# Patient Record
Sex: Female | Born: 1935
Health system: Southern US, Community
[De-identification: ages and names within clinical notes are randomized; demographics above are authoritative.]

## PROBLEM LIST (undated history)

## (undated) DIAGNOSIS — I471 Supraventricular tachycardia, unspecified: Secondary | ICD-10-CM

## (undated) DIAGNOSIS — R600 Localized edema: Secondary | ICD-10-CM

## (undated) DIAGNOSIS — G4733 Obstructive sleep apnea (adult) (pediatric): Secondary | ICD-10-CM

## (undated) DIAGNOSIS — G4730 Sleep apnea, unspecified: Secondary | ICD-10-CM

## (undated) DIAGNOSIS — R0609 Other forms of dyspnea: Secondary | ICD-10-CM

## (undated) DIAGNOSIS — R609 Edema, unspecified: Secondary | ICD-10-CM

## (undated) DIAGNOSIS — I700 Atherosclerosis of aorta: Secondary | ICD-10-CM

## (undated) DIAGNOSIS — I38 Endocarditis, valve unspecified: Secondary | ICD-10-CM

## (undated) DIAGNOSIS — I2510 Atherosclerotic heart disease of native coronary artery without angina pectoris: Secondary | ICD-10-CM

## (undated) DIAGNOSIS — I480 Paroxysmal atrial fibrillation: Secondary | ICD-10-CM

## (undated) DIAGNOSIS — I779 Disorder of arteries and arterioles, unspecified: Secondary | ICD-10-CM

## (undated) DIAGNOSIS — K2270 Barrett's esophagus without dysplasia: Secondary | ICD-10-CM

## (undated) DIAGNOSIS — H3530 Unspecified macular degeneration: Secondary | ICD-10-CM

## (undated) DIAGNOSIS — M419 Scoliosis, unspecified: Secondary | ICD-10-CM

## (undated) DIAGNOSIS — M1990 Unspecified osteoarthritis, unspecified site: Secondary | ICD-10-CM

## (undated) DIAGNOSIS — I10 Essential (primary) hypertension: Secondary | ICD-10-CM

## (undated) DIAGNOSIS — K219 Gastro-esophageal reflux disease without esophagitis: Secondary | ICD-10-CM

## (undated) DIAGNOSIS — N189 Chronic kidney disease, unspecified: Secondary | ICD-10-CM

## (undated) DIAGNOSIS — D649 Anemia, unspecified: Secondary | ICD-10-CM

## (undated) DIAGNOSIS — D126 Benign neoplasm of colon, unspecified: Secondary | ICD-10-CM

## (undated) DIAGNOSIS — K222 Esophageal obstruction: Secondary | ICD-10-CM

## (undated) DIAGNOSIS — J449 Chronic obstructive pulmonary disease, unspecified: Secondary | ICD-10-CM

## (undated) DIAGNOSIS — K449 Diaphragmatic hernia without obstruction or gangrene: Secondary | ICD-10-CM

## (undated) DIAGNOSIS — I4891 Unspecified atrial fibrillation: Secondary | ICD-10-CM

## (undated) DIAGNOSIS — E785 Hyperlipidemia, unspecified: Secondary | ICD-10-CM

## (undated) DIAGNOSIS — R0600 Dyspnea, unspecified: Secondary | ICD-10-CM

## (undated) DIAGNOSIS — Z7901 Long term (current) use of anticoagulants: Secondary | ICD-10-CM

## (undated) DIAGNOSIS — N183 Chronic kidney disease, stage 3 unspecified: Secondary | ICD-10-CM

## (undated) DIAGNOSIS — I739 Peripheral vascular disease, unspecified: Secondary | ICD-10-CM

## (undated) HISTORY — DX: Peripheral vascular disease, unspecified: I73.9

## (undated) HISTORY — PX: HEMORRHOID SURGERY: SHX153

## (undated) HISTORY — PX: COLONOSCOPY: SHX174

## (undated) HISTORY — PX: CARDIAC CATHETERIZATION: SHX172

## (undated) HISTORY — PX: TONSILLECTOMY: SUR1361

## (undated) HISTORY — DX: Hyperlipidemia, unspecified: E78.5

## (undated) HISTORY — DX: Sleep apnea, unspecified: G47.30

---

## 1978-02-05 HISTORY — PX: APPENDECTOMY: SHX54

## 1978-02-05 HISTORY — PX: ABDOMINAL HYSTERECTOMY: SHX81

## 1997-02-05 HISTORY — PX: LUNG BIOPSY: SHX232

## 1998-02-05 HISTORY — PX: BREAST SURGERY: SHX581

## 1999-09-27 ENCOUNTER — Encounter: Payer: Self-pay | Admitting: Thoracic Surgery

## 1999-09-27 ENCOUNTER — Encounter: Admission: RE | Admit: 1999-09-27 | Discharge: 1999-09-27 | Payer: Self-pay | Admitting: Thoracic Surgery

## 2000-09-25 ENCOUNTER — Encounter: Payer: Self-pay | Admitting: Thoracic Surgery

## 2000-09-25 ENCOUNTER — Encounter: Admission: RE | Admit: 2000-09-25 | Discharge: 2000-09-25 | Payer: Self-pay | Admitting: Thoracic Surgery

## 2001-09-17 ENCOUNTER — Encounter: Admission: RE | Admit: 2001-09-17 | Discharge: 2001-09-17 | Payer: Self-pay | Admitting: Thoracic Surgery

## 2001-09-17 ENCOUNTER — Encounter: Payer: Self-pay | Admitting: Thoracic Surgery

## 2002-10-14 ENCOUNTER — Encounter: Admission: RE | Admit: 2002-10-14 | Discharge: 2002-10-14 | Payer: Self-pay | Admitting: Thoracic Surgery

## 2002-10-14 ENCOUNTER — Encounter: Payer: Self-pay | Admitting: Thoracic Surgery

## 2003-10-14 ENCOUNTER — Encounter: Admission: RE | Admit: 2003-10-14 | Discharge: 2003-10-14 | Payer: Self-pay | Admitting: Thoracic Surgery

## 2004-02-06 HISTORY — PX: CORONARY ARTERY BYPASS GRAFT: SHX141

## 2004-04-05 DIAGNOSIS — Z951 Presence of aortocoronary bypass graft: Secondary | ICD-10-CM

## 2004-04-05 HISTORY — DX: Presence of aortocoronary bypass graft: Z95.1

## 2004-04-20 ENCOUNTER — Inpatient Hospital Stay: Payer: Self-pay | Admitting: Internal Medicine

## 2004-04-21 ENCOUNTER — Inpatient Hospital Stay (HOSPITAL_COMMUNITY): Admission: AD | Admit: 2004-04-21 | Discharge: 2004-04-29 | Payer: Self-pay | Admitting: *Deleted

## 2004-04-21 DIAGNOSIS — I2510 Atherosclerotic heart disease of native coronary artery without angina pectoris: Secondary | ICD-10-CM

## 2004-04-21 HISTORY — DX: Atherosclerotic heart disease of native coronary artery without angina pectoris: I25.10

## 2004-05-25 ENCOUNTER — Encounter: Admission: RE | Admit: 2004-05-25 | Discharge: 2004-05-25 | Payer: Self-pay | Admitting: Cardiothoracic Surgery

## 2004-06-13 ENCOUNTER — Encounter: Payer: Self-pay | Admitting: Internal Medicine

## 2004-07-06 ENCOUNTER — Encounter: Payer: Self-pay | Admitting: Internal Medicine

## 2004-08-02 ENCOUNTER — Ambulatory Visit: Payer: Self-pay | Admitting: Internal Medicine

## 2004-08-05 ENCOUNTER — Encounter: Payer: Self-pay | Admitting: Internal Medicine

## 2004-10-17 ENCOUNTER — Encounter: Admission: RE | Admit: 2004-10-17 | Discharge: 2004-10-17 | Payer: Self-pay | Admitting: Thoracic Surgery

## 2005-04-17 ENCOUNTER — Encounter: Admission: RE | Admit: 2005-04-17 | Discharge: 2005-04-17 | Payer: Self-pay | Admitting: Thoracic Surgery

## 2005-11-07 ENCOUNTER — Encounter: Admission: RE | Admit: 2005-11-07 | Discharge: 2005-11-07 | Payer: Self-pay | Admitting: Thoracic Surgery

## 2005-11-13 ENCOUNTER — Ambulatory Visit: Payer: Self-pay | Admitting: Family Medicine

## 2005-12-19 ENCOUNTER — Ambulatory Visit: Payer: Self-pay | Admitting: Gastroenterology

## 2005-12-19 DIAGNOSIS — K5790 Diverticulosis of intestine, part unspecified, without perforation or abscess without bleeding: Secondary | ICD-10-CM | POA: Insufficient documentation

## 2006-11-13 ENCOUNTER — Encounter: Admission: RE | Admit: 2006-11-13 | Discharge: 2006-11-13 | Payer: Self-pay | Admitting: Thoracic Surgery

## 2006-11-13 ENCOUNTER — Ambulatory Visit: Payer: Self-pay | Admitting: Thoracic Surgery

## 2007-01-14 ENCOUNTER — Ambulatory Visit: Payer: Self-pay | Admitting: Gastroenterology

## 2007-03-09 IMAGING — CR DG CHEST 1V PORT
1 series · 1 of 1 positions shown · non-contrast
Comparison: 04/24/04.

CLINICAL DATA: Status post CABG.
 CHEST PORTABLE ? 1 VIEW, 04/25/04 AT 1771 HOURS:

[view not recorded]
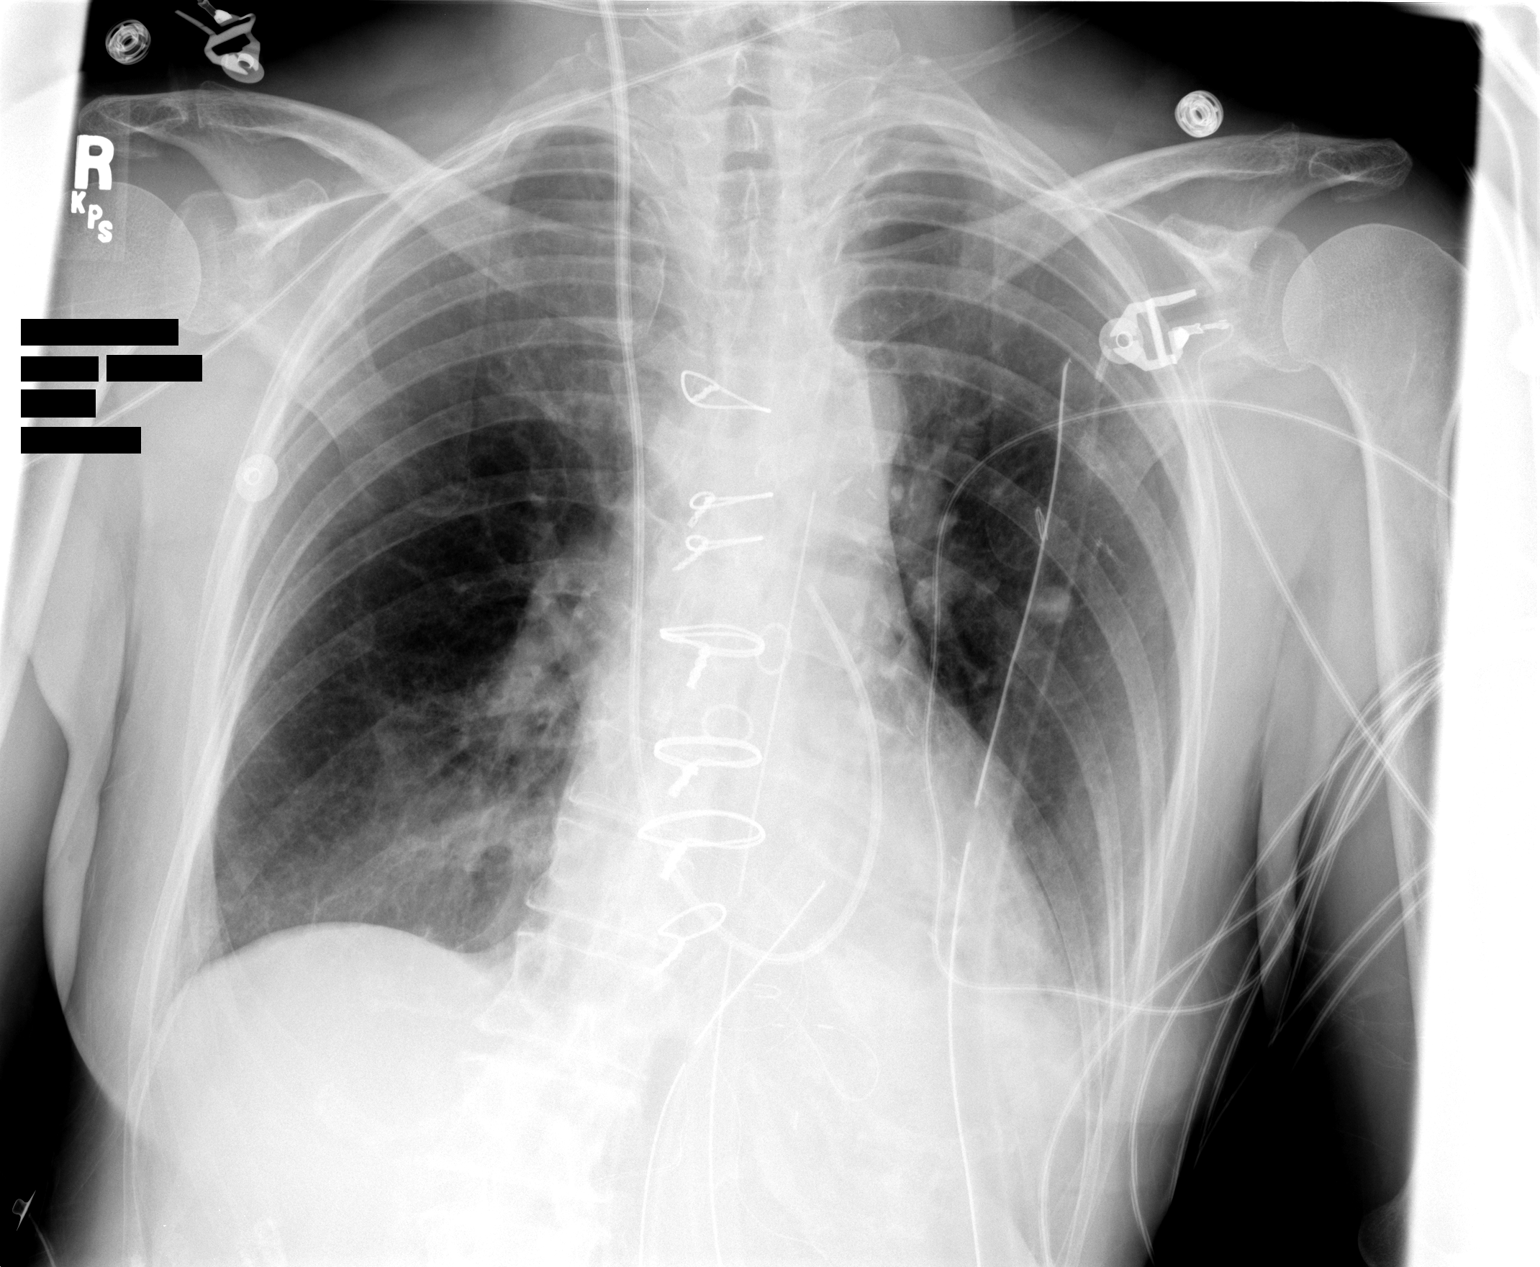

[1 of 1 positions shown; findings below may reference images not displayed]

The patient has been extubated.  There is interval decrease in left lower lobe atelectasis.  Left chest tube is in place.  There likely is a tiny less than 5% left apical pneumothorax present.  Swan-Ganz catheter position is stable.
IMPRESSION: Decrease in left lower lobe atelectasis.  Probable tiny less than 5% left apical pneumothorax.

## 2007-03-10 IMAGING — CR DG CHEST 1V PORT
1 series · 1 of 1 positions shown · non-contrast
Comparison: 04/25/04.

CLINICAL DATA: Unstable angina--post CABG.  
 CHEST PORTABLE 1 VIEW:

[view not recorded]
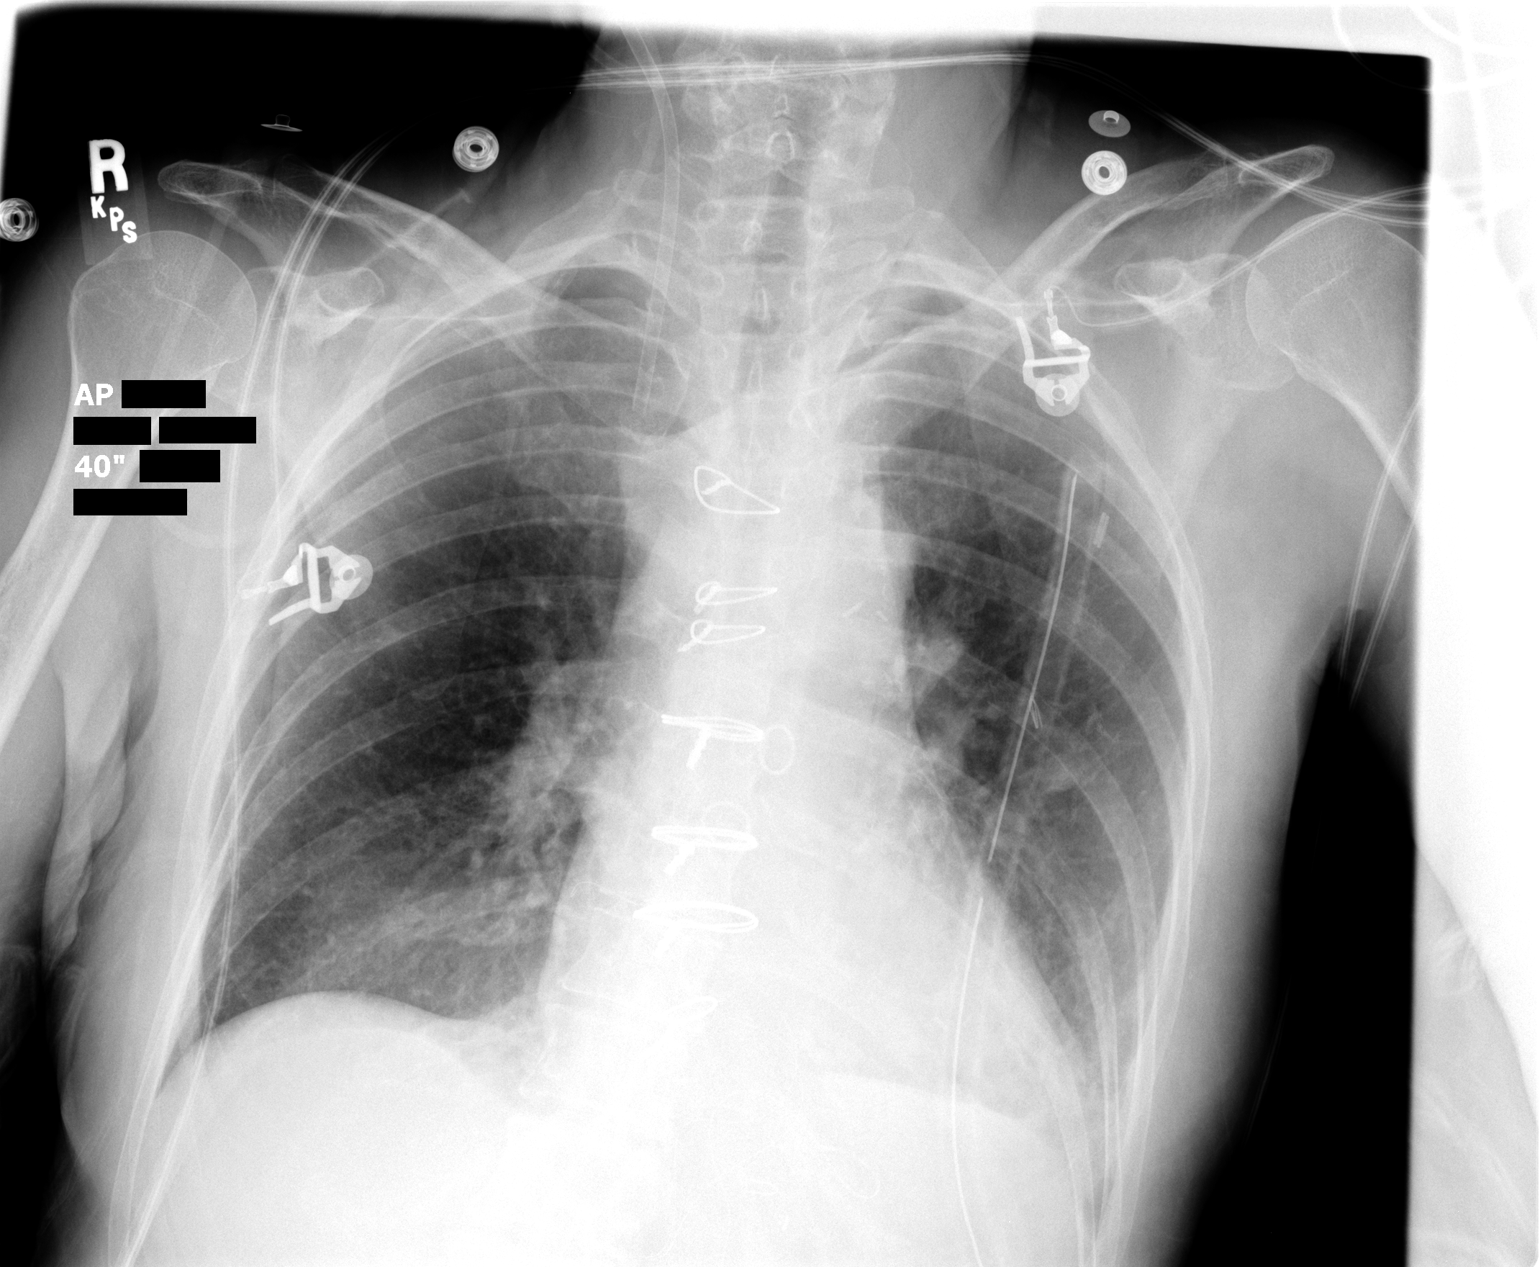

[1 of 1 positions shown; findings below may reference images not displayed]

Papwla Miziri and midline drains removed.  Left chest tube remains in place.  No definite pneumothorax.  Mild bibasilar atelectasis.
IMPRESSION: Satisfactory postoperative chest x-ray--currently no pneumothorax.

## 2007-11-13 ENCOUNTER — Ambulatory Visit: Payer: Self-pay | Admitting: Thoracic Surgery

## 2007-11-13 ENCOUNTER — Encounter: Admission: RE | Admit: 2007-11-13 | Discharge: 2007-11-13 | Payer: Self-pay | Admitting: Thoracic Surgery

## 2008-11-17 ENCOUNTER — Ambulatory Visit: Payer: Self-pay | Admitting: Thoracic Surgery

## 2008-11-17 ENCOUNTER — Encounter: Admission: RE | Admit: 2008-11-17 | Discharge: 2008-11-17 | Payer: Self-pay | Admitting: Thoracic Surgery

## 2009-03-07 ENCOUNTER — Ambulatory Visit: Payer: Self-pay | Admitting: Anesthesiology

## 2010-02-01 ENCOUNTER — Ambulatory Visit: Payer: Self-pay | Admitting: Unknown Physician Specialty

## 2010-02-10 ENCOUNTER — Ambulatory Visit: Payer: Self-pay | Admitting: Unknown Physician Specialty

## 2010-02-25 ENCOUNTER — Encounter: Payer: Self-pay | Admitting: Thoracic Surgery

## 2010-02-26 ENCOUNTER — Encounter: Payer: Self-pay | Admitting: Thoracic Surgery

## 2010-05-19 ENCOUNTER — Encounter (HOSPITAL_COMMUNITY): Payer: Self-pay | Admitting: Radiology

## 2010-05-19 ENCOUNTER — Emergency Department (HOSPITAL_COMMUNITY)
Admission: EM | Admit: 2010-05-19 | Discharge: 2010-05-19 | Disposition: A | Discharge status: Home / Self Care | Payer: Medicare Other | Attending: Emergency Medicine | Admitting: Emergency Medicine

## 2010-05-19 ENCOUNTER — Emergency Department (HOSPITAL_COMMUNITY): Payer: Medicare Other

## 2010-05-19 DIAGNOSIS — I10 Essential (primary) hypertension: Secondary | ICD-10-CM | POA: Insufficient documentation

## 2010-05-19 DIAGNOSIS — R05 Cough: Secondary | ICD-10-CM | POA: Insufficient documentation

## 2010-05-19 DIAGNOSIS — R6889 Other general symptoms and signs: Secondary | ICD-10-CM | POA: Insufficient documentation

## 2010-05-19 DIAGNOSIS — J029 Acute pharyngitis, unspecified: Secondary | ICD-10-CM | POA: Insufficient documentation

## 2010-05-19 DIAGNOSIS — R059 Cough, unspecified: Secondary | ICD-10-CM | POA: Insufficient documentation

## 2010-05-19 HISTORY — DX: Essential (primary) hypertension: I10

## 2010-05-19 LAB — DIFFERENTIAL
Basophils Relative: 0 % (ref 0–1)
Eosinophils Absolute: 0.2 10*3/uL (ref 0.0–0.7)
Monocytes Relative: 9 % (ref 3–12)
Neutrophils Relative %: 77 % (ref 43–77)

## 2010-05-19 LAB — BASIC METABOLIC PANEL
Calcium: 8.8 mg/dL (ref 8.4–10.5)
Chloride: 104 mEq/L (ref 96–112)
Creatinine, Ser: 1.15 mg/dL (ref 0.4–1.2)
GFR calc Af Amer: 56 mL/min — ABNORMAL LOW (ref >60)

## 2010-05-19 LAB — RAPID STREP SCREEN (MED CTR MEBANE ONLY): Streptococcus, Group A Screen (Direct): NEGATIVE

## 2010-05-19 LAB — CBC
MCH: 33.9 pg (ref 26.0–34.0)
Platelets: 215 10*3/uL (ref 150–400)
RBC: 3.66 MIL/uL — ABNORMAL LOW (ref 3.87–5.11)

## 2010-05-19 MED ORDER — IOHEXOL 300 MG/ML  SOLN: 75.0000 mL | Freq: Once | INTRAMUSCULAR | Status: DC | PRN

## 2010-06-20 NOTE — Letter (Signed)
November 17, 2008   Lorie Phenix, MD  [address], [address]  [place], [place] [address]   Re:  Christina [name], Christina [name]                 DOB:  1935-09-30   Dear Dr. Elease Hashimoto:   I saw the patient for followup today.  Her chest x-ray showed no  evidence of any changes.  She is doing well overall.  The one granuloma  in the left upper lobe is unchanged and has been that way for several  years.  She is also stable from a cardiac standpoint.  Her blood  pressure is 135/81, pulse 70, respirations 16, sats were 95%.  We  enjoyed taking care of the patient, but we have to see her again if she  develops any further pulmonary problems.   Ines Bloomer, M.D.  Electronically Signed   DPB/MEDQ  D:  11/17/2008  T:  11/18/2008  Job:  [id number]

## 2010-06-20 NOTE — Assessment & Plan Note (Signed)
OFFICE VISIT   KORAL, THADEN  DOB:  06-26-1935                                        November 13, 2007  CHART #:  [id number]   She has been stable on all the medical problems since then.  We reviewed  her medical list.  Blood pressure 140/80, pulse 90, respirations 18, and  sats were 94%.  Chest x-ray showed a stable 12-mm left lower lobe nodule  that is granuloma-like element that we removed 14 years ago.  She also  has some mild cardiomegaly and a hiatal hernia.  I will see her again in  1 year with another chest x-ray and that should be the last followup.   Ines Bloomer, M.D.  Electronically Signed   DPB/MEDQ  D:  11/13/2007  T:  11/13/2007  Job:  [id number]

## 2010-06-20 NOTE — Letter (Signed)
November 13, 2006   Dorothyann Peng, M.D.  [address]  [address]  [place], [place]  [address]   Re:  Christina [name], Christina [name]                 DOB:  18-Aug-1935   Dear Dr.Callwood:   I saw [name] back for follow up of her left upper lobe nodule.  She is doing well. There is no change in her chest x-ray. Apparently,  her cardiac status is stable and her recent heart evaluation was also  good. Her blood pressure is 132/78, pulse 78, respirations 18,  saturations 96%, and I plan to see her back again in a year with another  chest x-ray. Her lungs were clear to auscultation and percussion.   Ines Bloomer, M.D.  Electronically Signed   DPB/MEDQ  D:  11/13/2006  T:  11/14/2006  Job:  [id number]

## 2010-06-23 NOTE — Consult Note (Signed)
NAME:  Christina [name], Christina [name]           ACCOUNT NO.:  [id number]   MEDICAL RECORD NO.:  [id number]          PATIENT TYPE:  INP   LOCATION:  2920                         FACILITY:  [facility]   PHYSICIAN:  Salvatore Decent. Cornelius Moras, M.D. DATE OF BIRTH:  04/10/1935   DATE OF CONSULTATION:  04/22/2004  DATE OF DISCHARGE:                                   CONSULTATION   REFERRING PHYSICIAN:  Darlin Priestly, M.D.   PHYSICIANS:  1.  Dorothyann Peng, M.D.  2.  Lorie Phenix, M.D.   REASON FOR CONSULTATION:  Severe three-vessel coronary artery disease with  class IV unstable angina.   HISTORY OF PRESENT ILLNESS:  Christina [name] is a 75 year old married white  female from [place] with no previous history of coronary artery disease  but risk factors notable for history of hyperlipidemia, longstanding tobacco  abuse, strong family history of coronary artery disease, and recently  diagnosed hypertension. The patient states that she has been in her usual  state of health until the last few months. She has developed worsening  exertional fatigue. Over the last week or so, she developed some pressure-  like substernal chest pain that occasionally radiates to her arm. Initially,  this seemed to be associated with physical activity and relieved by rest.  The symptoms have accelerated frequency, and the patient subsequently  developed some episodes occurring at rest. The chest pain is not associated  with shortness of breath, nausea or diaphoresis. It is described as a  pressure-like pain in the mid chest radiating across the left side. She  initially presented to the emergency room at [facility] in [place]. She was admitted to the hospital and ruled out for  acute myocardial infarction. The patient was taken for cardiac  catheterization by Dr. Dorothyann Peng on March17, 2006. Findings at the  time of catheterization demonstrates severe three-vessel coronary artery  disease with  normal left ventricular function. Christina [name] was  subsequently transferred to [facility] for further  management. She was evaluated again by Dr. Lenise Herald and her coronary  anatomy is felt to be relatively unfavorable for percutaneous coronary  intervention. Cardiac surgical consultation has been requested.   REVIEW OF SYSTEMS:  GENERAL:  The patient reports feeling well with  exception of progressive exertional fatigue in recent months. She states  that she has an excellent appetite, although she has lost weight recently.  She apparently has lost between five and seven pounds over the last six  months to a year. She has always been relatively thin. CARDIOVASCULAR:  Notable for symptoms of angina as described. The patient denies resting  shortness of breath, PND, orthopnea, or lower extremity edema. She has not  had palpitations or syncope. RESPIRATORY:  Negative. The patient denies  productive cough, hemoptysis, wheezing. GASTROINTESTINAL:  Negative. The  patient has no difficulty swallowing. She denies symptoms of reflux. She  reports normal bowel function, although she has not had bowel movement thus  far since hospital admission. She denies history of hematochezia,  hematemesis, melena. MUSCULOSKELETAL: Notable for chronic problems with  arthritis and arthralgias particularly afflicting her  hands and occasionally  her lower back. NEUROLOGICAL:  Negative. The patient denies symptoms  suggestive of previous TIA or stroke. GENITOURINARY:  Negative. INFECTIOUS:  Negative. HEENT: Negative. The patient wears glasses. She has partial  dentures. She has no loose teeth or other problems. PSYCHIATRIC:  Negative.   PAST MEDICAL HISTORY:  1.  Hypertension, recently diagnosed.  2.  Osteoarthritis.  3.  Longstanding tobacco abuse.  4.  Iron deficient anemia.  5.  History of benign pulmonary nodules, followed by Dr. Ines Bloomer.  6.  Hyperlipidemia.   PAST  SURGICAL HISTORY:  1.  Left VATs by Dr. Ines Bloomer in 1995 for resection of benign      pulmonary nodule.  2.  Hysterectomy.  3.  Multiple breast biopsies.   FAMILY HISTORY:  The patient has numerous family members with coronary  artery disease. Her father died of myocardial infarction in his 44s and his  mother suffered a myocardial infarction in her 57s.   SOCIAL HISTORY:  The patient is married and lives with her husband in  [place]. 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 The patient has a longstanding history of tobacco use smoking  between one half to one pack of cigarettes per day for more than 50 years.  She denies history of significant alcohol consumption.   MEDICATIONS PRIOR TO ADMISSION:  1.  Etodolac 400 mg twice daily.  2.  Enteric coated aspirin 81 mg daily.  3.  Calcium supplement.  4.  Multivitamin.  5.  Iron.  6.  Vitamin C.   ALLERGIES:  CODEINE and SULFA both cause nausea.   PHYSICAL EXAMINATION:  GENERAL:  The patient is a thin well-appearing white  female who appears stated age in no acute distress.  VITAL SIGNS:  She is currently afebrile and normotensive and in normal sinus  rhythm.  HEENT: Exam is grossly unremarkable.  NECK:  The neck is supple. There is no cervical or supraclavicular  lymphadenopathy. No jugular venous distension. No carotid bruits noted.  CHEST:  Auscultation of the chest reveals clear and symmetrical breath  sounds bilaterally. No wheezes or rhonchi demonstrated.  CARDIOVASCULAR: Exam includes regular rate and rhythm. No murmurs, rubs or  gallops are noted.  ABDOMEN:  The abdomen is flat, soft, nontender. There are no palpable  masses. Bowel sounds present. EXTREMITIES: Warm and well-perfused. There are  no lower extremity edema. There is no sign of significant venous  insufficiency. Distal pulses are diminished but palpable in the dorsalis  pedis position.  RECTAL:   Deferred. GENITOURINARY:  Deferred.  NEUROLOGIC: Examination is grossly nonfocal and symmetrical throughout.   LABORATORY DATA:  Baseline blood work is notable for hemoglobin 11.4 with  hematocrit of 31.9%, platelet count 101,000. White blood count 6900. Serum  electrolytes are within normal limits and baseline serum creatinine 1.0.  Liver function profile is within normal limits and baseline coagulation  profile was normal.   Cardiac catheterization performed by Dr. Sanda Linger on March17, 2006 is  reviewed. This demonstrates severe three-vessel coronary artery disease with  normal left ventricular function. Specifically, there is heavy calcification  involving the proximal left anterior descending coronary artery and the  proximal left circumflex coronary artery. There is 80% proximal stenosis of  the left anterior descending coronary artery. This is a long complex lesion  with heavy calcification. There is 70-80% proximal stenosis of left  circumflex coronary artery. There is 75% ostial stenosis of the right  coronary  artery with long segment 50% proximal stenosis of the right  coronary artery. There is right dominant coronary circulation. Left  ventricular function appears normal with ejection fraction estimated 65%.   IMPRESSION:  Severe three-vessel coronary artery disease with class IV  unstable angina and normal left ventricular function. I believe that Mrs.  Horan would best be treated by elective coronary artery bypass grafting.   PLAN:  I have outlined options at length with Mrs. Berdan and husband.  Alternative treatment strategies have been discussed. They understand and  accept all associated risks of surgery including but not limited to risk of  death, stroke, myocardial infarction, congestive heart failure, respiratory  failure, pneumonia, bleeding requiring blood transfusion, arrhythmia,  infection, and recurrent coronary artery disease. All the questions been   addressed. We tentatively plan to proceed with surgery later this week.      CHO/MEDQ  D:  04/22/2004  T:  04/22/2004  Job:  [id number]   cc:   Darlin Priestly, MD  [address], [address]  [place]  [place] [address]  Fax: [phone]   Bufford Lope  [address]  [place]  [place] [address]  Fax: [phone]   Evonnie Pat, M.D.

## 2010-06-23 NOTE — Discharge Summary (Signed)
NAME:  Christina [name], Christina [name] NO.:  [id number]   MEDICAL RECORD NO.:  [id number]          PATIENT TYPE:  INP   LOCATION:  2007                         FACILITY:  [facility]   PHYSICIAN:  Sheliah Plane, MD    DATE OF BIRTH:  1935-02-23   DATE OF ADMISSION:  04/21/2004  DATE OF DISCHARGE:  04/27/2004                                 DISCHARGE SUMMARY   ADMISSION DATE:  Severe three-vessel coronary artery disease with class IV  unstable angina.   PAST MEDICAL HISTORY/DISCHARGE DIAGNOSES:  1.  Hypertension.  2.  Osteoarthritis.  3.  Long-standing tobacco abuse.  4.  Iron deficiency anemia.  5.  History of benign pulmonary nodules followed by Dr. Ines Bloomer.  6.  Hyperlipidemia.  7.  Newly diagnosed adult-onset diabetes mellitus.  8.  Coronary artery disease status post coronary artery bypass grafting x3.  9.  Status post left VATS by Dr. Edwyna Shell in 1995 for resection of benign      pulmonary nodules.  10. Status post hysterectomy.  11. Multiple breast biopsies.   ALLERGIES:  CODEINE and SULFA, both of which cause nausea.   HISTORY OF PRESENT ILLNESS:  The patient is a 75 year old Caucasian female  with no previous history of coronary artery disease but multiple risk  factors.  The patient presented to [facility] on April 21, 2004 with angina-like symptoms.  The patient was admitted and ruled out  for acute myocardial infarction.  She was taken for a cardiac  catheterization by Dr. Dorothyann Peng on April 21, 2004.  Cardiac  catheterization revealed severe three-vessel coronary artery disease with a  normal left ventricular function.  The patient was then transferred to [facility] for further management of her coronary artery disease and  possible arthroplasty.   HOSPITAL COURSE:  The patient was admitted to [facility] on April 21, 2004 for further management of her newly diagnosed three-vessel coronary  artery disease as  previously stated.  After review of her films it was Dr.  Mikey Bussing opinion that the patient would be better treated with coronary  artery bypass grafting.  The CVT'S service was subsequently consulted  regarding surgical revascularization.  After review of the patient and her  films it was Dr. Orvan July opinion that the patient should proceed with  coronary artery bypass graft surgery.   The patient was maintained on routine hospital care prior to surgery.  The  patient was taken to the OR on April 24, 2004 for coronary artery bypass  grafting x3.  The left internal mammary artery was grafted to the LAD, the  saphenous vein was grafted to the diagonal and saphenous vein was grafted to  the right coronary artery.  Endoscopic vessel harvesting was performed on  the right thigh and open vein harvest was performed on the left lower leg.  The patient tolerated the procedure well and was hemodynamically stable  immediately postoperatively.  The patient was transferred from the OR to the  SICU in stable condition.  The patient was extubated without complication  and woke up from anesthesia neurologically intact.  Secondary to her chronic  iron deficiency anemia the patient had been transfused with platelets  postoperatively.   The patient's postoperative course has progressed as expected with the  exception of a new diagnosis of adult-onset diabetes mellitus.  The  patient's lab glucose was 152 and this, of course, indicated further  investigation.  The patient's blood sugars had remained elevated throughout  the postoperative course and has been treated with sliding scale insulin and  modified diet.  The patient will be instructed to follow up her newly  diagnosed diabetes mellitus with her primary care physician.  At this time  she only requires diet modification and no medication.  She will be given a  prescription for a blood glucose meter prior to discharge.   On postoperative day one, the  patient was afebrile with stable vital signs  and maintaining a normal sinus rhythm.  Her invasive lines and chest tubes  were discontinued in a routine manner.  The patient began cardiac rehab on  postoperative day one as well and has increased her level to a satisfactory  manner.  On postoperative day two, the patient was transfused with a unit of  packed rbc's secondary to a hematocrit of 26.5.   The remainder of the patient's postoperative course has been unremarkable.  On postoperative day three, her only complaint is of mild nausea.  She was  afebrile with stable vital signs and she is maintaining a normal sinus  rhythm.  The patient has been volume-overloaded postoperatively and is being  diuresed accordingly.   The physical exam shows cardiac regular rate and rhythm, lungs reveal faint  crackles in the left base, the abdomen is benign.  The incisions are clean,  dry and intact and there is 1+ pitting in the left lower extremity.  The  patient has persistent iron deficiency anemia and she is being maintained on  iron and folic acid throughout the postoperative course.  She has received  several blood transfusions pre and postoperatively and this is improving and  is stable with a hemoglobin of 8.7 at this time.  She is ambulating well and  is in stable condition at this time.  As long as she continues to progress  in the current manner she will be ready for discharge in the next 1-2 days  pending morning round reevaluation.   LABORATORY DATA:  Hemoglobin and hematocrit on April 27, 2004 8.7 and 25.7,  white count 10.9, platelets 133.  BNP on April 27, 2004 sodium 132,  potassium 3.7, BUN 28, creatinine 1.1, glucose 123.   CONDITION ON DISCHARGE:  Improved.   DISCHARGE INSTRUCTIONS:  1.  Medications: Aspirin 325 mg q.d.  Toprol XL 25 mg q.d.  Lipitor 20 mg      q.d.  Folic acid 1 mg q.d.  Iron supplement 325 mg q.d.  Lasix 20 mg     q.d. x5 days.  K-Dur 10 mEq q.d. x5 days.   Tylox 1-2 p.o. q.4-6h. p.r.n.      pain.  2.  Activity:  No driving, no lifting of more than 10 pounds and the patient      is continue daily breathing and walking exercises.  3.  Diet:  Low salt, low fat and carbohydrate-modified medium calorie with      further instruction for diabetes teaching as an outpatient.  4.  Wound care:  The patient may shower daily and clean the incisions with      soap and water.  If  wound problems arise the patient should contact the      CTVS office at [phone].  5.  Followup appointment with Dr. Dorothyann Peng.  The patient will be      instructed to contact his office for an appointment two weeks after      discharge.  6.  Dr. Tyrone Sage, May 18, 2004 at 11:20 a.m.  The patient should go to      [facility] at 10:20 a.m. on April 17, 2004 to have a      chest x-ray taken in which she will be bring with her to the appointment      with Dr. Tyrone Sage.      AY/MEDQ  D:  04/27/2004  T:  04/28/2004  Job:  [id number]   cc:   Dorothyann Peng, M.D.  [place], [place]

## 2010-06-23 NOTE — Op Note (Signed)
Christina [name], [name] NO.:  [id number]   MEDICAL RECORD NO.:  [id number]          PATIENT TYPE:  INP   LOCATION:  2307                         FACILITY:  [facility]   PHYSICIAN:  Sheliah Plane, MD    DATE OF BIRTH:  1935-08-27   DATE OF PROCEDURE:  04/25/2004  DATE OF DISCHARGE:                                 OPERATIVE REPORT   PREOPERATIVE DIAGNOSIS:  Coronary occlusive disease.   POSTOPERATIVE DIAGNOSIS:  Coronary occlusive disease.   SURGICAL PROCEDURE:  Coronary artery bypass grafting x 3 with the left  internal mammary to the left anterior descending coronary artery, reverse  saphenous vein graft to the distal right coronary artery, reverse saphenous  vein graft to the first obtuse marginal coronary artery with attempted  Endovein harvesting.   SURGEON:  Dr. Tyrone Sage   FIRST ASSISTANT:  Dr. Laneta Simmers   SECOND ASSISTANT:  Christina [name], P.A.   BRIEF HISTORY:  The patient is a 75 year old female, who is admitted with  unstable anginal symptoms.  She was transferred to [facility] for  possible arthroplasty; however, after reviewing the films by the  cardiologist, it was felt that coronary artery bypass grafting would be more  appropriate.  The patient was seen in consultation, and bypass surgery was  agreed as the best option and discussed with the patient, who agreed to sign  informed consent.  She had previously had bilateral VATS procedures for  granulomatous disease by Dr. Edwyna [name] approximately 10 years ago.  She was  also noted to have unusual antibody; however, because of the patient's  symptoms and significant three-vessel disease and consultation with  pathology, it was felt that she could safely be transfused.  Her hematocrit  preoperatively was 28-30.  After signing informed consent, the patient  agreed to surgery.   DESCRIPTION OF PROCEDURE:  With Swan-Ganz and arterial line monitors in  place, the patient underwent general endotracheal  anesthesia without  incident.  Skin of the chest and legs were prepped with Betadine and draped  in usual sterile manner.  Initially, using [employer] Endovein harvesting  system, a segment of vein was harvested from the right thigh.  After removal  of this though, it became apparent that the vein did not distend; it was not  of suitable quality.  Additional vein was harvested with open technique from  the left calf and was reasonable quality, though slightly small.  Median  sternotomy was performed, and the left internal mammary artery was dissected  down as pedicle graft, and this artery was divided.  It had good, free flow  though.  Also like the vein and the patient's coronary vessels, the mammary  was also small.  The patient was systemically heparinized, and ascending  aorta and the right atrium were cannulated and aortic root vent cardioplegia  needle was introduced into the ascending aorta.  The patient was placed on  cardiopulmonary bypass, 2.4 Lpm per sq m.  Sites of anastomosis were  selected and dissected out of the epicardium.  The patient's body  temperature was cooled to 30 degrees.  Aortic cross-clamped, 500 mL of cold  blood  potassium cardioplegia was administered with rapid diastolic arrest of  the heart.  Myocardial septal temperature monitored throughout the cross-  clamp.  Attention was turned first to the OM 1 vessel which was open and was  approximately 1.2-1.3 mm in size.  Using a running 7-0 Prolene, distal  anastomosis was performed.  Attention was then turned to the distal right  coronary artery which was opened and using a running 7-0 Prolene, a distal  anastomosis was performed.  The left internal mammary artery was then  anastomosed to the distal left anterior descending coronary artery.  Both of  these vessels were small, 1.2 mm in size.  Using a running 8-0 Prolene,  distal anastomosis was performed.  With release of that bulldog on the  mammary artery, there  was appropriate rise in myocardial septal temperature.  Aortic cross-clamp was removed.  The patient spontaneously converted to a  sinus rhythm.  Partial occlusion clamp was placed on the ascending aorta.  Two punch aortotomies were performed.  Each of the two vein grafts were  anastomosed to the ascending aorta.  Air was evacuated from the grafts, and  the partial occlusion clamp was removed.  Sites of anastomosis were  inspected and were free of bleeding.  The patient was then ventilated and  weaned from cardiopulmonary bypass without difficulty.  She remained  hemodynamically stable.  She did require blood transfusion because of low  hematocrit and also platelet transfusion because of low platelets.  A left  pleural tube was left in place.  Pericardium was loosely reapproximated  after applying pacing wires and graft markers.  The sternum was then closed  with a #6 stainless steel wire.  Fascia closed with interrupted 0 Vicryl,  running 3-0 Vicryl in the subcutaneous tissue and 4-0 subcuticular stitch in  the skin, and dry dressings were applied.  Sponge and needle count was  reported as correct as the completion of the procedure.  The patient  tolerated the procedure without obvious complication and was transferred to  the surgical intensive care unit for further postoperative care.   It should also be noted that because of the patient's extremely small  coronary arteries and difficulty with vein harvesting, it is unlikely that  she would be a candidate for redo coronary artery bypass grafting.      EG/MEDQ  D:  04/25/2004  T:  04/25/2004  Job:  [id number]   cc:   Brayton El, MD  [facility]

## 2011-12-24 ENCOUNTER — Ambulatory Visit: Payer: Self-pay | Admitting: Family Medicine

## 2012-12-30 LAB — BASIC METABOLIC PANEL
BUN: 19 mg/dL (ref 4–21)
CREATININE: 1.1 mg/dL (ref 0.5–1.1)
GLUCOSE: 95 mg/dL
POTASSIUM: 5.1 mmol/L (ref 3.4–5.3)
Sodium: 143 mmol/L (ref 137–147)

## 2012-12-30 LAB — HEPATIC FUNCTION PANEL
ALT: 17 U/L (ref 7–35)
AST: 23 U/L (ref 13–35)
Alkaline Phosphatase: 86 U/L (ref 25–125)
Bilirubin, Total: 0.4 mg/dL

## 2013-01-12 LAB — HM DEXA SCAN

## 2013-07-07 DIAGNOSIS — K2270 Barrett's esophagus without dysplasia: Secondary | ICD-10-CM | POA: Insufficient documentation

## 2013-07-09 ENCOUNTER — Ambulatory Visit: Payer: Self-pay | Admitting: Unknown Physician Specialty

## 2013-07-20 ENCOUNTER — Ambulatory Visit: Payer: Self-pay | Admitting: Unknown Physician Specialty

## 2013-07-20 LAB — HM COLONOSCOPY

## 2013-07-22 LAB — PATHOLOGY REPORT

## 2013-07-27 DIAGNOSIS — D126 Benign neoplasm of colon, unspecified: Secondary | ICD-10-CM | POA: Insufficient documentation

## 2013-07-27 DIAGNOSIS — K449 Diaphragmatic hernia without obstruction or gangrene: Secondary | ICD-10-CM | POA: Insufficient documentation

## 2013-07-27 DIAGNOSIS — K2970 Gastritis, unspecified, without bleeding: Secondary | ICD-10-CM | POA: Insufficient documentation

## 2013-12-28 LAB — LIPID PANEL
CHOLESTEROL: 204 mg/dL — AB (ref 0–200)
HDL: 48 mg/dL (ref 35–70)
LDL Cholesterol: 78 mg/dL
TRIGLYCERIDES: 391 mg/dL — AB (ref 40–160)

## 2013-12-28 LAB — CBC AND DIFFERENTIAL
HEMATOCRIT: 41 % (ref 36–46)
HEMOGLOBIN: 13.5 g/dL (ref 12.0–16.0)
PLATELETS: 216 10*3/uL (ref 150–399)
WBC: 5.9 10*3/mL

## 2013-12-28 LAB — HEMOGLOBIN A1C: Hemoglobin A1C: 6

## 2013-12-28 LAB — TSH: TSH: 5.69 u[IU]/mL (ref 0.41–5.90)

## 2014-01-13 LAB — HM MAMMOGRAPHY

## 2014-02-12 DIAGNOSIS — H3532 Exudative age-related macular degeneration: Secondary | ICD-10-CM | POA: Diagnosis not present

## 2014-04-13 DIAGNOSIS — H35052 Retinal neovascularization, unspecified, left eye: Secondary | ICD-10-CM | POA: Diagnosis not present

## 2014-04-13 DIAGNOSIS — H3532 Exudative age-related macular degeneration: Secondary | ICD-10-CM | POA: Diagnosis not present

## 2014-04-20 DIAGNOSIS — H3532 Exudative age-related macular degeneration: Secondary | ICD-10-CM | POA: Diagnosis not present

## 2014-04-20 DIAGNOSIS — H35052 Retinal neovascularization, unspecified, left eye: Secondary | ICD-10-CM | POA: Diagnosis not present

## 2014-06-22 DIAGNOSIS — H35052 Retinal neovascularization, unspecified, left eye: Secondary | ICD-10-CM | POA: Diagnosis not present

## 2014-06-22 DIAGNOSIS — H3532 Exudative age-related macular degeneration: Secondary | ICD-10-CM | POA: Diagnosis not present

## 2014-10-15 ENCOUNTER — Other Ambulatory Visit: Payer: Self-pay | Admitting: Family Medicine

## 2014-10-15 DIAGNOSIS — M1990 Unspecified osteoarthritis, unspecified site: Secondary | ICD-10-CM

## 2014-10-15 NOTE — Telephone Encounter (Signed)
Last ov was on 12/18/13. Last time this prescription was refilled was on 04/07/2014.

## 2014-10-15 NOTE — Telephone Encounter (Signed)
Ok to call in rx.  Thanks.  

## 2014-10-19 ENCOUNTER — Other Ambulatory Visit: Payer: Self-pay | Admitting: Family Medicine

## 2014-10-19 DIAGNOSIS — M1990 Unspecified osteoarthritis, unspecified site: Secondary | ICD-10-CM

## 2014-10-19 NOTE — Telephone Encounter (Signed)
Last OV 12/2013  Thanks,   -Laura  

## 2014-10-19 NOTE — Telephone Encounter (Signed)
Ok to call in rx.  Thanks.  

## 2014-11-02 ENCOUNTER — Other Ambulatory Visit: Payer: Self-pay | Admitting: Family Medicine

## 2014-11-02 DIAGNOSIS — I10 Essential (primary) hypertension: Secondary | ICD-10-CM

## 2014-11-02 DIAGNOSIS — K219 Gastro-esophageal reflux disease without esophagitis: Secondary | ICD-10-CM | POA: Insufficient documentation

## 2014-11-02 NOTE — Telephone Encounter (Signed)
Pt will need AWV after 12/29/2014. Renaldo Fiddler, CMA

## 2014-12-04 DIAGNOSIS — M50322 Other cervical disc degeneration at C5-C6 level: Secondary | ICD-10-CM | POA: Diagnosis not present

## 2014-12-04 DIAGNOSIS — M542 Cervicalgia: Secondary | ICD-10-CM | POA: Diagnosis not present

## 2014-12-10 DIAGNOSIS — H353213 Exudative age-related macular degeneration, right eye, with inactive scar: Secondary | ICD-10-CM | POA: Diagnosis not present

## 2014-12-10 DIAGNOSIS — H353221 Exudative age-related macular degeneration, left eye, with active choroidal neovascularization: Secondary | ICD-10-CM | POA: Diagnosis not present

## 2014-12-10 DIAGNOSIS — H43813 Vitreous degeneration, bilateral: Secondary | ICD-10-CM | POA: Diagnosis not present

## 2014-12-18 DIAGNOSIS — J40 Bronchitis, not specified as acute or chronic: Secondary | ICD-10-CM | POA: Diagnosis not present

## 2015-01-17 DIAGNOSIS — Z1231 Encounter for screening mammogram for malignant neoplasm of breast: Secondary | ICD-10-CM | POA: Diagnosis not present

## 2015-01-20 DIAGNOSIS — H353221 Exudative age-related macular degeneration, left eye, with active choroidal neovascularization: Secondary | ICD-10-CM | POA: Diagnosis not present

## 2015-01-25 ENCOUNTER — Ambulatory Visit (INDEPENDENT_AMBULATORY_CARE_PROVIDER_SITE_OTHER): Payer: Medicare Other | Admitting: Family Medicine

## 2015-01-25 ENCOUNTER — Encounter: Payer: Self-pay | Admitting: Family Medicine

## 2015-01-25 VITALS — BP 132/70 | HR 84 | Temp 97.5°F | Resp 16 | Ht 65.0 in | Wt 173.0 lb

## 2015-01-25 DIAGNOSIS — N6019 Diffuse cystic mastopathy of unspecified breast: Secondary | ICD-10-CM | POA: Insufficient documentation

## 2015-01-25 DIAGNOSIS — R739 Hyperglycemia, unspecified: Secondary | ICD-10-CM | POA: Diagnosis not present

## 2015-01-25 DIAGNOSIS — R059 Cough, unspecified: Secondary | ICD-10-CM

## 2015-01-25 DIAGNOSIS — I2510 Atherosclerotic heart disease of native coronary artery without angina pectoris: Secondary | ICD-10-CM | POA: Insufficient documentation

## 2015-01-25 DIAGNOSIS — R918 Other nonspecific abnormal finding of lung field: Secondary | ICD-10-CM | POA: Diagnosis not present

## 2015-01-25 DIAGNOSIS — J449 Chronic obstructive pulmonary disease, unspecified: Secondary | ICD-10-CM | POA: Diagnosis not present

## 2015-01-25 DIAGNOSIS — Z0000 Encounter for general adult medical examination without abnormal findings: Secondary | ICD-10-CM

## 2015-01-25 DIAGNOSIS — E785 Hyperlipidemia, unspecified: Secondary | ICD-10-CM

## 2015-01-25 DIAGNOSIS — N952 Postmenopausal atrophic vaginitis: Secondary | ICD-10-CM | POA: Insufficient documentation

## 2015-01-25 DIAGNOSIS — G4700 Insomnia, unspecified: Secondary | ICD-10-CM | POA: Insufficient documentation

## 2015-01-25 DIAGNOSIS — R05 Cough: Secondary | ICD-10-CM | POA: Diagnosis not present

## 2015-01-25 DIAGNOSIS — Z1239 Encounter for other screening for malignant neoplasm of breast: Secondary | ICD-10-CM | POA: Diagnosis not present

## 2015-01-25 DIAGNOSIS — I10 Essential (primary) hypertension: Secondary | ICD-10-CM | POA: Diagnosis not present

## 2015-01-25 DIAGNOSIS — N959 Unspecified menopausal and perimenopausal disorder: Secondary | ICD-10-CM | POA: Diagnosis not present

## 2015-01-25 DIAGNOSIS — J9811 Atelectasis: Secondary | ICD-10-CM | POA: Diagnosis not present

## 2015-01-25 DIAGNOSIS — K449 Diaphragmatic hernia without obstruction or gangrene: Secondary | ICD-10-CM | POA: Diagnosis not present

## 2015-01-25 DIAGNOSIS — N183 Chronic kidney disease, stage 3 (moderate): Secondary | ICD-10-CM

## 2015-01-25 DIAGNOSIS — Z723 Lack of physical exercise: Secondary | ICD-10-CM | POA: Insufficient documentation

## 2015-01-25 DIAGNOSIS — R202 Paresthesia of skin: Secondary | ICD-10-CM | POA: Insufficient documentation

## 2015-01-25 DIAGNOSIS — R9389 Abnormal findings on diagnostic imaging of other specified body structures: Secondary | ICD-10-CM | POA: Insufficient documentation

## 2015-01-25 DIAGNOSIS — R911 Solitary pulmonary nodule: Secondary | ICD-10-CM | POA: Diagnosis not present

## 2015-01-25 DIAGNOSIS — N1831 Chronic kidney disease, stage 3a: Secondary | ICD-10-CM | POA: Insufficient documentation

## 2015-01-25 MED ORDER — TIOTROPIUM BROMIDE MONOHYDRATE 18 MCG IN CAPS: 18.0000 ug | ORAL_CAPSULE | Freq: Every day | RESPIRATORY_TRACT | Status: DC

## 2015-01-25 NOTE — Patient Instructions (Signed)
Please call the [facility] at [facility] to schedule this at [phone]   

## 2015-01-25 NOTE — Progress Notes (Signed)
Patient ID: Christina [name], female   DOB: March 12, 1935, 79 y.o.   MRN: [id number]        Patient: Christina [name], Female    DOB: 12-04-1935, 79 y.o.   MRN: [id number] Visit Date: 01/25/2015  Today's Provider: Margarita Rana, MD   Chief Complaint  Patient presents with  . Medicare Wellness  . COPD   Subjective:    Annual wellness visit Christina [name] is a 79 y.o. female. She feels well. She reports exercising none. She reports she is sleeping fairly well.  12/28/13 CPE 07/20/13 Colonoscopy 01/12/13 BMD-osteopenia  Lab Results  Component Value Date   WBC 5.9 12/28/2013   HGB 13.5 12/28/2013   HCT 41 12/28/2013   PLT 216 12/28/2013   GLUCOSE 106* 05/19/2010   CHOL 204* 12/28/2013   TRIG 391* 12/28/2013   HDL 48 12/28/2013   LDLCALC 78 12/28/2013   ALT 17 12/30/2012   AST 23 12/30/2012   NA 143 12/30/2012   K 5.1 12/30/2012   CL 104 05/19/2010   CREATININE 1.1 12/30/2012   BUN 19 12/30/2012   CO2 28 05/19/2010   TSH 5.69 12/28/2013   HGBA1C 6.0 12/28/2013   -----------------------------------------------------------   Follow up for bronchitis  The patient was last seen for this 1 months ago at [facility] in. Changes made at last visit include started on Pro air inhailer and prednisone.  She reports excellent compliance with treatment. She feels that condition is Improved. She is not having side effects.  ------------------------------------------------------------------------------------   COPD, Follow up:  She was last seen for this 1 months ago. Changes made include started on pro air and prednisone.   She reports excellent compliance with treatment. She is not having side effects.  she is/isnot using rescue inhaler. Typically 2 per weeks. She IS experiencing dyspnea and wheezing. She is NOT experiencing fever. she report breathing is Improved.  Pulmonary Functions Testing Results:  No results found for: FEV1, FVC, FEV1FVC,  TLC  ------------------------------------------------------------------------       Review of Systems  Constitutional: Positive for diaphoresis.  HENT: Negative.   Eyes: Negative.   Respiratory: Positive for shortness of breath and wheezing.   Cardiovascular: Negative.   Gastrointestinal: Negative.   Endocrine: Negative.   Genitourinary: Negative.   Musculoskeletal: Positive for neck pain and neck stiffness.  Skin: Negative.   Allergic/Immunologic: Negative.   Neurological: Negative.   Hematological: Bruises/bleeds easily.  Psychiatric/Behavioral: Negative.     Social History   Social History  . Marital Status: Married    Spouse Name: N/A  . Number of Children: N/A  . Years of Education: N/A   Occupational History  . Not on file.   Social History Main Topics  . Smoking status: Former Smoker -- 0.50 packs/day for 5 years    Quit date: 02/04/2005  . Smokeless tobacco: Never Used  . Alcohol Use: No  . Drug Use: No  . Sexual Activity: Not on file   Other Topics Concern  . Not on file   Social History Narrative    Patient Active Problem List   Diagnosis Date Noted  . Abnormal chest x-ray 01/25/2015  . Atrophic vaginitis 01/25/2015  . Atherosclerosis of coronary artery 01/25/2015  . Chronic kidney disease (CKD), stage III (moderate) 01/25/2015  . Does not exercise 01/25/2015  . Bloodgood disease 01/25/2015  . Blood glucose elevated 01/25/2015  . HLD (hyperlipidemia) 01/25/2015  . BP (high blood pressure) 01/25/2015  . Cannot sleep 01/25/2015  . Burning  or prickling sensation 01/25/2015  . Hypertension 11/02/2014  . Acid reflux 11/02/2014  . Arthritis 10/19/2014  . Adenomatous colon polyp 07/27/2013  . Gastric catarrh 07/27/2013  . Bergmann's syndrome 07/27/2013  . Barrett esophagus 07/07/2013  . Osteopenia 12/13/2009  . Peripheral blood vessel disorder ([place]) 09/24/2007  . DD (diverticular disease) 12/19/2005    Past Surgical History  Procedure  Laterality Date  . Breast surgery  2000    biopsy  . Lung biopsy  1999    Negative  . Hemorrhoid surgery    . Abdominal hysterectomy  1980    due to dysfunctional uterine bleeding  . Appendectomy  1980  . Coronary artery bypass graft      Her family history includes Alzheimer's disease in her mother; CAD in her mother; Diabetes in her paternal grandfather; Heart attack in her father; Heart disease in her sister; Hyperlipidemia in her mother; Hypertension in her mother; Lung disease in her sister.    Previous Medications   ACETAMINOPHEN (TYLENOL) 500 MG TABLET    Take 500 mg by mouth every 8 (eight) hours as needed.    ASCORBIC ACID 1000 MG TBCR    Take 1 tablet by mouth daily.    ASPIRIN 81 MG TABLET    Take 81 mg by mouth daily.    CALCIUM CARBONATE (OS-CAL) 600 MG TABS TABLET    Take 600 mg by mouth daily with breakfast.    METOPROLOL TARTRATE (LOPRESSOR) 25 MG TABLET    TAKE 1 TABLET BY MOUTH TWICE A DAY   MULTIPLE VITAMIN PO    Take 1 tablet by mouth daily.    NORTRIPTYLINE (PAMELOR) 25 MG CAPSULE    Take 25 mg by mouth every evening.   PANTOPRAZOLE (PROTONIX) 40 MG TABLET    TAKE 1 TABLET BY MOUTH TWICE A DAY   PROAIR HFA 108 (90 BASE) MCG/ACT INHALER    INHALE 2 INHALATIONS INTO THE LUNGS EVERY 6 (SIX) HOURS AS NEEDED FOR WHEEZING.   SIMVASTATIN (ZOCOR) 40 MG TABLET    Take 40 mg by mouth daily at 6 PM.    TRAMADOL (ULTRAM) 50 MG TABLET    TAKE 1 TO 2 TABLETS BY MOUTH EVERY 6 HOURS AS NEEDED    Patient Care Team: Margarita Rana, MD as PCP - General (Family Medicine)     Objective:   Vitals: BP 132/70 mmHg  Pulse 84  Temp(Src) 97.5 F (36.4 C) (Oral)  Resp 16  Ht 5\' 5"  (1.651 m)  Wt 173 lb (78.472 kg)  BMI 28.79 kg/m2  SpO2 99%  Physical Exam  Constitutional: She is oriented to person, place, and time. She appears well-developed and well-nourished.  HENT:  Head: Normocephalic and atraumatic.  Right Ear: Tympanic membrane, external ear and ear canal normal.  Left  Ear: Tympanic membrane, external ear and ear canal normal.  Nose: Nose normal.  Mouth/Throat: Uvula is midline, oropharynx is clear and moist and mucous membranes are normal.  Eyes: Conjunctivae, EOM and lids are normal. Pupils are equal, round, and reactive to light.  Neck: Trachea normal and normal range of motion. Neck supple. Carotid bruit is not present. No thyroid mass and no thyromegaly present.  Cardiovascular: Normal rate, regular rhythm and normal heart sounds.   Pulmonary/Chest: Effort normal and breath sounds normal.  Abdominal: Soft. Normal appearance and bowel sounds are normal. There is no hepatosplenomegaly. There is no tenderness.  Genitourinary: No breast swelling, tenderness or discharge.  Musculoskeletal: Normal range of motion.  Lymphadenopathy:  She has no cervical adenopathy.    She has no axillary adenopathy.  Neurological: She is alert and oriented to person, place, and time. She has normal strength. No cranial nerve deficit.  Skin: Skin is warm, dry and intact.  Psychiatric: She has a normal mood and affect. Her speech is normal and behavior is normal. Judgment and thought content normal. Cognition and memory are normal.    Activities of Daily Living In your present state of health, do you have any difficulty performing the following activities: 01/25/2015  Hearing? N  Vision? Y  Difficulty concentrating or making decisions? Y  Walking or climbing stairs? Y  Dressing or bathing? N  Doing errands, shopping? N    Fall Risk Assessment Fall Risk  01/25/2015  Falls in the past year? No     Depression Screen PHQ 2/9 Scores 01/25/2015  PHQ - 2 Score 0    Cognitive Testing - 6-CIT  Correct? Score   What year is it? yes 0 0 or 4  What month is it? yes 0 0 or 3  Memorize:    Pia Mau,  44,  [address]  [place],      What time is it? (within 1 hour) yes 0 0 or 3  Count backwards from 20 yes 1 0, 2, or 4  Name the months of the year yes 0 0, 2, or 4    Repeat name & address above yes 1 0, 2, 4, 6, 8, or 10       TOTAL SCORE  2/28   Interpretation:  Normal  Normal (0-7) Abnormal (8-28)       Assessment & Plan:     Annual Wellness Visit  Reviewed patient's Family Medical History Reviewed and updated list of patient's medical providers Assessment of cognitive impairment was done Assessed patient's functional ability Established a written schedule for health screening [place] Completed and Reviewed  Exercise Activities and Dietary recommendations Goals    . Exercise 150 minutes per week (moderate activity)       Immunization History  Administered Date(s) Administered  . Influenza-Unspecified 10/06/2013  . Pneumococcal Conjugate-13 12/28/2013  . Pneumococcal Polysaccharide-23 05/31/2003  . Zoster 03/17/2008        1. Medicare annual wellness visit, subsequent Stable. Patient advised to continue eating healthy and exercise daily.  2. Chronic obstructive pulmonary disease, unspecified COPD type ([place]) New problem. Worsening. Patient started on spiriva handihaler 18 mcg as below. Patient advised to follow-up with cardiology. - tiotropium (SPIRIVA HANDIHALER) 18 MCG inhalation capsule; Place 1 capsule (18 mcg total) into inhaler and inhale daily.  Dispense: 30 capsule; Refill: 6 - DG Chest 2 View; Future  3. Cough Worsening. - Spirometry with Graph  4. Essential hypertension - CBC with Differential/Platelet - Comprehensive metabolic panel - TSH  5. HLD (hyperlipidemia) - Lipid Panel With LDL/HDL Ratio  6. Blood glucose elevated - Hemoglobin A1c  7. Menopausal disorder Patient refused for now and would like to wait till next year for BMD. - DG Bone Density; Future  8. Breast cancer screening - MM DIGITAL SCREENING BILATERAL; Future     Patient seen and examined by Dr. Jerrell Belfast, and note scribed by Philbert Riser. Dimas, CMA.  I have reviewed the document for accuracy and  completeness and I agree with above. Jerrell Belfast, MD   Margarita Rana, MD    ------------------------------------------------------------------------------------------------------------

## 2015-01-26 ENCOUNTER — Telehealth: Payer: Self-pay | Admitting: Family Medicine

## 2015-01-26 DIAGNOSIS — J40 Bronchitis, not specified as acute or chronic: Secondary | ICD-10-CM | POA: Insufficient documentation

## 2015-01-26 DIAGNOSIS — R739 Hyperglycemia, unspecified: Secondary | ICD-10-CM | POA: Diagnosis not present

## 2015-01-26 DIAGNOSIS — E785 Hyperlipidemia, unspecified: Secondary | ICD-10-CM | POA: Diagnosis not present

## 2015-01-26 DIAGNOSIS — I10 Essential (primary) hypertension: Secondary | ICD-10-CM | POA: Diagnosis not present

## 2015-01-26 DIAGNOSIS — J029 Acute pharyngitis, unspecified: Secondary | ICD-10-CM | POA: Insufficient documentation

## 2015-01-26 MED ORDER — MAGIC MOUTHWASH W/LIDOCAINE: 5.0000 mL | Freq: Four times a day (QID) | ORAL | Status: DC | PRN

## 2015-01-26 MED ORDER — AMOXICILLIN-POT CLAVULANATE 875-125 MG PO TABS: 1.0000 | ORAL_TABLET | Freq: Two times a day (BID) | ORAL | Status: DC

## 2015-01-26 NOTE — Telephone Encounter (Signed)
Called about CXR. Will add antibiotic.

## 2015-01-26 NOTE — Telephone Encounter (Signed)
Throat pain. No other symptoms. Has been on Prednisone. Will treat for thrush.  Call if worsens or does not improve.

## 2015-01-27 LAB — CBC WITH DIFFERENTIAL/PLATELET
BASOS: 0 %
Basophils Absolute: 0 10*3/uL (ref 0.0–0.2)
EOS (ABSOLUTE): 0.1 10*3/uL (ref 0.0–0.4)
Eos: 2 %
Hematocrit: 38.5 % (ref 34.0–46.6)
Hemoglobin: 12.8 g/dL (ref 11.1–15.9)
IMMATURE GRANS (ABS): 0 10*3/uL (ref 0.0–0.1)
Immature Granulocytes: 0 %
LYMPHS ABS: 1.1 10*3/uL (ref 0.7–3.1)
LYMPHS: 17 %
MCH: 33.9 pg — AB (ref 26.6–33.0)
MCHC: 33.2 g/dL (ref 31.5–35.7)
MCV: 102 fL — AB (ref 79–97)
MONOS ABS: 0.4 10*3/uL (ref 0.1–0.9)
Monocytes: 7 %
NEUTROS ABS: 4.8 10*3/uL (ref 1.4–7.0)
Neutrophils: 74 %
PLATELETS: 216 10*3/uL (ref 150–379)
RBC: 3.78 x10E6/uL (ref 3.77–5.28)
RDW: 13.1 % (ref 12.3–15.4)
WBC: 6.5 10*3/uL (ref 3.4–10.8)

## 2015-01-27 LAB — COMPREHENSIVE METABOLIC PANEL
ALT: 15 IU/L (ref 0–32)
AST: 23 IU/L (ref 0–40)
Albumin/Globulin Ratio: 1.6 (ref 1.1–2.5)
Albumin: 3.9 g/dL (ref 3.5–4.8)
Alkaline Phosphatase: 78 IU/L (ref 39–117)
BUN/Creatinine Ratio: 16 (ref 11–26)
BUN: 18 mg/dL (ref 8–27)
Bilirubin Total: 0.5 mg/dL (ref 0.0–1.2)
CALCIUM: 9 mg/dL (ref 8.7–10.3)
CHLORIDE: 101 mmol/L (ref 96–106)
CO2: 27 mmol/L (ref 18–29)
Creatinine, Ser: 1.11 mg/dL — ABNORMAL HIGH (ref 0.57–1.00)
GFR, EST AFRICAN AMERICAN: 55 mL/min/{1.73_m2} — AB (ref >59)
GFR, EST NON AFRICAN AMERICAN: 48 mL/min/{1.73_m2} — AB (ref >59)
GLUCOSE: 101 mg/dL — AB (ref 65–99)
Globulin, Total: 2.5 g/dL (ref 1.5–4.5)
Potassium: 4.2 mmol/L (ref 3.5–5.2)
Sodium: 142 mmol/L (ref 134–144)
TOTAL PROTEIN: 6.4 g/dL (ref 6.0–8.5)

## 2015-01-27 LAB — TSH: TSH: 5.53 u[IU]/mL — ABNORMAL HIGH (ref 0.450–4.500)

## 2015-01-27 LAB — LIPID PANEL WITH LDL/HDL RATIO
CHOLESTEROL TOTAL: 170 mg/dL (ref 100–199)
HDL: 48 mg/dL (ref >39)
LDL CALC: 81 mg/dL (ref 0–99)
LDl/HDL Ratio: 1.7 ratio units (ref 0.0–3.2)
TRIGLYCERIDES: 205 mg/dL — AB (ref 0–149)
VLDL Cholesterol Cal: 41 mg/dL — ABNORMAL HIGH (ref 5–40)

## 2015-01-27 LAB — HEMOGLOBIN A1C
ESTIMATED AVERAGE GLUCOSE: 126 mg/dL
Hgb A1c MFr Bld: 6 % — ABNORMAL HIGH (ref 4.8–5.6)

## 2015-01-28 ENCOUNTER — Telehealth: Payer: Self-pay

## 2015-01-28 NOTE — Telephone Encounter (Signed)
Pt informed and voiced understanding of results. 

## 2015-01-28 NOTE — Telephone Encounter (Signed)
LMTCB 01/28/2015  Thanks,   -Mickel Baas

## 2015-01-28 NOTE — Telephone Encounter (Signed)
-----   Message from Margarita Rana, MD sent at 01/28/2015  7:40 AM EST ----- Labs stable, Thyroid slightly low but stable.  Please notify patient. Thanks.

## 2015-02-01 ENCOUNTER — Other Ambulatory Visit: Payer: Self-pay | Admitting: Family Medicine

## 2015-02-01 DIAGNOSIS — G4700 Insomnia, unspecified: Secondary | ICD-10-CM

## 2015-02-04 ENCOUNTER — Encounter: Payer: Self-pay | Admitting: Family Medicine

## 2015-02-04 ENCOUNTER — Telehealth: Payer: Self-pay | Admitting: Family Medicine

## 2015-02-04 NOTE — Telephone Encounter (Signed)
Did she take prednisone? May need another round. Also, may need to elevate the head of her bed.  No fevers?

## 2015-02-04 NOTE — Telephone Encounter (Signed)
Patient reports that she has a hacking cough mainly at night. She is requesting something be called in to help with cough. Patient reports that she has been using OTC cough syrups with no relief. Patient reports that she has 2 pills left of the Augmentin. Patient reports that she is starting to wheeze, and she has started to use rescue inhaler. Patient also has an appt to see lung specialist next month. Patient reports that she has never tried Tussionex or Hycodan and does not wish to because she is allergic to Codeine. Is there anything else you recommend for patient to take that will help her cough? Please advise. Thanks!

## 2015-02-04 NOTE — Telephone Encounter (Signed)
Pt called in saying she is still coughing, keeping her awake at night.  She is still taking the amoxicillin .  She wants to know if there is anything else she can do.  She does have an appt Jan 19th with Dr. Humphrey Rolls.    Her call back is [phone]  Thanks Con Memos

## 2015-02-05 ENCOUNTER — Emergency Department
Admission: EM | Admit: 2015-02-05 | Discharge: 2015-02-05 | Disposition: A | Discharge status: Home / Self Care | Payer: Medicare Other | Attending: Emergency Medicine | Admitting: Emergency Medicine

## 2015-02-05 ENCOUNTER — Emergency Department: Payer: Medicare Other

## 2015-02-05 DIAGNOSIS — J209 Acute bronchitis, unspecified: Secondary | ICD-10-CM | POA: Diagnosis not present

## 2015-02-05 DIAGNOSIS — N183 Chronic kidney disease, stage 3 (moderate): Secondary | ICD-10-CM | POA: Diagnosis not present

## 2015-02-05 DIAGNOSIS — Z91040 Latex allergy status: Secondary | ICD-10-CM | POA: Insufficient documentation

## 2015-02-05 DIAGNOSIS — R05 Cough: Secondary | ICD-10-CM | POA: Diagnosis not present

## 2015-02-05 DIAGNOSIS — Z79899 Other long term (current) drug therapy: Secondary | ICD-10-CM | POA: Insufficient documentation

## 2015-02-05 DIAGNOSIS — Z792 Long term (current) use of antibiotics: Secondary | ICD-10-CM | POA: Diagnosis not present

## 2015-02-05 DIAGNOSIS — I129 Hypertensive chronic kidney disease with stage 1 through stage 4 chronic kidney disease, or unspecified chronic kidney disease: Secondary | ICD-10-CM | POA: Diagnosis not present

## 2015-02-05 DIAGNOSIS — R0602 Shortness of breath: Secondary | ICD-10-CM | POA: Diagnosis not present

## 2015-02-05 DIAGNOSIS — Z87891 Personal history of nicotine dependence: Secondary | ICD-10-CM | POA: Diagnosis not present

## 2015-02-05 DIAGNOSIS — Z7982 Long term (current) use of aspirin: Secondary | ICD-10-CM | POA: Insufficient documentation

## 2015-02-05 MED ORDER — PREDNISONE 20 MG PO TABS
60.0000 mg | ORAL_TABLET | Freq: Once | ORAL | Status: AC
  Administered 2015-02-05: 60 mg via ORAL
  Filled 2015-02-05: qty 3

## 2015-02-05 MED ORDER — PREDNISONE 10 MG PO TABS: 50.0000 mg | ORAL_TABLET | Freq: Every day | ORAL | Status: DC

## 2015-02-05 MED ORDER — BENZONATATE 100 MG PO CAPS: 100.0000 mg | ORAL_CAPSULE | Freq: Four times a day (QID) | ORAL | Status: AC | PRN

## 2015-02-05 MED ORDER — ALBUTEROL SULFATE HFA 108 (90 BASE) MCG/ACT IN AERS: 2.0000 | INHALATION_SPRAY | Freq: Four times a day (QID) | RESPIRATORY_TRACT | Status: DC | PRN

## 2015-02-05 MED ORDER — BENZONATATE 100 MG PO CAPS
200.0000 mg | ORAL_CAPSULE | Freq: Once | ORAL | Status: AC
  Administered 2015-02-05: 200 mg via ORAL
  Filled 2015-02-05: qty 2

## 2015-02-05 MED ORDER — IPRATROPIUM-ALBUTEROL 0.5-2.5 (3) MG/3ML IN SOLN
3.0000 mL | Freq: Once | RESPIRATORY_TRACT | Status: AC
  Administered 2015-02-05: 3 mL via RESPIRATORY_TRACT
  Filled 2015-02-05: qty 3

## 2015-02-05 MED ORDER — ALBUTEROL SULFATE (2.5 MG/3ML) 0.083% IN NEBU
5.0000 mg | INHALATION_SOLUTION | Freq: Once | RESPIRATORY_TRACT | Status: AC
  Administered 2015-02-05: 5 mg via RESPIRATORY_TRACT
  Filled 2015-02-05: qty 6

## 2015-02-05 NOTE — ED Provider Notes (Signed)
[facility] Emergency Department Provider Note   ____________________________________________  Time seen:  I have reviewed the triage vital signs and the triage nursing note.  HISTORY  Chief Complaint Cough and Wheezing   Historian Patient, spouse and other family members  HPI Christina [name] is a 79 y.o. female with a history of hypertension, GERD, and recent approximately 6-8 weeks of recurrent episodes of wheezing and shortness of breath with cough, is here for persistent cough, especially worse when she is trying to sleep at night. No fever. She has had some generalized overall weakness. No chest pain, nausea, or sweats.  She has run out of her albuterol. She does take Spiriva. She has never been diagnosed with asthma or COPD, but has a pulmonology appointment for the end of January scheduled.  Family is questioning whether or not she might have pneumonia. She is currently finishing amoxicillin. Family does not state that this has helped her breathing much at all.    Past Medical History  Diagnosis Date  . Hypertension     Patient Active Problem List   Diagnosis Date Noted  . Sore throat 01/26/2015  . Bronchitis 01/26/2015  . Abnormal chest x-ray 01/25/2015  . Atrophic vaginitis 01/25/2015  . Atherosclerosis of coronary artery 01/25/2015  . Chronic kidney disease (CKD), stage III (moderate) 01/25/2015  . Does not exercise 01/25/2015  . Bloodgood disease 01/25/2015  . Blood glucose elevated 01/25/2015  . HLD (hyperlipidemia) 01/25/2015  . BP (high blood pressure) 01/25/2015  . Cannot sleep 01/25/2015  . Burning or prickling sensation 01/25/2015  . Hypertension 11/02/2014  . Acid reflux 11/02/2014  . Arthritis 10/19/2014  . Adenomatous colon polyp 07/27/2013  . Gastric catarrh 07/27/2013  . Bergmann's syndrome 07/27/2013  . Barrett esophagus 07/07/2013  . Osteopenia 12/13/2009  . Peripheral blood vessel disorder ([place]) 09/24/2007  . DD  (diverticular disease) 12/19/2005    Past Surgical History  Procedure Laterality Date  . Breast surgery  2000    biopsy  . Lung biopsy  1999    Negative  . Hemorrhoid surgery    . Abdominal hysterectomy  1980    due to dysfunctional uterine bleeding  . Appendectomy  1980  . Coronary artery bypass graft      Current Outpatient Rx  Name  Route  Sig  Dispense  Refill  . acetaminophen (TYLENOL) 500 MG tablet   Oral   Take 500 mg by mouth every 8 (eight) hours as needed.          Marland Kitchen albuterol (PROVENTIL HFA;VENTOLIN HFA) 108 (90 Base) MCG/ACT inhaler   Inhalation   Inhale 2 puffs into the lungs every 6 (six) hours as needed for wheezing or shortness of breath.   1 Inhaler   0   . amoxicillin-clavulanate (AUGMENTIN) 875-125 MG tablet   Oral   Take 1 tablet by mouth 2 (two) times daily.   20 tablet   0   . Ascorbic Acid 1000 MG TBCR   Oral   Take 1 tablet by mouth daily.          Marland Kitchen aspirin 81 MG tablet   Oral   Take 81 mg by mouth daily.          . benzonatate (TESSALON PERLES) 100 MG capsule   Oral   Take 1 capsule (100 mg total) by mouth every 6 (six) hours as needed for cough.   30 capsule   0   . calcium carbonate (OS-CAL) 600 MG TABS  tablet   Oral   Take 600 mg by mouth daily with breakfast.          . magic mouthwash w/lidocaine SOLN   Oral   Take 5 mLs by mouth 4 (four) times daily as needed for mouth pain. 5% Lidocaine.   140 mL   0   . metoprolol tartrate (LOPRESSOR) 25 MG tablet      TAKE 1 TABLET BY MOUTH TWICE A DAY   180 tablet   1   . MULTIPLE VITAMIN PO   Oral   Take 1 tablet by mouth daily.          . nortriptyline (PAMELOR) 25 MG capsule      TAKE ONE CAPSULE BY MOUTH EVERY EVENING   90 capsule   3   . pantoprazole (PROTONIX) 40 MG tablet      TAKE 1 TABLET BY MOUTH TWICE A DAY   180 tablet   3   . predniSONE (DELTASONE) 10 MG tablet   Oral   Take 5 tablets (50 mg total) by mouth daily.   20 tablet   0   . PROAIR  HFA 108 (90 BASE) MCG/ACT inhaler      INHALE 2 INHALATIONS INTO THE LUNGS EVERY 6 (SIX) HOURS AS NEEDED FOR WHEEZING.      0     Dispense as written.   . simvastatin (ZOCOR) 40 MG tablet   Oral   Take 40 mg by mouth daily at 6 PM.          . tiotropium (SPIRIVA HANDIHALER) 18 MCG inhalation capsule   Inhalation   Place 1 capsule (18 mcg total) into inhaler and inhale daily.   30 capsule   6   . traMADol (ULTRAM) 50 MG tablet      TAKE 1 TO 2 TABLETS BY MOUTH EVERY 6 HOURS AS NEEDED   60 tablet   2     This request is for a new prescription for a contr ...     Allergies Baclofen; Codeine; Sulfa antibiotics; and Latex  Family History  Problem Relation Age of Onset  . Hypertension Mother   . Hyperlipidemia Mother   . Alzheimer's disease Mother   . CAD Mother   . Heart attack Father   . Lung disease Sister   . Heart disease Sister   . Diabetes Paternal Grandfather     Type 2    Social History Social History  Substance Use Topics  . Smoking status: Former Smoker -- 0.50 packs/day for 5 years    Quit date: 02/04/2005  . Smokeless tobacco: Never Used  . Alcohol Use: No    Review of Systems  Constitutional: Negative for fever. Eyes: Negative for visual changes. ENT: Negative for sore throat. Cardiovascular: Negative for chest pain. Respiratory: Positive for shortness of breath. Cough without sputum production. Gastrointestinal: Negative for abdominal pain, vomiting and diarrhea. Genitourinary: Negative for dysuria. Musculoskeletal: Negative for back pain. Skin: Negative for rash. Neurological: Negative for headache. 10 point Review of Systems otherwise negative ____________________________________________   PHYSICAL EXAM:  VITAL SIGNS: ED Triage Vitals  Enc Vitals Group     BP 02/05/15 1618 175/85 mmHg     Pulse Rate 02/05/15 1618 85     Resp 02/05/15 1618 18     Temp 02/05/15 1618 98.2 F (36.8 C)     Temp Source 02/05/15 1618 Oral     SpO2  02/05/15 1618 98 %     Weight 02/05/15  1618 173 lb (78.472 kg)     Height 02/05/15 1618 5\' 6"  (1.676 m)     Head Cir --      Peak Flow --      Pain Score --      Pain Loc --      Pain Edu? --      Excl. in [place]? --      Constitutional: Alert and oriented. Well appearing and in no distress. Eyes: Conjunctivae are normal. PERRL. Normal extraocular movements. ENT   Head: Normocephalic and atraumatic.   Nose: No congestion/rhinnorhea.   Mouth/Throat: Mucous membranes are moist.   Neck: No stridor. Cardiovascular/Chest: Normal rate, regular rhythm.  No murmurs, rubs, or gallops. Respiratory: Normal respiratory effort without tachypnea nor retractions. Moderate wheezing all lung fields. Mild rhonchi posteriorly bases bilaterally Gastrointestinal: Soft. No distention, no guarding, no rebound. Nontender  Genitourinary/rectal:Deferred Musculoskeletal: Nontender with normal range of motion in all extremities. No joint effusions.  No lower extremity tenderness.  No edema. Neurologic:  Normal speech and language. No gross or focal neurologic deficits are appreciated. Skin:  Skin is warm, dry and intact. No rash noted. Psychiatric: Mood and affect are normal. Speech and behavior are normal. Patient exhibits appropriate insight and judgment.  ____________________________________________   EKG I, Lisa Roca, MD, the attending physician have personally viewed and interpreted all ECGs.  70 bpm. Normal sinus rhythm. Narrow QRS. Normal axis. Nonspecific ST and T-wave ____________________________________________  LABS (pertinent positives/negatives)  None  ____________________________________________  RADIOLOGY All Xrays were viewed by me. Imaging interpreted by Radiologist.  Ardyth Gal two-view: No acute cardio pulmonary disease. Cardiomegaly without congestive failure. Large hiatal hernia __________________________________________  PROCEDURES  Procedure(s) performed:  None  Critical Care performed: None  ____________________________________________   ED COURSE / ASSESSMENT AND PLAN  CONSULTATIONS: None  Pertinent labs & imaging results that were available during my care of the patient were reviewed by me and considered in my medical decision making (see chart for details).   This patient is overall well-appearing with stable vital signs. O2 sat as low as 93% on room air. She does have wheezing, was given a DuoNeb treatment here with some improvement, but still some residual rhonchi and wheezing. I am going to add prednisone burst as well as Tessalon.  I will refill her albuterol inhaler prescription.  No cardiac symptoms. No clinical dehydration. I don't think laboratory studies would change management.   Tolerated walking.   Patient / Family / Caregiver informed of clinical course, medical decision-making process, and agree with plan.   I discussed return precautions, follow-up instructions, and discharged instructions with patient and/or family.  ___________________________________________   FINAL CLINICAL IMPRESSION(S) / ED DIAGNOSES   Final diagnoses:  Acute bronchitis, unspecified organism       Lisa Roca, MD 02/05/15 2203

## 2015-02-05 NOTE — ED Notes (Signed)
Pt ambulated for 2 full laps around the main ED; pt's HR 94, O2 sats maintained at 97% on RA.

## 2015-02-05 NOTE — ED Notes (Signed)
MD at bedside. 

## 2015-02-05 NOTE — Discharge Instructions (Signed)
You were evaluated for wheezing and are being treated for acute bronchitis. I have provided a refill on your albuterol inhaler. I am also adding the medication prednisone, 50 mg daily for 4 more days. I am also providing a cough medication called Tessalon, as needed.  Return to the emergency department for any worsening condition clubbing fever, trouble breathing, chest pain, nausea, sweats, or any other symptoms concerning to you.   Acute Bronchitis Bronchitis is inflammation of the airways that extend from the windpipe into the lungs (bronchi). The inflammation often causes mucus to develop. This leads to a cough, which is the most common symptom of bronchitis.  In acute bronchitis, the condition usually develops suddenly and goes away over time, usually in a couple weeks. Smoking, allergies, and asthma can make bronchitis worse. Repeated episodes of bronchitis may cause further lung problems.  CAUSES Acute bronchitis is most often caused by the same virus that causes a cold. The virus can spread from person to person (contagious) through coughing, sneezing, and touching contaminated objects. SIGNS AND SYMPTOMS   Cough.   Fever.   Coughing up mucus.   Body aches.   Chest congestion.   Chills.   Shortness of breath.   Sore throat.  DIAGNOSIS  Acute bronchitis is usually diagnosed through a physical exam. Your health care provider will also ask you questions about your medical history. Tests, such as chest X-rays, are sometimes done to rule out other conditions.  TREATMENT  Acute bronchitis usually goes away in a couple weeks. Oftentimes, no medical treatment is necessary. Medicines are sometimes given for relief of fever or cough. Antibiotic medicines are usually not needed but may be prescribed in certain situations. In some cases, an inhaler may be recommended to help reduce shortness of breath and control the cough. A cool mist vaporizer may also be used to help thin  bronchial secretions and make it easier to clear the chest.  HOME CARE INSTRUCTIONS  Get plenty of rest.   Drink enough fluids to keep your urine clear or pale yellow (unless you have a medical condition that requires fluid restriction). Increasing fluids may help thin your respiratory secretions (sputum) and reduce chest congestion, and it will prevent dehydration.   Take medicines only as directed by your health care provider.  If you were prescribed an antibiotic medicine, finish it all even if you start to feel better.  Avoid smoking and secondhand smoke. Exposure to cigarette smoke or irritating chemicals will make bronchitis worse. If you are a smoker, consider using nicotine gum or skin patches to help control withdrawal symptoms. Quitting smoking will help your lungs heal faster.   Reduce the chances of another bout of acute bronchitis by washing your hands frequently, avoiding people with cold symptoms, and trying not to touch your hands to your mouth, nose, or eyes.   Keep all follow-up visits as directed by your health care provider.  SEEK MEDICAL CARE IF: Your symptoms do not improve after 1 week of treatment.  SEEK IMMEDIATE MEDICAL CARE IF:  You develop an increased fever or chills.   You have chest pain.   You have severe shortness of breath.  You have bloody sputum.   You develop dehydration.  You faint or repeatedly feel like you are going to pass out.  You develop repeated vomiting.  You develop a severe headache. MAKE SURE YOU:   Understand these instructions.  Will watch your condition.  Will get help right away if you are not  doing well or get worse.   This information is not intended to replace advice given to you by your health care provider. Make sure you discuss any questions you have with your health care provider.   Document Released: 03/01/2004 Document Revised: 02/12/2014 Document Reviewed: 07/15/2012 Elsevier Interactive Patient  Education [employer].

## 2015-02-05 NOTE — ED Notes (Signed)
Pt here for cough with occasional wheezing.  Pt was recently taking amoxicillin for possible pneumonia.

## 2015-02-08 NOTE — Telephone Encounter (Signed)
Patient reports that she went to the ER on Friday and they prescribed prednisone. Patient reports that she was told to F/U with [place]. She reports that she will call us back to schedule an appt due to not having transportation.

## 2015-02-11 ENCOUNTER — Encounter: Payer: Self-pay | Admitting: Family Medicine

## 2015-02-11 ENCOUNTER — Ambulatory Visit (INDEPENDENT_AMBULATORY_CARE_PROVIDER_SITE_OTHER): Payer: Medicare Other | Admitting: Family Medicine

## 2015-02-11 VITALS — BP 138/80 | HR 86 | Temp 98.0°F | Resp 16 | Wt 170.0 lb

## 2015-02-11 DIAGNOSIS — J40 Bronchitis, not specified as acute or chronic: Secondary | ICD-10-CM

## 2015-02-11 DIAGNOSIS — I10 Essential (primary) hypertension: Secondary | ICD-10-CM | POA: Diagnosis not present

## 2015-02-11 NOTE — Progress Notes (Signed)
Subjective:    Patient ID: Christina [name], female    DOB: 1935-11-15, 80 y.o.   MRN: [id number]  HPI   Follow up Hospitalization  Patient was admitted to [facility] on 02/05/2015 and discharged on same day.  She was treated for cough; diagnosed with bronchitis. Treatment for this included Prednisone and Tessalon Perles. Taking her medication without difficulty. She reports excellent compliance with treatment. She reports this condition is Improved. She feels much better.    Pt scheduled an appointment to see pulmonologist Dr. Humphrey Rolls on 02/24/2015. ------------------------------------------------------------------------------------    Review of Systems  Constitutional: Negative for fever, chills, diaphoresis, activity change, appetite change, fatigue and unexpected weight change.  Respiratory: Positive for cough (productive with clear sputum). Negative for apnea, choking, chest tightness, shortness of breath, wheezing and stridor.   Cardiovascular: Negative for chest pain and palpitations.  Neurological: Negative.    BP 138/80 mmHg  Pulse 86  Temp(Src) 98 F (36.7 C) (Oral)  Resp 16  Wt 170 lb (77.111 kg)  SpO2 97%   Patient Active Problem List   Diagnosis Date Noted  . Bronchitis 01/26/2015  . Abnormal chest x-ray 01/25/2015  . Atrophic vaginitis 01/25/2015  . Atherosclerosis of coronary artery 01/25/2015  . Chronic kidney disease (CKD), stage III (moderate) 01/25/2015  . Does not exercise 01/25/2015  . Bloodgood disease 01/25/2015  . Blood glucose elevated 01/25/2015  . HLD (hyperlipidemia) 01/25/2015  . Cannot sleep 01/25/2015  . Hypertension 11/02/2014  . Acid reflux 11/02/2014  . Arthritis 10/19/2014  . Adenomatous colon polyp 07/27/2013  . Gastric catarrh 07/27/2013  . Bergmann's syndrome 07/27/2013  . Barrett esophagus 07/07/2013  . Osteopenia 12/13/2009  . Peripheral blood vessel disorder ([place]) 09/24/2007  . DD (diverticular disease) 12/19/2005   Past  Medical History  Diagnosis Date  . Hypertension    Current Outpatient Prescriptions on File Prior to Visit  Medication Sig  . acetaminophen (TYLENOL) 500 MG tablet Take 500 mg by mouth every 8 (eight) hours as needed.   Marland Kitchen albuterol (PROVENTIL HFA;VENTOLIN HFA) 108 (90 Base) MCG/ACT inhaler Inhale 2 puffs into the lungs every 6 (six) hours as needed for wheezing or shortness of breath.  . Ascorbic Acid 1000 MG TBCR Take 1 tablet by mouth daily.   Marland Kitchen aspirin 81 MG tablet Take 81 mg by mouth daily.   . benzonatate (TESSALON PERLES) 100 MG capsule Take 1 capsule (100 mg total) by mouth every 6 (six) hours as needed for cough.  . calcium carbonate (OS-CAL) 600 MG TABS tablet Take 600 mg by mouth daily with breakfast.   . metoprolol tartrate (LOPRESSOR) 25 MG tablet TAKE 1 TABLET BY MOUTH TWICE A DAY  . MULTIPLE VITAMIN PO Take 1 tablet by mouth daily.   . nortriptyline (PAMELOR) 25 MG capsule TAKE ONE CAPSULE BY MOUTH EVERY EVENING  . pantoprazole (PROTONIX) 40 MG tablet TAKE 1 TABLET BY MOUTH TWICE A DAY  . simvastatin (ZOCOR) 40 MG tablet Take 40 mg by mouth daily at 6 PM.   . traMADol (ULTRAM) 50 MG tablet TAKE 1 TO 2 TABLETS BY MOUTH EVERY 6 HOURS AS NEEDED  . tiotropium (SPIRIVA HANDIHALER) 18 MCG inhalation capsule Place 1 capsule (18 mcg total) into inhaler and inhale daily. (Patient not taking: Reported on 02/11/2015)   No current facility-administered medications on file prior to visit.   Allergies  Allergen Reactions  . Baclofen Other (See Comments)  . Codeine   . Sulfa Antibiotics   . Latex Rash  Past Surgical History  Procedure Laterality Date  . Breast surgery  2000    biopsy  . Lung biopsy  1999    Negative  . Hemorrhoid surgery    . Abdominal hysterectomy  1980    due to dysfunctional uterine bleeding  . Appendectomy  1980  . Coronary artery bypass graft     Social History   Social History  . Marital Status: Married    Spouse Name: N/A  . Number of Children: N/A    . Years of Education: N/A   Occupational History  . Not on file.   Social History Main Topics  . Smoking status: Former Smoker -- 0.50 packs/day for 5 years    Quit date: 04/24/2004  . Smokeless tobacco: Never Used  . Alcohol Use: No  . Drug Use: No  . Sexual Activity: Not on file   Other Topics Concern  . Not on file   Social History Narrative   Family History  Problem Relation Age of Onset  . Hypertension Mother   . Hyperlipidemia Mother   . Alzheimer's disease Mother   . CAD Mother   . Heart attack Father   . Lung disease Sister   . Heart disease Sister   . Diabetes Paternal Grandfather     Type 2      Objective:   Physical Exam  Constitutional: She is oriented to person, place, and time. She appears well-developed and well-nourished.  Neck: Normal range of motion. Neck supple.  Cardiovascular: Normal rate and regular rhythm.   Pulmonary/Chest: Effort normal and breath sounds normal.  Neurological: She is alert and oriented to person, place, and time.  Psychiatric: She has a normal mood and affect. Her behavior is normal. Judgment and thought content normal.   BP 138/80 mmHg  Pulse 86  Temp(Src) 98 F (36.7 C) (Oral)  Resp 16  Wt 170 lb (77.111 kg)  SpO2 97%      Assessment & Plan:  1. Essential hypertension Stable. Continue current medication.    2. Bronchitis Stable. Improved. Reviewed medication and when to use albuterol.  Follow up with pulmonary as scheduled.     Margarita Rana, MD

## 2015-02-23 ENCOUNTER — Encounter: Payer: Self-pay | Admitting: Family Medicine

## 2015-02-24 DIAGNOSIS — R0602 Shortness of breath: Secondary | ICD-10-CM | POA: Diagnosis not present

## 2015-02-24 DIAGNOSIS — I2510 Atherosclerotic heart disease of native coronary artery without angina pectoris: Secondary | ICD-10-CM | POA: Diagnosis not present

## 2015-02-24 DIAGNOSIS — J449 Chronic obstructive pulmonary disease, unspecified: Secondary | ICD-10-CM | POA: Diagnosis not present

## 2015-03-07 ENCOUNTER — Other Ambulatory Visit: Payer: Self-pay | Admitting: Family Medicine

## 2015-03-07 DIAGNOSIS — M1990 Unspecified osteoarthritis, unspecified site: Secondary | ICD-10-CM

## 2015-03-07 NOTE — Telephone Encounter (Signed)
Printed, please fax or call in to pharmacy. Thank you.   

## 2015-03-09 DIAGNOSIS — R0602 Shortness of breath: Secondary | ICD-10-CM | POA: Diagnosis not present

## 2015-03-11 DIAGNOSIS — H353221 Exudative age-related macular degeneration, left eye, with active choroidal neovascularization: Secondary | ICD-10-CM | POA: Diagnosis not present

## 2015-03-11 DIAGNOSIS — H353213 Exudative age-related macular degeneration, right eye, with inactive scar: Secondary | ICD-10-CM | POA: Diagnosis not present

## 2015-03-11 DIAGNOSIS — H43813 Vitreous degeneration, bilateral: Secondary | ICD-10-CM | POA: Diagnosis not present

## 2015-03-16 DIAGNOSIS — R0602 Shortness of breath: Secondary | ICD-10-CM | POA: Diagnosis not present

## 2015-03-28 DIAGNOSIS — I2510 Atherosclerotic heart disease of native coronary artery without angina pectoris: Secondary | ICD-10-CM | POA: Diagnosis not present

## 2015-03-28 DIAGNOSIS — J449 Chronic obstructive pulmonary disease, unspecified: Secondary | ICD-10-CM | POA: Diagnosis not present

## 2015-03-28 DIAGNOSIS — R0602 Shortness of breath: Secondary | ICD-10-CM | POA: Diagnosis not present

## 2015-04-04 DIAGNOSIS — G4733 Obstructive sleep apnea (adult) (pediatric): Secondary | ICD-10-CM | POA: Diagnosis not present

## 2015-04-11 DIAGNOSIS — G4733 Obstructive sleep apnea (adult) (pediatric): Secondary | ICD-10-CM | POA: Diagnosis not present

## 2015-04-13 DIAGNOSIS — G4733 Obstructive sleep apnea (adult) (pediatric): Secondary | ICD-10-CM | POA: Diagnosis not present

## 2015-04-25 DIAGNOSIS — G4733 Obstructive sleep apnea (adult) (pediatric): Secondary | ICD-10-CM | POA: Diagnosis not present

## 2015-04-25 DIAGNOSIS — J449 Chronic obstructive pulmonary disease, unspecified: Secondary | ICD-10-CM | POA: Diagnosis not present

## 2015-04-29 ENCOUNTER — Other Ambulatory Visit: Payer: Self-pay | Admitting: Family Medicine

## 2015-04-29 DIAGNOSIS — I10 Essential (primary) hypertension: Secondary | ICD-10-CM

## 2015-05-11 DIAGNOSIS — G4733 Obstructive sleep apnea (adult) (pediatric): Secondary | ICD-10-CM | POA: Diagnosis not present

## 2015-05-12 DIAGNOSIS — H353221 Exudative age-related macular degeneration, left eye, with active choroidal neovascularization: Secondary | ICD-10-CM | POA: Diagnosis not present

## 2015-05-12 DIAGNOSIS — H353213 Exudative age-related macular degeneration, right eye, with inactive scar: Secondary | ICD-10-CM | POA: Diagnosis not present

## 2015-06-10 DIAGNOSIS — G4733 Obstructive sleep apnea (adult) (pediatric): Secondary | ICD-10-CM | POA: Diagnosis not present

## 2015-06-15 DIAGNOSIS — G4733 Obstructive sleep apnea (adult) (pediatric): Secondary | ICD-10-CM | POA: Diagnosis not present

## 2015-06-28 DIAGNOSIS — J449 Chronic obstructive pulmonary disease, unspecified: Secondary | ICD-10-CM | POA: Diagnosis not present

## 2015-06-28 DIAGNOSIS — R0683 Snoring: Secondary | ICD-10-CM | POA: Diagnosis not present

## 2015-06-28 DIAGNOSIS — G4733 Obstructive sleep apnea (adult) (pediatric): Secondary | ICD-10-CM | POA: Diagnosis not present

## 2015-07-08 ENCOUNTER — Other Ambulatory Visit: Payer: Self-pay | Admitting: Family Medicine

## 2015-07-08 DIAGNOSIS — H353213 Exudative age-related macular degeneration, right eye, with inactive scar: Secondary | ICD-10-CM | POA: Diagnosis not present

## 2015-07-08 DIAGNOSIS — H35423 Microcystoid degeneration of retina, bilateral: Secondary | ICD-10-CM | POA: Diagnosis not present

## 2015-07-08 DIAGNOSIS — H353221 Exudative age-related macular degeneration, left eye, with active choroidal neovascularization: Secondary | ICD-10-CM | POA: Diagnosis not present

## 2015-07-08 DIAGNOSIS — M1990 Unspecified osteoarthritis, unspecified site: Secondary | ICD-10-CM

## 2015-07-08 DIAGNOSIS — H43813 Vitreous degeneration, bilateral: Secondary | ICD-10-CM | POA: Diagnosis not present

## 2015-07-08 NOTE — Telephone Encounter (Signed)
Printed, please fax or call in to pharmacy. Thank you.   

## 2015-07-11 DIAGNOSIS — G4733 Obstructive sleep apnea (adult) (pediatric): Secondary | ICD-10-CM | POA: Diagnosis not present

## 2015-07-11 DIAGNOSIS — H353221 Exudative age-related macular degeneration, left eye, with active choroidal neovascularization: Secondary | ICD-10-CM | POA: Diagnosis not present

## 2015-08-20 ENCOUNTER — Other Ambulatory Visit: Payer: Self-pay | Admitting: Family Medicine

## 2015-09-06 ENCOUNTER — Telehealth: Payer: Self-pay | Admitting: Physician Assistant

## 2015-09-06 DIAGNOSIS — M1990 Unspecified osteoarthritis, unspecified site: Secondary | ICD-10-CM

## 2015-09-06 NOTE — Telephone Encounter (Signed)
Pt needs a refill on the following medication, pt called [facility] ([address]) and was told by pharmacists to call Dr office to get a prior authorization.  Thanks CC    traMADol (ULTRAM) 50 MG tablet

## 2015-09-07 MED ORDER — TRAMADOL HCL 50 MG PO TABS: 50.0000 mg | ORAL_TABLET | Freq: Four times a day (QID) | ORAL | 5 refills | Status: DC | PRN

## 2015-09-07 NOTE — Telephone Encounter (Signed)
Please call in tramadol.  

## 2015-09-07 NOTE — Telephone Encounter (Signed)
Rx called in to pharmacy. 

## 2015-09-16 ENCOUNTER — Encounter: Payer: Self-pay | Admitting: Family Medicine

## 2015-09-16 DIAGNOSIS — Z9989 Dependence on other enabling machines and devices: Secondary | ICD-10-CM

## 2015-09-16 DIAGNOSIS — G4733 Obstructive sleep apnea (adult) (pediatric): Secondary | ICD-10-CM | POA: Insufficient documentation

## 2015-10-28 ENCOUNTER — Other Ambulatory Visit: Payer: Self-pay | Admitting: Family Medicine

## 2015-10-28 DIAGNOSIS — I10 Essential (primary) hypertension: Secondary | ICD-10-CM

## 2016-01-10 DIAGNOSIS — G4733 Obstructive sleep apnea (adult) (pediatric): Secondary | ICD-10-CM | POA: Diagnosis not present

## 2016-01-16 DIAGNOSIS — I10 Essential (primary) hypertension: Secondary | ICD-10-CM | POA: Diagnosis not present

## 2016-01-16 DIAGNOSIS — J441 Chronic obstructive pulmonary disease with (acute) exacerbation: Secondary | ICD-10-CM | POA: Diagnosis not present

## 2016-01-18 DIAGNOSIS — Z1231 Encounter for screening mammogram for malignant neoplasm of breast: Secondary | ICD-10-CM | POA: Diagnosis not present

## 2016-01-18 LAB — HM MAMMOGRAPHY

## 2016-01-20 ENCOUNTER — Encounter: Payer: Self-pay | Admitting: *Deleted

## 2016-01-27 ENCOUNTER — Ambulatory Visit (INDEPENDENT_AMBULATORY_CARE_PROVIDER_SITE_OTHER): Payer: Medicare Other | Admitting: Physician Assistant

## 2016-01-27 ENCOUNTER — Encounter: Payer: Self-pay | Admitting: Physician Assistant

## 2016-01-27 VITALS — BP 128/74 | HR 80 | Temp 98.3°F | Resp 16 | Ht 64.0 in | Wt 173.0 lb

## 2016-01-27 DIAGNOSIS — N183 Chronic kidney disease, stage 3 unspecified: Secondary | ICD-10-CM

## 2016-01-27 DIAGNOSIS — I10 Essential (primary) hypertension: Secondary | ICD-10-CM

## 2016-01-27 DIAGNOSIS — E039 Hypothyroidism, unspecified: Secondary | ICD-10-CM | POA: Diagnosis not present

## 2016-01-27 DIAGNOSIS — Z0001 Encounter for general adult medical examination with abnormal findings: Secondary | ICD-10-CM

## 2016-01-27 DIAGNOSIS — Z0000 Encounter for general adult medical examination without abnormal findings: Secondary | ICD-10-CM

## 2016-01-27 DIAGNOSIS — E7800 Pure hypercholesterolemia, unspecified: Secondary | ICD-10-CM | POA: Diagnosis not present

## 2016-01-27 NOTE — Patient Instructions (Signed)

## 2016-01-27 NOTE — Progress Notes (Signed)
Patient: Christina [name], Female    DOB: [birth year], 80 y.o.   MRN: [id number] Visit Date: 01/27/2016  Today's Provider: Mar Daring, PA-C   Chief Complaint  Patient presents with  . Medicare Wellness   Subjective:    Annual wellness visit Christina [name] is a 80 y.o. female. She feels fairly well. She reports exercising none. She reports she is sleeping well.  Last AWV- 01/25/2015 Last colonoscopy- 07/20/2013- Tubular adenoma, diverticulosis, internal hemorrhoids. No repeat. Dr. Vira Agar Last BMD- 01/12/2013- osteopenia Last mammogram- 01/18/2016- BI-RADS 1 S/P Hysterectomy 1980 -----------------------------------------------------------   Review of Systems  Constitutional: Positive for activity change and fatigue. Negative for appetite change, chills, diaphoresis, fever and unexpected weight change.  HENT: Positive for congestion, nosebleeds and trouble swallowing. Negative for dental problem, drooling, ear discharge, ear pain, facial swelling, hearing loss, mouth sores, postnasal drip, rhinorrhea, sinus pain, sinus pressure, sneezing, sore throat, tinnitus and voice change.   Eyes: Negative.   Respiratory: Positive for apnea, shortness of breath and wheezing. Negative for cough, choking, chest tightness and stridor.   Cardiovascular: Positive for leg swelling. Negative for chest pain and palpitations.  Gastrointestinal: Negative.   Endocrine: Negative.   Genitourinary: Negative.   Musculoskeletal: Negative.   Skin: Negative.   Allergic/Immunologic: Negative.   Neurological: Negative.   Hematological: Negative for adenopathy. Bruises/bleeds easily.  Psychiatric/Behavioral: Negative.     Social History   Social History  . Marital status: Married    Spouse name: Cliffton Mathis Bud  . Number of children: 2  . Years of education: 12   Occupational History  . retired    Social History Main Topics  . Smoking status: Former Smoker   Packs/day: 0.50    Years: 5.00    Quit date: 04/24/2004  . Smokeless tobacco: Never Used  . Alcohol use No  . Drug use: No  . Sexual activity: Yes   Other Topics Concern  . Not on file   Social History Narrative  . No narrative on file    Past Medical History:  Diagnosis Date  . Hypertension      Patient Active Problem List   Diagnosis Date Noted  . OSA on CPAP 09/16/2015  . Abnormal chest x-ray 01/25/2015  . Atrophic vaginitis 01/25/2015  . Atherosclerosis of coronary artery 01/25/2015  . Chronic kidney disease (CKD), stage III (moderate) 01/25/2015  . Does not exercise 01/25/2015  . Bloodgood disease 01/25/2015  . Blood glucose elevated 01/25/2015  . HLD (hyperlipidemia) 01/25/2015  . Cannot sleep 01/25/2015  . Hypertension 11/02/2014  . Acid reflux 11/02/2014  . Arthritis 10/19/2014  . Adenomatous colon polyp 07/27/2013  . Gastric catarrh 07/27/2013  . Bergmann's syndrome 07/27/2013  . Barrett esophagus 07/07/2013  . Osteopenia 12/13/2009  . Peripheral blood vessel disorder ([place]) 09/24/2007  . DD (diverticular disease) 12/19/2005    Past Surgical History:  Procedure Laterality Date  . ABDOMINAL HYSTERECTOMY  1980   due to dysfunctional uterine bleeding  . APPENDECTOMY  1980  . BREAST SURGERY  2000   biopsy  . CORONARY ARTERY BYPASS GRAFT    . HEMORRHOID SURGERY    . LUNG BIOPSY  1999   Negative    Her family history includes Alzheimer's disease in her mother; CAD in her mother; Diabetes in her paternal grandfather; Heart attack in her father; Heart disease in her sister; Hyperlipidemia in her mother; Hypertension in her mother; Lung disease in her sister.      Current  Outpatient Prescriptions:  .  acetaminophen (TYLENOL) 500 MG tablet, Take 500 mg by mouth every 8 (eight) hours as needed. , Disp: , Rfl:  .  albuterol (PROVENTIL HFA;VENTOLIN HFA) 108 (90 Base) MCG/ACT inhaler, Inhale 2 puffs into the lungs every 6 (six) hours as needed for wheezing or  shortness of breath., Disp: 1 Inhaler, Rfl: 0 .  Ascorbic Acid 1000 MG TBCR, Take 1 tablet by mouth daily. , Disp: , Rfl:  .  aspirin 81 MG tablet, Take 81 mg by mouth daily. , Disp: , Rfl:  .  benzonatate (TESSALON PERLES) 100 MG capsule, Take 1 capsule (100 mg total) by mouth every 6 (six) hours as needed for cough., Disp: 30 capsule, Rfl: 0 .  budesonide-formoterol (SYMBICORT) 160-4.5 MCG/ACT inhaler, , Disp: , Rfl:  .  calcium carbonate (OS-CAL) 600 MG TABS tablet, Take 600 mg by mouth daily with breakfast. , Disp: , Rfl:  .  metoprolol tartrate (LOPRESSOR) 25 MG tablet, TAKE 1 TABLET BY MOUTH TWICE A DAY, Disp: 180 tablet, Rfl: 1 .  MULTIPLE VITAMIN PO, Take 1 tablet by mouth daily. , Disp: , Rfl:  .  nortriptyline (PAMELOR) 25 MG capsule, TAKE ONE CAPSULE BY MOUTH EVERY EVENING, Disp: 90 capsule, Rfl: 3 .  pantoprazole (PROTONIX) 40 MG tablet, TAKE 1 TABLET BY MOUTH TWICE A DAY, Disp: 180 tablet, Rfl: 3 .  simvastatin (ZOCOR) 40 MG tablet, TAKE 1 TABLET BY MOUTH AT BEDTIME, Disp: 90 tablet, Rfl: 1 .  tobramycin (TOBREX) 0.3 % ophthalmic solution, , Disp: , Rfl:  .  traMADol (ULTRAM) 50 MG tablet, Take 1-2 tablets (50-100 mg total) by mouth every 6 (six) hours as needed., Disp: 60 tablet, Rfl: 5  Patient Care Team: Birdie Sons, MD as PCP - General (Family Medicine) [name] as Consulting Physician (Pulmonary Disease)     Objective:   Vitals: BP 128/74 (BP Location: Left Arm, Patient Position: Sitting, Cuff Size: Normal)   Pulse 80   Temp 98.3 F (36.8 C) (Oral)   Resp 16   Ht 5\' 4"  (1.626 m)   Wt 173 lb (78.5 kg)   BMI 29.70 kg/m   Physical Exam  Constitutional: She is oriented to person, place, and time. She appears well-developed and well-nourished. No distress.  HENT:  Head: Normocephalic and atraumatic.  Right Ear: Tympanic membrane, external ear and ear canal normal.  Left Ear: Tympanic membrane, external ear and ear canal normal.  Nose: Nose normal.    Mouth/Throat: Uvula is midline, oropharynx is clear and moist and mucous membranes are normal. No oropharyngeal exudate.  Eyes: Conjunctivae and EOM are normal. Pupils are equal, round, and reactive to light. Right eye exhibits no discharge. Left eye exhibits no discharge. No scleral icterus.  Neck: Normal range of motion. Neck supple. No JVD present. Carotid bruit is not present. No tracheal deviation present. No thyromegaly present.  Cardiovascular: Normal rate, regular rhythm, normal heart sounds and intact distal pulses.  Exam reveals no gallop and no friction rub.   No murmur heard. Pulmonary/Chest: Effort normal and breath sounds normal. No respiratory distress. She has no decreased breath sounds. She has no wheezes. She has no rhonchi. She has no rales. She exhibits no tenderness.  Abdominal: Soft. Bowel sounds are normal. She exhibits no distension and no mass. There is no tenderness. There is no rebound and no guarding.  Musculoskeletal: Normal range of motion. She exhibits no edema or tenderness.  Lymphadenopathy:    She has no cervical adenopathy.  Neurological: She is alert and oriented to person, place, and time.  Skin: Skin is warm and dry. No rash noted. She is not diaphoretic.  Psychiatric: She has a normal mood and affect. Her behavior is normal. Judgment and thought content normal.  Vitals reviewed.   Activities of Daily Living No flowsheet data found.  Fall Risk Assessment Fall Risk  01/27/2016 01/25/2015  Falls in the past year? No No     Depression Screen PHQ 2/9 Scores 01/27/2016 01/25/2015  PHQ - 2 Score 0 0    Cognitive Testing - 6-CIT  Correct? Score   What year is it? yes 0 0 or 4  What month is it? yes 0 0 or 3  Memorize:    Pia Mau,  44,  [address]  [place],      What time is it? (within 1 hour) yes 0 0 or 3  Count backwards from 20 yes 0 0, 2, or 4  Name the months of the year yes 0 0, 2, or 4  Repeat name & address above no 2 0, 2, 4, 6, 8,  or 10       TOTAL SCORE  2/28   Interpretation:  Normal  Normal (0-7) Abnormal (8-28)       Assessment & Plan:     Annual Wellness Visit  Reviewed patient's Family Medical History Reviewed and updated list of patient's medical providers Assessment of cognitive impairment was done Assessed patient's functional ability Established a written schedule for health screening [place] Completed and Reviewed  Exercise Activities and Dietary recommendations Goals    . Exercise 150 minutes per week (moderate activity)       Immunization History  Administered Date(s) Administered  . Influenza Split 12/01/2005, 11/17/2007  . Influenza-Unspecified 10/06/2013  . Pneumococcal Conjugate-13 12/28/2013  . Pneumococcal Polysaccharide-23 05/31/2003  . Zoster 03/17/2008    Health Maintenance  Topic Date Due  . TETANUS/TDAP  10/15/1956  . INFLUENZA VACCINE  09/06/2015  . DEXA SCAN  Completed  . ZOSTAVAX  Completed  . PNA vac Low Risk Adult  Completed     Discussed health benefits of physical activity, and encouraged her to engage in regular exercise appropriate for her age and condition.    1. Medicare annual wellness visit, subsequent Normal physical exam today.  2. Essential hypertension Stable. Continue metoprolol 25mg . Also followed by Dr. [name]. Will check labs as below and f/u pending results. - CBC w/Diff/Platelet - Comprehensive Metabolic Panel (CMET)  3. Chronic kidney disease (CKD), stage III (moderate) Previously stable. Will check labs as below and f/u pending results. - CBC w/Diff/Platelet - Comprehensive Metabolic Panel (CMET)  4. Pure hypercholesterolemia Stable. Continue simvastatin 40mg . Will check labs as below and f/u pending results. - Lipid Profile  5. Hypothyroidism, unspecified type Previous 2 labs have been borderline low. Will check labs as below and f/u pending results. -  TSH  ------------------------------------------------------------------------------------------------------------  Patient seen and examined by Mar Daring, PA-C, and note scribed by Renaldo Fiddler, CMA.  Mar Daring, PA-C  [facility]

## 2016-01-28 LAB — CBC WITH DIFFERENTIAL/PLATELET
BASOS: 0 %
Basophils Absolute: 0 10*3/uL (ref 0.0–0.2)
EOS (ABSOLUTE): 0.1 10*3/uL (ref 0.0–0.4)
EOS: 1 %
HEMATOCRIT: 40.4 % (ref 34.0–46.6)
Hemoglobin: 13.6 g/dL (ref 11.1–15.9)
Immature Grans (Abs): 0 10*3/uL (ref 0.0–0.1)
Immature Granulocytes: 0 %
LYMPHS ABS: 1.2 10*3/uL (ref 0.7–3.1)
Lymphs: 20 %
MCH: 33.7 pg — AB (ref 26.6–33.0)
MCHC: 33.7 g/dL (ref 31.5–35.7)
MCV: 100 fL — AB (ref 79–97)
MONOS ABS: 0.5 10*3/uL (ref 0.1–0.9)
Monocytes: 8 %
NEUTROS ABS: 4.3 10*3/uL (ref 1.4–7.0)
Neutrophils: 71 %
PLATELETS: 230 10*3/uL (ref 150–379)
RBC: 4.04 x10E6/uL (ref 3.77–5.28)
RDW: 13.2 % (ref 12.3–15.4)
WBC: 6.1 10*3/uL (ref 3.4–10.8)

## 2016-01-28 LAB — COMPREHENSIVE METABOLIC PANEL
ALBUMIN: 4.1 g/dL (ref 3.5–4.8)
ALK PHOS: 82 IU/L (ref 39–117)
ALT: 15 IU/L (ref 0–32)
AST: 19 IU/L (ref 0–40)
Albumin/Globulin Ratio: 1.6 (ref 1.2–2.2)
BILIRUBIN TOTAL: 0.5 mg/dL (ref 0.0–1.2)
BUN / CREAT RATIO: 20 (ref 12–28)
BUN: 24 mg/dL (ref 8–27)
CHLORIDE: 100 mmol/L (ref 96–106)
CO2: 27 mmol/L (ref 18–29)
CREATININE: 1.2 mg/dL — AB (ref 0.57–1.00)
Calcium: 9.3 mg/dL (ref 8.7–10.3)
GFR calc non Af Amer: 43 mL/min/{1.73_m2} — ABNORMAL LOW (ref >59)
GFR, EST AFRICAN AMERICAN: 50 mL/min/{1.73_m2} — AB (ref >59)
GLUCOSE: 97 mg/dL (ref 65–99)
Globulin, Total: 2.5 g/dL (ref 1.5–4.5)
Potassium: 5.3 mmol/L — ABNORMAL HIGH (ref 3.5–5.2)
Sodium: 141 mmol/L (ref 134–144)
TOTAL PROTEIN: 6.6 g/dL (ref 6.0–8.5)

## 2016-01-28 LAB — LIPID PANEL
CHOL/HDL RATIO: 3.3 ratio (ref 0.0–4.4)
Cholesterol, Total: 179 mg/dL (ref 100–199)
HDL: 55 mg/dL (ref >39)
LDL Calculated: 80 mg/dL (ref 0–99)
Triglycerides: 219 mg/dL — ABNORMAL HIGH (ref 0–149)
VLDL Cholesterol Cal: 44 mg/dL — ABNORMAL HIGH (ref 5–40)

## 2016-01-28 LAB — TSH: TSH: 2.63 u[IU]/mL (ref 0.450–4.500)

## 2016-01-31 ENCOUNTER — Telehealth: Payer: Self-pay

## 2016-01-31 NOTE — Telephone Encounter (Signed)
lmtcb Emily Drozdowski, CMA  

## 2016-01-31 NOTE — Telephone Encounter (Signed)
-----   Message from Mar Daring, PA-C sent at 01/31/2016  8:22 AM EST ----- All labs are within normal limits and stable.  Potassium is slightly elevated and kidney function is up slightly. Push fluids and make sure to stay well hydrated. Thanks! -JB

## 2016-02-01 NOTE — Telephone Encounter (Signed)
LMTCB

## 2016-02-02 NOTE — Telephone Encounter (Signed)
lmtcb Emily Drozdowski, CMA  

## 2016-02-03 ENCOUNTER — Encounter: Payer: Self-pay | Admitting: Physician Assistant

## 2016-02-03 NOTE — Telephone Encounter (Signed)
Tried contacting pt several times with no return call. Mailed pt letter. Renaldo Fiddler, CMA

## 2016-02-05 ENCOUNTER — Other Ambulatory Visit: Payer: Self-pay | Admitting: Family Medicine

## 2016-02-07 ENCOUNTER — Encounter: Payer: Self-pay | Admitting: Physician Assistant

## 2016-02-07 ENCOUNTER — Telehealth: Payer: Self-pay

## 2016-02-07 NOTE — Telephone Encounter (Signed)
Patient advised of lab results. sd

## 2016-02-14 DIAGNOSIS — H353213 Exudative age-related macular degeneration, right eye, with inactive scar: Secondary | ICD-10-CM | POA: Diagnosis not present

## 2016-02-14 DIAGNOSIS — H353221 Exudative age-related macular degeneration, left eye, with active choroidal neovascularization: Secondary | ICD-10-CM | POA: Diagnosis not present

## 2016-02-14 DIAGNOSIS — H35423 Microcystoid degeneration of retina, bilateral: Secondary | ICD-10-CM | POA: Diagnosis not present

## 2016-02-14 DIAGNOSIS — H35432 Paving stone degeneration of retina, left eye: Secondary | ICD-10-CM | POA: Diagnosis not present

## 2016-02-19 ENCOUNTER — Other Ambulatory Visit: Payer: Self-pay | Admitting: Family Medicine

## 2016-02-19 DIAGNOSIS — G4700 Insomnia, unspecified: Secondary | ICD-10-CM

## 2016-02-20 DIAGNOSIS — J441 Chronic obstructive pulmonary disease with (acute) exacerbation: Secondary | ICD-10-CM | POA: Diagnosis not present

## 2016-02-20 NOTE — Telephone Encounter (Signed)
Please review-aa 

## 2016-03-14 DIAGNOSIS — G4733 Obstructive sleep apnea (adult) (pediatric): Secondary | ICD-10-CM | POA: Diagnosis not present

## 2016-03-20 DIAGNOSIS — I208 Other forms of angina pectoris: Secondary | ICD-10-CM | POA: Diagnosis not present

## 2016-03-20 DIAGNOSIS — J449 Chronic obstructive pulmonary disease, unspecified: Secondary | ICD-10-CM | POA: Diagnosis not present

## 2016-03-20 DIAGNOSIS — R0989 Other specified symptoms and signs involving the circulatory and respiratory systems: Secondary | ICD-10-CM | POA: Diagnosis not present

## 2016-03-20 DIAGNOSIS — K449 Diaphragmatic hernia without obstruction or gangrene: Secondary | ICD-10-CM | POA: Diagnosis not present

## 2016-03-20 DIAGNOSIS — R0602 Shortness of breath: Secondary | ICD-10-CM | POA: Diagnosis not present

## 2016-03-22 ENCOUNTER — Other Ambulatory Visit: Payer: Self-pay

## 2016-03-22 DIAGNOSIS — K219 Gastro-esophageal reflux disease without esophagitis: Secondary | ICD-10-CM

## 2016-03-22 MED ORDER — PANTOPRAZOLE SODIUM 40 MG PO TBEC: 40.0000 mg | DELAYED_RELEASE_TABLET | Freq: Two times a day (BID) | ORAL | 3 refills | Status: DC

## 2016-03-22 NOTE — Telephone Encounter (Signed)
Pharmacy requesting refill Last ov 01/27/16 Last filled 11/02/14. Please review. Thank you. sd

## 2016-04-02 DIAGNOSIS — R0989 Other specified symptoms and signs involving the circulatory and respiratory systems: Secondary | ICD-10-CM | POA: Diagnosis not present

## 2016-04-02 DIAGNOSIS — I6523 Occlusion and stenosis of bilateral carotid arteries: Secondary | ICD-10-CM | POA: Diagnosis not present

## 2016-04-20 ENCOUNTER — Other Ambulatory Visit: Payer: Self-pay | Admitting: Physician Assistant

## 2016-04-20 DIAGNOSIS — I10 Essential (primary) hypertension: Secondary | ICD-10-CM

## 2016-04-21 ENCOUNTER — Other Ambulatory Visit: Payer: Self-pay | Admitting: Physician Assistant

## 2016-04-21 DIAGNOSIS — I10 Essential (primary) hypertension: Secondary | ICD-10-CM

## 2016-04-23 NOTE — Telephone Encounter (Signed)
Last ov 01/27/16 Last filled 04/20/16 Please review. Thank you. sd

## 2016-04-24 DIAGNOSIS — H353213 Exudative age-related macular degeneration, right eye, with inactive scar: Secondary | ICD-10-CM | POA: Diagnosis not present

## 2016-04-24 DIAGNOSIS — H353221 Exudative age-related macular degeneration, left eye, with active choroidal neovascularization: Secondary | ICD-10-CM | POA: Diagnosis not present

## 2016-04-24 DIAGNOSIS — H43813 Vitreous degeneration, bilateral: Secondary | ICD-10-CM | POA: Diagnosis not present

## 2016-05-03 ENCOUNTER — Other Ambulatory Visit: Payer: Self-pay | Admitting: Family Medicine

## 2016-05-03 DIAGNOSIS — G4733 Obstructive sleep apnea (adult) (pediatric): Secondary | ICD-10-CM | POA: Diagnosis not present

## 2016-05-03 DIAGNOSIS — J449 Chronic obstructive pulmonary disease, unspecified: Secondary | ICD-10-CM | POA: Diagnosis not present

## 2016-05-03 DIAGNOSIS — M1990 Unspecified osteoarthritis, unspecified site: Secondary | ICD-10-CM

## 2016-05-03 DIAGNOSIS — R0683 Snoring: Secondary | ICD-10-CM | POA: Diagnosis not present

## 2016-05-03 DIAGNOSIS — R0602 Shortness of breath: Secondary | ICD-10-CM | POA: Diagnosis not present

## 2016-05-04 NOTE — Telephone Encounter (Signed)
Please call in tramadol.  

## 2016-06-27 DIAGNOSIS — G4733 Obstructive sleep apnea (adult) (pediatric): Secondary | ICD-10-CM | POA: Diagnosis not present

## 2016-07-03 DIAGNOSIS — H353221 Exudative age-related macular degeneration, left eye, with active choroidal neovascularization: Secondary | ICD-10-CM | POA: Diagnosis not present

## 2016-07-03 DIAGNOSIS — H353213 Exudative age-related macular degeneration, right eye, with inactive scar: Secondary | ICD-10-CM | POA: Diagnosis not present

## 2016-07-03 DIAGNOSIS — H43813 Vitreous degeneration, bilateral: Secondary | ICD-10-CM | POA: Diagnosis not present

## 2016-09-04 DIAGNOSIS — H43813 Vitreous degeneration, bilateral: Secondary | ICD-10-CM | POA: Diagnosis not present

## 2016-09-04 DIAGNOSIS — H353213 Exudative age-related macular degeneration, right eye, with inactive scar: Secondary | ICD-10-CM | POA: Diagnosis not present

## 2016-09-04 DIAGNOSIS — H353221 Exudative age-related macular degeneration, left eye, with active choroidal neovascularization: Secondary | ICD-10-CM | POA: Diagnosis not present

## 2016-09-27 ENCOUNTER — Ambulatory Visit (INDEPENDENT_AMBULATORY_CARE_PROVIDER_SITE_OTHER): Payer: Medicare Other | Admitting: Physician Assistant

## 2016-09-27 ENCOUNTER — Encounter: Payer: Self-pay | Admitting: Physician Assistant

## 2016-09-27 ENCOUNTER — Telehealth: Payer: Self-pay

## 2016-09-27 VITALS — BP 120/66 | HR 80 | Temp 98.1°F | Resp 16 | Wt 172.4 lb

## 2016-09-27 DIAGNOSIS — I700 Atherosclerosis of aorta: Secondary | ICD-10-CM | POA: Diagnosis not present

## 2016-09-27 DIAGNOSIS — D126 Benign neoplasm of colon, unspecified: Secondary | ICD-10-CM | POA: Diagnosis not present

## 2016-09-27 DIAGNOSIS — K648 Other hemorrhoids: Secondary | ICD-10-CM

## 2016-09-27 DIAGNOSIS — R0989 Other specified symptoms and signs involving the circulatory and respiratory systems: Secondary | ICD-10-CM

## 2016-09-27 DIAGNOSIS — K625 Hemorrhage of anus and rectum: Secondary | ICD-10-CM | POA: Diagnosis not present

## 2016-09-27 MED ORDER — HYDROCORTISONE 2.5 % RE CREA: 1.0000 "application " | TOPICAL_CREAM | Freq: Two times a day (BID) | RECTAL | 0 refills | Status: DC

## 2016-09-27 NOTE — Progress Notes (Signed)
Patient: Christina [name] Female    DOB: 1935/02/22   81 y.o.   MRN: [id number] Visit Date: 09/27/2016  Today's Provider: Mar Daring, PA-C   Chief Complaint  Patient presents with  . Rectal Bleeding   Subjective:    HPI Patient here today C/O rectal bleeding 3 days this week. Patient reports that bleeding stopped completely yesterday afternoon. Patient reports she is feeling weak this week, not sure if the bleeding is causing her fatigue. Patient reports she does suffer from constipation and does not have a bowel movement every day. Patient reports lower abdominal pain. Patient denies blood in her stools. Patient reports that when she drinks coke her stools are "black" and reports that after drinking water her stools are normal. She does have history of diverticulosis (no history of diverticulitis), tubular adenoma and internal hemorrhoids. She reports she has had bleeding internal hemorrhoids in the past and it has always been self-limiting. This time it has lasted a little longer. She does report having BRB on stool and in toilet after BM. She does have hard stools, but does go daily. Last colonoscopy was done in 2015 with Dr. Vira Agar. No colon cancer family history.     Allergies  Allergen Reactions  . Baclofen Other (See Comments)  . Codeine   . Sulfa Antibiotics   . Latex Rash     Current Outpatient Prescriptions:  .  acetaminophen (TYLENOL) 500 MG tablet, Take 500 mg by mouth every 8 (eight) hours as needed. , Disp: , Rfl:  .  albuterol (PROVENTIL HFA;VENTOLIN HFA) 108 (90 Base) MCG/ACT inhaler, Inhale 2 puffs into the lungs every 6 (six) hours as needed for wheezing or shortness of breath., Disp: 1 Inhaler, Rfl: 0 .  Ascorbic Acid 1000 MG TBCR, Take 1 tablet by mouth daily. , Disp: , Rfl:  .  aspirin 81 MG tablet, Take 81 mg by mouth daily. , Disp: , Rfl:  .  budesonide-formoterol (SYMBICORT) 160-4.5 MCG/ACT inhaler, , Disp: , Rfl:  .  calcium carbonate  (OS-CAL) 600 MG TABS tablet, Take 600 mg by mouth daily with breakfast. , Disp: , Rfl:  .  metoprolol tartrate (LOPRESSOR) 25 MG tablet, TAKE 1 TABLET BY MOUTH TWICE A DAY, Disp: 180 tablet, Rfl: 1 .  MULTIPLE VITAMIN PO, Take 1 tablet by mouth daily. , Disp: , Rfl:  .  nortriptyline (PAMELOR) 25 MG capsule, TAKE ONE CAPSULE BY MOUTH EVERY EVENING, Disp: 90 capsule, Rfl: 3 .  pantoprazole (PROTONIX) 40 MG tablet, Take 1 tablet (40 mg total) by mouth 2 (two) times daily., Disp: 180 tablet, Rfl: 3 .  simvastatin (ZOCOR) 40 MG tablet, TAKE 1 TABLET BY MOUTH AT BEDTIME, Disp: 90 tablet, Rfl: 3 .  tobramycin (TOBREX) 0.3 % ophthalmic solution, , Disp: , Rfl:  .  traMADol (ULTRAM) 50 MG tablet, TAKE 1 TO 2 TABLETS BY MOUTH EVERY 6 HOURS AS NEEDED, Disp: 60 tablet, Rfl: 5  Review of Systems  Constitutional: Positive for fatigue.  HENT: Negative.   Respiratory: Negative.   Cardiovascular: Negative.   Gastrointestinal: Positive for abdominal pain, anal bleeding, constipation and rectal pain.  Neurological: Negative.     Social History  Substance Use Topics  . Smoking status: Former Smoker    Packs/day: 0.50    Years: 5.00    Quit date: 04/24/2004  . Smokeless tobacco: Never Used  . Alcohol use No   Objective:   BP 120/66 (BP Location: Left Arm, Patient Position:  Sitting, Cuff Size: Large)   Pulse 80   Temp 98.1 F (36.7 C) (Oral)   Resp 16   Wt 172 lb 6.4 oz (78.2 kg)   SpO2 97%   BMI 29.59 kg/m  Vitals:   09/27/16 1108  BP: 120/66  Pulse: 80  Resp: 16  Temp: 98.1 F (36.7 C)  TempSrc: Oral  SpO2: 97%  Weight: 172 lb 6.4 oz (78.2 kg)     Physical Exam  Constitutional: She is oriented to person, place, and time. She appears well-developed and well-nourished. No distress.  Cardiovascular: Normal rate, regular rhythm and normal heart sounds.  Exam reveals no gallop and no friction rub.   No murmur heard. Pulmonary/Chest: Effort normal and breath sounds normal. No respiratory  distress. She has no wheezes. She has no rales.  Abdominal: Soft. Normal appearance and bowel sounds are normal. She exhibits abdominal bruit. She exhibits no distension and no mass. There is no hepatosplenomegaly. There is tenderness in the right lower quadrant, periumbilical area, suprapubic area and left lower quadrant. There is no rebound, no guarding and no CVA tenderness.  Genitourinary: Rectal exam shows external hemorrhoid and internal hemorrhoid. Rectal exam shows no fissure, no mass, no tenderness, anal tone normal and guaiac negative stool.     Neurological: She is alert and oriented to person, place, and time.  Skin: Skin is warm and dry. She is not diaphoretic.  Vitals reviewed.      Assessment & Plan:     1. Rectal bleeding OC lite collected today and was negative. Rectal bleeding subsided yesterday. Will check CBC as below for patient. Anusol rectal cream Rx for patient as below. Advised of good bowel routine, start stool softener. She is to call the office if rectal bleeding restarts and will refer to GI for further evaluation since patient did have one tubular adenoma removed at last colonoscopy.  - CBC w/Diff/Platelet - IFOBT POC (occult bld, rslt in office); Future  2. Internal hemorrhoid, bleeding See above medical treatment plan. - hydrocortisone (ANUSOL-HC) 2.5 % rectal cream; Place 1 application rectally 2 (two) times daily.  Dispense: 30 g; Refill: 0  3. Abdominal aortic atherosclerosis ([place]) Patient had abdominal aortic bruit today on exam. Previous CT abdomen showed severe atherosclerosis. No previous documentation of bruit. Patient is on simvastatin and 81 mg ASA. Will refer to Vascular surgery for further evaluation. Possible claudication of right gluteal region per patient. No other claudication or mesenteric symptoms. Patient has never had abdominal atherosclerosis evaluated. She did have cardiac stress test with Dr. [name] last year.  4. Abdominal bruit See  above medical treatment plan.  5. Adenomatous polyp of colon, unspecified part of colon See above medical treatment plan for #1.        Mar Daring, PA-C  [facility]

## 2016-09-27 NOTE — Patient Instructions (Signed)
Hemorrhoids Hemorrhoids are swollen veins in and around the rectum or anus. There are two types of hemorrhoids:  Internal hemorrhoids. These occur in the veins that are just inside the rectum. They may poke through to the outside and become irritated and painful.  External hemorrhoids. These occur in the veins that are outside of the anus and can be felt as a painful swelling or hard lump near the anus.  Most hemorrhoids do not cause serious problems, and they can be managed with home treatments such as diet and lifestyle changes. If home treatments do not help your symptoms, procedures can be done to shrink or remove the hemorrhoids. What are the causes? This condition is caused by increased pressure in the anal area. This pressure may result from various things, including:  Constipation.  Straining to have a bowel movement.  Diarrhea.  Pregnancy.  Obesity.  Sitting for long periods of time.  Heavy lifting or other activity that causes you to strain.  Anal sex.  What are the signs or symptoms? Symptoms of this condition include:  Pain.  Anal itching or irritation.  Rectal bleeding.  Leakage of stool (feces).  Anal swelling.  One or more lumps around the anus.  How is this diagnosed? This condition can often be diagnosed through a visual exam. Other exams or tests may also be done, such as:  Examination of the rectal area with a gloved hand (digital rectal exam).  Examination of the anal canal using a small tube (anoscope).  A blood test, if you have lost a significant amount of blood.  A test to look inside the colon (sigmoidoscopy or colonoscopy).  How is this treated? This condition can usually be treated at home. However, various procedures may be done if dietary changes, lifestyle changes, and other home treatments do not help your symptoms. These procedures can help make the hemorrhoids smaller or remove them completely. Some of these procedures involve  surgery, and others do not. Common procedures include:  Rubber band ligation. Rubber bands are placed at the base of the hemorrhoids to cut off the blood supply to them.  Sclerotherapy. Medicine is injected into the hemorrhoids to shrink them.  Infrared coagulation. A type of light energy is used to get rid of the hemorrhoids.  Hemorrhoidectomy surgery. The hemorrhoids are surgically removed, and the veins that supply them are tied off.  Stapled hemorrhoidopexy surgery. A circular stapling device is used to remove the hemorrhoids and use staples to cut off the blood supply to them.  Follow these instructions at home: Eating and drinking  Eat foods that have a lot of fiber in them, such as whole grains, beans, nuts, fruits, and vegetables. Ask your health care provider about taking products that have added fiber (fiber supplements).  Drink enough fluid to keep your urine clear or pale yellow. Managing pain and swelling  Take warm sitz baths for 20 minutes, 3-4 times a day to ease pain and discomfort.  If directed, apply ice to the affected area. Using ice packs between sitz baths may be helpful. ? Put ice in a plastic bag. ? Place a towel between your skin and the bag. ? Leave the ice on for 20 minutes, 2-3 times a day. General instructions  Take over-the-counter and prescription medicines only as told by your health care provider.  Use medicated creams or suppositories as told.  Exercise regularly.  Go to the bathroom when you have the urge to have a bowel movement. Do not wait.    Avoid straining to have bowel movements.  Keep the anal area dry and clean. Use wet toilet paper or moist towelettes after a bowel movement.  Do not sit on the toilet for long periods of time. This increases blood pooling and pain. Contact a health care provider if:  You have increasing pain and swelling that are not controlled by treatment or medicine.  You have uncontrolled bleeding.  You  have difficulty having a bowel movement, or you are unable to have a bowel movement.  You have pain or inflammation outside the area of the hemorrhoids. This information is not intended to replace advice given to you by your health care provider. Make sure you discuss any questions you have with your health care provider. Document Released: 01/20/2000 Document Revised: 06/22/2015 Document Reviewed: 10/06/2014 Elsevier Interactive Patient Education  2017 [employer]  

## 2016-09-27 NOTE — Telephone Encounter (Signed)
Patient C/O rectal bleeding times one week. Patient reports it has happened in the past. Patient reports that she does suffer from constipation. Patient reports having to wear a pad. Patient reports that bleed has decreased today. Patient reports being weak and would like to have hemoglobin checked. sd

## 2016-09-28 ENCOUNTER — Telehealth: Payer: Self-pay

## 2016-09-28 LAB — CBC WITH DIFFERENTIAL/PLATELET
Basophils Absolute: 0 10*3/uL (ref 0.0–0.2)
Basos: 0 %
EOS (ABSOLUTE): 0.1 10*3/uL (ref 0.0–0.4)
Eos: 2 %
HEMATOCRIT: 41.2 % (ref 34.0–46.6)
HEMOGLOBIN: 13 g/dL (ref 11.1–15.9)
Immature Grans (Abs): 0 10*3/uL (ref 0.0–0.1)
Immature Granulocytes: 0 %
LYMPHS: 23 %
Lymphocytes Absolute: 1.5 10*3/uL (ref 0.7–3.1)
MCH: 32.7 pg (ref 26.6–33.0)
MCHC: 31.6 g/dL (ref 31.5–35.7)
MCV: 104 fL — AB (ref 79–97)
Monocytes Absolute: 0.5 10*3/uL (ref 0.1–0.9)
Monocytes: 8 %
NEUTROS ABS: 4.3 10*3/uL (ref 1.4–7.0)
Neutrophils: 67 %
Platelets: 201 10*3/uL (ref 150–379)
RBC: 3.98 x10E6/uL (ref 3.77–5.28)
RDW: 13.3 % (ref 12.3–15.4)
WBC: 6.4 10*3/uL (ref 3.4–10.8)

## 2016-09-28 NOTE — Telephone Encounter (Signed)
LMTCB  Thanks,  -Shaquilla Kehres 

## 2016-09-28 NOTE — Telephone Encounter (Signed)
-----   Message from Mar Daring, PA-C sent at 09/28/2016  8:27 AM EDT ----- Hemoglobin is stable at 13.0. No anemia.

## 2016-09-28 NOTE — Telephone Encounter (Signed)
Pt advised-aa 

## 2016-10-17 DIAGNOSIS — R0602 Shortness of breath: Secondary | ICD-10-CM | POA: Diagnosis not present

## 2016-10-17 LAB — PULMONARY FUNCTION TEST

## 2016-10-21 ENCOUNTER — Other Ambulatory Visit: Payer: Self-pay | Admitting: Physician Assistant

## 2016-10-21 DIAGNOSIS — I10 Essential (primary) hypertension: Secondary | ICD-10-CM

## 2016-10-22 ENCOUNTER — Encounter: Payer: Self-pay | Admitting: Family Medicine

## 2016-10-22 DIAGNOSIS — J441 Chronic obstructive pulmonary disease with (acute) exacerbation: Secondary | ICD-10-CM | POA: Insufficient documentation

## 2016-10-22 DIAGNOSIS — J449 Chronic obstructive pulmonary disease, unspecified: Secondary | ICD-10-CM | POA: Insufficient documentation

## 2016-10-23 NOTE — Telephone Encounter (Signed)
Please review for [employer], RMA

## 2016-10-26 ENCOUNTER — Ambulatory Visit (INDEPENDENT_AMBULATORY_CARE_PROVIDER_SITE_OTHER): Payer: Self-pay | Admitting: Vascular Surgery

## 2016-10-26 ENCOUNTER — Encounter (INDEPENDENT_AMBULATORY_CARE_PROVIDER_SITE_OTHER): Payer: Self-pay | Admitting: Vascular Surgery

## 2016-10-26 VITALS — BP 185/80 | HR 80 | Resp 14 | Ht 65.5 in | Wt 173.0 lb

## 2016-10-26 DIAGNOSIS — E7800 Pure hypercholesterolemia, unspecified: Secondary | ICD-10-CM

## 2016-10-26 DIAGNOSIS — I2510 Atherosclerotic heart disease of native coronary artery without angina pectoris: Secondary | ICD-10-CM | POA: Diagnosis not present

## 2016-10-26 DIAGNOSIS — I739 Peripheral vascular disease, unspecified: Secondary | ICD-10-CM | POA: Diagnosis not present

## 2016-10-26 DIAGNOSIS — I10 Essential (primary) hypertension: Secondary | ICD-10-CM | POA: Diagnosis not present

## 2016-10-26 DIAGNOSIS — I700 Atherosclerosis of aorta: Secondary | ICD-10-CM | POA: Insufficient documentation

## 2016-10-26 NOTE — Patient Instructions (Signed)

## 2016-10-26 NOTE — Assessment & Plan Note (Signed)
lipid control important in reducing the progression of atherosclerotic disease. Continue statin therapy  

## 2016-10-26 NOTE — Assessment & Plan Note (Signed)
This was noted on a CT scan 3 years ago. I think an aortoiliac duplex be prudent at this point for further evaluation. We are also checking lower extremity arterial studies and ABIs given her right lower extremity claudication symptoms. We discussed the pathophysiology and natural history of peripheral arterial disease. She should continue on her aspirin and statin agent. I will see her back following her studies.

## 2016-10-26 NOTE — Assessment & Plan Note (Signed)
blood pressure control important in reducing the progression of atherosclerotic disease. On appropriate oral medications.  

## 2016-10-26 NOTE — Assessment & Plan Note (Signed)
S/p CABG 

## 2016-10-26 NOTE — Progress Notes (Signed)
Patient ID: Christina [name], female   DOB: 1935/09/04, 81 y.o.   MRN: [id number]  Chief Complaint  Patient presents with  . New Patient (Initial Visit)    AAA    HPI Christina [name] is a 81 y.o. female.  I am asked to see the patient by Joette Catching for evaluation of aortic atherosclerosis.  The patient reports Pain in her right leg with activity. This comes on a reasonably short distances of walking and limits her ambulation activity significantly. This does not really affect the left lower extremity that much. She had a CT scan of the abdomen and pelvis from 3 years ago which reported significant aortic atherosclerosis at that time. Those images were archived and I cannot review them today, but I have seen the interpretation. She did not have an aneurysm on that study. She does not think she has had her blood flow checked since that time. Her primary care provider noted an abdominal bruit at her last visit in particularly given her claudication symptoms recommended she have this evaluated further.   Past Medical History:  Diagnosis Date  . Hypertension     Past Surgical History:  Procedure Laterality Date  . ABDOMINAL HYSTERECTOMY  1980   due to dysfunctional uterine bleeding  . APPENDECTOMY  1980  . BREAST SURGERY  2000   biopsy  . CORONARY ARTERY BYPASS GRAFT    . HEMORRHOID SURGERY    . LUNG BIOPSY  1999   Negative    Family History  Problem Relation Age of Onset  . Hypertension Mother   . Hyperlipidemia Mother   . Alzheimer's disease Mother   . CAD Mother   . Heart attack Father   . Lung disease Sister   . Heart disease Sister   . Diabetes Paternal Grandfather        Type 2     Social History Social History  Substance Use Topics  . Smoking status: Former Smoker    Packs/day: 0.50    Years: 5.00    Quit date: 04/24/2004  . Smokeless tobacco: Never Used  . Alcohol use No  No IVDU  Allergies  Allergen Reactions  . Baclofen Other (See Comments)  .  Codeine   . Sulfa Antibiotics   . Latex Rash    Current Outpatient Prescriptions  Medication Sig Dispense Refill  . acetaminophen (TYLENOL) 500 MG tablet Take 500 mg by mouth every 8 (eight) hours as needed.     Marland Kitchen albuterol (PROVENTIL HFA;VENTOLIN HFA) 108 (90 Base) MCG/ACT inhaler Inhale 2 puffs into the lungs every 6 (six) hours as needed for wheezing or shortness of breath. 1 Inhaler 0  . Ascorbic Acid 1000 MG TBCR Take 1 tablet by mouth daily.     Marland Kitchen aspirin 81 MG tablet Take 81 mg by mouth daily.     . budesonide-formoterol (SYMBICORT) 160-4.5 MCG/ACT inhaler     . calcium carbonate (OS-CAL) 600 MG TABS tablet Take 600 mg by mouth daily with breakfast.     . FLUZONE HIGH-DOSE 0.5 ML injection     . hydrocortisone (ANUSOL-HC) 2.5 % rectal cream Place 1 application rectally 2 (two) times daily. 30 g 0  . metoprolol tartrate (LOPRESSOR) 25 MG tablet TAKE 1 TABLET BY MOUTH TWICE A DAY 180 tablet 1  . MULTIPLE VITAMIN PO Take 1 tablet by mouth daily.     . nortriptyline (PAMELOR) 25 MG capsule TAKE ONE CAPSULE BY MOUTH EVERY EVENING 90 capsule 3  .  pantoprazole (PROTONIX) 40 MG tablet Take 1 tablet (40 mg total) by mouth 2 (two) times daily. 180 tablet 3  . simvastatin (ZOCOR) 40 MG tablet TAKE 1 TABLET BY MOUTH AT BEDTIME 90 tablet 3  . tobramycin (TOBREX) 0.3 % ophthalmic solution     . traMADol (ULTRAM) 50 MG tablet TAKE 1 TO 2 TABLETS BY MOUTH EVERY 6 HOURS AS NEEDED 60 tablet 5   No current facility-administered medications for this visit.       REVIEW OF SYSTEMS (Negative unless checked)  Constitutional: [] Weight loss  [] Fever  [] Chills Cardiac: [] Chest pain   [] Chest pressure   [] Palpitations   [] Shortness of breath when laying flat   [] Shortness of breath at rest   [] Shortness of breath with exertion. Vascular:  [x] Pain in legs with walking   [] Pain in legs at rest   [] Pain in legs when laying flat   [x] Claudication   [] Pain in feet when walking  [] Pain in feet at rest  [] Pain  in feet when laying flat   [] History of DVT   [] Phlebitis   [x] Swelling in legs   [] Varicose veins   [] Non-healing ulcers Pulmonary:   [] Uses home oxygen   [] Productive cough   [] Hemoptysis   [] Wheeze  [] COPD   [] Asthma Neurologic:  [] Dizziness  [] Blackouts   [] Seizures   [] History of stroke   [] History of TIA  [] Aphasia   [] Temporary blindness   [] Dysphagia   [] Weakness or numbness in arms   [] Weakness or numbness in legs Musculoskeletal:  [x] Arthritis   [] Joint swelling   [] Joint pain   [x] Low back pain Hematologic:  [] Easy bruising  [] Easy bleeding   [] Hypercoagulable state   [] Anemic  [] Hepatitis Gastrointestinal:  [] Blood in stool   [] Vomiting blood  [] Gastroesophageal reflux/heartburn   [] Abdominal pain Genitourinary:  [] Chronic kidney disease   [] Difficult urination  [] Frequent urination  [] Burning with urination   [] Hematuria Skin:  [] Rashes   [] Ulcers   [] Wounds Psychological:  [] History of anxiety   []  History of major depression.    Physical Exam BP (!) 185/80 (BP Location: Right Arm)   Pulse 80   Resp 14   Ht 5' 5.5" (1.664 m)   Wt 173 lb (78.5 kg)   BMI 28.35 kg/m  Gen:  WD/WN, NAD Head: [place]/AT, No temporalis wasting.  Ear/Nose/Throat: Hearing grossly intact, nares w/o erythema or drainage, oropharynx w/o Erythema/Exudate Eyes: Conjunctiva clear, sclera non-icteric  Neck: trachea midline.  No JVD.  Pulmonary:  Good air movement, respirations not labored, no use of accessory muscles Cardiac: RRR Vascular:  Vessel Right Left  Radial Palpable Palpable                          PT 1+ Palpable 1+ Palpable  DP 1+ Palpable Palpable   Gastrointestinal: soft, non-tender/non-distended. Musculoskeletal: M/S 5/5 throughout.  Extremities without ischemic changes.  No deformity or atrophy. Trace LE edema. Neurologic: Sensation grossly intact in extremities.  Symmetrical.  Speech is fluent. Motor exam as listed above. Psychiatric: Judgment intact, Mood & affect appropriate for  pt's clinical situation. Dermatologic: No rashes or ulcers noted.  No cellulitis or open wounds.  Radiology No results found.  Labs Recent Results (from the past 2160 hour(s))  CBC w/Diff/Platelet     Status: Abnormal   Collection Time: 09/27/16 11:56 AM  Result Value Ref Range   WBC 6.4 3.4 - 10.8 x10E3/uL   RBC 3.98 3.77 - 5.28 x10E6/uL   Hemoglobin 13.0  11.1 - 15.9 g/dL   Hematocrit 41.2 34.0 - 46.6 %   MCV 104 (H) 79 - 97 fL   MCH 32.7 26.6 - 33.0 pg   MCHC 31.6 31.5 - 35.7 g/dL   RDW 13.3 12.3 - 15.4 %   Platelets 201 150 - 379 x10E3/uL   Neutrophils 67 Not Estab. %   Lymphs 23 Not Estab. %   Monocytes 8 Not Estab. %   Eos 2 Not Estab. %   Basos 0 Not Estab. %   Neutrophils Absolute 4.3 1.4 - 7.0 x10E3/uL   Lymphocytes Absolute 1.5 0.7 - 3.1 x10E3/uL   Monocytes Absolute 0.5 0.1 - 0.9 x10E3/uL   EOS (ABSOLUTE) 0.1 0.0 - 0.4 x10E3/uL   Basophils Absolute 0.0 0.0 - 0.2 x10E3/uL   Immature Granulocytes 0 Not Estab. %   Immature Grans (Abs) 0.0 0.0 - 0.1 x10E3/uL    Assessment/Plan:  Hypertension blood pressure control important in reducing the progression of atherosclerotic disease. On appropriate oral medications.   Atherosclerosis of coronary artery S/p CABG  HLD (hyperlipidemia) lipid control important in reducing the progression of atherosclerotic disease. Continue statin therapy   Aortic atherosclerosis ([place]) This was noted on a CT scan 3 years ago. I think an aortoiliac duplex be prudent at this point for further evaluation. We are also checking lower extremity arterial studies and ABIs given her right lower extremity claudication symptoms. We discussed the pathophysiology and natural history of peripheral arterial disease. She should continue on her aspirin and statin agent. I will see her back following her studies.  Claudication [facility]) Although neurogenic claudication is certainly a possibility, arterial insufficiency has to be considered on a patient with  multiple atherosclerotic risk factors a previous history noted for aortic atherosclerosis several years ago. This was noted on a CT scan 3 years ago. I think an aortoiliac duplex be prudent at this point for further evaluation. We are also checking lower extremity arterial studies and ABIs given her right lower extremity claudication symptoms. We discussed the pathophysiology and natural history of peripheral arterial disease. She should continue on her aspirin and statin agent. I will see her back following her studies.      Leotis Pain 10/26/2016, 4:44 PM   This note was created with Dragon medical transcription system.  Any errors from dictation are unintentional.

## 2016-10-26 NOTE — Assessment & Plan Note (Signed)
Although neurogenic claudication is certainly a possibility, arterial insufficiency has to be considered on a patient with multiple atherosclerotic risk factors a previous history noted for aortic atherosclerosis several years ago. This was noted on a CT scan 3 years ago. I think an aortoiliac duplex be prudent at this point for further evaluation. We are also checking lower extremity arterial studies and ABIs given her right lower extremity claudication symptoms. We discussed the pathophysiology and natural history of peripheral arterial disease. She should continue on her aspirin and statin agent. I will see her back following her studies.

## 2016-11-06 DIAGNOSIS — H43813 Vitreous degeneration, bilateral: Secondary | ICD-10-CM | POA: Diagnosis not present

## 2016-11-06 DIAGNOSIS — H353221 Exudative age-related macular degeneration, left eye, with active choroidal neovascularization: Secondary | ICD-10-CM | POA: Diagnosis not present

## 2016-11-06 DIAGNOSIS — H35432 Paving stone degeneration of retina, left eye: Secondary | ICD-10-CM | POA: Diagnosis not present

## 2016-11-06 DIAGNOSIS — H353213 Exudative age-related macular degeneration, right eye, with inactive scar: Secondary | ICD-10-CM | POA: Diagnosis not present

## 2016-11-28 ENCOUNTER — Ambulatory Visit (INDEPENDENT_AMBULATORY_CARE_PROVIDER_SITE_OTHER): Payer: Self-pay | Admitting: Vascular Surgery

## 2016-11-28 ENCOUNTER — Encounter (INDEPENDENT_AMBULATORY_CARE_PROVIDER_SITE_OTHER): Payer: Self-pay

## 2016-11-29 ENCOUNTER — Other Ambulatory Visit: Payer: Self-pay | Admitting: Physician Assistant

## 2016-11-29 MED ORDER — NORTRIPTYLINE HCL 25 MG PO CAPS: 25.0000 mg | ORAL_CAPSULE | Freq: Every evening | ORAL | 3 refills | Status: DC

## 2016-11-29 NOTE — Telephone Encounter (Signed)
[facility] pharmacy faxed a request for a 90-days supply on the following medication. Qty : 90   Thanks CC  nortriptyline (PAMELOR) 25 MG capsule  >Take one capsule by mouth every evening

## 2016-12-05 DIAGNOSIS — G4733 Obstructive sleep apnea (adult) (pediatric): Secondary | ICD-10-CM | POA: Diagnosis not present

## 2016-12-07 DIAGNOSIS — G4733 Obstructive sleep apnea (adult) (pediatric): Secondary | ICD-10-CM | POA: Diagnosis not present

## 2016-12-10 ENCOUNTER — Ambulatory Visit (INDEPENDENT_AMBULATORY_CARE_PROVIDER_SITE_OTHER): Payer: Medicare Other | Admitting: Vascular Surgery

## 2016-12-10 ENCOUNTER — Ambulatory Visit (INDEPENDENT_AMBULATORY_CARE_PROVIDER_SITE_OTHER): Payer: Medicare Other

## 2016-12-10 ENCOUNTER — Encounter (INDEPENDENT_AMBULATORY_CARE_PROVIDER_SITE_OTHER): Payer: Self-pay | Admitting: Vascular Surgery

## 2016-12-10 VITALS — BP 158/79 | HR 73 | Resp 16 | Ht 65.5 in | Wt 172.8 lb

## 2016-12-10 DIAGNOSIS — I739 Peripheral vascular disease, unspecified: Secondary | ICD-10-CM

## 2016-12-10 DIAGNOSIS — E785 Hyperlipidemia, unspecified: Secondary | ICD-10-CM | POA: Diagnosis not present

## 2016-12-10 DIAGNOSIS — E7800 Pure hypercholesterolemia, unspecified: Secondary | ICD-10-CM

## 2016-12-10 DIAGNOSIS — I2510 Atherosclerotic heart disease of native coronary artery without angina pectoris: Secondary | ICD-10-CM | POA: Diagnosis not present

## 2016-12-10 HISTORY — DX: Peripheral vascular disease, unspecified: I73.9

## 2016-12-10 NOTE — Progress Notes (Signed)
Subjective:    Patient ID: Christina [name], female    DOB: 03/03/1935, 81 y.o.   MRN: [id number] Chief Complaint  Patient presents with  . Follow-up    pt conv Aorta Iliac,rle art,abi   Patient presents to review vascular studies. She was last seen on 10/26/2016 in evaluation for right lower extremity claudication. The patient continues to experience this claudication. It is lifestyle limiting. She denies any rest pain or ulceration to the lower extremity.The patient underwent a bilateral ABI which was notable for right ankle brachial index suggesting moderate right lower extremity arterial occlusive disease.No evidence of significant left lower extremity arterial disease based on the left ankle brachial index. Right biphasic tibials. Left triphasic tibials. The patient underwent an aortoiliac arterial duplex exam which was notable for Doppler velocities suggesting a greater than 50% stenosis of the right proximal common iliac artery. The left common iliac artery was not adequately visualized however the left external iliac artery Doppler waveforms and ABI are suggestive of no hemodynamically significant stenosis. No significant stenosis in the abdominal aorta or bilateral external iliac stents. Right biphasic common iliac artery. Right monophasic external iliac arteries. Left biphasic external iliac arteries. Patient denies any fever, nausea or vomiting.   Review of Systems  Constitutional: Negative.   HENT: Negative.   Eyes: Negative.   Respiratory: Negative.   Cardiovascular:       Claudication  Gastrointestinal: Negative.   Endocrine: Negative.   Genitourinary: Negative.   Musculoskeletal: Negative.   Skin: Negative.   Allergic/Immunologic: Negative.   Neurological: Negative.   Hematological: Negative.   Psychiatric/Behavioral: Negative.       Objective:   Physical Exam  Constitutional: She is oriented to person, place, and time. She appears well-developed and well-nourished.  No distress.  HENT:  Head: Normocephalic and atraumatic.  Eyes: Conjunctivae are normal. Pupils are equal, round, and reactive to light.  Neck: Normal range of motion.  Cardiovascular: Normal rate, regular rhythm, normal heart sounds and intact distal pulses.  Pulses:      Radial pulses are 2+ on the right side, and 2+ on the left side.       Dorsalis pedis pulses are 1+ on the right side, and 1+ on the left side.       Posterior tibial pulses are 1+ on the right side, and 1+ on the left side.  Pulmonary/Chest: Effort normal.  Musculoskeletal: Normal range of motion. She exhibits no edema.  Neurological: She is alert and oriented to person, place, and time.  Skin: Skin is dry. She is not diaphoretic.  Psychiatric: She has a normal mood and affect. Her behavior is normal. Judgment and thought content normal.  Vitals reviewed.  BP (!) 158/79 (BP Location: Right Arm)   Pulse 73   Resp 16   Ht 5' 5.5" (1.664 m)   Wt 172 lb 12.8 oz (78.4 kg)   BMI 28.32 kg/m   Past Medical History:  Diagnosis Date  . Hypertension    Social History   Socioeconomic History  . Marital status: Married    Spouse name: Cliffton Mathis Bud  . Number of children: 2  . Years of education: 87  . Highest education level: Not on file  Social Needs  . Financial resource strain: Not on file  . Food insecurity - worry: Not on file  . Food insecurity - inability: Not on file  . Transportation needs - medical: Not on file  . Transportation needs - non-medical: Not on file  Occupational History  . Occupation: retired  Tobacco Use  . Smoking status: Former Smoker    Packs/day: 0.50    Years: 5.00    Pack years: 2.50    Last attempt to quit: 04/24/2004    Years since quitting: 12.6  . Smokeless tobacco: Never Used  Substance and Sexual Activity  . Alcohol use: No  . Drug use: No  . Sexual activity: Yes  Other Topics Concern  . Not on file  Social History Narrative  . Not on file   Past Surgical  History:  Procedure Laterality Date  . ABDOMINAL HYSTERECTOMY  1980   due to dysfunctional uterine bleeding  . APPENDECTOMY  1980  . BREAST SURGERY  2000   biopsy  . CORONARY ARTERY BYPASS GRAFT    . HEMORRHOID SURGERY    . LUNG BIOPSY  1999   Negative   Family History  Problem Relation Age of Onset  . Hypertension Mother   . Hyperlipidemia Mother   . Alzheimer's disease Mother   . CAD Mother   . Heart attack Father   . Lung disease Sister   . Heart disease Sister   . Diabetes Paternal Grandfather        Type 2   Allergies  Allergen Reactions  . Baclofen Other (See Comments)  . Codeine   . Sulfa Antibiotics   . Latex Rash      Assessment & Plan:  Patient presents to review vascular studies. She was last seen on 10/26/2016 in evaluation for right lower extremity claudication. The patient continues to experience this claudication. It is lifestyle limiting. She denies any rest pain or ulceration to the lower extremity.The patient underwent a bilateral ABI which was notable for right ankle brachial index suggesting moderate right lower extremity arterial occlusive disease.No evidence of significant left lower extremity arterial disease based on the left ankle brachial index. Right biphasic tibials. Left triphasic tibials. The patient underwent an aortoiliac arterial duplex exam which was notable for Doppler velocities suggesting a greater than 50% stenosis of the right proximal common iliac artery. The left common iliac artery was not adequately visualized however the left external iliac artery Doppler waveforms and ABI are suggestive of no hemodynamically significant stenosis. No significant stenosis in the abdominal aorta or bilateral external iliac stents. Right biphasic common iliac artery. Right monophasic external iliac arteries. Left biphasic external iliac arteries. Patient denies any fever, nausea or vomiting.  1. PAD (peripheral artery disease) ([place]) - Stable Patient  presents with lifestyle limiting claudication to the right proximal thigh. Peripheral artery disease seen on ABI and aortoiliac duplex exam. Faint pedal pulses to the right lower extremity. Recommend a right lower extremity angiogram with possible intervention in an effort to revascularize the leg and improve the patient's claudication We had a long discussion about angiograms, the procedure benefits and risks. At this time, the patient would like to follow up in 6 months. If the patient changes her mind we will place her on the schedule for right lower extremity angiogram.  2. Pure hypercholesterolemia - Stable Encouraged good control as its slows the progression of atherosclerotic disease  3. Hyperlipidemia, unspecified hyperlipidemia type - Stable Encouraged good control as its slows the progression of atherosclerotic disease  Current Outpatient Medications on File Prior to Visit  Medication Sig Dispense Refill  . acetaminophen (TYLENOL) 500 MG tablet Take 500 mg by mouth every 8 (eight) hours as needed.     Marland Kitchen albuterol (PROVENTIL HFA;VENTOLIN HFA) 108 (90 Base)  MCG/ACT inhaler Inhale 2 puffs into the lungs every 6 (six) hours as needed for wheezing or shortness of breath. 1 Inhaler 0  . Ascorbic Acid 1000 MG TBCR Take 1 tablet by mouth daily.     Marland Kitchen aspirin 81 MG tablet Take 81 mg by mouth daily.     . budesonide-formoterol (SYMBICORT) 160-4.5 MCG/ACT inhaler     . calcium carbonate (OS-CAL) 600 MG TABS tablet Take 600 mg by mouth daily with breakfast.     . FLUZONE HIGH-DOSE 0.5 ML injection     . hydrocortisone (ANUSOL-HC) 2.5 % rectal cream Place 1 application rectally 2 (two) times daily. 30 g 0  . metoprolol tartrate (LOPRESSOR) 25 MG tablet TAKE 1 TABLET BY MOUTH TWICE A DAY 180 tablet 1  . MULTIPLE VITAMIN PO Take 1 tablet by mouth daily.     . nortriptyline (PAMELOR) 25 MG capsule Take 1 capsule (25 mg total) by mouth every evening. 90 capsule 3  . pantoprazole (PROTONIX) 40 MG  tablet Take 1 tablet (40 mg total) by mouth 2 (two) times daily. 180 tablet 3  . simvastatin (ZOCOR) 40 MG tablet TAKE 1 TABLET BY MOUTH AT BEDTIME 90 tablet 3  . tobramycin (TOBREX) 0.3 % ophthalmic solution     . traMADol (ULTRAM) 50 MG tablet TAKE 1 TO 2 TABLETS BY MOUTH EVERY 6 HOURS AS NEEDED (Patient not taking: Reported on 12/10/2016) 60 tablet 5   No current facility-administered medications on file prior to visit.    There are no Patient Instructions on file for this visit. No Follow-up on file.  Lilyian Quayle A Reannah Totten, PA-C

## 2016-12-13 DIAGNOSIS — G4733 Obstructive sleep apnea (adult) (pediatric): Secondary | ICD-10-CM | POA: Diagnosis not present

## 2016-12-13 DIAGNOSIS — R0602 Shortness of breath: Secondary | ICD-10-CM | POA: Diagnosis not present

## 2016-12-13 DIAGNOSIS — R0683 Snoring: Secondary | ICD-10-CM | POA: Diagnosis not present

## 2016-12-13 DIAGNOSIS — J449 Chronic obstructive pulmonary disease, unspecified: Secondary | ICD-10-CM | POA: Diagnosis not present

## 2016-12-25 DIAGNOSIS — H353221 Exudative age-related macular degeneration, left eye, with active choroidal neovascularization: Secondary | ICD-10-CM | POA: Diagnosis not present

## 2016-12-25 DIAGNOSIS — H35432 Paving stone degeneration of retina, left eye: Secondary | ICD-10-CM | POA: Diagnosis not present

## 2016-12-25 DIAGNOSIS — H353213 Exudative age-related macular degeneration, right eye, with inactive scar: Secondary | ICD-10-CM | POA: Diagnosis not present

## 2016-12-25 DIAGNOSIS — H43813 Vitreous degeneration, bilateral: Secondary | ICD-10-CM | POA: Diagnosis not present

## 2016-12-26 ENCOUNTER — Other Ambulatory Visit: Payer: Self-pay | Admitting: Physician Assistant

## 2016-12-26 DIAGNOSIS — K219 Gastro-esophageal reflux disease without esophagitis: Secondary | ICD-10-CM

## 2016-12-26 MED ORDER — PANTOPRAZOLE SODIUM 40 MG PO TBEC: 40.0000 mg | DELAYED_RELEASE_TABLET | Freq: Two times a day (BID) | ORAL | 3 refills | Status: DC

## 2016-12-26 NOTE — Telephone Encounter (Signed)
[facility] faxed a request for 90-days supply for  the following medication. Thanks CC  pantoprazole (PROTONIX) 40 MG tablet

## 2017-01-16 ENCOUNTER — Encounter: Payer: Self-pay | Admitting: Physician Assistant

## 2017-01-18 ENCOUNTER — Telehealth: Payer: Self-pay | Admitting: Physician Assistant

## 2017-01-18 DIAGNOSIS — I10 Essential (primary) hypertension: Secondary | ICD-10-CM

## 2017-01-18 MED ORDER — METOPROLOL TARTRATE 25 MG PO TABS: 25.0000 mg | ORAL_TABLET | Freq: Two times a day (BID) | ORAL | 1 refills | Status: DC

## 2017-01-18 NOTE — Telephone Encounter (Signed)
[facility]ed a refill request on the following medications:  metoprolol tartrate (LOPRESSOR) 25 MG tablet  90 day supply.  [facility]

## 2017-01-18 NOTE — Telephone Encounter (Signed)
refilled 

## 2017-01-21 DIAGNOSIS — Z1231 Encounter for screening mammogram for malignant neoplasm of breast: Secondary | ICD-10-CM | POA: Diagnosis not present

## 2017-01-21 LAB — HM MAMMOGRAPHY

## 2017-01-24 ENCOUNTER — Ambulatory Visit (INDEPENDENT_AMBULATORY_CARE_PROVIDER_SITE_OTHER): Payer: Medicare Other

## 2017-01-24 VITALS — BP 148/70 | HR 80 | Temp 97.7°F | Ht 66.0 in | Wt 178.4 lb

## 2017-01-24 DIAGNOSIS — Z0000 Encounter for general adult medical examination without abnormal findings: Secondary | ICD-10-CM | POA: Diagnosis not present

## 2017-01-24 NOTE — Patient Instructions (Signed)
[name] , Thank you for taking time to come for your Medicare Wellness Visit. I appreciate your ongoing commitment to your health goals. Please review the following plan we discussed and let me know if I can assist you in the future.   Screening recommendations/referrals: Colonoscopy: Up to date Mammogram: Up to date Bone Density: Up to date Recommended yearly ophthalmology/optometry visit for glaucoma screening and checkup Recommended yearly dental visit for hygiene and checkup  Vaccinations: Influenza vaccine: Up to date Pneumococcal vaccine: Up to date Tdap vaccine: Pt declines today.  Shingles vaccine: Pt declines today.     Advanced directives: Please bring a copy of your POA (Power of Attorney) and/or Living Will to your next appointment.   Conditions/risks identified: Recommend increasing water intake to 6-8 glasses of water a day.   Next appointment: 02/01/17 @ 2:00 PM   Preventive Care 79 Years and Older, Female Preventive care refers to lifestyle choices and visits with your health care provider that can promote health and wellness. What does preventive care include?  A yearly physical exam. This is also called an annual well check.  Dental exams once or twice a year.  Routine eye exams. Ask your health care provider how often you should have your eyes checked.  Personal lifestyle choices, including:  Daily care of your teeth and gums.  Regular physical activity.  Eating a healthy diet.  Avoiding tobacco and drug use.  Limiting alcohol use.  Practicing safe sex.  Taking low-dose aspirin every day.  Taking vitamin and mineral supplements as recommended by your health care provider. What happens during an annual well check? The services and screenings done by your health care provider during your annual well check will depend on your age, overall health, lifestyle risk factors, and family history of disease. Counseling  Your health care provider may  ask you questions about your:  Alcohol use.  Tobacco use.  Drug use.  Emotional well-being.  Home and relationship well-being.  Sexual activity.  Eating habits.  History of falls.  Memory and ability to understand (cognition).  Work and work Statistician.  Reproductive health. Screening  You may have the following tests or measurements:  Height, weight, and BMI.  Blood pressure.  Lipid and cholesterol levels. These may be checked every 5 years, or more frequently if you are over 81 years old.  Skin check.  Lung cancer screening. You may have this screening every year starting at age 48 if you have a 30-pack-year history of smoking and currently smoke or have quit within the past 15 years.  Fecal occult blood test (FOBT) of the stool. You may have this test every year starting at age 85.  Flexible sigmoidoscopy or colonoscopy. You may have a sigmoidoscopy every 5 years or a colonoscopy every 10 years starting at age 44.  Hepatitis C blood test.  Hepatitis B blood test.  Sexually transmitted disease (STD) testing.  Diabetes screening. This is done by checking your blood sugar (glucose) after you have not eaten for a while (fasting). You may have this done every 1-3 years.  Bone density scan. This is done to screen for osteoporosis. You may have this done starting at age 21.  Mammogram. This may be done every 1-2 years. Talk to your health care provider about how often you should have regular mammograms. Talk with your health care provider about your test results, treatment options, and if necessary, the need for more tests. Vaccines  Your health care provider may recommend  certain vaccines, such as:  Influenza vaccine. This is recommended every year.  Tetanus, diphtheria, and acellular pertussis (Tdap, Td) vaccine. You may need a Td booster every 10 years.  Zoster vaccine. You may need this after age 44.  Pneumococcal 13-valent conjugate (PCV13) vaccine. One  dose is recommended after age 61.  Pneumococcal polysaccharide (PPSV23) vaccine. One dose is recommended after age 66. Talk to your health care provider about which screenings and vaccines you need and how often you need them. This information is not intended to replace advice given to you by your health care provider. Make sure you discuss any questions you have with your health care provider. Document Released: 02/18/2015 Document Revised: 10/12/2015 Document Reviewed: 11/23/2014 Elsevier Interactive Patient Education  2017 [place] Prevention in the Home Falls can cause injuries. They can happen to people of all ages. There are many things you can do to make your home safe and to help prevent falls. What can I do on the outside of my home?  Regularly fix the edges of walkways and driveways and fix any cracks.  Remove anything that might make you trip as you walk through a door, such as a raised step or threshold.  Trim any bushes or trees on the path to your home.  Use bright outdoor lighting.  Clear any walking paths of anything that might make someone trip, such as rocks or tools.  Regularly check to see if handrails are loose or broken. Make sure that both sides of any steps have handrails.  Any raised decks and porches should have guardrails on the edges.  Have any leaves, snow, or ice cleared regularly.  Use sand or salt on walking paths during winter.  Clean up any spills in your garage right away. This includes oil or grease spills. What can I do in the bathroom?  Use night lights.  Install grab bars by the toilet and in the tub and shower. Do not use towel bars as grab bars.  Use non-skid mats or decals in the tub or shower.  If you need to sit down in the shower, use a plastic, non-slip stool.  Keep the floor dry. Clean up any water that spills on the floor as soon as it happens.  Remove soap buildup in the tub or shower regularly.  Attach bath  mats securely with double-sided non-slip rug tape.  Do not have throw rugs and other things on the floor that can make you trip. What can I do in the bedroom?  Use night lights.  Make sure that you have a light by your bed that is easy to reach.  Do not use any sheets or blankets that are too big for your bed. They should not hang down onto the floor.  Have a firm chair that has side arms. You can use this for support while you get dressed.  Do not have throw rugs and other things on the floor that can make you trip. What can I do in the kitchen?  Clean up any spills right away.  Avoid walking on wet floors.  Keep items that you use a lot in easy-to-reach places.  If you need to reach something above you, use a strong step stool that has a grab bar.  Keep electrical cords out of the way.  Do not use floor polish or wax that makes floors slippery. If you must use wax, use non-skid floor wax.  Do not have throw rugs and other  things on the floor that can make you trip. What can I do with my stairs?  Do not leave any items on the stairs.  Make sure that there are handrails on both sides of the stairs and use them. Fix handrails that are broken or loose. Make sure that handrails are as long as the stairways.  Check any carpeting to make sure that it is firmly attached to the stairs. Fix any carpet that is loose or worn.  Avoid having throw rugs at the top or bottom of the stairs. If you do have throw rugs, attach them to the floor with carpet tape.  Make sure that you have a light switch at the top of the stairs and the bottom of the stairs. If you do not have them, ask someone to add them for you. What else can I do to help prevent falls?  Wear shoes that:  Do not have high heels.  Have rubber bottoms.  Are comfortable and fit you well.  Are closed at the toe. Do not wear sandals.  If you use a stepladder:  Make sure that it is fully opened. Do not climb a closed  stepladder.  Make sure that both sides of the stepladder are locked into place.  Ask someone to hold it for you, if possible.  Clearly mark and make sure that you can see:  Any grab bars or handrails.  First and last steps.  Where the edge of each step is.  Use tools that help you move around (mobility aids) if they are needed. These include:  Canes.  Walkers.  Scooters.  Crutches.  Turn on the lights when you go into a dark area. Replace any light bulbs as soon as they burn out.  Set up your furniture so you have a clear path. Avoid moving your furniture around.  If any of your floors are uneven, fix them.  If there are any pets around you, be aware of where they are.  Review your medicines with your doctor. Some medicines can make you feel dizzy. This can increase your chance of falling. Ask your doctor what other things that you can do to help prevent falls. This information is not intended to replace advice given to you by your health care provider. Make sure you discuss any questions you have with your health care provider. Document Released: 11/18/2008 Document Revised: 06/30/2015 Document Reviewed: 02/26/2014 Elsevier Interactive Patient Education  2017 [employer].

## 2017-01-24 NOTE — Progress Notes (Signed)
Subjective:   Christina [name] is a 81 y.o. female who presents for Medicare Annual (Subsequent) preventive examination.  Review of Systems:  N/A  Cardiac Risk Factors include: advanced age (>50men, >24 women);dyslipidemia;hypertension     Objective:     Vitals: BP (!) 148/70 (BP Location: Left Arm)   Pulse 80   Temp 97.7 F (36.5 C) (Oral)   Ht 5\' 6"  (1.676 m)   Wt 178 lb 6.4 oz (80.9 kg)   BMI 28.79 kg/m   Body mass index is 28.79 kg/m.  Advanced Directives 01/24/2017 10/26/2016 01/27/2016 02/11/2015 01/25/2015  Does Patient Have a Medical Advance Directive? Yes Yes Yes Yes Yes  Type of Paramedic of [place];Living will [place];Living will Living will;Healthcare Power of [place];Living will [place];Living will  Copy of [place] in Chart? No - copy requested - - - -    Tobacco Social History   Tobacco Use  Smoking Status Former Smoker  . Packs/day: 0.50  . Years: 5.00  . Pack years: 2.50  . Last attempt to quit: 04/24/2004  . Years since quitting: 12.7  Smokeless Tobacco Never Used     Counseling given: Not Answered   Clinical Intake:  Pre-visit preparation completed: Yes  Pain : No/denies pain Pain Score: 0-No pain     Nutritional Status: BMI 25 -29 Overweight Nutritional Risks: None Diabetes: No  How often do you need to have someone help you when you read instructions, pamphlets, or other written materials from your doctor or pharmacy?: 1 - Never  Interpreter Needed?: No  Information entered by :: [employer], LPN  Past Medical History:  Diagnosis Date  . Hyperlipidemia   . Hypertension   . PAD (peripheral artery disease) ([place])   . Sleep apnea    Past Surgical History:  Procedure Laterality Date  . ABDOMINAL HYSTERECTOMY  1980   due to dysfunctional uterine bleeding  . APPENDECTOMY  1980  . BREAST SURGERY  2000   biopsy    . CORONARY ARTERY BYPASS GRAFT    . HEMORRHOID SURGERY    . LUNG BIOPSY  1999   Negative   Family History  Problem Relation Age of Onset  . Hypertension Mother   . Hyperlipidemia Mother   . Alzheimer's disease Mother   . CAD Mother   . Heart attack Father   . Lung disease Sister   . Heart disease Sister   . Diabetes Paternal Grandfather        Type 2  . Hearing loss Son    Social History   Socioeconomic History  . Marital status: Married    Spouse name: Cliffton Mathis Bud  . Number of children: 2  . Years of education: 60  . Highest education level: 12th grade  Social Needs  . Financial resource strain: Not hard at all  . Food insecurity - worry: Never true  . Food insecurity - inability: Never true  . Transportation needs - medical: No  . Transportation needs - non-medical: No  Occupational History  . Occupation: retired  Tobacco Use  . Smoking status: Former Smoker    Packs/day: 0.50    Years: 5.00    Pack years: 2.50    Last attempt to quit: 04/24/2004    Years since quitting: 12.7  . Smokeless tobacco: Never Used  Substance and Sexual Activity  . Alcohol use: No  . Drug use: No  . Sexual activity:  Yes  Other Topics Concern  . None  Social History Narrative  . None    Outpatient Encounter Medications as of 01/24/2017  Medication Sig  . acetaminophen (TYLENOL) 500 MG tablet Take 500 mg by mouth every 8 (eight) hours as needed.   Marland Kitchen albuterol (PROVENTIL HFA;VENTOLIN HFA) 108 (90 Base) MCG/ACT inhaler Inhale 2 puffs into the lungs every 6 (six) hours as needed for wheezing or shortness of breath.  . Ascorbic Acid 1000 MG TBCR Take 1 tablet by mouth daily.   Marland Kitchen aspirin 81 MG tablet Take 81 mg by mouth daily.   . budesonide-formoterol (SYMBICORT) 160-4.5 MCG/ACT inhaler Inhale 2 puffs into the lungs 2 (two) times daily.   . calcium carbonate (OS-CAL) 600 MG TABS tablet Take 600 mg by mouth daily with breakfast.   . FLUZONE HIGH-DOSE 0.5 ML injection   .  hydrocortisone (ANUSOL-HC) 2.5 % rectal cream Place 1 application rectally 2 (two) times daily. (Patient taking differently: Place 1 application rectally 2 (two) times daily. )  . metoprolol tartrate (LOPRESSOR) 25 MG tablet Take 1 tablet (25 mg total) by mouth 2 (two) times daily.  . MULTIPLE VITAMIN PO Take 1 tablet by mouth daily.   . simvastatin (ZOCOR) 40 MG tablet TAKE 1 TABLET BY MOUTH AT BEDTIME  . tobramycin (TOBREX) 0.3 % ophthalmic solution Place 1 drop into the left eye every 6 (six) hours.   . nortriptyline (PAMELOR) 25 MG capsule Take 1 capsule (25 mg total) by mouth every evening. (Patient not taking: Reported on 01/24/2017)  . pantoprazole (PROTONIX) 40 MG tablet Take 1 tablet (40 mg total) by mouth 2 (two) times daily. (Patient not taking: Reported on 01/24/2017)  . traMADol (ULTRAM) 50 MG tablet TAKE 1 TO 2 TABLETS BY MOUTH EVERY 6 HOURS AS NEEDED (Patient not taking: Reported on 12/10/2016)   No facility-administered encounter medications on file as of 01/24/2017.     Activities of Daily Living In your present state of health, do you have any difficulty performing the following activities: 01/24/2017 01/27/2016  Hearing? N N  Vision? Y Y  Comment loss of sight in the right eye and MD in left eye -  Difficulty concentrating or making decisions? N Y  Walking or climbing stairs? N Y  Dressing or bathing? N N  Doing errands, shopping? N N  Preparing Food and eating ? N -  Using the Toilet? N -  In the past six months, have you accidently leaked urine? Y -  Comment wears protection -  Do you have problems with loss of bowel control? N -  Managing your Medications? N -  Managing your Finances? N -  Housekeeping or managing your Housekeeping? N -  Some recent data might be hidden    Patient Care Team: Rubye Beach as PCP - General (Family Medicine) [name] as Consulting Physician (Pulmonary Disease) [name], MD as Consulting Physician  (Ophthalmology)    Assessment:   This is a routine wellness examination for Christina [name].  Exercise Activities and Dietary recommendations Current Exercise Habits: Home exercise routine, Type of exercise: stretching, Time (Minutes): 10, Frequency (Times/Week): 3, Weekly Exercise (Minutes/Week): 30, Intensity: Mild, Exercise limited by: None identified  Goals    . DIET - INCREASE WATER INTAKE     Recommend increasing water intake to 6-8 glasses of water a day.     . Exercise 150 minutes per week (moderate activity)       Fall Risk Fall Risk  01/24/2017 01/27/2016  01/25/2015  Falls in the past year? No No No   Is the patient's home free of loose throw rugs in walkways, pet beds, electrical cords, etc?yes      Grab bars in the bathroom? no, pt declined referral to have these placed in home.      Handrails on the stairs?  N/A      Adequate lighting? yes  Timed Get Up and Go performed: N/A  Depression Screen PHQ 2/9 Scores 01/24/2017 01/27/2016 01/25/2015  PHQ - 2 Score 2 0 0  PHQ- 9 Score 4 - -     Cognitive Function: Pt declined screening today.        Immunization History  Administered Date(s) Administered  . Influenza Split 12/01/2005, 11/17/2007  . Influenza-Unspecified 10/06/2013, 10/07/2015, 10/25/2016  . Pneumococcal Conjugate-13 12/28/2013  . Pneumococcal Polysaccharide-23 05/31/2003  . Zoster 03/17/2008    Qualifies for Shingles Vaccine? Due for Shingles vaccine. Declined my offer to administer today. Education has been provided regarding the importance of this vaccine. Pt has been advised to call her insurance company to determine her out of pocket expense. Advised she may also receive this vaccine at her local pharmacy or Health Dept. Verbalized acceptance and understanding.  Screening Tests Health Maintenance  Topic Date Due  . TETANUS/TDAP  10/15/1956  . INFLUENZA VACCINE  Completed  . DEXA SCAN  Completed  . PNA vac Low Risk Adult  Completed    Cancer  Screenings: Lung: Low Dose CT Chest recommended if Age 3-80 years, 30 pack-year currently smoking OR have quit w/in 15years. Patient does not qualify. Breast:  Up to date on Mammogram? Yes   Up to date of Bone Density/Dexa? Yes Colorectal: Up to date  Additional Screenings:  Hepatitis B/HIV/Syphillis: Pt declines today.  Hepatitis C Screening: Pt declines today.        Plan:  I have personally reviewed and addressed the Medicare Annual Wellness questionnaire and have noted the following in the patient's chart:  A. Medical and social history B. Use of alcohol, tobacco or illicit drugs  C. Current medications and supplements D. Functional ability and status E.  Nutritional status F.  Physical activity G. Advance directives H. List of other physicians I.  Hospitalizations, surgeries, and ER visits in previous 12 months J.  [place] such as hearing and vision if needed, cognitive and depression L. Referrals and appointments - none  In addition, I have reviewed and discussed with patient certain preventive protocols, quality metrics, and best practice recommendations. A written personalized care plan for preventive services as well as general preventive health recommendations were provided to patient.  See attached scanned questionnaire for additional information.   Signed,  Fabio Neighbors, LPN Nurse Health Advisor   Nurse Recommendations: Pt declined the tetanus vaccine today. Pt to check with insurance about Shingrix cost and will f/u at next OV with PCP about it.

## 2017-02-01 ENCOUNTER — Encounter: Payer: Self-pay | Admitting: Physician Assistant

## 2017-02-01 ENCOUNTER — Ambulatory Visit (INDEPENDENT_AMBULATORY_CARE_PROVIDER_SITE_OTHER): Payer: Medicare Other | Admitting: Physician Assistant

## 2017-02-01 VITALS — BP 144/70 | HR 80 | Temp 98.1°F | Resp 20 | Wt 174.0 lb

## 2017-02-01 DIAGNOSIS — N183 Chronic kidney disease, stage 3 unspecified: Secondary | ICD-10-CM

## 2017-02-01 DIAGNOSIS — Z0000 Encounter for general adult medical examination without abnormal findings: Secondary | ICD-10-CM

## 2017-02-01 DIAGNOSIS — G4733 Obstructive sleep apnea (adult) (pediatric): Secondary | ICD-10-CM

## 2017-02-01 DIAGNOSIS — I739 Peripheral vascular disease, unspecified: Secondary | ICD-10-CM | POA: Diagnosis not present

## 2017-02-01 DIAGNOSIS — Z9989 Dependence on other enabling machines and devices: Secondary | ICD-10-CM

## 2017-02-01 DIAGNOSIS — Z0001 Encounter for general adult medical examination with abnormal findings: Secondary | ICD-10-CM

## 2017-02-01 DIAGNOSIS — R739 Hyperglycemia, unspecified: Secondary | ICD-10-CM

## 2017-02-01 DIAGNOSIS — I10 Essential (primary) hypertension: Secondary | ICD-10-CM

## 2017-02-01 DIAGNOSIS — E7800 Pure hypercholesterolemia, unspecified: Secondary | ICD-10-CM

## 2017-02-01 MED ORDER — SIMVASTATIN 40 MG PO TABS: 40.0000 mg | ORAL_TABLET | Freq: Every day | ORAL | 3 refills | Status: DC

## 2017-02-01 NOTE — Patient Instructions (Signed)

## 2017-02-01 NOTE — Progress Notes (Signed)
Patient: Christina [name], Female    DOB: [birth year], 81 y.o.   MRN: [id number] Visit Date: 02/01/2017  Today's Provider: Mar Daring, PA-C   Chief Complaint  Patient presents with  . Annual Wellness Exam   Subjective:    Annual wellness visit Christina [name] is a 80 y.o. female. She feels fairly well. She reports exercising no regluarly. She reports she is sleeping well.  Last colonoscopy- 07/20/2013- Tubular adenoma, diverticulosis, internal hemorrhoids. No repeat. Dr. Vira Agar Last BMD- 01/12/2013- osteopenia Last mammogram- 01/21/2017- BI-RADS 1 S/P Hysterectomy 1980   Review of Systems  Constitutional: Negative.   HENT: Positive for congestion, postnasal drip, rhinorrhea and sneezing.   Eyes: Negative.   Respiratory: Positive for cough, shortness of breath and wheezing.        Has COPD. No changes from baseline.   Cardiovascular: Positive for leg swelling.  Gastrointestinal: Negative.   Endocrine: Negative.   Genitourinary: Negative.   Musculoskeletal: Positive for arthralgias.  Skin: Negative.   Allergic/Immunologic: Negative.   Neurological: Negative.   Hematological: Negative.   Psychiatric/Behavioral: Negative.     Social History   Socioeconomic History  . Marital status: Married    Spouse name: Cliffton Mathis Bud  . Number of children: 2  . Years of education: 49  . Highest education level: 12th grade  Social Needs  . Financial resource strain: Not hard at all  . Food insecurity - worry: Never true  . Food insecurity - inability: Never true  . Transportation needs - medical: No  . Transportation needs - non-medical: No  Occupational History  . Occupation: retired  Tobacco Use  . Smoking status: Former Smoker    Packs/day: 0.50    Years: 5.00    Pack years: 2.50    Last attempt to quit: 04/24/2004    Years since quitting: 12.7  . Smokeless tobacco: Never Used  Substance and Sexual Activity  . Alcohol use: No  . Drug use:  No  . Sexual activity: Yes  Other Topics Concern  . Not on file  Social History Narrative  . Not on file    Past Medical History:  Diagnosis Date  . Hyperlipidemia   . Hypertension   . PAD (peripheral artery disease) ([place])   . Sleep apnea      Patient Active Problem List   Diagnosis Date Noted  . Aortic atherosclerosis ([place]) 10/26/2016  . Claudication ([place]) 10/26/2016  . COPD (chronic obstructive pulmonary disease) ([place]) 10/22/2016  . OSA on CPAP 09/16/2015  . Abnormal chest x-ray 01/25/2015  . Atrophic vaginitis 01/25/2015  . Atherosclerosis of coronary artery 01/25/2015  . Chronic kidney disease (CKD), stage III (moderate) ([place]) 01/25/2015  . Does not exercise 01/25/2015  . Bloodgood disease 01/25/2015  . Blood glucose elevated 01/25/2015  . Hyperlipidemia 01/25/2015  . Cannot sleep 01/25/2015  . Hypertension 11/02/2014  . Acid reflux 11/02/2014  . Arthritis 10/19/2014  . Adenomatous colon polyp 07/27/2013  . Gastric catarrh 07/27/2013  . Bergmann's syndrome 07/27/2013  . Barrett esophagus 07/07/2013  . Osteopenia 12/13/2009  . PAD (peripheral artery disease) ([place]) 09/24/2007  . DD (diverticular disease) 12/19/2005    Past Surgical History:  Procedure Laterality Date  . ABDOMINAL HYSTERECTOMY  1980   due to dysfunctional uterine bleeding  . APPENDECTOMY  1980  . BREAST SURGERY  2000   biopsy  . CORONARY ARTERY BYPASS GRAFT    . HEMORRHOID SURGERY    . [place]  Negative    Her family history includes Alzheimer's disease in her mother; CAD in her mother; Diabetes in her paternal grandfather; Hearing loss in her son; Heart attack in her father; Heart disease in her sister; Hyperlipidemia in her mother; Hypertension in her mother; Lung disease in her sister.      Current Outpatient Medications:  .  acetaminophen (TYLENOL) 500 MG tablet, Take 500 mg by mouth every 8 (eight) hours as needed. , Disp: , Rfl:  .  albuterol (PROVENTIL HFA;VENTOLIN HFA)  108 (90 Base) MCG/ACT inhaler, Inhale 2 puffs into the lungs every 6 (six) hours as needed for wheezing or shortness of breath., Disp: 1 Inhaler, Rfl: 0 .  Ascorbic Acid 1000 MG TBCR, Take 1 tablet by mouth daily. , Disp: , Rfl:  .  aspirin 81 MG tablet, Take 81 mg by mouth daily. , Disp: , Rfl:  .  budesonide-formoterol (SYMBICORT) 160-4.5 MCG/ACT inhaler, Inhale 2 puffs into the lungs 2 (two) times daily. , Disp: , Rfl:  .  calcium carbonate (OS-CAL) 600 MG TABS tablet, Take 600 mg by mouth daily with breakfast. , Disp: , Rfl:  .  metoprolol tartrate (LOPRESSOR) 25 MG tablet, Take 1 tablet (25 mg total) by mouth 2 (two) times daily., Disp: 180 tablet, Rfl: 1 .  MULTIPLE VITAMIN PO, Take 1 tablet by mouth daily. , Disp: , Rfl:  .  simvastatin (ZOCOR) 40 MG tablet, TAKE 1 TABLET BY MOUTH AT BEDTIME, Disp: 90 tablet, Rfl: 3 .  FLUZONE HIGH-DOSE 0.5 ML injection, , Disp: , Rfl:  .  hydrocortisone (ANUSOL-HC) 2.5 % rectal cream, Place 1 application rectally 2 (two) times daily. (Patient not taking: Reported on 02/01/2017), Disp: 30 g, Rfl: 0 .  nortriptyline (PAMELOR) 25 MG capsule, Take 1 capsule (25 mg total) by mouth every evening. (Patient not taking: Reported on 01/24/2017), Disp: 90 capsule, Rfl: 3 .  pantoprazole (PROTONIX) 40 MG tablet, Take 1 tablet (40 mg total) by mouth 2 (two) times daily. (Patient not taking: Reported on 01/24/2017), Disp: 180 tablet, Rfl: 3 .  tobramycin (TOBREX) 0.3 % ophthalmic solution, Place 1 drop into the left eye every 6 (six) hours. , Disp: , Rfl:  .  traMADol (ULTRAM) 50 MG tablet, TAKE 1 TO 2 TABLETS BY MOUTH EVERY 6 HOURS AS NEEDED (Patient not taking: Reported on 12/10/2016), Disp: 60 tablet, Rfl: 5  Patient Care Team: Mar Daring, PA-C as PCP - General (Family Medicine) [name] as Consulting Physician (Pulmonary Disease) [name], MD as Consulting Physician (Ophthalmology)     Objective:   Vitals: BP (!) 144/70 (BP Location: Right Arm,  Patient Position: Sitting, Cuff Size: Normal)   Pulse 80   Temp 98.1 F (36.7 C)   Resp 20   Wt 174 lb (78.9 kg)   BMI 28.08 kg/m   Physical Exam  Activities of Daily Living In your present state of health, do you have any difficulty performing the following activities: 01/24/2017  Hearing? N  Vision? Y  Comment loss of sight in the right eye and MD in left eye  Difficulty concentrating or making decisions? N  Walking or climbing stairs? N  Dressing or bathing? N  Doing errands, shopping? N  Preparing Food and eating ? N  Using the Toilet? N  In the past six months, have you accidently leaked urine? Y  Comment wears protection  Do you have problems with loss of bowel control? N  Managing your Medications? N  Managing your Finances?  N  Housekeeping or managing your Housekeeping? N  Some recent data might be hidden    Fall Risk Assessment Fall Risk  01/24/2017 01/27/2016 01/25/2015  Falls in the past year? No No No     Depression Screen PHQ 2/9 Scores 01/24/2017 01/27/2016 01/25/2015  PHQ - 2 Score 2 0 0  PHQ- 9 Score 4 - -     Cognitive Testing - 6-CIT  Correct? Score   What year is it? yes 0 0 or 4  What month is it? yes 0 0 or 3  Memorize:    Pia Mau,  44,  [address]  [place],      What time is it? (within 1 hour) yes 0 0 or 3  Count backwards from 20 yes 0 0, 2, or 4  Name the months of the year yes 0 0, 2, or 4  Repeat name & address above yes 0 0, 2, 4, 6, 8, or 10       TOTAL SCORE  0/28   Interpretation:  Normal  Normal (0-7) Abnormal (8-28)     Assessment & Plan:     Annual Wellness Visit  Reviewed patient's Family Medical History Reviewed and updated list of patient's medical providers Assessment of cognitive impairment was done Assessed patient's functional ability Established a written schedule for health screening [place] Completed and Reviewed  Exercise Activities and Dietary recommendations Goals    .  DIET - INCREASE WATER INTAKE     Recommend increasing water intake to 6-8 glasses of water a day.     . Exercise 150 minutes per week (moderate activity)       Immunization History  Administered Date(s) Administered  . Influenza Split 12/01/2005, 11/17/2007  . Influenza-Unspecified 10/06/2013, 10/07/2015, 10/25/2016  . Pneumococcal Conjugate-13 12/28/2013  . Pneumococcal Polysaccharide-23 05/31/2003  . Zoster 03/17/2008    Health Maintenance  Topic Date Due  . TETANUS/TDAP  10/15/1956  . INFLUENZA VACCINE  Completed  . DEXA SCAN  Completed  . PNA vac Low Risk Adult  Completed     Discussed health benefits of physical activity, and encouraged her to engage in regular exercise appropriate for her age and condition.   1. Annual physical exam Normal physical exam today. Will check labs as below and f/u pending lab results. If labs are stable and WNL she will not need to have these rechecked for one year at her next annual physical exam. She is to call the office in the meantime if she has any acute issue, questions or concerns.  2. Essential hypertension Stable on metoprolol 25mg  BID. Will check labs as below and f/u pending results. - CBC w/Diff/Platelet - COMPLETE METABOLIC PANEL WITH GFR - Lipid Profile - HgB A1c  3. PAD (peripheral artery disease) ([place]) Has previously been seen by Dr. Lucky Cowboy and advised she may need angiogram with possible intervention in the R CIA but patient is unsure if she wants this at this time. She is also considering transferring to the [facility] vascular surgery clinic in [place] instead of having this locally as well. She is going to call the [place] office to see if they will see her and use her [place] she recently had at Dr. Bunnie Domino office.  - CBC w/Diff/Platelet - COMPLETE METABOLIC PANEL WITH GFR - Lipid Profile  4. OSA on CPAP Uses nightly. No issues.   5. Chronic kidney disease (CKD), stage III (moderate) ([facility]) Stable. Will check labs as below  and f/u pending  results. - COMPLETE METABOLIC PANEL WITH GFR  6. Pure hypercholesterolemia Stable on simvastatin 40mg . Will check labs as below and f/u pending results. - COMPLETE METABOLIC PANEL WITH GFR - Lipid Profile - HgB A1c - simvastatin (ZOCOR) 40 MG tablet; Take 1 tablet (40 mg total) by mouth at bedtime.  Dispense: 90 tablet; Refill: 3  7. Claudication ([place]) No changes. See above medical treatment plan for #3.   8. Blood glucose elevated Will check labs as below and f/u pending results. - HgB A1c      Mar Daring, PA-C  [facility]

## 2017-02-07 ENCOUNTER — Other Ambulatory Visit: Payer: Self-pay | Admitting: Physician Assistant

## 2017-02-07 DIAGNOSIS — I739 Peripheral vascular disease, unspecified: Secondary | ICD-10-CM | POA: Diagnosis not present

## 2017-02-07 DIAGNOSIS — E7800 Pure hypercholesterolemia, unspecified: Secondary | ICD-10-CM | POA: Diagnosis not present

## 2017-02-07 DIAGNOSIS — N183 Chronic kidney disease, stage 3 (moderate): Secondary | ICD-10-CM | POA: Diagnosis not present

## 2017-02-07 DIAGNOSIS — R739 Hyperglycemia, unspecified: Secondary | ICD-10-CM | POA: Diagnosis not present

## 2017-02-07 DIAGNOSIS — I10 Essential (primary) hypertension: Secondary | ICD-10-CM | POA: Diagnosis not present

## 2017-02-08 LAB — COMP. METABOLIC PANEL (12)
AST: 22 IU/L (ref 0–40)
Albumin/Globulin Ratio: 1.7 (ref 1.2–2.2)
Albumin: 4 g/dL (ref 3.5–4.8)
Alkaline Phosphatase: 69 IU/L (ref 39–117)
BILIRUBIN TOTAL: 0.4 mg/dL (ref 0.0–1.2)
BUN / CREAT RATIO: 19 (ref 12–28)
BUN: 20 mg/dL (ref 8–27)
CREATININE: 1.07 mg/dL — AB (ref 0.57–1.00)
Calcium: 9 mg/dL (ref 8.7–10.3)
Chloride: 108 mmol/L — ABNORMAL HIGH (ref 96–106)
GFR calc Af Amer: 57 mL/min/{1.73_m2} — ABNORMAL LOW (ref >59)
GFR, EST NON AFRICAN AMERICAN: 49 mL/min/{1.73_m2} — AB (ref >59)
GLUCOSE: 97 mg/dL (ref 65–99)
Globulin, Total: 2.3 g/dL (ref 1.5–4.5)
Potassium: 4.5 mmol/L (ref 3.5–5.2)
Sodium: 145 mmol/L — ABNORMAL HIGH (ref 134–144)
TOTAL PROTEIN: 6.3 g/dL (ref 6.0–8.5)

## 2017-02-08 LAB — HGB A1C W/O EAG: Hgb A1c MFr Bld: 5.9 % — ABNORMAL HIGH (ref 4.8–5.6)

## 2017-02-08 LAB — CBC WITH DIFFERENTIAL/PLATELET
BASOS ABS: 0 10*3/uL (ref 0.0–0.2)
Basos: 0 %
EOS (ABSOLUTE): 0.1 10*3/uL (ref 0.0–0.4)
Eos: 2 %
Hematocrit: 38.9 % (ref 34.0–46.6)
Hemoglobin: 12.7 g/dL (ref 11.1–15.9)
IMMATURE GRANS (ABS): 0 10*3/uL (ref 0.0–0.1)
Immature Granulocytes: 0 %
LYMPHS: 24 %
Lymphocytes Absolute: 1.6 10*3/uL (ref 0.7–3.1)
MCH: 34 pg — ABNORMAL HIGH (ref 26.6–33.0)
MCHC: 32.6 g/dL (ref 31.5–35.7)
MCV: 104 fL — AB (ref 79–97)
MONOCYTES: 8 %
Monocytes Absolute: 0.5 10*3/uL (ref 0.1–0.9)
NEUTROS ABS: 4.4 10*3/uL (ref 1.4–7.0)
NEUTROS PCT: 66 %
PLATELETS: 208 10*3/uL (ref 150–379)
RBC: 3.73 x10E6/uL — AB (ref 3.77–5.28)
RDW: 12.9 % (ref 12.3–15.4)
WBC: 6.7 10*3/uL (ref 3.4–10.8)

## 2017-02-08 LAB — LIPID PANEL
CHOL/HDL RATIO: 2.9 ratio (ref 0.0–4.4)
CHOLESTEROL TOTAL: 149 mg/dL (ref 100–199)
HDL: 52 mg/dL (ref >39)
LDL CALC: 66 mg/dL (ref 0–99)
TRIGLYCERIDES: 153 mg/dL — AB (ref 0–149)
VLDL CHOLESTEROL CAL: 31 mg/dL (ref 5–40)

## 2017-02-11 ENCOUNTER — Telehealth: Payer: Self-pay

## 2017-02-11 NOTE — Telephone Encounter (Signed)
-----   Message from Mar Daring, PA-C sent at 02/11/2017 10:19 AM EST ----- A1c down to 5.9 from 6.0. Cholesterol slightly improved as well. Continue healthy lifestyle modifications. Renal function improved. Continue to push fluids. All other labs are WNL and stable.

## 2017-02-11 NOTE — Telephone Encounter (Signed)
Patient advised as below. Patient verbalizes understanding and is in agreement with treatment plan.  

## 2017-02-13 DIAGNOSIS — H35432 Paving stone degeneration of retina, left eye: Secondary | ICD-10-CM | POA: Diagnosis not present

## 2017-02-13 DIAGNOSIS — H353213 Exudative age-related macular degeneration, right eye, with inactive scar: Secondary | ICD-10-CM | POA: Diagnosis not present

## 2017-02-13 DIAGNOSIS — H43813 Vitreous degeneration, bilateral: Secondary | ICD-10-CM | POA: Diagnosis not present

## 2017-02-13 DIAGNOSIS — H353221 Exudative age-related macular degeneration, left eye, with active choroidal neovascularization: Secondary | ICD-10-CM | POA: Diagnosis not present

## 2017-02-20 ENCOUNTER — Ambulatory Visit (INDEPENDENT_AMBULATORY_CARE_PROVIDER_SITE_OTHER): Payer: Medicare Other | Admitting: Vascular Surgery

## 2017-02-20 ENCOUNTER — Encounter (INDEPENDENT_AMBULATORY_CARE_PROVIDER_SITE_OTHER): Payer: Self-pay

## 2017-02-20 ENCOUNTER — Encounter (INDEPENDENT_AMBULATORY_CARE_PROVIDER_SITE_OTHER): Payer: Self-pay | Admitting: Vascular Surgery

## 2017-02-20 VITALS — BP 180/81 | HR 76 | Resp 16 | Wt 175.0 lb

## 2017-02-20 DIAGNOSIS — E7800 Pure hypercholesterolemia, unspecified: Secondary | ICD-10-CM

## 2017-02-20 DIAGNOSIS — I739 Peripheral vascular disease, unspecified: Secondary | ICD-10-CM | POA: Diagnosis not present

## 2017-02-20 DIAGNOSIS — R739 Hyperglycemia, unspecified: Secondary | ICD-10-CM

## 2017-02-20 NOTE — Progress Notes (Signed)
Subjective:    Patient ID: Christina [name], female    DOB: 10-04-1935, 82 y.o.   MRN: [id number] Chief Complaint  Patient presents with  . Follow-up    discuss having angiogram   Patient last seen on December 10, 2016 to review vascular studies.  At the time the patient was found to have:  "The patient underwent a bilateral ABI which was notable for right ankle brachial index suggesting moderate right lower extremity arterial occlusive disease.No evidence of significant left lower extremity arterial disease based on the left ankle brachial index. Right biphasic tibials. Left triphasic tibials. The patient underwent an aortoiliac arterial duplex exam which was notable for Doppler velocities suggesting a greater than 50% stenosis of the right proximal common iliac artery. The left common iliac artery was not adequately visualized however the left external iliac artery Doppler waveforms and ABI are suggestive of no hemodynamically significant stenosis. No significant stenosis in the abdominal aorta or bilateral external iliac stents. Right biphasic common iliac artery. Right monophasic external iliac arteries. Left biphasic external iliac arteries"  I recommended the patient undergo a right lower extremity angiogram with possible intervention to assess her anatomy and any contributing peripheral artery disease.  The patient was experiencing right thigh claudication which was lifestyle limiting however the patient refused moving forward with an angiogram and preferred a six-month follow-up.  The patient was seen by Cher Nakai PA-C for her annual physical in December 2018 and had mentioned she was thinking about transitioning her care to the [place] office however she presents today to discuss moving forward with an angiogram.  Husband is present today with her.  I had a long discussion about her results, the procedure, risks and benefits and what to expect post procedure.  The patient is  willing to now proceed.   Review of Systems  Constitutional: Negative.   HENT: Negative.   Eyes: Negative.   Respiratory: Negative.   Cardiovascular:       Right lower extremity claudication  Gastrointestinal: Negative.   Endocrine: Negative.   Genitourinary: Negative.   Musculoskeletal: Negative.   Skin: Negative.   Allergic/Immunologic: Negative.   Neurological: Negative.   Hematological: Negative.   Psychiatric/Behavioral: Negative.       Objective:   Physical Exam  Constitutional: She is oriented to person, place, and time. She appears well-developed and well-nourished. No distress.  HENT:  Head: Normocephalic and atraumatic.  Eyes: Conjunctivae are normal. Pupils are equal, round, and reactive to light.  Neck: Normal range of motion.  Cardiovascular: Normal rate, regular rhythm, normal heart sounds and intact distal pulses.  Pulses:      Radial pulses are 2+ on the right side, and 2+ on the left side.       Dorsalis pedis pulses are 1+ on the right side, and 1+ on the left side.       Posterior tibial pulses are 1+ on the right side, and 1+ on the left side.  Pulmonary/Chest: Effort normal and breath sounds normal.  Musculoskeletal: Normal range of motion. She exhibits no edema.  Neurological: She is alert and oriented to person, place, and time.  Skin: Skin is warm and dry. She is not diaphoretic.  Psychiatric: She has a normal mood and affect. Her behavior is normal. Judgment and thought content normal.  Vitals reviewed.  BP (!) 180/81 (BP Location: Left Arm)   Pulse 76   Resp 16   Wt 175 lb (79.4 kg)   BMI 28.25 kg/m  Past Medical History:  Diagnosis Date  . Hyperlipidemia   . Hypertension   . PAD (peripheral artery disease) ([place])   . Sleep apnea    Social History   Socioeconomic History  . Marital status: Married    Spouse name: Cliffton Mathis Bud  . Number of children: 2  . Years of education: 45  . Highest education level: 12th grade  Social  Needs  . Financial resource strain: Not hard at all  . Food insecurity - worry: Never true  . Food insecurity - inability: Never true  . Transportation needs - medical: No  . Transportation needs - non-medical: No  Occupational History  . Occupation: retired  Tobacco Use  . Smoking status: Former Smoker    Packs/day: 0.50    Years: 5.00    Pack years: 2.50    Last attempt to quit: 04/24/2004    Years since quitting: 12.8  . Smokeless tobacco: Never Used  Substance and Sexual Activity  . Alcohol use: No  . Drug use: No  . Sexual activity: Yes  Other Topics Concern  . Not on file  Social History Narrative  . Not on file   Past Surgical History:  Procedure Laterality Date  . ABDOMINAL HYSTERECTOMY  1980   due to dysfunctional uterine bleeding  . APPENDECTOMY  1980  . BREAST SURGERY  2000   biopsy  . CORONARY ARTERY BYPASS GRAFT    . HEMORRHOID SURGERY    . LUNG BIOPSY  1999   Negative   Family History  Problem Relation Age of Onset  . Hypertension Mother   . Hyperlipidemia Mother   . Alzheimer's disease Mother   . CAD Mother   . Heart attack Father   . Lung disease Sister   . Heart disease Sister   . Diabetes Paternal Grandfather        Type 2  . Hearing loss Son    Allergies  Allergen Reactions  . Baclofen Other (See Comments)  . Codeine   . Sulfa Antibiotics   . Latex Rash      Assessment & Plan:  Patient last seen on December 10, 2016 to review vascular studies. At the time the patient was found to have:  "The patient underwent a bilateral ABI which was notable for right ankle brachial index suggesting moderate right lower extremity arterial occlusive disease.No evidence of significant left lower extremity arterial disease based on the left ankle brachial index. Right biphasic tibials. Left triphasic tibials. The patient underwent an aortoiliac arterial duplex exam which was notable for Doppler velocities suggesting a greater than 50% stenosis of the right  proximal common iliac artery. The left common iliac artery was not adequately visualized however the left external iliac artery Doppler waveforms and ABI are suggestive of no hemodynamically significant stenosis. No significant stenosis in the abdominal aorta or bilateral external iliac stents. Right biphasic common iliac artery. Right monophasic external iliac arteries. Left biphasic external iliac arteries"  I recommended the patient undergo a right lower extremity angiogram with possible intervention to assess her anatomy and any contributing peripheral artery disease.  The patient was experiencing right thigh claudication which was lifestyle limiting however the patient refused moving forward with an angiogram and preferred a six-month follow-up.  The patient was seen by Cher Nakai PA-C for her annual physical in December 2018 and had mentioned she was thinking about transitioning her care to the [place] office however she presents today to discuss moving forward with an angiogram.  Husband is present today with her.  I had a long discussion about her results, the procedure, risks and benefits and what to expect post procedure.  The patient is willing to now proceed.  1) PAD - Stable The patient was last seen in November 2018 and recommended at that time to undergo a right lower extremity angiogram for lifestyle limiting right lower extremity claudication and right lower extremity peripheral artery disease seen on ABI and duplex At the time, the patient refused moving forward with an angiogram of preferred a six-month follow-up The patient presents today with her husband to discuss moving forward with the angiogram I had a long discussion with the patient in regard to her anatomy, peripheral artery disease, procedure, risks, benefits, and postoperative course expectations. At this time the patient is willing to move forward however is still considerably nervous about the procedure. Recommend a  right lower extremity angiogram with possible intervention to assess the patient's anatomy encountered contributing peripheral artery disease.  2) Hyperlipidemia - Stable Encouraged good control as its slows the progression of atherosclerotic disease  3) Elevated Glucose - Stable Encouraged good control as its slows the progression of atherosclerotic disease  Current Outpatient Medications on File Prior to Visit  Medication Sig Dispense Refill  . acetaminophen (TYLENOL) 500 MG tablet Take 500 mg by mouth every 8 (eight) hours as needed.     Marland Kitchen albuterol (PROVENTIL HFA;VENTOLIN HFA) 108 (90 Base) MCG/ACT inhaler Inhale 2 puffs into the lungs every 6 (six) hours as needed for wheezing or shortness of breath. 1 Inhaler 0  . Ascorbic Acid 1000 MG TBCR Take 1 tablet by mouth daily.     Marland Kitchen aspirin 81 MG tablet Take 81 mg by mouth daily.     . budesonide-formoterol (SYMBICORT) 160-4.5 MCG/ACT inhaler Inhale 2 puffs into the lungs 2 (two) times daily.     . calcium carbonate (OS-CAL) 600 MG TABS tablet Take 600 mg by mouth daily with breakfast.     . FLUZONE HIGH-DOSE 0.5 ML injection     . metoprolol tartrate (LOPRESSOR) 25 MG tablet Take 1 tablet (25 mg total) by mouth 2 (two) times daily. 180 tablet 1  . MULTIPLE VITAMIN PO Take 1 tablet by mouth daily.     . simvastatin (ZOCOR) 40 MG tablet Take 1 tablet (40 mg total) by mouth at bedtime. 90 tablet 3  . tobramycin (TOBREX) 0.3 % ophthalmic solution Place 1 drop into the left eye every 6 (six) hours.     . pantoprazole (PROTONIX) 40 MG tablet Take 1 tablet (40 mg total) by mouth 2 (two) times daily. (Patient not taking: Reported on 01/24/2017) 180 tablet 3   No current facility-administered medications on file prior to visit.    There are no Patient Instructions on file for this visit. Return if symptoms worsen or fail to improve.  Isai Gottlieb A Mikias Lanz, PA-C

## 2017-02-21 ENCOUNTER — Other Ambulatory Visit (INDEPENDENT_AMBULATORY_CARE_PROVIDER_SITE_OTHER): Payer: Self-pay | Admitting: Vascular Surgery

## 2017-02-22 ENCOUNTER — Other Ambulatory Visit: Payer: Self-pay

## 2017-02-22 ENCOUNTER — Encounter
Admission: RE | Admit: 2017-02-22 | Discharge: 2017-02-22 | Disposition: A | Discharge status: Home / Self Care | Payer: Medicare Other | Source: Ambulatory Visit | Attending: Vascular Surgery | Admitting: Vascular Surgery

## 2017-02-22 DIAGNOSIS — Z836 Family history of other diseases of the respiratory system: Secondary | ICD-10-CM | POA: Diagnosis not present

## 2017-02-22 DIAGNOSIS — Z951 Presence of aortocoronary bypass graft: Secondary | ICD-10-CM | POA: Diagnosis not present

## 2017-02-22 DIAGNOSIS — G4730 Sleep apnea, unspecified: Secondary | ICD-10-CM | POA: Diagnosis not present

## 2017-02-22 DIAGNOSIS — I70211 Atherosclerosis of native arteries of extremities with intermittent claudication, right leg: Secondary | ICD-10-CM | POA: Diagnosis not present

## 2017-02-22 DIAGNOSIS — Z87891 Personal history of nicotine dependence: Secondary | ICD-10-CM | POA: Diagnosis not present

## 2017-02-22 DIAGNOSIS — Z7982 Long term (current) use of aspirin: Secondary | ICD-10-CM | POA: Diagnosis not present

## 2017-02-22 DIAGNOSIS — E785 Hyperlipidemia, unspecified: Secondary | ICD-10-CM | POA: Diagnosis not present

## 2017-02-22 DIAGNOSIS — Z882 Allergy status to sulfonamides status: Secondary | ICD-10-CM | POA: Diagnosis not present

## 2017-02-22 DIAGNOSIS — Z79899 Other long term (current) drug therapy: Secondary | ICD-10-CM | POA: Diagnosis not present

## 2017-02-22 DIAGNOSIS — Z881 Allergy status to other antibiotic agents status: Secondary | ICD-10-CM | POA: Diagnosis not present

## 2017-02-22 DIAGNOSIS — Z822 Family history of deafness and hearing loss: Secondary | ICD-10-CM | POA: Diagnosis not present

## 2017-02-22 DIAGNOSIS — I10 Essential (primary) hypertension: Secondary | ICD-10-CM | POA: Diagnosis not present

## 2017-02-22 DIAGNOSIS — Z885 Allergy status to narcotic agent status: Secondary | ICD-10-CM | POA: Diagnosis not present

## 2017-02-22 DIAGNOSIS — Z8249 Family history of ischemic heart disease and other diseases of the circulatory system: Secondary | ICD-10-CM | POA: Diagnosis not present

## 2017-02-22 DIAGNOSIS — Z91040 Latex allergy status: Secondary | ICD-10-CM | POA: Diagnosis not present

## 2017-02-22 DIAGNOSIS — Z90710 Acquired absence of both cervix and uterus: Secondary | ICD-10-CM | POA: Diagnosis not present

## 2017-02-22 DIAGNOSIS — Z9889 Other specified postprocedural states: Secondary | ICD-10-CM | POA: Diagnosis not present

## 2017-02-22 DIAGNOSIS — Z833 Family history of diabetes mellitus: Secondary | ICD-10-CM | POA: Diagnosis not present

## 2017-02-22 HISTORY — DX: Unspecified macular degeneration: H35.30

## 2017-02-22 HISTORY — DX: Chronic obstructive pulmonary disease, unspecified: J44.9

## 2017-02-22 HISTORY — DX: Unspecified osteoarthritis, unspecified site: M19.90

## 2017-02-22 HISTORY — DX: Atherosclerotic heart disease of native coronary artery without angina pectoris: I25.10

## 2017-02-22 HISTORY — DX: Gastro-esophageal reflux disease without esophagitis: K21.9

## 2017-02-22 LAB — CREATININE, SERUM
CREATININE: 1.01 mg/dL — AB (ref 0.44–1.00)
GFR calc Af Amer: 60 mL/min — ABNORMAL LOW (ref >60)
GFR, EST NON AFRICAN AMERICAN: 52 mL/min — AB (ref >60)

## 2017-02-22 LAB — BUN: BUN: 23 mg/dL — AB (ref 6–20)

## 2017-02-22 NOTE — Patient Instructions (Signed)
[facility]  Your procedure is scheduled with Dr. Lucky Cowboy                               On:  Date:   February 25, 2017                 Time: 9:30 am     On arrival go [facility] on first floor of the [facility]. Your arrival time will be approximately 1-1.5 hours prior to you procedure start time. This allows time to check lab results and prep you for the procedure.  If your procedure time change you will be notified by the doctor's office.    _x____Do not eat or drink 8 hours prior to your procedure.    __x___Take all your morning medications with sips of water.   Please call Dr Lucky Cowboy and Dr Nino Parsley office with any questions or concerns: [phone].  You will need to have someone drive you home and stay with you the night of the procedure.

## 2017-02-24 MED ORDER — CEFAZOLIN SODIUM-DEXTROSE 2-4 GM/100ML-% IV SOLN
2.0000 g | Freq: Once | INTRAVENOUS | Status: AC
  Administered 2017-02-25: 2 g via INTRAVENOUS

## 2017-02-25 ENCOUNTER — Encounter
Admission: RE | Disposition: A | Discharge status: Home / Self Care | Payer: Self-pay | Source: Ambulatory Visit | Attending: Vascular Surgery

## 2017-02-25 ENCOUNTER — Ambulatory Visit
Admission: RE | Admit: 2017-02-25 | Discharge: 2017-02-25 | Disposition: A | Discharge status: Home / Self Care | Payer: Medicare Other | Source: Ambulatory Visit | Attending: Vascular Surgery | Admitting: Vascular Surgery

## 2017-02-25 DIAGNOSIS — Z87891 Personal history of nicotine dependence: Secondary | ICD-10-CM | POA: Diagnosis not present

## 2017-02-25 DIAGNOSIS — Z8249 Family history of ischemic heart disease and other diseases of the circulatory system: Secondary | ICD-10-CM | POA: Insufficient documentation

## 2017-02-25 DIAGNOSIS — Z885 Allergy status to narcotic agent status: Secondary | ICD-10-CM | POA: Insufficient documentation

## 2017-02-25 DIAGNOSIS — Z833 Family history of diabetes mellitus: Secondary | ICD-10-CM | POA: Insufficient documentation

## 2017-02-25 DIAGNOSIS — E785 Hyperlipidemia, unspecified: Secondary | ICD-10-CM | POA: Diagnosis not present

## 2017-02-25 DIAGNOSIS — I70211 Atherosclerosis of native arteries of extremities with intermittent claudication, right leg: Secondary | ICD-10-CM | POA: Diagnosis not present

## 2017-02-25 DIAGNOSIS — Z91040 Latex allergy status: Secondary | ICD-10-CM | POA: Diagnosis not present

## 2017-02-25 DIAGNOSIS — G4730 Sleep apnea, unspecified: Secondary | ICD-10-CM | POA: Diagnosis not present

## 2017-02-25 DIAGNOSIS — Z836 Family history of other diseases of the respiratory system: Secondary | ICD-10-CM | POA: Insufficient documentation

## 2017-02-25 DIAGNOSIS — Z90710 Acquired absence of both cervix and uterus: Secondary | ICD-10-CM | POA: Insufficient documentation

## 2017-02-25 DIAGNOSIS — Z7982 Long term (current) use of aspirin: Secondary | ICD-10-CM | POA: Insufficient documentation

## 2017-02-25 DIAGNOSIS — Z881 Allergy status to other antibiotic agents status: Secondary | ICD-10-CM | POA: Diagnosis not present

## 2017-02-25 DIAGNOSIS — Z822 Family history of deafness and hearing loss: Secondary | ICD-10-CM | POA: Insufficient documentation

## 2017-02-25 DIAGNOSIS — Z79899 Other long term (current) drug therapy: Secondary | ICD-10-CM | POA: Diagnosis not present

## 2017-02-25 DIAGNOSIS — I739 Peripheral vascular disease, unspecified: Secondary | ICD-10-CM

## 2017-02-25 DIAGNOSIS — Z951 Presence of aortocoronary bypass graft: Secondary | ICD-10-CM | POA: Insufficient documentation

## 2017-02-25 DIAGNOSIS — I70213 Atherosclerosis of native arteries of extremities with intermittent claudication, bilateral legs: Secondary | ICD-10-CM | POA: Diagnosis not present

## 2017-02-25 DIAGNOSIS — Z882 Allergy status to sulfonamides status: Secondary | ICD-10-CM | POA: Insufficient documentation

## 2017-02-25 DIAGNOSIS — Z9889 Other specified postprocedural states: Secondary | ICD-10-CM | POA: Diagnosis not present

## 2017-02-25 DIAGNOSIS — I10 Essential (primary) hypertension: Secondary | ICD-10-CM | POA: Insufficient documentation

## 2017-02-25 HISTORY — PX: LOWER EXTREMITY INTERVENTION: CATH118252

## 2017-02-25 HISTORY — PX: LOWER EXTREMITY ANGIOGRAPHY: CATH118251

## 2017-02-25 SURGERY — LOWER EXTREMITY ANGIOGRAPHY
Anesthesia: Moderate Sedation | Laterality: Right

## 2017-02-25 MED ORDER — LABETALOL HCL 5 MG/ML IV SOLN: 10.0000 mg | INTRAVENOUS | Status: DC | PRN

## 2017-02-25 MED ORDER — SODIUM CHLORIDE 0.9% FLUSH: 3.0000 mL | Freq: Two times a day (BID) | INTRAVENOUS | Status: DC

## 2017-02-25 MED ORDER — FENTANYL CITRATE (PF) 100 MCG/2ML IJ SOLN
INTRAMUSCULAR | Status: DC | PRN
  Administered 2017-02-25: 12.5 ug via INTRAVENOUS
  Administered 2017-02-25: 50 ug via INTRAVENOUS
  Administered 2017-02-25: 12.5 ug via INTRAVENOUS

## 2017-02-25 MED ORDER — IOPAMIDOL (ISOVUE-300) INJECTION 61%
INTRAVENOUS | Status: DC | PRN
  Administered 2017-02-25: 75 mL via INTRA_ARTERIAL

## 2017-02-25 MED ORDER — SODIUM CHLORIDE 0.9 % IV SOLN
INTRAVENOUS | Status: DC
  Administered 2017-02-25: 09:00:00 via INTRAVENOUS

## 2017-02-25 MED ORDER — CLOPIDOGREL BISULFATE 75 MG PO TABS: 75.0000 mg | ORAL_TABLET | Freq: Every day | ORAL | 11 refills | Status: DC

## 2017-02-25 MED ORDER — HEPARIN SODIUM (PORCINE) 1000 UNIT/ML IJ SOLN
INTRAMUSCULAR | Status: DC | PRN
  Administered 2017-02-25: 4000 [IU] via INTRAVENOUS

## 2017-02-25 MED ORDER — MIDAZOLAM HCL 5 MG/5ML IJ SOLN
INTRAMUSCULAR | Status: AC
  Filled 2017-02-25: qty 5

## 2017-02-25 MED ORDER — HYDRALAZINE HCL 20 MG/ML IJ SOLN: 5.0000 mg | INTRAMUSCULAR | Status: DC | PRN

## 2017-02-25 MED ORDER — CLOPIDOGREL BISULFATE 75 MG PO TABS: 75.0000 mg | ORAL_TABLET | Freq: Every day | ORAL | Status: DC

## 2017-02-25 MED ORDER — ONDANSETRON HCL 4 MG/2ML IJ SOLN: 4.0000 mg | Freq: Four times a day (QID) | INTRAMUSCULAR | Status: DC | PRN

## 2017-02-25 MED ORDER — METHYLPREDNISOLONE SODIUM SUCC 125 MG IJ SOLR: 125.0000 mg | INTRAMUSCULAR | Status: DC | PRN

## 2017-02-25 MED ORDER — CLOPIDOGREL BISULFATE 75 MG PO TABS
ORAL_TABLET | ORAL | Status: AC
  Filled 2017-02-25: qty 1

## 2017-02-25 MED ORDER — LIDOCAINE-EPINEPHRINE (PF) 1 %-1:200000 IJ SOLN
INTRAMUSCULAR | Status: AC
  Filled 2017-02-25: qty 30

## 2017-02-25 MED ORDER — SODIUM CHLORIDE 0.9% FLUSH: 3.0000 mL | INTRAVENOUS | Status: DC | PRN

## 2017-02-25 MED ORDER — HYDROMORPHONE HCL 1 MG/ML IJ SOLN: 1.0000 mg | Freq: Once | INTRAMUSCULAR | Status: DC | PRN

## 2017-02-25 MED ORDER — MIDAZOLAM HCL 2 MG/2ML IJ SOLN
INTRAMUSCULAR | Status: DC | PRN
  Administered 2017-02-25: 2 mg via INTRAVENOUS
  Administered 2017-02-25 (×2): 0.5 mg via INTRAVENOUS

## 2017-02-25 MED ORDER — HEPARIN SODIUM (PORCINE) 1000 UNIT/ML IJ SOLN
INTRAMUSCULAR | Status: AC
  Filled 2017-02-25: qty 1

## 2017-02-25 MED ORDER — HEPARIN (PORCINE) IN NACL 2-0.9 UNIT/ML-% IJ SOLN
INTRAMUSCULAR | Status: AC
  Filled 2017-02-25: qty 1000

## 2017-02-25 MED ORDER — SODIUM CHLORIDE 0.9 % IV SOLN: INTRAVENOUS | Status: DC

## 2017-02-25 MED ORDER — SODIUM CHLORIDE 0.9 % IV SOLN: 250.0000 mL | INTRAVENOUS | Status: DC | PRN

## 2017-02-25 MED ORDER — FENTANYL CITRATE (PF) 100 MCG/2ML IJ SOLN
INTRAMUSCULAR | Status: AC
  Filled 2017-02-25: qty 2

## 2017-02-25 MED ORDER — FAMOTIDINE 20 MG PO TABS: 40.0000 mg | ORAL_TABLET | ORAL | Status: DC | PRN

## 2017-02-25 MED ORDER — CLOPIDOGREL BISULFATE 75 MG PO TABS
75.0000 mg | ORAL_TABLET | Freq: Once | ORAL | Status: AC
  Administered 2017-02-25: 75 mg via ORAL

## 2017-02-25 SURGICAL SUPPLY — 24 items
BALLN DORADO 8X20X80 (BALLOONS) ×4
BALLN DORADO 8X40X80 (BALLOONS) ×4
BALLN LUTONIX DCB 6X40X130 (BALLOONS) ×8
BALLN ULTRVRSE 4X40X75C (BALLOONS) ×4
BALLN ULTRVRSE 8X40X75C (BALLOONS) ×4
BALLOON DORADO 8X20X80 (BALLOONS) ×2 IMPLANT
BALLOON DORADO 8X40X80 (BALLOONS) ×2 IMPLANT
BALLOON LUTONIX DCB 6X40X130 (BALLOONS) ×4 IMPLANT
BALLOON ULTRVRSE 4X40X75C (BALLOONS) ×2 IMPLANT
BALLOON ULTRVRSE 8X40X75C (BALLOONS) ×2 IMPLANT
CATH BEACON 5 .035 40 KMP TP (CATHETERS) ×2 IMPLANT
CATH BEACON 5 .038 40 KMP TP (CATHETERS) ×2
CATH PIG 70CM (CATHETERS) ×4 IMPLANT
COVER PROBE U/S 5X48 (MISCELLANEOUS) ×8 IMPLANT
DEVICE PRESTO INFLATION (MISCELLANEOUS) ×8 IMPLANT
DEVICE STARCLOSE SE CLOSURE (Vascular Products) ×8 IMPLANT
GLIDEWIRE ADV .035X180CM (WIRE) ×4 IMPLANT
PACK ANGIOGRAPHY (CUSTOM PROCEDURE TRAY) ×4 IMPLANT
SHEATH BRITE TIP 5FRX11 (SHEATH) ×4 IMPLANT
SHEATH BRITE TIP 6FRX11 (SHEATH) ×8 IMPLANT
STENT LIFESTREAM 6X37X80 (Permanent Stent) ×4 IMPLANT
STENT LIFESTREAM 7X26X80 (Permanent Stent) ×8 IMPLANT
WIRE J 3MM .035X145CM (WIRE) ×4 IMPLANT
WIRE MAGIC TOR.035 180C (WIRE) ×4 IMPLANT

## 2017-02-25 NOTE — Discharge Instructions (Signed)
Groin Insertion Instructions-If you lose feeling or develop tingling or pain in your leg or foot after the procedure, please walk around first.  If the discomfort does not improve , contact your physician and proceed to the nearest emergency room.  Loss of feeling in your leg might mean that a blockage has formed in the artery and this can be appropriately treated.  Limit your activity for the next two days after your procedure.  Avoid stooping, bending, heavy lifting or exertion as this may put pressure on the insertion site.  Resume normal activities in 48 hours.  You may shower after 24 hours but avoid excessive warm water and do not scrub the site.  Remove clear dressing in 48 hours.  If you have had a closure device inserted, do not soak in a tub bath or a hot tub for at least one week. ° °No driving for 48 hours after discharge.  After the procedure, check the insertion site occasionally.  If any oozing occurs or there is apparent swelling, firm pressure over the site will prevent a bruise from forming.  You can not hurt anything by pressing directly on the site.  The pressure stops the bleeding by allowing a small clot to form.  If the bleeding continues after the pressure has been applied for more than 15 minutes, call 911 or go to the nearest emergency room.   ° °The x-ray dye causes you to pass a considerate amount of urine.  For this reason, you will be asked to drink plenty of liquids after the procedure to prevent dehydration.  You may resume you regular diet.  Avoid caffeine products.   ° °For pain at the site of your procedure, take non-aspirin medicines such as Tylenol. ° °Medications: A. Hold Metformin for 48 hours if applicable.  B. Continue taking all your present medications at home unless your doctor prescribes any changes. ° °Moderate Conscious Sedation, Adult, Care After °These instructions provide you with information about caring for yourself after your procedure. Your health care provider  may also give you more specific instructions. Your treatment has been planned according to current medical practices, but problems sometimes occur. Call your health care provider if you have any problems or questions after your procedure. °What can I expect after the procedure? °After your procedure, it is common: °· To feel sleepy for several hours. °· To feel clumsy and have poor balance for several hours. °· To have poor judgment for several hours. °· To vomit if you eat too soon. ° °Follow these instructions at home: °For at least 24 hours after the procedure: ° °· Do not: °? Participate in activities where you could fall or become injured. °? Drive. °? Use heavy machinery. °? Drink alcohol. °? Take sleeping pills or medicines that cause drowsiness. °? Make important decisions or sign legal documents. °? Take care of children on your own. °· Rest. °Eating and drinking °· Follow the diet recommended by your health care provider. °· If you vomit: °? Drink water, juice, or soup when you can drink without vomiting. °? Make sure you have little or no nausea before eating solid foods. °General instructions °· Have a responsible adult stay with you until you are awake and alert. °· Take over-the-counter and prescription medicines only as told by your health care provider. °· If you smoke, do not smoke without supervision. °· Keep all follow-up visits as told by your health care provider. This is important. °Contact a health care provider   if: °· You keep feeling nauseous or you keep vomiting. °· You feel light-headed. °· You develop a rash. °· You have a fever. °Get help right away if: °· You have trouble breathing. °This information is not intended to replace advice given to you by your health care provider. Make sure you discuss any questions you have with your health care provider. °Document Released: 11/12/2012 Document Revised: 06/27/2015 Document Reviewed: 05/14/2015 °Elsevier Interactive Patient Education © 2018  Elsevier Inc. ° °

## 2017-02-25 NOTE — Op Note (Signed)
[facility] Percutaneous Study/Intervention Procedural Note   Date of Surgery: 02/25/2017  Surgeon(s):Zarif Rathje   Assistants:none  Pre-operative Diagnosis: PAD with claudication right greater than left  Post-operative diagnosis: Same  Procedure(s) Performed: 1. Ultrasound guidance for vascular access bilateral femoral arteries 2. Catheter placement into aorta from bilateral femoral approaches 3. Aortogram and selective right lower extremity angiogram 4. Percutaneous transluminal angioplasty of bilateral common iliac arteries with 6 millimeter Lutonix drug-coated angioplasty balloons in a kissing balloon fashion 5. Covered stent placement to bilateral common iliac arteries in a kissing balloon fashion with a 7 mm diameter by 26 mm length stent on the left and a 7 mm diameter by 26 mm and a 6 mm diameter by 37 stent on the right  6. StarClose closure device bilateral femoral arteries  EBL: 15 cc  Contrast: 75 cc  Fluoro Time: 7 minutes  Moderate Conscious Sedation Time: approximately 40 minutes using 3 mg of Versed and 75 Mcg of Fentanyl  Indications: Patient is a 82 y.o.female with lifestyle limiting claudication particularly in the right leg. The patient has noninvasive study showing right iliac and femoral disease as well as some possible left iliac disease although the ABI was preserved left. The patient is brought in for angiography for further evaluation and potential treatment. Risks and benefits are discussed and informed consent is obtained.  Procedure: The patient was identified and appropriate procedural time out was performed. The patient was then placed supine on the table and prepped and draped in the usual sterile fashion.Moderate conscious sedation was administered during a face to face encounter with the patient throughout the procedure with my supervision of the  RN administering medicines and monitoring the patient's vital signs, pulse oximetry, telemetry and mental status throughout from the start of the procedure until the patient was taken to the recovery room. Ultrasound was used to evaluate the left common femoral artery. It was patent . A digital ultrasound image was acquired. A Seldinger needle was used to access the left common femoral artery under direct ultrasound guidance and a permanent image was performed. A 0.035 J wire was advanced without resistance and a 5Fr sheath was placed. Pigtail catheter was placed into the aorta and an AP aortogram was performed. This demonstrated normal renal arteries and a calcific but not stenotic aorta.  The left common iliac artery had a very calcific stenosis in the 70% range.  The right common iliac artery had a near occlusive stenosis which was also heavily calcific.  The right hypogastric artery was not really seen on initial limb hypogastric artery appeared to be patent.  The external iliac arteries did not have any stenosis on either side. I then elected to access the right common femoral artery.  This was done in a retrograde fashion under ultrasound with difficulty with a Seldinger needle and a permanent image was recorded.  The patient was systemically heparinized and a 6 French sheaths were placed on both sides.  I then performed right lower extremity.  This demonstrated disease at the femoral bifurcation with the proximal SFA having greater than 80% stenosis.  There was a lesser degree of stenosis in the distal common femoral artery and the profunda femoris artery although significant plaque was present.  The SFA then normalized within a few centimeters of its origin and had a reasonably normal course without significant stenosis throughout the remainder of its course.  The popliteal artery was patent.  There was then two-vessel runoff distally although it was somewhat  sluggish due to the inflow disease. I then  used a Kumpe catheter and the advantage wire to navigate through the common iliac artery stenosis on the right and confirm intraluminal flow in the aorta.  We now had wire access into the aorta from both approaches and I proceeded with treatment.  These were very densely calcified lesion so I elected to predilate and a 6 mm diameter by 4 cm length Lutonix drug-coated angioplasty balloon was inflated simultaneously in both common iliac arteries kissing in the distal aorta.  These were taken up to 10 atm.  I then selected a 7 mm diameter by 26 mm length Lifestream stents up both femoral sheaths.  These were then deployed but the lesion on the right did not get treated as the stent would not cross initially into the distal aorta.  The initial lifestream stent was deployed in the distal right common iliac artery and then postdilated with an 8 mm balloon to seated there.  After another predilatation of the right common iliac artery, I used a 6 mm diameter by 37 mm stent and this was able to easily cross the lesion up into the distal aorta.  The left-sided lifestream stent was also parked with its proximal stent in the distal aorta and these balloon expandable stents were deployed simultaneously encompassing the lesions in the common iliac artery.  There was still significant waist on both sides but worse on the right due to the calcific nature of the disease and upsized to 8 mm diameter high-pressure angioplasty balloons to post dilate the covered stents.  These were inflated up to 20 atm with the proximal extent of the balloon into the distal aorta finally resolving the waist.  Completion angiogram demonstrated up 15-20% residual stenosis in the right common 10% residual stenosis in the left common there of which were flow-limiting with much more brisk flow distally. I elected to terminate the procedure. The sheath was removed on the left side first and StarClose closure device was deployed in the left femoral artery  with excellent hemostatic result.  StarClose closure device was then deployed in the right femoral artery with excellent hemostatic result as well.  The patient was taken to the recovery room in stable condition having tolerated the procedure well.  Findings:  Aortogram: This demonstrated normal renal arteries and a calcific but not stenotic aorta.  The left common iliac artery had a very calcific stenosis in the 70% range.  The right common iliac artery had a near occlusive stenosis which was also heavily calcific.  The right hypogastric artery was not really seen on initial limb hypogastric artery appeared to be patent.  The external iliac arteries did not have any stenosis on either side Right lower Extremity:Disease at the femoral bifurcation with the proximal SFA having greater than 80% stenosis.  There was a lesser degree of stenosis in the distal common femoral artery and the profunda femoris artery although significant plaque was present.  The SFA then normalized within a few centimeters of its origin and had a reasonably normal course without significant stenosis throughout the remainder of its course.  The popliteal artery was patent.  There was then two-vessel runoff distally although it was somewhat sluggish due to the inflow disease   Disposition: Patient was taken to the recovery room in stable condition having tolerated the procedure well.  Complications: None  Christina [name] 02/25/2017 11:46 AM   This note was created with Dragon Medical transcription system. Any errors in dictation are  purely unintentional.

## 2017-02-25 NOTE — H&P (Signed)
[facility] History & Physical Update  The patient was interviewed and re-examined.  The patient's previous History and Physical has been reviewed and is unchanged.  There is no change in the plan of care. We plan to proceed with the scheduled procedure.  Leotis Pain, MD  02/25/2017, 8:41 AM

## 2017-02-26 ENCOUNTER — Encounter: Payer: Self-pay | Admitting: Vascular Surgery

## 2017-03-05 ENCOUNTER — Telehealth (INDEPENDENT_AMBULATORY_CARE_PROVIDER_SITE_OTHER): Payer: Self-pay | Admitting: Vascular Surgery

## 2017-03-05 NOTE — Telephone Encounter (Signed)
Patient states that she does not have a prescription for the stockings and had no idea that she was supposed to be wearing them. She will be sending her husband by to pick up the prescription so that she can get some today. He will be by in the next hour to pick this up.

## 2017-03-05 NOTE — Telephone Encounter (Signed)
New Message  Pt verbalized she had procedure Monday, LE Angio, a week ago. Pt verbalized her left leg is bothering her along with swelling.   Pt verbalized she wants to know what she can do for her Ankle or Foot with it feeling real tight.  Please f/u with pt

## 2017-03-11 ENCOUNTER — Telehealth (INDEPENDENT_AMBULATORY_CARE_PROVIDER_SITE_OTHER): Payer: Self-pay | Admitting: Vascular Surgery

## 2017-03-11 NOTE — Telephone Encounter (Signed)
Called the patient to give her the instructions that Maudie Mercury had advised her to do. The patient agrees that if she is not feeling better by Wednesday or Thursday of this week, she will call back to schedule the ABI to check her leg. She is going to take Tylenol for her pain. She did state that her leg has a camoflauge look to it and that the top of her foot is a bluish color and that the swelling is going down some.

## 2017-03-11 NOTE — Telephone Encounter (Signed)
The patient has had peripheral artery disease for several months before undergoing an intervention endovascularly - this is most likely reperfusion syndrome.  Experiencing some discomfort, erythema and edema to the leg which received the endovascular intervention is normal for up to 4-6 weeks however it should progressively improve.  If the patient's foot is white or bluish in color this would indicate that there may be a more serious problem.  The patient should elevate her leg, take ibuprofen if she is not on a blood thinner or Tylenol.  I do not know what the ultrasound schedule looks like however an ABI would be helpful even if it is not attached to an office visit.

## 2017-03-11 NOTE — Telephone Encounter (Signed)
New Message  Pt verbalized her left leg and foot from procedure on 16th of January, has been swollen really bad and in pain.  Pt verbalized she does not know what to do and possibly wants to be seen today.  Please f/u

## 2017-03-13 ENCOUNTER — Other Ambulatory Visit (INDEPENDENT_AMBULATORY_CARE_PROVIDER_SITE_OTHER): Payer: Self-pay | Admitting: Vascular Surgery

## 2017-03-13 ENCOUNTER — Ambulatory Visit (INDEPENDENT_AMBULATORY_CARE_PROVIDER_SITE_OTHER): Payer: Medicare Other

## 2017-03-13 ENCOUNTER — Ambulatory Visit (INDEPENDENT_AMBULATORY_CARE_PROVIDER_SITE_OTHER): Payer: Medicare Other | Admitting: Vascular Surgery

## 2017-03-13 ENCOUNTER — Encounter (INDEPENDENT_AMBULATORY_CARE_PROVIDER_SITE_OTHER): Payer: Self-pay | Admitting: Vascular Surgery

## 2017-03-13 VITALS — BP 150/76 | HR 66 | Resp 16 | Ht 65.0 in | Wt 175.0 lb

## 2017-03-13 DIAGNOSIS — M7989 Other specified soft tissue disorders: Secondary | ICD-10-CM

## 2017-03-13 DIAGNOSIS — E7800 Pure hypercholesterolemia, unspecified: Secondary | ICD-10-CM | POA: Diagnosis not present

## 2017-03-13 DIAGNOSIS — M79671 Pain in right foot: Secondary | ICD-10-CM

## 2017-03-13 DIAGNOSIS — L819 Disorder of pigmentation, unspecified: Secondary | ICD-10-CM

## 2017-03-13 DIAGNOSIS — I10 Essential (primary) hypertension: Secondary | ICD-10-CM

## 2017-03-13 DIAGNOSIS — I739 Peripheral vascular disease, unspecified: Secondary | ICD-10-CM

## 2017-03-13 NOTE — Progress Notes (Signed)
Subjective:    Patient ID: Christina [name], female    DOB: 02-Jul-1935, 82 y.o.   MRN: [id number] Chief Complaint  Patient presents with  . Follow-up    ABI, having swelling   Patient presents sooner than her originally scheduled post procedure follow-up.  Since her bilateral common iliac stent placement on February 25, 2017 the patient has been experiencing bilateral lower extremity swelling and discomfort.  The patient's swelling and discomfort to the left lower extremity is greater when compared to the right.  The patient denies any rest pain or ulceration to the bilateral lower extremity.  The patient denies any fever, nausea or vomiting.  The patient underwent a stat bilateral ABI which was notable for triphasic tibials bilaterally.  Doppler waveforms suggest no significant lower extremity arterial disease.  The bilateral toe brachial indices are normal.  There is a significant increase in the right ankle-brachial index when compared to the previous exam with the left ankle brachial index remaining stable.  Previous exam was on December 10, 2016.   Review of Systems  Constitutional: Negative.   HENT: Negative.   Eyes: Negative.   Respiratory: Negative.   Cardiovascular: Positive for leg swelling.       Lower extremity pain  Gastrointestinal: Negative.   Endocrine: Negative.   Genitourinary: Negative.   Musculoskeletal: Negative.   Skin: Negative.   Allergic/Immunologic: Negative.   Neurological: Negative.   Hematological: Negative.   Psychiatric/Behavioral: Negative.       Objective:   Physical Exam  Constitutional: She is oriented to person, place, and time. She appears well-developed and well-nourished. No distress.  HENT:  Head: Normocephalic and atraumatic.  Eyes: Conjunctivae are normal. Pupils are equal, round, and reactive to light.  Neck: Normal range of motion.  Cardiovascular: Normal rate, regular rhythm, normal heart sounds and intact distal pulses.  Pulses:     Radial pulses are 2+ on the right side, and 2+ on the left side.       Dorsalis pedis pulses are 1+ on the right side, and 1+ on the left side.       Posterior tibial pulses are 1+ on the right side, and 1+ on the left side.  Pulmonary/Chest: Effort normal and breath sounds normal.  Musculoskeletal: She exhibits edema (Mild to moderate left lower extremity nonpitting edema noted.  Mild right lower extremity edema noted).  Neurological: She is alert and oriented to person, place, and time.  Skin: She is not diaphoretic.  There is no cellulitis to the bilateral lower extremity.  Skin is intact.  Psychiatric: She has a normal mood and affect. Her behavior is normal. Judgment and thought content normal.  Vitals reviewed.  BP (!) 150/76 (BP Location: Right Arm, Patient Position: Sitting)   Pulse 66   Resp 16   Ht 5\' 5"  (1.651 m)   Wt 175 lb (79.4 kg)   BMI 29.12 kg/m   Past Medical History:  Diagnosis Date  . Arthritis   . COPD (chronic obstructive pulmonary disease) ([place])   . Coronary artery disease   . GERD (gastroesophageal reflux disease)   . Hyperlipidemia   . Hypertension   . Macular degeneration   . PAD (peripheral artery disease) ([place])   . Sleep apnea    Social History   Socioeconomic History  . Marital status: Married    Spouse name: Cliffton Mathis Bud  . Number of children: 2  . Years of education: 34  . Highest education level: 12th grade  Social Needs  . Financial resource strain: Not hard at all  . Food insecurity - worry: Never true  . Food insecurity - inability: Never true  . Transportation needs - medical: No  . Transportation needs - non-medical: No  Occupational History  . Occupation: retired  Tobacco Use  . Smoking status: Former Smoker    Packs/day: 0.50    Years: 5.00    Pack years: 2.50    Last attempt to quit: 04/24/2004    Years since quitting: 12.8  . Smokeless tobacco: Never Used  Substance and Sexual Activity  . Alcohol use: No  .  Drug use: No  . Sexual activity: Yes  Other Topics Concern  . Not on file  Social History Narrative  . Not on file   Past Surgical History:  Procedure Laterality Date  . ABDOMINAL HYSTERECTOMY  1980   due to dysfunctional uterine bleeding  . APPENDECTOMY  1980  . BREAST SURGERY Left 2000   biopsy  . CARDIAC CATHETERIZATION    . COLONOSCOPY    . CORONARY ARTERY BYPASS GRAFT  2006  . HEMORRHOID SURGERY    . LOWER EXTREMITY ANGIOGRAPHY Right 02/25/2017   Procedure: LOWER EXTREMITY ANGIOGRAPHY;  Surgeon: Algernon Huxley, MD;  Location: [facility];  Service: Cardiovascular;  Laterality: Right;  . LOWER EXTREMITY INTERVENTION  02/25/2017   Procedure: LOWER EXTREMITY INTERVENTION;  Surgeon: Algernon Huxley, MD;  Location: [facility];  Service: Cardiovascular;;  . LUNG BIOPSY  1999   Negative  . TONSILLECTOMY     Family History  Problem Relation Age of Onset  . Hypertension Mother   . Hyperlipidemia Mother   . Alzheimer's disease Mother   . CAD Mother   . Heart attack Father   . Lung disease Sister   . Heart disease Sister   . Diabetes Paternal Grandfather        Type 2  . Hearing loss Son    Allergies  Allergen Reactions  . Baclofen Other (See Comments)  . Codeine Nausea Only  . Sulfa Antibiotics Nausea Only  . Latex Rash      Assessment & Plan:  Patient presents sooner than her originally scheduled post procedure follow-up.  Since her bilateral common iliac stent placement on February 25, 2017 the patient has been experiencing bilateral lower extremity swelling and discomfort.  The patient's swelling and discomfort to the left lower extremity is greater when compared to the right.  The patient denies any rest pain or ulceration to the bilateral lower extremity.  The patient denies any fever, nausea or vomiting.  The patient underwent a stat bilateral ABI which was notable for triphasic tibials bilaterally.  Doppler waveforms suggest no significant lower extremity  arterial disease.  The bilateral toe brachial indices are normal.  There is a significant increase in the right ankle-brachial index when compared to the previous exam with the left ankle brachial index remaining stable.  Previous exam was on December 10, 2016.  1. PAD (peripheral artery disease) ([place]) - Stable Patient is status post bilateral iliac stent angioplasty and stent placement on February 25, 2017 The patient underwent a stat ABI which was notable for an improvement in right lower extremity arterial flow.  There is now no significant lower extremity arterial disease. The patient's edema and discomfort is most likely the result of reperfusion due to her recent endovascular intervention I had a long conversation with the patient in regard to the reperfusion, causes, symptoms and that  it can last up to 4-6 weeks. The patient was offered tramadol for pain control however she refused The patient was encouraged to wear compression stockings, elevate her legs and remaining active The patient would like to keep her February 25 appointment  2. Pure hypercholesterolemia - Stable Encouraged good control as its slows the progression of atherosclerotic disease  3. Essential hypertension - Stable Encouraged good control as its slows the progression of atherosclerotic disease  Current Outpatient Medications on File Prior to Visit  Medication Sig Dispense Refill  . acetaminophen (TYLENOL) 500 MG tablet Take 500 mg by mouth every 8 (eight) hours as needed for mild pain or moderate pain.     Marland Kitchen albuterol (PROVENTIL HFA;VENTOLIN HFA) 108 (90 Base) MCG/ACT inhaler Inhale 2 puffs into the lungs every 6 (six) hours as needed for wheezing or shortness of breath. 1 Inhaler 0  . Ascorbic Acid (VITAMIN C) 1000 MG tablet Take 1,000 mg by mouth daily.    Marland Kitchen aspirin 81 MG tablet Take 81 mg by mouth daily.     . budesonide-formoterol (SYMBICORT) 160-4.5 MCG/ACT inhaler Inhale 2 puffs into the lungs 2 (two) times  daily.     . Calcium Carb-Cholecalciferol (CALCIUM 600+D3 PO) Take 1 tablet by mouth daily.    . clopidogrel (PLAVIX) 75 MG tablet Take 1 tablet (75 mg total) by mouth daily. 30 tablet 11  . docusate sodium (COLACE) 100 MG capsule Take 100 mg by mouth at bedtime.    . metoprolol tartrate (LOPRESSOR) 25 MG tablet Take 1 tablet (25 mg total) by mouth 2 (two) times daily. 180 tablet 1  . MULTIPLE VITAMIN PO Take 1 tablet by mouth daily.     . pantoprazole (PROTONIX) 40 MG tablet Take 40 mg by mouth 2 (two) times daily.    . simvastatin (ZOCOR) 40 MG tablet Take 1 tablet (40 mg total) by mouth at bedtime. 90 tablet 3   No current facility-administered medications on file prior to visit.    There are no Patient Instructions on file for this visit. No Follow-up on file.  Waylen Depaolo A Elye Harmsen, PA-C

## 2017-03-25 ENCOUNTER — Ambulatory Visit: Payer: Self-pay | Admitting: Physician Assistant

## 2017-03-25 ENCOUNTER — Encounter: Payer: Self-pay | Admitting: Family Medicine

## 2017-03-25 ENCOUNTER — Ambulatory Visit (INDEPENDENT_AMBULATORY_CARE_PROVIDER_SITE_OTHER): Payer: Medicare Other | Admitting: Family Medicine

## 2017-03-25 VITALS — BP 174/80 | HR 83 | Temp 98.7°F | Resp 16 | Wt 175.0 lb

## 2017-03-25 DIAGNOSIS — R059 Cough, unspecified: Secondary | ICD-10-CM

## 2017-03-25 DIAGNOSIS — J329 Chronic sinusitis, unspecified: Secondary | ICD-10-CM

## 2017-03-25 DIAGNOSIS — R05 Cough: Secondary | ICD-10-CM | POA: Diagnosis not present

## 2017-03-25 MED ORDER — AZITHROMYCIN 250 MG PO TABS: ORAL_TABLET | ORAL | 0 refills | Status: AC

## 2017-03-25 NOTE — Patient Instructions (Signed)
   You can take OTC Clor-trimeton and Delsym for you cough

## 2017-03-25 NOTE — Progress Notes (Signed)
Patient: Christina [name] Female    DOB: Jun 18, 1935   82 y.o.   MRN: [id number] Visit Date: 03/25/2017  Today's Provider: Lelon Huh, MD   Chief Complaint  Patient presents with  . Cough   Subjective:    Patient has had productive cough for 3 days. Patient also has symptoms of nasal congestion, runny nose, shortness of breath and wheezing. Patient has been using her prescribed inhalers and an otc allergy medication with mild relief. Patient states she feels like she has mucus in the back of her throat all the time.    Cough  This is a new problem. The current episode started in the past 7 days (3 days). The problem has been gradually worsening. The cough is productive of sputum. Associated symptoms include nasal congestion, postnasal drip, rhinorrhea, shortness of breath and wheezing. Pertinent negatives include no chest pain, chills, ear congestion, ear pain, fever, headaches, heartburn, hemoptysis, myalgias, rash, sore throat, sweats or weight loss. The symptoms are aggravated by lying down and exercise. She has tried rest for the symptoms. The treatment provided mild relief. Her past medical history is significant for bronchitis and COPD.       Allergies  Allergen Reactions  . Baclofen Other (See Comments)  . Codeine Nausea Only  . Sulfa Antibiotics Nausea Only  . Latex Rash     Current Outpatient Medications:  .  acetaminophen (TYLENOL) 500 MG tablet, Take 500 mg by mouth every 8 (eight) hours as needed for mild pain or moderate pain. , Disp: , Rfl:  .  albuterol (PROVENTIL HFA;VENTOLIN HFA) 108 (90 Base) MCG/ACT inhaler, Inhale 2 puffs into the lungs every 6 (six) hours as needed for wheezing or shortness of breath., Disp: 1 Inhaler, Rfl: 0 .  Ascorbic Acid (VITAMIN C) 1000 MG tablet, Take 1,000 mg by mouth daily., Disp: , Rfl:  .  aspirin 81 MG tablet, Take 81 mg by mouth daily. , Disp: , Rfl:  .  budesonide-formoterol (SYMBICORT) 160-4.5 MCG/ACT inhaler, Inhale  2 puffs into the lungs 2 (two) times daily. , Disp: , Rfl:  .  Calcium Carb-Cholecalciferol (CALCIUM 600+D3 PO), Take 1 tablet by mouth daily., Disp: , Rfl:  .  clopidogrel (PLAVIX) 75 MG tablet, Take 1 tablet (75 mg total) by mouth daily., Disp: 30 tablet, Rfl: 11 .  docusate sodium (COLACE) 100 MG capsule, Take 100 mg by mouth at bedtime., Disp: , Rfl:  .  metoprolol tartrate (LOPRESSOR) 25 MG tablet, Take 1 tablet (25 mg total) by mouth 2 (two) times daily., Disp: 180 tablet, Rfl: 1 .  MULTIPLE VITAMIN PO, Take 1 tablet by mouth daily. , Disp: , Rfl:  .  pantoprazole (PROTONIX) 40 MG tablet, Take 40 mg by mouth 2 (two) times daily., Disp: , Rfl:  .  simvastatin (ZOCOR) 40 MG tablet, Take 1 tablet (40 mg total) by mouth at bedtime., Disp: 90 tablet, Rfl: 3  Review of Systems  Constitutional: Negative for chills, fever and weight loss.  HENT: Positive for congestion, postnasal drip and rhinorrhea. Negative for ear pain, sinus pressure, sinus pain and sore throat.   Respiratory: Positive for cough, shortness of breath and wheezing. Negative for hemoptysis.   Cardiovascular: Negative for chest pain.  Gastrointestinal: Negative for heartburn.  Musculoskeletal: Negative for myalgias.  Skin: Negative for rash.  Neurological: Negative for headaches.    Social History   Tobacco Use  . Smoking status: Former Smoker    Packs/day: 0.50  Years: 5.00    Pack years: 2.50    Last attempt to quit: 04/24/2004    Years since quitting: 12.9  . Smokeless tobacco: Never Used  Substance Use Topics  . Alcohol use: No   Objective:   BP (!) 174/80 (BP Location: Right Arm, Patient Position: Sitting, Cuff Size: Normal)   Pulse 83   Temp 98.7 F (37.1 C) (Oral)   Resp 16   Wt 175 lb (79.4 kg)   SpO2 94%   BMI 29.12 kg/m  Vitals:   03/25/17 1456  BP: (!) 174/80  Pulse: 83  Resp: 16  Temp: 98.7 F (37.1 C)  TempSrc: Oral  SpO2: 94%  Weight: 175 lb (79.4 kg)     Physical Exam  General  Appearance:    Alert, cooperative, no distress  HENT:   bilateral TM normal without fluid or infection, neck without nodes, throat normal without erythema or exudate, frontal  sinuses tender and nasal mucosa congested  Eyes:    PERRL, conjunctiva/corneas clear, EOM's intact       Lungs:     Clear to auscultation bilaterally, respirations unlabored  Heart:    Regular rate and rhythm  Neurologic:   Awake, alert, oriented x 3. No apparent focal neurological           defect.           Assessment & Plan:     1. Sinusitis, unspecified chronicity, unspecified location  - azithromycin (ZITHROMAX) 250 MG tablet; 2 by mouth today, then 1 daily for 4 days  Dispense: 6 tablet; Refill: 0  2. Cough   Patient Instructions   You can take OTC Clor-trimeton and Delsym for you cough        Lelon Huh, MD  [place]

## 2017-04-01 ENCOUNTER — Encounter (INDEPENDENT_AMBULATORY_CARE_PROVIDER_SITE_OTHER): Payer: Medicare Other

## 2017-04-01 ENCOUNTER — Ambulatory Visit (INDEPENDENT_AMBULATORY_CARE_PROVIDER_SITE_OTHER): Payer: Medicare Other | Admitting: Vascular Surgery

## 2017-04-17 ENCOUNTER — Other Ambulatory Visit: Payer: Self-pay

## 2017-04-17 MED ORDER — ALBUTEROL SULFATE HFA 108 (90 BASE) MCG/ACT IN AERS: 2.0000 | INHALATION_SPRAY | Freq: Four times a day (QID) | RESPIRATORY_TRACT | 5 refills | Status: DC | PRN

## 2017-04-17 NOTE — Telephone Encounter (Signed)
Pharmacy requesting refills on inhaler. Thanks!

## 2017-05-13 ENCOUNTER — Telehealth (INDEPENDENT_AMBULATORY_CARE_PROVIDER_SITE_OTHER): Payer: Self-pay

## 2017-05-13 NOTE — Telephone Encounter (Signed)
I inform the patient with Christina [name] advice to stop taking clopidogrel 75mg  and just take aspirin 81mg  and the patient stated she will call the office to make her appointment with abi to see Christina [name] or [place]

## 2017-06-05 ENCOUNTER — Ambulatory Visit: Payer: Medicare Other

## 2017-06-05 ENCOUNTER — Ambulatory Visit: Payer: Self-pay

## 2017-06-05 DIAGNOSIS — G4733 Obstructive sleep apnea (adult) (pediatric): Secondary | ICD-10-CM

## 2017-06-05 NOTE — Progress Notes (Signed)
95 percentile pressure 12cwp   95th percentile leak 50.2   apnea index 1.4 /hr  apnea-hypopnea index  2.9 /hr   total days used  >4 hr 178 days  total days used <4 hr 2 days  Total compliance 99 percent  Went over ordering supplies with Mrs. Grandville Silos. Patient doing great with cpap

## 2017-06-17 ENCOUNTER — Ambulatory Visit: Payer: Medicare Other | Admitting: Internal Medicine

## 2017-06-17 ENCOUNTER — Encounter: Payer: Self-pay | Admitting: Internal Medicine

## 2017-06-17 VITALS — BP 173/76 | HR 65 | Resp 16 | Ht 65.0 in | Wt 177.0 lb

## 2017-06-17 DIAGNOSIS — I739 Peripheral vascular disease, unspecified: Secondary | ICD-10-CM | POA: Diagnosis not present

## 2017-06-17 DIAGNOSIS — Z9989 Dependence on other enabling machines and devices: Secondary | ICD-10-CM | POA: Diagnosis not present

## 2017-06-17 DIAGNOSIS — J449 Chronic obstructive pulmonary disease, unspecified: Secondary | ICD-10-CM | POA: Diagnosis not present

## 2017-06-17 DIAGNOSIS — N183 Chronic kidney disease, stage 3 unspecified: Secondary | ICD-10-CM

## 2017-06-17 DIAGNOSIS — G4733 Obstructive sleep apnea (adult) (pediatric): Secondary | ICD-10-CM | POA: Diagnosis not present

## 2017-06-17 DIAGNOSIS — K219 Gastro-esophageal reflux disease without esophagitis: Secondary | ICD-10-CM | POA: Diagnosis not present

## 2017-06-17 NOTE — Patient Instructions (Signed)

## 2017-06-17 NOTE — Progress Notes (Signed)
[facility] [place], [place] [address]  Pulmonary Sleep Medicine   Office Visit Note  Patient Name: Christina [name] DOB: 1935/06/08 MRN [id number]  Date of Service: 06/17/2017  Complaints/HPI:   Patient was seen by vascular surgery had revascularization of her lower extremities.  She states that she has had a lot have and edema has resolved per the patient states that it is getting better but not quite back to baseline.  She is scheduled to see her vascular surgeon in 1. Reviewing notes it appears that they felt swelling was secondary to vascular is a shin.  As far as her breathing is concern is doing fairly well.  Her FEV1 was 75% predicted and is not really limiting her as far as activity.  Her limitation comes more from her vascular issues than any rales.  As far as her sleep is concerned she is using her CPAP she has other issues where she has racing thoughts and has and is going to slow originally needs to work herself down to try to get to sleep.  She is compliant with her CPAP with the last down mode showing 99% compliance  ROS  General: (-) fever, (-) chills, (-) night sweats, (-) weakness Skin: (-) rashes, (-) itching,. Eyes: (-) visual changes, (-) redness, (-) itching. Nose and Sinuses: (-) nasal stuffiness or itchiness, (-) postnasal drip, (-) nosebleeds, (-) sinus trouble. Mouth and Throat: (-) sore throat, (-) hoarseness. Neck: (-) swollen glands, (-) enlarged thyroid, (-) neck pain. Respiratory: - cough, (-) bloody sputum, - shortness of breath, - wheezing. Cardiovascular: - ankle swelling, (-) chest pain. Lymphatic: (-) lymph node enlargement. Neurologic: (-) numbness, (-) tingling. Psychiatric: (-) anxiety, (-) depression   Current Medication: Outpatient Encounter Medications as of 06/17/2017  Medication Sig  . acetaminophen (TYLENOL) 500 MG tablet Take 500 mg by mouth every 8 (eight) hours as needed for mild pain or moderate pain.   Marland Kitchen  albuterol (PROVENTIL HFA;VENTOLIN HFA) 108 (90 Base) MCG/ACT inhaler Inhale 2 puffs into the lungs every 6 (six) hours as needed for wheezing or shortness of breath.  . Ascorbic Acid (VITAMIN C) 1000 MG tablet Take 1,000 mg by mouth daily.  Marland Kitchen aspirin 81 MG tablet Take 81 mg by mouth daily.   . budesonide-formoterol (SYMBICORT) 160-4.5 MCG/ACT inhaler Inhale 2 puffs into the lungs 2 (two) times daily.   . Calcium Carb-Cholecalciferol (CALCIUM 600+D3 PO) Take 1 tablet by mouth daily.  . clopidogrel (PLAVIX) 75 MG tablet Take 1 tablet (75 mg total) by mouth daily.  Marland Kitchen docusate sodium (COLACE) 100 MG capsule Take 100 mg by mouth at bedtime.  . metoprolol tartrate (LOPRESSOR) 25 MG tablet Take 1 tablet (25 mg total) by mouth 2 (two) times daily.  . MULTIPLE VITAMIN PO Take 1 tablet by mouth daily.   . pantoprazole (PROTONIX) 40 MG tablet Take 40 mg by mouth 2 (two) times daily.  . simvastatin (ZOCOR) 40 MG tablet Take 1 tablet (40 mg total) by mouth at bedtime.   No facility-administered encounter medications on file as of 06/17/2017.     Surgical History: Past Surgical History:  Procedure Laterality Date  . ABDOMINAL HYSTERECTOMY  1980   due to dysfunctional uterine bleeding  . APPENDECTOMY  1980  . BREAST SURGERY Left 2000   biopsy  . CARDIAC CATHETERIZATION    . COLONOSCOPY    . CORONARY ARTERY BYPASS GRAFT  2006  . HEMORRHOID SURGERY    . LOWER EXTREMITY ANGIOGRAPHY Right  02/25/2017   Procedure: LOWER EXTREMITY ANGIOGRAPHY;  Surgeon: Algernon Huxley, MD;  Location: [facility];  Service: Cardiovascular;  Laterality: Right;  . LOWER EXTREMITY INTERVENTION  02/25/2017   Procedure: LOWER EXTREMITY INTERVENTION;  Surgeon: Algernon Huxley, MD;  Location: [facility];  Service: Cardiovascular;;  . LUNG BIOPSY  1999   Negative  . TONSILLECTOMY      Medical History: Past Medical History:  Diagnosis Date  . Arthritis   . COPD (chronic obstructive pulmonary disease) ([place])   .  Coronary artery disease   . GERD (gastroesophageal reflux disease)   . Hyperlipidemia   . Hypertension   . Macular degeneration   . PAD (peripheral artery disease) ([place])   . Sleep apnea     Family History: Family History  Problem Relation Age of Onset  . Hypertension Mother   . Hyperlipidemia Mother   . Alzheimer's disease Mother   . CAD Mother   . Heart attack Father   . Lung disease Sister   . Heart disease Sister   . Diabetes Paternal Grandfather        Type 2  . Hearing loss Son     Social History: Social History   Socioeconomic History  . Marital status: Married    Spouse name: Cliffton Mathis Bud  . Number of children: 2  . Years of education: 45  . Highest education level: 12th grade  Occupational History  . Occupation: retired  Scientific laboratory technician  . Financial resource strain: Not hard at all  . Food insecurity:    Worry: Never true    Inability: Never true  . Transportation needs:    Medical: No    Non-medical: No  Tobacco Use  . Smoking status: Former Smoker    Packs/day: 0.50    Years: 5.00    Pack years: 2.50    Last attempt to quit: 04/24/2004    Years since quitting: 13.1  . Smokeless tobacco: Never Used  Substance and Sexual Activity  . Alcohol use: No  . Drug use: No  . Sexual activity: Yes  Lifestyle  . Physical activity:    Days per week: Not on file    Minutes per session: Not on file  . Stress: Only a little  Relationships  . Social connections:    Talks on phone: Not on file    Gets together: Not on file    Attends religious service: Not on file    Active member of club or organization: Not on file    Attends meetings of clubs or organizations: Not on file    Relationship status: Not on file  . Intimate partner violence:    Fear of current or ex partner: No    Emotionally abused: No    Physically abused: No    Forced sexual activity: No  Other Topics Concern  . Not on file  Social History Narrative  . Not on file    Vital  Signs: Blood pressure (!) 173/76, pulse 65, resp. rate 16, height 5\' 5"  (1.651 m), weight 177 lb (80.3 kg), SpO2 96 %.  Examination: General Appearance: The patient is well-developed, well-nourished, and in no distress. Skin: Gross inspection of skin unremarkable. Head: normocephalic, no gross deformities. Eyes: no gross deformities noted. ENT: ears appear grossly normal no exudates. Neck: Supple. No thyromegaly. No LAD. Respiratory: no rhonchi noted at this time. Cardiovascular: Normal S1 and S2 without murmur or rub. Extremities: No cyanosis. pulses are equal. Neurologic:  Alert and oriented. No involuntary movements.  LABS: No results found for this or any previous visit (from the past 2160 hour(s)).  Radiology: No results found.  No results found.  No results found.    Assessment and Plan: Patient Active Problem List   Diagnosis Date Noted  . Aortic atherosclerosis ([place]) 10/26/2016  . Claudication ([place]) 10/26/2016  . COPD (chronic obstructive pulmonary disease) ([place]) 10/22/2016  . OSA on CPAP 09/16/2015  . Abnormal chest x-ray 01/25/2015  . Atrophic vaginitis 01/25/2015  . Atherosclerosis of coronary artery 01/25/2015  . Chronic kidney disease (CKD), stage III (moderate) ([place]) 01/25/2015  . Bloodgood disease 01/25/2015  . Blood glucose elevated 01/25/2015  . Hyperlipidemia 01/25/2015  . Cannot sleep 01/25/2015  . Hypertension 11/02/2014  . Acid reflux 11/02/2014  . Arthritis 10/19/2014  . Adenomatous colon polyp 07/27/2013  . Gastric catarrh 07/27/2013  . Bergmann's syndrome 07/27/2013  . Barrett esophagus 07/07/2013  . Osteopenia 12/13/2009  . PAD (peripheral artery disease) ([place]) 09/24/2007  . DD (diverticular disease) 12/19/2005    1. COPD She is doing well with her breathing she had a bit of flare back in February and was given a zpk by primary care. Patient states that she is now doing a little better. Patient will continue with present management has  FEV1 range in the 75% range 2. CKD III last creatinine was stable. She staets she does have excessive urination at times 3. PAD followed by Vascular. She is to see Dr Lucky Cowboy in the morning 4. OSA on CPAP she states that she is using the CPAP but overall is not sleeping well due to her issues with the legs and pain in the legs. In addition she states that she has other issues that she is preoccupied with. Discussed with her sleep hygiene this will serve as a face-to-face evaluation for ongoing need of her CPAP therapy 5. GERD stable will continue with present therapy  General Counseling: I have discussed the findings of the evaluation and examination with Mardene Celeste.  I have also discussed any further diagnostic evaluation thatmay be needed or ordered today. Laray verbalizes understanding of the findings of todays visit. We also reviewed her medications today and discussed drug interactions and side effects including but not limited excessive drowsiness and altered mental states. We also discussed that there is always a risk not just to her but also people around her. she has been encouraged to call the office with any questions or concerns that should arise related to todays visit.    Time spent: 36min  I have personally obtained a history, examined the patient, evaluated laboratory and imaging results, formulated the assessment and plan and placed orders.    Allyne Gee, MD [facility] Pulmonary and Critical Care Sleep medicine

## 2017-06-18 ENCOUNTER — Encounter (INDEPENDENT_AMBULATORY_CARE_PROVIDER_SITE_OTHER): Payer: Self-pay | Admitting: Vascular Surgery

## 2017-06-18 ENCOUNTER — Ambulatory Visit (INDEPENDENT_AMBULATORY_CARE_PROVIDER_SITE_OTHER): Payer: Medicare Other | Admitting: Vascular Surgery

## 2017-06-18 VITALS — BP 152/71 | HR 75 | Resp 15 | Ht 66.0 in | Wt 175.0 lb

## 2017-06-18 DIAGNOSIS — I10 Essential (primary) hypertension: Secondary | ICD-10-CM | POA: Diagnosis not present

## 2017-06-18 DIAGNOSIS — I739 Peripheral vascular disease, unspecified: Secondary | ICD-10-CM

## 2017-06-18 DIAGNOSIS — E7800 Pure hypercholesterolemia, unspecified: Secondary | ICD-10-CM | POA: Diagnosis not present

## 2017-06-18 NOTE — Progress Notes (Signed)
MRN : [id number]  Christina [name] is a 82 y.o. (12-Feb-1935) female who presents with chief complaint of  Chief Complaint  Patient presents with  . Follow-up    PAD, ankle swelling  .  History of Present Illness: Patient returns today in follow up of her PAD.  She is frustrated with her continued swelling in her legs.  She cannot tolerate compression stockings.  She has been elevating her legs somewhat.  She has decreased her activity significantly.  She was seen with noninvasive studies several weeks ago which showed normal flow after extensive iliac intervention more on the right than the left.  Current Outpatient Medications  Medication Sig Dispense Refill  . acetaminophen (TYLENOL) 500 MG tablet Take 500 mg by mouth every 8 (eight) hours as needed for mild pain or moderate pain.     Marland Kitchen albuterol (PROVENTIL HFA;VENTOLIN HFA) 108 (90 Base) MCG/ACT inhaler Inhale 2 puffs into the lungs every 6 (six) hours as needed for wheezing or shortness of breath. 1 Inhaler 5  . Ascorbic Acid (VITAMIN C) 1000 MG tablet Take 1,000 mg by mouth daily.    Marland Kitchen aspirin 81 MG tablet Take 81 mg by mouth daily.     . budesonide-formoterol (SYMBICORT) 160-4.5 MCG/ACT inhaler Inhale 2 puffs into the lungs 2 (two) times daily.     . Calcium Carb-Cholecalciferol (CALCIUM 600+D3 PO) Take 1 tablet by mouth daily.    Marland Kitchen docusate sodium (COLACE) 100 MG capsule Take 100 mg by mouth at bedtime.    . metoprolol tartrate (LOPRESSOR) 25 MG tablet Take 1 tablet (25 mg total) by mouth 2 (two) times daily. 180 tablet 1  . MULTIPLE VITAMIN PO Take 1 tablet by mouth daily.     . pantoprazole (PROTONIX) 40 MG tablet Take 40 mg by mouth 2 (two) times daily.    . simvastatin (ZOCOR) 40 MG tablet Take 1 tablet (40 mg total) by mouth at bedtime. 90 tablet 3  . tobramycin (TOBREX) 0.3 % ophthalmic solution INSTILL 1 DROP IN LEFT EYE 4 TIMES/DAY BEGIN 1 DAY BEFORE TREATMENT DAY OF TREATMENT AND 1 DAY AFTER  5  . clopidogrel (PLAVIX)  75 MG tablet Take 1 tablet (75 mg total) by mouth daily. (Patient not taking: Reported on 06/18/2017) 30 tablet 11   No current facility-administered medications for this visit.     Past Medical History:  Diagnosis Date  . Arthritis   . COPD (chronic obstructive pulmonary disease) ([place])   . Coronary artery disease   . GERD (gastroesophageal reflux disease)   . Hyperlipidemia   . Hypertension   . Macular degeneration   . PAD (peripheral artery disease) ([place])   . Sleep apnea     Past Surgical History:  Procedure Laterality Date  . ABDOMINAL HYSTERECTOMY  1980   due to dysfunctional uterine bleeding  . APPENDECTOMY  1980  . BREAST SURGERY Left 2000   biopsy  . CARDIAC CATHETERIZATION    . COLONOSCOPY    . CORONARY ARTERY BYPASS GRAFT  2006  . HEMORRHOID SURGERY    . LOWER EXTREMITY ANGIOGRAPHY Right 02/25/2017   Procedure: LOWER EXTREMITY ANGIOGRAPHY;  Surgeon: Algernon Huxley, MD;  Location: [facility];  Service: Cardiovascular;  Laterality: Right;  . LOWER EXTREMITY INTERVENTION  02/25/2017   Procedure: LOWER EXTREMITY INTERVENTION;  Surgeon: Algernon Huxley, MD;  Location: [facility];  Service: Cardiovascular;;  . LUNG BIOPSY  1999   Negative  . TONSILLECTOMY  Social History Social History   Tobacco Use  . Smoking status: Former Smoker    Packs/day: 0.50    Years: 5.00    Pack years: 2.50    Last attempt to quit: 04/24/2004    Years since quitting: 13.1  . Smokeless tobacco: Never Used  Substance Use Topics  . Alcohol use: No  . Drug use: No      Family History Family History  Problem Relation Age of Onset  . Hypertension Mother   . Hyperlipidemia Mother   . Alzheimer's disease Mother   . CAD Mother   . Heart attack Father   . Lung disease Sister   . Heart disease Sister   . Diabetes Paternal Grandfather        Type 2  . Hearing loss Son      Allergies  Allergen Reactions  . Baclofen Other (See Comments)  . Codeine Nausea Only    . Plavix [Clopidogrel]     brusing all over  . Sulfa Antibiotics Nausea Only  . Latex Rash    REVIEW OF SYSTEMS (Negative unless checked)  Constitutional: [] Weight loss  [] Fever  [] Chills Cardiac: [] Chest pain   [] Chest pressure   [] Palpitations   [] Shortness of breath when laying flat   [] Shortness of breath at rest   [] Shortness of breath with exertion. Vascular:  [x] Pain in legs with walking   [] Pain in legs at rest   [] Pain in legs when laying flat   [x] Claudication   [] Pain in feet when walking  [] Pain in feet at rest  [] Pain in feet when laying flat   [] History of DVT   [] Phlebitis   [x] Swelling in legs   [] Varicose veins   [] Non-healing ulcers Pulmonary:   [] Uses home oxygen   [] Productive cough   [] Hemoptysis   [] Wheeze  [] COPD   [] Asthma Neurologic:  [] Dizziness  [] Blackouts   [] Seizures   [] History of stroke   [] History of TIA  [] Aphasia   [] Temporary blindness   [] Dysphagia   [] Weakness or numbness in arms   [] Weakness or numbness in legs Musculoskeletal:  [x] Arthritis   [] Joint swelling   [] Joint pain   [x] Low back pain Hematologic:  [] Easy bruising  [] Easy bleeding   [] Hypercoagulable state   [] Anemic  [] Hepatitis Gastrointestinal:  [] Blood in stool   [] Vomiting blood  [] Gastroesophageal reflux/heartburn   [] Abdominal pain Genitourinary:  [] Chronic kidney disease   [] Difficult urination  [] Frequent urination  [] Burning with urination   [] Hematuria Skin:  [] Rashes   [] Ulcers   [] Wounds Psychological:  [] History of anxiety   []  History of major depression.     Physical Examination  BP (!) 152/71 (BP Location: Left Arm, Patient Position: Sitting)   Pulse 75   Resp 15   Ht 5\' 6"  (1.676 m)   Wt 175 lb (79.4 kg)   BMI 28.25 kg/m  Gen:  WD/WN, NAD Head: [place]/AT, No temporalis wasting. Ear/Nose/Throat: Hearing grossly intact, nares w/o erythema or drainage Eyes: Conjunctiva clear. Sclera non-icteric Neck: Supple.  Trachea midline Pulmonary:  Good air movement, no use of  accessory muscles.  Cardiac: RRR, no JVD Vascular:  Vessel Right Left  Radial Palpable Palpable                          PT 1+ Palpable Palpable  DP Palpable Palpable    Musculoskeletal: M/S 5/5 throughout.  No deformity or atrophy. 1+ RLE edema, trace LLE edema. Neurologic: Sensation grossly intact in  extremities.  Symmetrical.  Speech is fluent.  Psychiatric: Judgment intact, Mood & affect appropriate for pt's clinical situation. Dermatologic: No rashes or ulcers noted.  No cellulitis or open wounds.       Labs No results found for this or any previous visit (from the past 2160 hour(s)).  Radiology No results found.  Assessment/Plan Hypertension blood pressure control important in reducing the progression of atherosclerotic disease. On appropriate oral medications.   Atherosclerosis of coronary artery S/p CABG  HLD (hyperlipidemia) lipid control important in reducing the progression of atherosclerotic disease. Continue statin therapy    PAD (peripheral artery disease) ([place]) Her perfusion has been restored.  She needs to increase her activity and should try to wear some sort of compression stockings to reduce her reperfusion swelling.  She should also elevate her legs.  She should continue her current medical regimen including aspirin, Plavix, and Zocor.  We will plan on rechecking her perfusion in several months.    Leotis Pain, MD  06/19/2017 12:32 PM    This note was created with Dragon medical transcription system.  Any errors from dictation are purely unintentional

## 2017-06-19 NOTE — Assessment & Plan Note (Signed)
Her perfusion has been restored.  She needs to increase her activity and should try to wear some sort of compression stockings to reduce her reperfusion swelling.  She should also elevate her legs.  She should continue her current medical regimen including aspirin, Plavix, and Zocor.  We will plan on rechecking her perfusion in several months.

## 2017-06-27 ENCOUNTER — Other Ambulatory Visit: Payer: Self-pay | Admitting: Internal Medicine

## 2017-10-22 ENCOUNTER — Other Ambulatory Visit: Payer: Self-pay | Admitting: Family Medicine

## 2017-10-22 DIAGNOSIS — I10 Essential (primary) hypertension: Secondary | ICD-10-CM

## 2017-10-22 NOTE — Telephone Encounter (Signed)
Will forward to PCP 

## 2017-12-11 ENCOUNTER — Ambulatory Visit (INDEPENDENT_AMBULATORY_CARE_PROVIDER_SITE_OTHER): Payer: Medicare Other

## 2017-12-11 DIAGNOSIS — Z9989 Dependence on other enabling machines and devices: Secondary | ICD-10-CM | POA: Diagnosis not present

## 2017-12-11 DIAGNOSIS — G4733 Obstructive sleep apnea (adult) (pediatric): Secondary | ICD-10-CM

## 2017-12-11 NOTE — Progress Notes (Signed)
95 percentile pressure 12   95th percentile leak 50.1   apnea index 1.5 /hr  apnea-hypopnea index  3.2 /hr   total days used  >4 hr 88 days  total days used <4 hr 2 days  Total compliance 98 percent  No problems or questions at this time

## 2017-12-19 ENCOUNTER — Ambulatory Visit: Payer: Medicare Other | Admitting: Internal Medicine

## 2017-12-19 ENCOUNTER — Encounter: Payer: Self-pay | Admitting: Internal Medicine

## 2017-12-19 VITALS — BP 132/76 | HR 65 | Resp 16 | Ht 66.0 in | Wt 176.0 lb

## 2017-12-19 DIAGNOSIS — R0602 Shortness of breath: Secondary | ICD-10-CM | POA: Diagnosis not present

## 2017-12-19 DIAGNOSIS — Z9989 Dependence on other enabling machines and devices: Secondary | ICD-10-CM

## 2017-12-19 DIAGNOSIS — G4733 Obstructive sleep apnea (adult) (pediatric): Secondary | ICD-10-CM

## 2017-12-19 DIAGNOSIS — J449 Chronic obstructive pulmonary disease, unspecified: Secondary | ICD-10-CM | POA: Diagnosis not present

## 2017-12-19 DIAGNOSIS — K219 Gastro-esophageal reflux disease without esophagitis: Secondary | ICD-10-CM | POA: Diagnosis not present

## 2017-12-19 IMAGING — CR DG CHEST 2V
1 series · 2 of 2 positions shown · non-contrast
Comparison: 12/24/2011

CLINICAL DATA: Cough, sob, and wheezing since 01-26-15; cardiac
surgery 1281; nonsmoker; hx htn

EXAM:
CHEST  2 VIEW

[Series 1: w chest pa · 0.14mm/px · 2 of 2 slices shown]
[im 1/2]
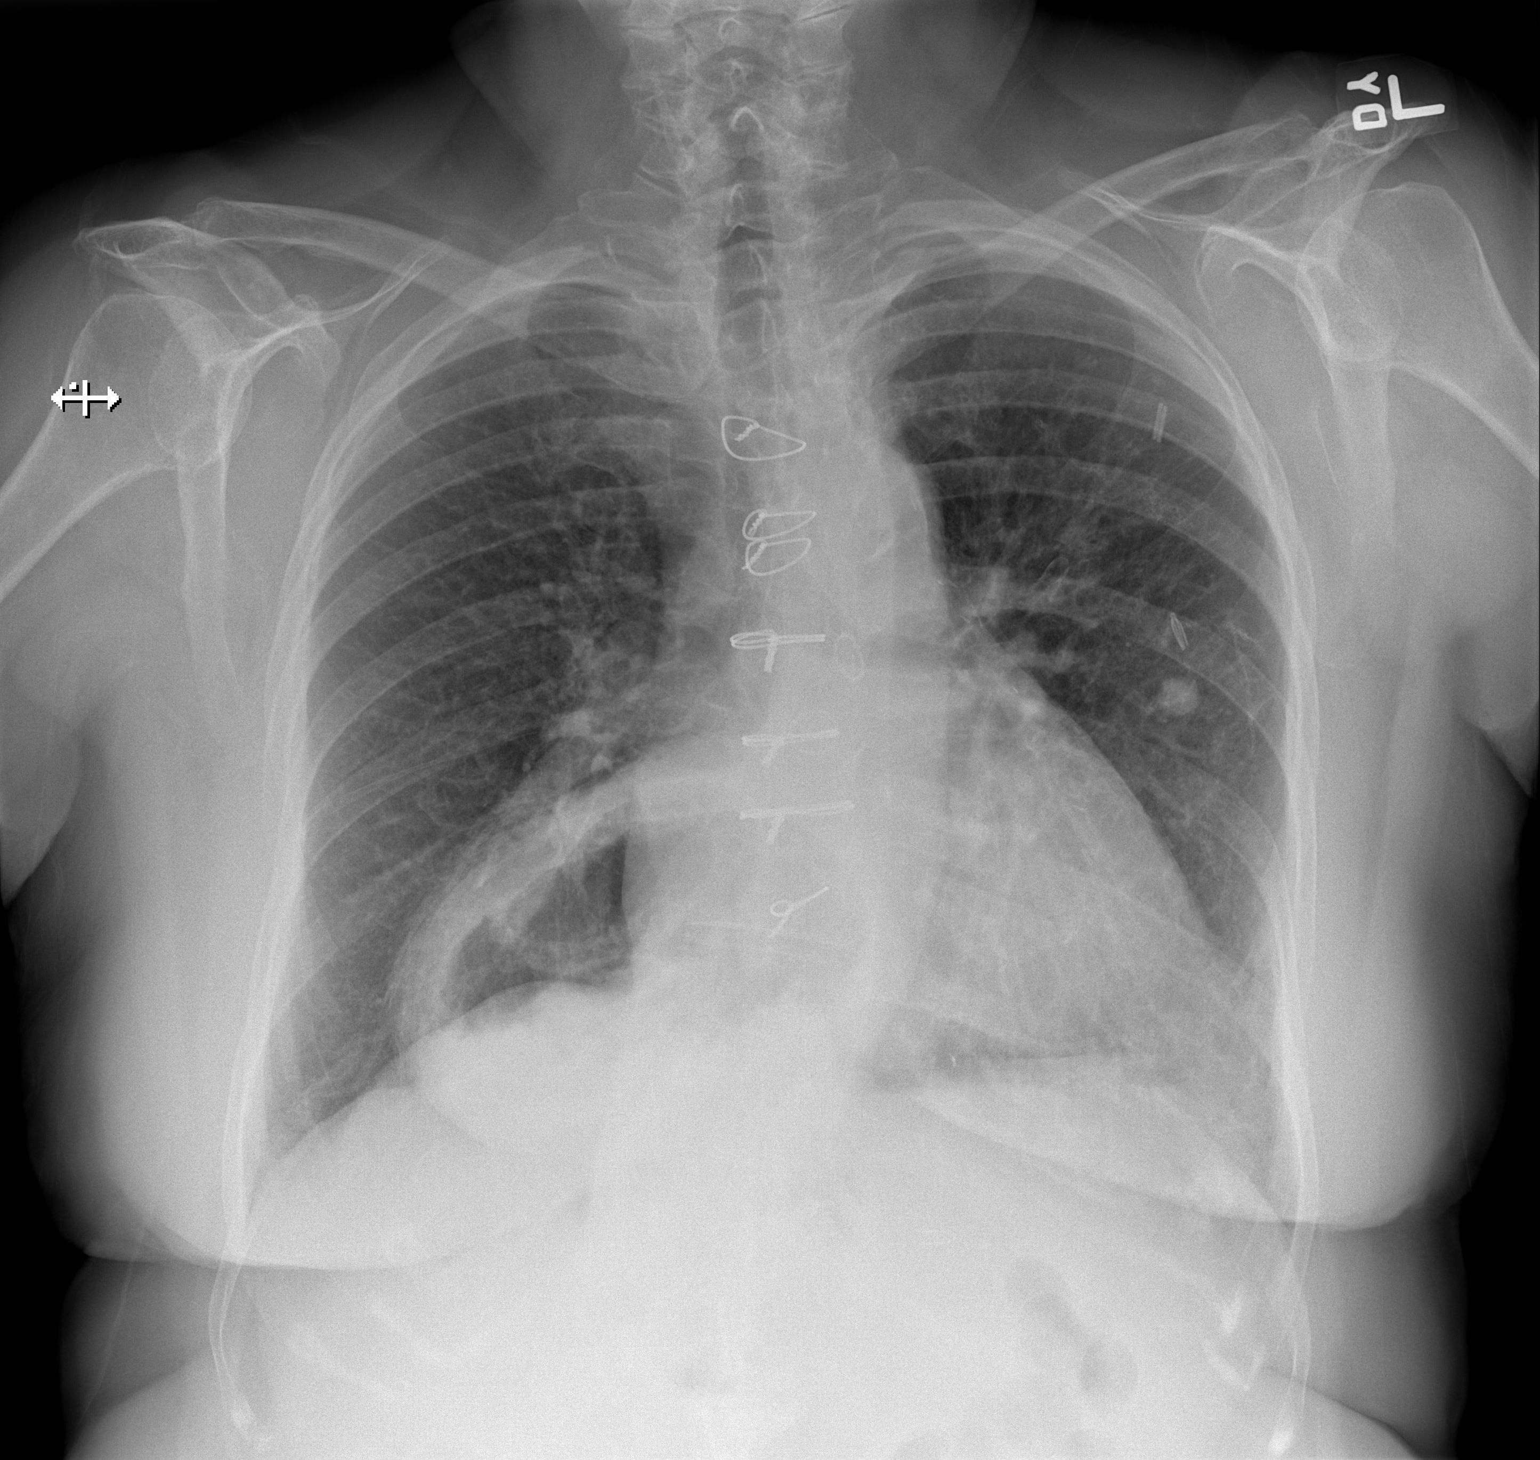
[im 2/2]
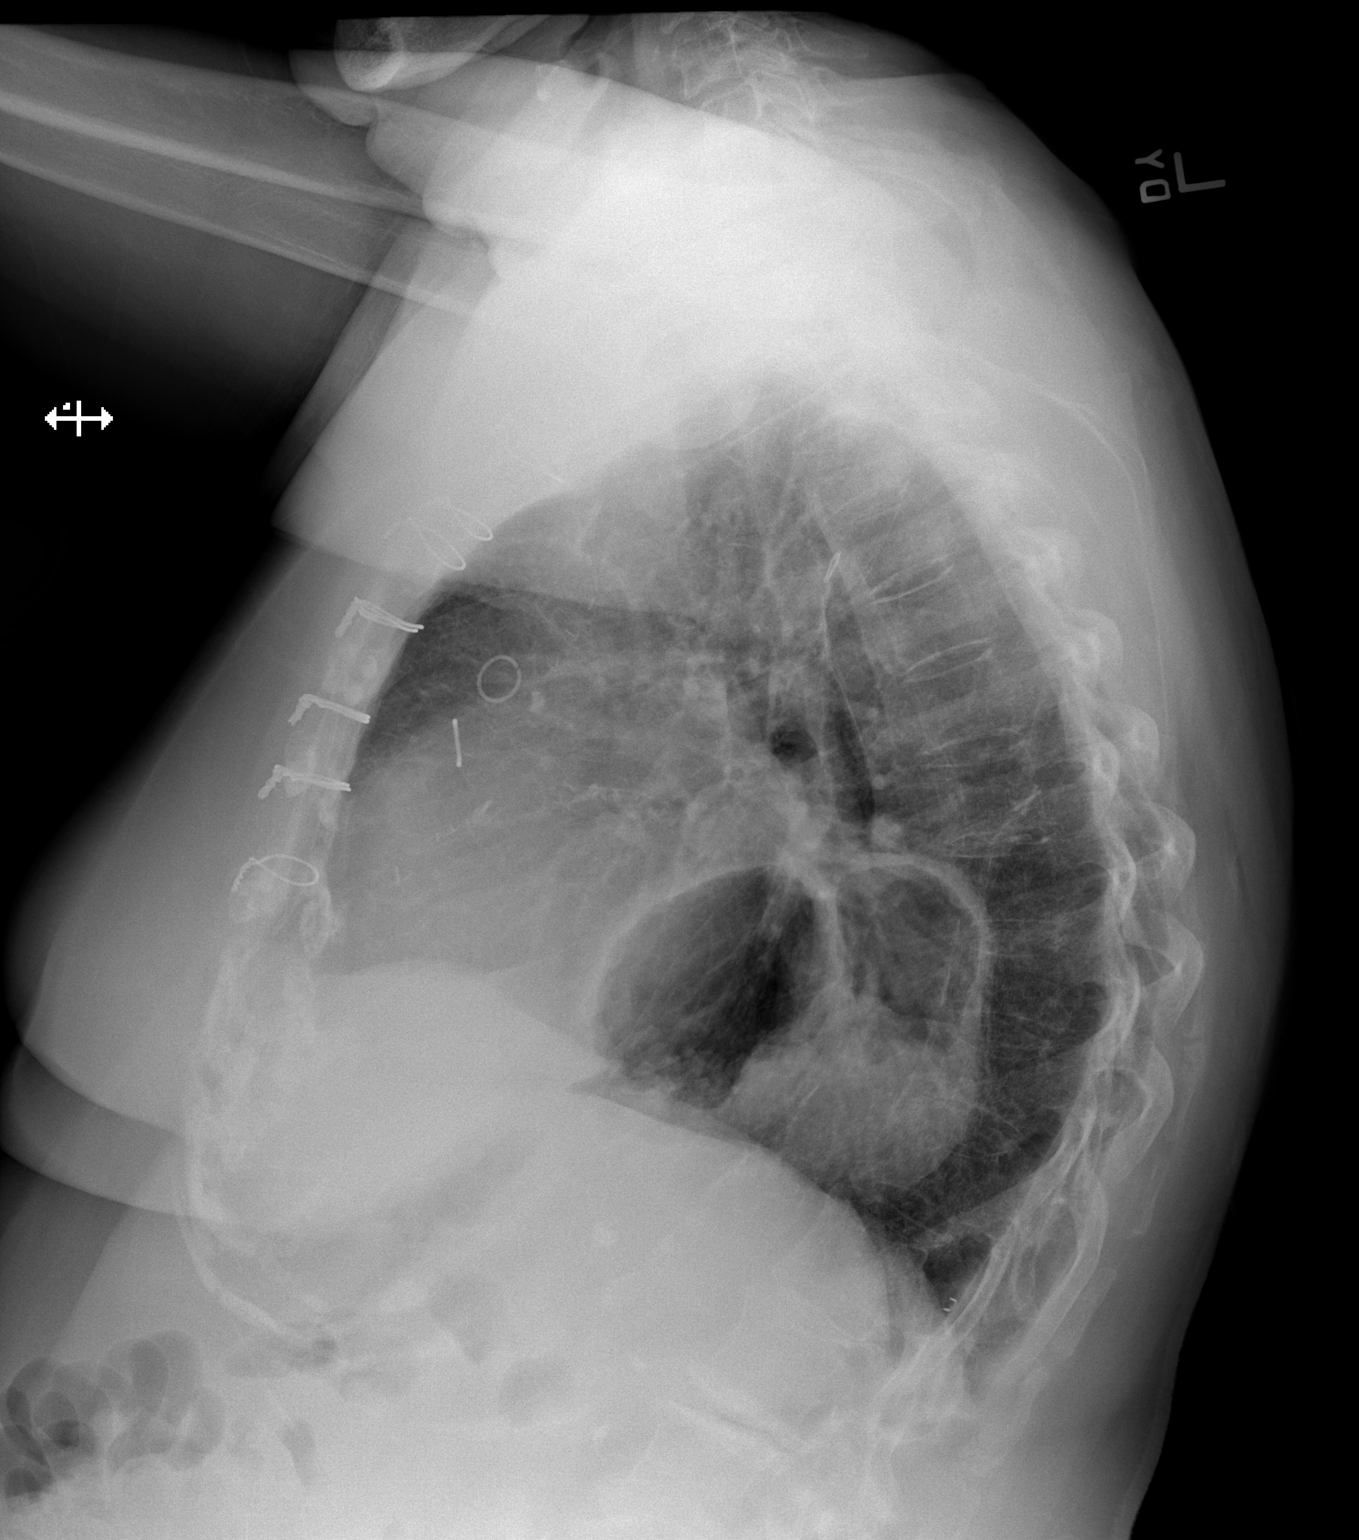

[2 of 2 positions shown; findings below may reference images not displayed]

FINDINGS: Prior median sternotomy. Lateral view degraded by patient arm
position. Midline trachea. Mild cardiomegaly. Large hiatal hernia,
with the majority of the stomach positioned within the lower right
hemi thorax. This is unchanged. No pleural effusion or pneumothorax.
Superior segment left lower lobe calcified lung nodule is unchanged,
most consistent with a granuloma. No congestive failure.
IMPRESSION: No acute cardiopulmonary disease.

Cardiomegaly without congestive failure.

Large hiatal hernia.

## 2017-12-19 NOTE — Progress Notes (Signed)
[facility] [place], [place] [address]  Pulmonary Sleep Medicine   Office Visit Note  Patient Name: Christina [name] DOB: 1935-12-10 MRN [id number]  Date of Service: 12/19/2017  Complaints/HPI: Pt here for follow up on COPD, and OSA.  She reports her breathing has been dong well.  She denies chest pain, overt SOB, hemoptysis or sinus issues.  She is wearing her CPAP nightly.  She shows a 98% compliance at last download.  She had 88 days with greater than 4 hours of use, and only 2 days with less than 4 hours of use.  She is changing her tubing and seal as directed.   ROS  General: (-) fever, (-) chills, (-) night sweats, (-) weakness Skin: (-) rashes, (-) itching,. Eyes: (-) visual changes, (-) redness, (-) itching. Nose and Sinuses: (-) nasal stuffiness or itchiness, (-) postnasal drip, (-) nosebleeds, (-) sinus trouble. Mouth and Throat: (-) sore throat, (-) hoarseness. Neck: (-) swollen glands, (-) enlarged thyroid, (-) neck pain. Respiratory: - cough, (-) bloody sputum, - shortness of breath, - wheezing. Cardiovascular: - ankle swelling, (-) chest pain. Lymphatic: (-) lymph node enlargement. Neurologic: (-) numbness, (-) tingling. Psychiatric: (-) anxiety, (-) depression   Current Medication: Outpatient Encounter Medications as of 12/19/2017  Medication Sig  . acetaminophen (TYLENOL) 500 MG tablet Take 500 mg by mouth every 8 (eight) hours as needed for mild pain or moderate pain.   Marland Kitchen albuterol (PROVENTIL HFA;VENTOLIN HFA) 108 (90 Base) MCG/ACT inhaler Inhale 2 puffs into the lungs every 6 (six) hours as needed for wheezing or shortness of breath.  . Ascorbic Acid (VITAMIN C) 1000 MG tablet Take 1,000 mg by mouth daily.  Marland Kitchen aspirin 81 MG tablet Take 81 mg by mouth daily.   . Calcium Carb-Cholecalciferol (CALCIUM 600+D3 PO) Take 1 tablet by mouth daily.  Marland Kitchen docusate sodium (COLACE) 100 MG capsule Take 100 mg by mouth at bedtime.  . metoprolol  tartrate (LOPRESSOR) 25 MG tablet TAKE 1 TABLET BY MOUTH TWICE A DAY  . MULTIPLE VITAMIN PO Take 1 tablet by mouth daily.   . pantoprazole (PROTONIX) 40 MG tablet Take 40 mg by mouth 2 (two) times daily.  . ranitidine (ZANTAC) 300 MG tablet Take by mouth.  . simvastatin (ZOCOR) 40 MG tablet Take 1 tablet (40 mg total) by mouth at bedtime.  . SYMBICORT 160-4.5 MCG/ACT inhaler INHALE 1 PUFF TWICE A DAY  . tobramycin (TOBREX) 0.3 % ophthalmic solution INSTILL 1 DROP IN LEFT EYE 4 TIMES/DAY BEGIN 1 DAY BEFORE TREATMENT DAY OF TREATMENT AND 1 DAY AFTER  . [DISCONTINUED] clopidogrel (PLAVIX) 75 MG tablet Take 1 tablet (75 mg total) by mouth daily. (Patient not taking: Reported on 06/18/2017)   No facility-administered encounter medications on file as of 12/19/2017.     Surgical History: Past Surgical History:  Procedure Laterality Date  . ABDOMINAL HYSTERECTOMY  1980   due to dysfunctional uterine bleeding  . APPENDECTOMY  1980  . BREAST SURGERY Left 2000   biopsy  . CARDIAC CATHETERIZATION    . COLONOSCOPY    . CORONARY ARTERY BYPASS GRAFT  2006  . HEMORRHOID SURGERY    . LOWER EXTREMITY ANGIOGRAPHY Right 02/25/2017   Procedure: LOWER EXTREMITY ANGIOGRAPHY;  Surgeon: Algernon Huxley, MD;  Location: [facility];  Service: Cardiovascular;  Laterality: Right;  . LOWER EXTREMITY INTERVENTION  02/25/2017   Procedure: LOWER EXTREMITY INTERVENTION;  Surgeon: Algernon Huxley, MD;  Location: [facility];  Service: Cardiovascular;;  . LUNG BIOPSY  1999   Negative  . TONSILLECTOMY      Medical History: Past Medical History:  Diagnosis Date  . Arthritis   . COPD (chronic obstructive pulmonary disease) ([place])   . Coronary artery disease   . GERD (gastroesophageal reflux disease)   . Hyperlipidemia   . Hypertension   . Macular degeneration   . PAD (peripheral artery disease) ([place])   . Sleep apnea     Family History: Family History  Problem Relation Age of Onset  . Hypertension  Mother   . Hyperlipidemia Mother   . Alzheimer's disease Mother   . CAD Mother   . Heart attack Father   . Lung disease Sister   . Heart disease Sister   . Diabetes Paternal Grandfather        Type 2  . Hearing loss Son     Social History: Social History   Socioeconomic History  . Marital status: Married    Spouse name: Cliffton Mathis Bud  . Number of children: 2  . Years of education: 42  . Highest education level: 12th grade  Occupational History  . Occupation: retired  Scientific laboratory technician  . Financial resource strain: Not hard at all  . Food insecurity:    Worry: Never true    Inability: Never true  . Transportation needs:    Medical: No    Non-medical: No  Tobacco Use  . Smoking status: Former Smoker    Packs/day: 0.50    Years: 5.00    Pack years: 2.50    Last attempt to quit: 04/24/2004    Years since quitting: 13.6  . Smokeless tobacco: Never Used  Substance and Sexual Activity  . Alcohol use: No  . Drug use: No  . Sexual activity: Yes  Lifestyle  . Physical activity:    Days per week: Not on file    Minutes per session: Not on file  . Stress: Only a little  Relationships  . Social connections:    Talks on phone: Not on file    Gets together: Not on file    Attends religious service: Not on file    Active member of club or organization: Not on file    Attends meetings of clubs or organizations: Not on file    Relationship status: Not on file  . Intimate partner violence:    Fear of current or ex partner: No    Emotionally abused: No    Physically abused: No    Forced sexual activity: No  Other Topics Concern  . Not on file  Social History Narrative  . Not on file    Vital Signs: Blood pressure 132/76, pulse 65, resp. rate 16, height 5\' 6"  (1.676 m), weight 176 lb (79.8 kg), SpO2 97 %.  Examination: General Appearance: The patient is well-developed, well-nourished, and in no distress. Skin: Gross inspection of skin unremarkable. Head:  normocephalic, no gross deformities. Eyes: no gross deformities noted. ENT: ears appear grossly normal no exudates. Neck: Supple. No thyromegaly. No LAD. Respiratory: clear to auscultation bilateraly. Cardiovascular: Normal S1 and S2 without murmur or rub. Extremities: No cyanosis. pulses are equal. Neurologic: Alert and oriented. No involuntary movements.  LABS: No results found for this or any previous visit (from the past 2160 hour(s)).  Radiology: No results found.  No results found.  No results found.    Assessment and Plan: Patient Active Problem List   Diagnosis Date Noted  . Aortic atherosclerosis (  [place]) 10/26/2016  . Claudication ([place]) 10/26/2016  . COPD (chronic obstructive pulmonary disease) ([place]) 10/22/2016  . OSA on CPAP 09/16/2015  . Abnormal chest x-ray 01/25/2015  . Atrophic vaginitis 01/25/2015  . Atherosclerosis of coronary artery 01/25/2015  . Chronic kidney disease (CKD), stage III (moderate) ([place]) 01/25/2015  . Bloodgood disease 01/25/2015  . Blood glucose elevated 01/25/2015  . Hyperlipidemia 01/25/2015  . Cannot sleep 01/25/2015  . Hypertension 11/02/2014  . Acid reflux 11/02/2014  . Arthritis 10/19/2014  . Adenomatous colon polyp 07/27/2013  . Gastric catarrh 07/27/2013  . Bergmann's syndrome 07/27/2013  . Barrett esophagus 07/07/2013  . Osteopenia 12/13/2009  . PAD (peripheral artery disease) ([place]) 09/24/2007  . DD (diverticular disease) 12/19/2005    1. Chronic obstructive pulmonary disease, unspecified COPD type ([place]) Stable, continue using inhalers as directed.    2. OSA on CPAP Encouraged continued CPAP compliance as well as cleaning, and changing of tubing and seal.  3. Gastroesophageal reflux disease without esophagitis Stable, continue current therapy.  4. SOB (shortness of breath) Pt FEV!/FVC 112% of pre predicted value.  - Spirometry with Graph  General Counseling: I have discussed the findings of the evaluation and  examination with Mardene Celeste.  I have also discussed any further diagnostic evaluation thatmay be needed or ordered today. Lamiracle verbalizes understanding of the findings of todays visit. We also reviewed her medications today and discussed drug interactions and side effects including but not limited excessive drowsiness and altered mental states. We also discussed that there is always a risk not just to her but also people around her. she has been encouraged to call the office with any questions or concerns that should arise related to todays visit.    Time spent: 30 This patient was seen by Orson Gear AGNP-C in Collaboration with Dr. Devona Konig as a part of collaborative care agreement.   I have personally obtained a history, examined the patient, evaluated laboratory and imaging results, formulated the assessment and plan and placed orders.    Allyne Gee, MD [facility] Pulmonary and Critical Care Sleep medicine

## 2017-12-19 NOTE — Patient Instructions (Signed)

## 2017-12-27 ENCOUNTER — Encounter (INDEPENDENT_AMBULATORY_CARE_PROVIDER_SITE_OTHER): Payer: Medicare Other

## 2017-12-27 ENCOUNTER — Ambulatory Visit (INDEPENDENT_AMBULATORY_CARE_PROVIDER_SITE_OTHER): Payer: Medicare Other | Admitting: Vascular Surgery

## 2018-01-07 ENCOUNTER — Telehealth: Payer: Self-pay | Admitting: Physician Assistant

## 2018-01-07 NOTE — Telephone Encounter (Signed)
Pt wanting to know if she has ever had a pneumonia shot.   Please call her at her home [phone].  If no answer can leave a message.  Thanks, [employer]

## 2018-01-08 NOTE — Telephone Encounter (Signed)
lmtcb

## 2018-01-09 ENCOUNTER — Telehealth: Payer: Self-pay

## 2018-01-09 NOTE — Telephone Encounter (Signed)
Pt called back and said she has got a lot going on at this time and cannot schedule. Offered to call her back in January and pt declined and said she would CB. Note made. -MM

## 2018-01-09 NOTE — Telephone Encounter (Signed)
Called pt to scheudule AWV prior to CPE on 02/17/17. LMTCB, needs to be after 01/25/18. -MM

## 2018-01-23 LAB — HM MAMMOGRAPHY

## 2018-02-08 ENCOUNTER — Other Ambulatory Visit: Payer: Self-pay | Admitting: Physician Assistant

## 2018-02-08 DIAGNOSIS — E7800 Pure hypercholesterolemia, unspecified: Secondary | ICD-10-CM

## 2018-02-17 ENCOUNTER — Ambulatory Visit (INDEPENDENT_AMBULATORY_CARE_PROVIDER_SITE_OTHER): Payer: Medicare Other | Admitting: Physician Assistant

## 2018-02-17 ENCOUNTER — Encounter: Payer: Self-pay | Admitting: Physician Assistant

## 2018-02-17 VITALS — BP 144/74 | HR 68 | Temp 98.0°F | Resp 16 | Ht 66.0 in | Wt 172.0 lb

## 2018-02-17 DIAGNOSIS — Z0000 Encounter for general adult medical examination without abnormal findings: Secondary | ICD-10-CM | POA: Diagnosis not present

## 2018-02-17 DIAGNOSIS — I739 Peripheral vascular disease, unspecified: Secondary | ICD-10-CM | POA: Diagnosis not present

## 2018-02-17 DIAGNOSIS — J449 Chronic obstructive pulmonary disease, unspecified: Secondary | ICD-10-CM | POA: Diagnosis not present

## 2018-02-17 DIAGNOSIS — N183 Chronic kidney disease, stage 3 unspecified: Secondary | ICD-10-CM

## 2018-02-17 DIAGNOSIS — I700 Atherosclerosis of aorta: Secondary | ICD-10-CM

## 2018-02-17 DIAGNOSIS — E7800 Pure hypercholesterolemia, unspecified: Secondary | ICD-10-CM

## 2018-02-17 DIAGNOSIS — I10 Essential (primary) hypertension: Secondary | ICD-10-CM

## 2018-02-17 DIAGNOSIS — G479 Sleep disorder, unspecified: Secondary | ICD-10-CM

## 2018-02-17 DIAGNOSIS — R739 Hyperglycemia, unspecified: Secondary | ICD-10-CM

## 2018-02-17 NOTE — Patient Instructions (Signed)
Health Maintenance After Age 81 After age 81, you are at a higher risk for certain long-term diseases and infections as well as injuries from falls. Falls are a major cause of broken bones and head injuries in people who are older than age 81. Getting regular preventive care can help to keep you healthy and well. Preventive care includes getting regular testing and making lifestyle changes as recommended by your health care provider. Talk with your health care provider about:  Which screenings and tests you should have. A screening is a test that checks for a disease when you have no symptoms.  A diet and exercise plan that is right for you. What should I know about screenings and tests to prevent falls? Screening and testing are the best ways to find a health problem early. Early diagnosis and treatment give you the best chance of managing medical conditions that are common after age 81. Certain conditions and lifestyle choices may make you more likely to have a fall. Your health care provider may recommend:  Regular vision checks. Poor vision and conditions such as cataracts can make you more likely to have a fall. If you wear glasses, make sure to get your prescription updated if your vision changes.  Medicine review. Work with your health care provider to regularly review all of the medicines you are taking, including over-the-counter medicines. Ask your health care provider about any side effects that may make you more likely to have a fall. Tell your health care provider if any medicines that you take make you feel dizzy or sleepy.  Osteoporosis screening. Osteoporosis is a condition that causes the bones to get weaker. This can make the bones weak and cause them to break more easily.  Blood pressure screening. Blood pressure changes and medicines to control blood pressure can make you feel dizzy.  Strength and balance checks. Your health care provider may recommend certain tests to check your  strength and balance while standing, walking, or changing positions.  Foot health exam. Foot pain and numbness, as well as not wearing proper footwear, can make you more likely to have a fall.  Depression screening. You may be more likely to have a fall if you have a fear of falling, feel emotionally low, or feel unable to do activities that you used to do.  Alcohol use screening. Using too much alcohol can affect your balance and may make you more likely to have a fall. What actions can I take to lower my risk of falls? General instructions  Talk with your health care provider about your risks for falling. Tell your health care provider if: ? You fall. Be sure to tell your health care provider about all falls, even ones that seem minor. ? You feel dizzy, sleepy, or off-balance.  Take over-the-counter and prescription medicines only as told by your health care provider. These include any supplements.  Eat a healthy diet and maintain a healthy weight. A healthy diet includes low-fat dairy products, low-fat (lean) meats, and fiber from whole grains, beans, and lots of fruits and vegetables. Home safety  Remove any tripping hazards, such as rugs, cords, and clutter.  Install safety equipment such as grab bars in bathrooms and safety rails on stairs.  Keep rooms and walkways well-lit. Activity   Follow a regular exercise program to stay fit. This will help you maintain your balance. Ask your health care provider what types of exercise are appropriate for you.  If you need a cane or   walker, use it as recommended by your health care provider.  Wear supportive shoes that have nonskid soles. Lifestyle  Do not drink alcohol if your health care provider tells you not to drink.  If you drink alcohol, limit how much you have: ? 0-1 drink a day for women. ? 0-2 drinks a day for men.  Be aware of how much alcohol is in your drink. In the [place] one drink equals one typical bottle of beer (12  oz), one-half glass of wine (5 oz), or one shot of hard liquor (1 oz).  Do not use any products that contain nicotine or tobacco, such as cigarettes and e-cigarettes. If you need help quitting, ask your health care provider. Summary  Having a healthy lifestyle and getting preventive care can help to protect your health and wellness after age 81.  Screening and testing are the best way to find a health problem early and help you avoid having a fall. Early diagnosis and treatment give you the best chance for managing medical conditions that are more common for people who are older than age 81.  Falls are a major cause of broken bones and head injuries in people who are older than age 81. Take precautions to prevent a fall at home.  Work with your health care provider to learn what changes you can make to improve your health and wellness and to prevent falls. This information is not intended to replace advice given to you by your health care provider. Make sure you discuss any questions you have with your health care provider. Document Released: 12/05/2016 Document Revised: 12/05/2016 Document Reviewed: 12/05/2016 Elsevier Interactive Patient Education  2019 [employer]  

## 2018-02-17 NOTE — Progress Notes (Signed)
Patient: Christina [name], Female    DOB: 1935/10/05, 83 y.o.   MRN: [id number] Visit Date: 02/17/2018  Today's Provider: Mar Daring, PA-C   Chief Complaint  Patient presents with  . Medicare Wellness   Subjective:    Annual wellness visit Christina [name] is a 83 y.o. female. She feels well. She reports exercising none. She reports she is sleeping poorly. She is using melatonin. She has had some increased stress recently. Her daughter-in-law has recently (in Sept) been diagnosed with advanced breast cancer. She has completed chemo and is to have surgery in the coming weeks. She also reports her husband's eye sight has been worsening as well. She and her husband are celebrating their 45th wedding anniversary this weekend so she is excited about that.  -----------------------------------------------------------   Review of Systems  Constitutional: Positive for activity change and fatigue.  HENT: Positive for congestion, drooling, sneezing and voice change.   Eyes: Positive for photophobia and visual disturbance.  Respiratory: Positive for apnea and shortness of breath.   Cardiovascular: Positive for leg swelling.  Gastrointestinal: Negative.   Endocrine: Positive for polyuria.  Genitourinary: Negative.   Musculoskeletal: Positive for arthralgias and joint swelling.  Skin: Negative.   Allergic/Immunologic: Positive for environmental allergies.  Neurological: Negative.   Hematological: Bruises/bleeds easily.  Psychiatric/Behavioral: Positive for sleep disturbance.    Social History   Socioeconomic History  . Marital status: Married    Spouse name: Cliffton Mathis Bud  . Number of children: 2  . Years of education: 20  . Highest education level: 12th grade  Occupational History  . Occupation: retired  Scientific laboratory technician  . Financial resource strain: Not hard at all  . Food insecurity:    Worry: Never true    Inability: Never true  . Transportation needs:     Medical: No    Non-medical: No  Tobacco Use  . Smoking status: Former Smoker    Packs/day: 0.50    Years: 5.00    Pack years: 2.50    Last attempt to quit: 04/24/2004    Years since quitting: 13.8  . Smokeless tobacco: Never Used  Substance and Sexual Activity  . Alcohol use: No  . Drug use: No  . Sexual activity: Yes  Lifestyle  . Physical activity:    Days per week: Not on file    Minutes per session: Not on file  . Stress: Only a little  Relationships  . Social connections:    Talks on phone: Not on file    Gets together: Not on file    Attends religious service: Not on file    Active member of club or organization: Not on file    Attends meetings of clubs or organizations: Not on file    Relationship status: Not on file  . Intimate partner violence:    Fear of current or ex partner: No    Emotionally abused: No    Physically abused: No    Forced sexual activity: No  Other Topics Concern  . Not on file  Social History Narrative  . Not on file    Past Medical History:  Diagnosis Date  . Arthritis   . COPD (chronic obstructive pulmonary disease) ([place])   . Coronary artery disease   . GERD (gastroesophageal reflux disease)   . Hyperlipidemia   . Hypertension   . Macular degeneration   . PAD (peripheral artery disease) ([place])   . Sleep apnea  Patient Active Problem List   Diagnosis Date Noted  . Aortic atherosclerosis ([place]) 10/26/2016  . Claudication ([place]) 10/26/2016  . COPD (chronic obstructive pulmonary disease) ([place]) 10/22/2016  . OSA on CPAP 09/16/2015  . Abnormal chest x-ray 01/25/2015  . Atrophic vaginitis 01/25/2015  . Atherosclerosis of coronary artery 01/25/2015  . Chronic kidney disease (CKD), stage III (moderate) ([place]) 01/25/2015  . Bloodgood disease 01/25/2015  . Blood glucose elevated 01/25/2015  . Hyperlipidemia 01/25/2015  . Cannot sleep 01/25/2015  . Hypertension 11/02/2014  . Acid reflux 11/02/2014  . Arthritis 10/19/2014  .  Adenomatous colon polyp 07/27/2013  . Gastric catarrh 07/27/2013  . Bergmann's syndrome 07/27/2013  . Barrett esophagus 07/07/2013  . Osteopenia 12/13/2009  . PAD (peripheral artery disease) ([place]) 09/24/2007  . DD (diverticular disease) 12/19/2005    Past Surgical History:  Procedure Laterality Date  . ABDOMINAL HYSTERECTOMY  1980   due to dysfunctional uterine bleeding  . APPENDECTOMY  1980  . BREAST SURGERY Left 2000   biopsy  . CARDIAC CATHETERIZATION    . COLONOSCOPY    . CORONARY ARTERY BYPASS GRAFT  2006  . HEMORRHOID SURGERY    . LOWER EXTREMITY ANGIOGRAPHY Right 02/25/2017   Procedure: LOWER EXTREMITY ANGIOGRAPHY;  Surgeon: Algernon Huxley, MD;  Location: [facility];  Service: Cardiovascular;  Laterality: Right;  . LOWER EXTREMITY INTERVENTION  02/25/2017   Procedure: LOWER EXTREMITY INTERVENTION;  Surgeon: Algernon Huxley, MD;  Location: [facility];  Service: Cardiovascular;;  . LUNG BIOPSY  1999   Negative  . TONSILLECTOMY      Her family history includes Alzheimer's disease in her mother; CAD in her mother; Diabetes in her paternal grandfather; Hearing loss in her son; Heart attack in her father; Heart disease in her sister; Hyperlipidemia in her mother; Hypertension in her mother; Lung disease in her sister.      Current Outpatient Medications:  .  acetaminophen (TYLENOL) 500 MG tablet, Take 500 mg by mouth every 8 (eight) hours as needed for mild pain or moderate pain. , Disp: , Rfl:  .  albuterol (PROVENTIL HFA;VENTOLIN HFA) 108 (90 Base) MCG/ACT inhaler, Inhale 2 puffs into the lungs every 6 (six) hours as needed for wheezing or shortness of breath., Disp: 1 Inhaler, Rfl: 5 .  Ascorbic Acid (VITAMIN C) 1000 MG tablet, Take 1,000 mg by mouth daily., Disp: , Rfl:  .  aspirin 81 MG tablet, Take 81 mg by mouth daily. , Disp: , Rfl:  .  Calcium Carb-Cholecalciferol (CALCIUM 600+D3 PO), Take 1 tablet by mouth daily., Disp: , Rfl:  .  docusate sodium (COLACE)  100 MG capsule, Take 100 mg by mouth at bedtime., Disp: , Rfl:  .  metoprolol tartrate (LOPRESSOR) 25 MG tablet, TAKE 1 TABLET BY MOUTH TWICE A DAY, Disp: 180 tablet, Rfl: 1 .  MULTIPLE VITAMIN PO, Take 1 tablet by mouth daily. , Disp: , Rfl:  .  pantoprazole (PROTONIX) 40 MG tablet, Take 40 mg by mouth 2 (two) times daily., Disp: , Rfl:  .  ranitidine (ZANTAC) 300 MG tablet, Take by mouth., Disp: , Rfl:  .  simvastatin (ZOCOR) 40 MG tablet, TAKE 1 TABLET BY MOUTH EVERYDAY AT BEDTIME, Disp: 90 tablet, Rfl: 3 .  SYMBICORT 160-4.5 MCG/ACT inhaler, INHALE 1 PUFF TWICE A DAY, Disp: 10.2 Inhaler, Rfl: 3 .  tobramycin (TOBREX) 0.3 % ophthalmic solution, INSTILL 1 DROP IN LEFT EYE 4 TIMES/DAY BEGIN 1 DAY BEFORE TREATMENT DAY OF TREATMENT AND 1  DAY AFTER, Disp: , Rfl: 5  Patient Care Team: Mar Daring, PA-C as PCP - General (Family Medicine) [name] as Consulting Physician (Pulmonary Disease) [name], MD as Consulting Physician (Ophthalmology)     Objective:   Vitals: BP (!) 144/74 (BP Location: Right Arm, Patient Position: Sitting, Cuff Size: Large)   Pulse 68   Temp 98 F (36.7 C) (Oral)   Resp 16   Ht 5\' 6"  (1.676 m)   Wt 172 lb (78 kg)   SpO2 94%   BMI 27.76 kg/m   Physical Exam Vitals signs reviewed.  Constitutional:      General: She is not in acute distress.    Appearance: She is well-developed. She is not diaphoretic.  HENT:     Head: Normocephalic and atraumatic.     Right Ear: Tympanic membrane, ear canal and external ear normal. Decreased hearing noted.     Left Ear: Tympanic membrane, ear canal and external ear normal. Decreased hearing noted.     Nose: Nose normal.     Mouth/Throat:     Lips: Pink.     Dentition: Has dentures.     Palate: No mass.     Pharynx: Oropharynx is clear. Uvula midline. No oropharyngeal exudate.  Eyes:     General: No scleral icterus.       Right eye: No discharge.        Left eye: No discharge.     Conjunctiva/sclera:  Conjunctivae normal.     Pupils: Pupils are equal, round, and reactive to light.  Neck:     Musculoskeletal: Normal range of motion and neck supple.     Thyroid: No thyromegaly.     Vascular: No carotid bruit or JVD.     Trachea: No tracheal deviation.  Cardiovascular:     Rate and Rhythm: Normal rate and regular rhythm.     Heart sounds: Normal heart sounds. No murmur. No friction rub. No gallop.   Pulmonary:     Effort: Pulmonary effort is normal. No respiratory distress.     Breath sounds: Normal breath sounds. No wheezing or rales.  Chest:     Chest wall: No tenderness.     Breasts:        Right: Normal. No mass, skin change or tenderness.        Left: Normal. No mass, skin change or tenderness.  Abdominal:     General: Bowel sounds are normal. There is no distension.     Palpations: Abdomen is soft. There is no mass.     Tenderness: There is no abdominal tenderness. There is no guarding or rebound.  Musculoskeletal: Normal range of motion.        General: No tenderness.  Lymphadenopathy:     Cervical: No cervical adenopathy.     Upper Body:     Right upper body: No supraclavicular, axillary or pectoral adenopathy.     Left upper body: No supraclavicular, axillary or pectoral adenopathy.  Skin:    General: Skin is warm and dry.     Findings: No rash.  Neurological:     Mental Status: She is alert and oriented to person, place, and time.  Psychiatric:        Behavior: Behavior normal.        Thought Content: Thought content normal.        Judgment: Judgment normal.     Activities of Daily Living In your present state of health, do you have any difficulty performing  the following activities: 02/17/2018 02/22/2017  Hearing? N -  Vision? Y -  Difficulty concentrating or making decisions? Y -  Walking or climbing stairs? Y -  Dressing or bathing? N -  Doing errands, shopping? N N  Some recent data might be hidden    Fall Risk Assessment Fall Risk  02/17/2018  01/24/2017 01/27/2016 01/25/2015  Falls in the past year? 0 No No No     Depression Screen PHQ 2/9 Scores 02/17/2018 01/24/2017 01/27/2016 01/25/2015  PHQ - 2 Score 0 2 0 0  PHQ- 9 Score 4 4 - -   Audit-C Alcohol Use Screening   Alcohol Use Disorder Test (AUDIT) 02/17/2018  1. How often do you have a drink containing alcohol? 0  2. How many drinks containing alcohol do you have on a typical day when you are drinking? 0  3. How often do you have six or more drinks on one occasion? 0  AUDIT-C Score 0    A score of 3 or more in women, and 4 or more in men indicates increased risk for alcohol abuse, EXCEPT if all of the points are from question 1   Cognitive Testing - 6-CIT  Patient declined    No flowsheet data found.   Assessment & Plan:     Annual Wellness Visit  Reviewed patient's Family Medical History Reviewed and updated list of patient's medical providers Assessment of cognitive impairment was done Assessed patient's functional ability Established a written schedule for health screening [place] Completed and Reviewed  Exercise Activities and Dietary recommendations Goals    . DIET - INCREASE WATER INTAKE     Recommend increasing water intake to 6-8 glasses of water a day.     . Exercise 150 minutes per week (moderate activity)       Immunization History  Administered Date(s) Administered  . Influenza Split 12/01/2005, 11/17/2007  . Influenza, High Dose Seasonal PF 10/25/2016  . Influenza-Unspecified 10/06/2013, 10/07/2015, 10/25/2016, 10/28/2017  . Pneumococcal Conjugate-13 12/28/2013  . Pneumococcal Polysaccharide-23 05/31/2003  . Zoster 03/17/2008    Health Maintenance  Topic Date Due  . TETANUS/TDAP  10/15/1956  . INFLUENZA VACCINE  Completed  . DEXA SCAN  Completed  . PNA vac Low Risk Adult  Completed     Discussed health benefits of physical activity, and encouraged her to engage in regular exercise appropriate for  her age and condition.    1. Annual physical exam Normal physical exam today. Will check labs as below and f/u pending lab results. If labs are stable and WNL she will not need to have these rechecked for one year at her next annual physical exam. She is to call the office in the meantime if she has any acute issue, questions or concerns. - CBC w/Diff/Platelet - Comprehensive Metabolic Panel (CMET) - Lipid Profile - HgB A1c  2. Chronic obstructive pulmonary disease, unspecified COPD type ([employer]) Stable. Followed by Pulmonology, Dr. Humphrey Rolls.  - CBC w/Diff/Platelet - Comprehensive Metabolic Panel (CMET) - Lipid Profile - HgB A1c  3. PAD (peripheral artery disease) ([facility]) Stable. Followed by Vascular surgery, Dr. Lucky Cowboy.  - CBC w/Diff/Platelet - Comprehensive Metabolic Panel (CMET) - Lipid Profile - HgB A1c  4. Aortic atherosclerosis ([facility]) Stable. No symptoms. Followed by Dr. [name]. Had echo in Nov that was stable and unchanged.  - CBC w/Diff/Platelet - Comprehensive Metabolic Panel (CMET) - Lipid Profile - HgB A1c  5. Essential hypertension Stable. Continue Metoprolol 25mg  BID. Will check labs as below  and f/u pending results. - CBC w/Diff/Platelet - Comprehensive Metabolic Panel (CMET) - Lipid Profile - HgB A1c  6. Chronic kidney disease (CKD), stage III (moderate) ([facility]) Stable. Will check labs as below and f/u pending results. - CBC w/Diff/Platelet - Comprehensive Metabolic Panel (CMET) - Lipid Profile - HgB A1c  7. Blood glucose elevated Diet controlled. Has lost 4 pounds since November. Will check labs as below and f/u pending results. - CBC w/Diff/Platelet - Comprehensive Metabolic Panel (CMET) - Lipid Profile - HgB A1c  8. Pure hypercholesterolemia Stable. Continue simvastatin 40mg . Will check labs as below and f/u pending results. - CBC w/Diff/Platelet - Comprehensive Metabolic Panel (CMET) - Lipid Profile - HgB A1c  9. Difficulty sleeping Advised to use  melatonin nightly x 2 weeks. Call if no improvements in sleep. May consider adding trazodone for sleep if no improvements on melatonin. Continue to use CPAP.   ---------------------------------------------------------------------------    Mar Daring, PA-C  [facility]

## 2018-02-18 LAB — CBC WITH DIFFERENTIAL/PLATELET
BASOS: 0 %
Basophils Absolute: 0 10*3/uL (ref 0.0–0.2)
EOS (ABSOLUTE): 0.1 10*3/uL (ref 0.0–0.4)
EOS: 2 %
HEMATOCRIT: 37.4 % (ref 34.0–46.6)
HEMOGLOBIN: 12.5 g/dL (ref 11.1–15.9)
Immature Grans (Abs): 0 10*3/uL (ref 0.0–0.1)
Immature Granulocytes: 0 %
LYMPHS ABS: 1.4 10*3/uL (ref 0.7–3.1)
Lymphs: 24 %
MCH: 33.2 pg — AB (ref 26.6–33.0)
MCHC: 33.4 g/dL (ref 31.5–35.7)
MCV: 100 fL — AB (ref 79–97)
MONOCYTES: 8 %
MONOS ABS: 0.5 10*3/uL (ref 0.1–0.9)
NEUTROS ABS: 3.8 10*3/uL (ref 1.4–7.0)
Neutrophils: 66 %
Platelets: 167 10*3/uL (ref 150–450)
RBC: 3.76 x10E6/uL — ABNORMAL LOW (ref 3.77–5.28)
RDW: 13.2 % (ref 11.7–15.4)
WBC: 5.7 10*3/uL (ref 3.4–10.8)

## 2018-02-18 LAB — COMPREHENSIVE METABOLIC PANEL
A/G RATIO: 1.6 (ref 1.2–2.2)
ALBUMIN: 4.3 g/dL (ref 3.5–4.7)
ALT: 10 IU/L (ref 0–32)
AST: 21 IU/L (ref 0–40)
Alkaline Phosphatase: 86 IU/L (ref 39–117)
BUN / CREAT RATIO: 18 (ref 12–28)
BUN: 19 mg/dL (ref 8–27)
Bilirubin Total: 0.5 mg/dL (ref 0.0–1.2)
CO2: 22 mmol/L (ref 20–29)
Calcium: 9.4 mg/dL (ref 8.7–10.3)
Chloride: 105 mmol/L (ref 96–106)
Creatinine, Ser: 1.05 mg/dL — ABNORMAL HIGH (ref 0.57–1.00)
GFR, EST AFRICAN AMERICAN: 58 mL/min/{1.73_m2} — AB (ref >59)
GFR, EST NON AFRICAN AMERICAN: 50 mL/min/{1.73_m2} — AB (ref >59)
GLOBULIN, TOTAL: 2.7 g/dL (ref 1.5–4.5)
Glucose: 100 mg/dL — ABNORMAL HIGH (ref 65–99)
POTASSIUM: 4.7 mmol/L (ref 3.5–5.2)
SODIUM: 144 mmol/L (ref 134–144)
Total Protein: 7 g/dL (ref 6.0–8.5)

## 2018-02-18 LAB — HEMOGLOBIN A1C
ESTIMATED AVERAGE GLUCOSE: 123 mg/dL
HEMOGLOBIN A1C: 5.9 % — AB (ref 4.8–5.6)

## 2018-02-18 LAB — LIPID PANEL
CHOL/HDL RATIO: 3 ratio (ref 0.0–4.4)
Cholesterol, Total: 165 mg/dL (ref 100–199)
HDL: 55 mg/dL (ref >39)
LDL Calculated: 74 mg/dL (ref 0–99)
TRIGLYCERIDES: 178 mg/dL — AB (ref 0–149)
VLDL Cholesterol Cal: 36 mg/dL (ref 5–40)

## 2018-02-19 ENCOUNTER — Telehealth: Payer: Self-pay

## 2018-02-19 NOTE — Telephone Encounter (Signed)
-----   Message from Mar Daring, [place] sent at 02/19/2018  8:06 AM EST ----- All labs are within normal limits and stable.  Thanks! -JB

## 2018-02-19 NOTE — Telephone Encounter (Signed)
Patient advised.KW 

## 2018-04-13 ENCOUNTER — Other Ambulatory Visit: Payer: Self-pay | Admitting: Physician Assistant

## 2018-04-13 DIAGNOSIS — I10 Essential (primary) hypertension: Secondary | ICD-10-CM

## 2018-05-15 ENCOUNTER — Other Ambulatory Visit: Payer: Self-pay

## 2018-05-15 ENCOUNTER — Other Ambulatory Visit: Payer: Self-pay | Admitting: Internal Medicine

## 2018-05-15 MED ORDER — BUDESONIDE-FORMOTEROL FUMARATE 160-4.5 MCG/ACT IN AERO: INHALATION_SPRAY | RESPIRATORY_TRACT | 0 refills | Status: DC

## 2018-06-11 ENCOUNTER — Ambulatory Visit: Payer: Medicare Other

## 2018-06-11 ENCOUNTER — Other Ambulatory Visit: Payer: Self-pay

## 2018-06-11 DIAGNOSIS — G4733 Obstructive sleep apnea (adult) (pediatric): Secondary | ICD-10-CM

## 2018-06-11 NOTE — Progress Notes (Signed)
95 percentile pressure 12   95th percentile leak 50.1   apnea index 1.5 /hr  apnea-hypopnea index  3.2 /hr   total days used  >4 hr 88 days  total days used <4 hr 2 days  Total compliance 98 percent  She is doing great on cpap. Went over humidifier. Continue on cpap

## 2018-06-23 ENCOUNTER — Encounter: Payer: Self-pay | Admitting: Adult Health

## 2018-06-23 ENCOUNTER — Ambulatory Visit: Payer: Medicare Other | Admitting: Adult Health

## 2018-06-23 ENCOUNTER — Other Ambulatory Visit: Payer: Self-pay

## 2018-06-23 VITALS — BP 154/74 | HR 73 | Resp 16 | Ht 66.0 in | Wt 177.2 lb

## 2018-06-23 DIAGNOSIS — K219 Gastro-esophageal reflux disease without esophagitis: Secondary | ICD-10-CM | POA: Diagnosis not present

## 2018-06-23 DIAGNOSIS — R0602 Shortness of breath: Secondary | ICD-10-CM

## 2018-06-23 DIAGNOSIS — Z9989 Dependence on other enabling machines and devices: Secondary | ICD-10-CM

## 2018-06-23 DIAGNOSIS — J449 Chronic obstructive pulmonary disease, unspecified: Secondary | ICD-10-CM | POA: Diagnosis not present

## 2018-06-23 DIAGNOSIS — G4733 Obstructive sleep apnea (adult) (pediatric): Secondary | ICD-10-CM | POA: Diagnosis not present

## 2018-06-23 NOTE — Progress Notes (Signed)
[facility] [place], [place] [address]  Pulmonary Sleep Medicine   Office Visit Note  Patient Name: Christina [name] DOB: Jun 19, 1935 MRN [id number]  Date of Service: 06/23/2018  Complaints/HPI: Pt is here for 6 month follow up on copd, osa, and GERD. She denies any new issues.  She is wearing her cpap at night with no difficulty.  She is cleaning her machine daily.  She is replacing her disposable parts as indicated.  She Denies Chest pain, Shortness of breath, palpitations, headache, hemoptysis, or sinus issues.  She continues to take Pantoprazole daily for GERD symptoms.  She is well controlled at this time.  She is using Symbicort and proair to control her copd and has excellent results currently.   ROS  General: (-) fever, (-) chills, (-) night sweats, (-) weakness Skin: (-) rashes, (-) itching,. Eyes: (-) visual changes, (-) redness, (-) itching. Nose and Sinuses: (-) nasal stuffiness or itchiness, (-) postnasal drip, (-) nosebleeds, (-) sinus trouble. Mouth and Throat: (-) sore throat, (-) hoarseness. Neck: (-) swollen glands, (-) enlarged thyroid, (-) neck pain. Respiratory: - cough, (-) bloody sputum, - shortness of breath, - wheezing. Cardiovascular: + ankle swelling, (-) chest pain. Lymphatic: (-) lymph node enlargement. Neurologic: (-) numbness, (-) tingling. Psychiatric: (-) anxiety, (-) depression   Current Medication: Outpatient Encounter Medications as of 06/23/2018  Medication Sig  . acetaminophen (TYLENOL) 500 MG tablet Take 500 mg by mouth every 8 (eight) hours as needed for mild pain or moderate pain.   Marland Kitchen albuterol (PROVENTIL HFA;VENTOLIN HFA) 108 (90 Base) MCG/ACT inhaler Inhale 2 puffs into the lungs every 6 (six) hours as needed for wheezing or shortness of breath.  . Ascorbic Acid (VITAMIN C) 1000 MG tablet Take 1,000 mg by mouth daily.  Marland Kitchen aspirin 81 MG tablet Take 81 mg by mouth daily.   . budesonide-formoterol (SYMBICORT)  160-4.5 MCG/ACT inhaler TAKE 1 PUFF BY MOUTH TWICE A DAY  . Calcium Carb-Cholecalciferol (CALCIUM 600+D3 PO) Take 1 tablet by mouth daily.  Marland Kitchen docusate sodium (COLACE) 100 MG capsule Take 100 mg by mouth at bedtime.  . metoprolol tartrate (LOPRESSOR) 25 MG tablet TAKE 1 TABLET BY MOUTH TWICE A DAY  . Multiple Vitamin (MULTI-VITAMIN DAILY PO) Take by mouth.  . Multiple Vitamins-Minerals (VITAMIN D3 COMPLETE PO) Take by mouth.  . pantoprazole (PROTONIX) 40 MG tablet Take 40 mg by mouth 2 (two) times daily.  . ranitidine (ZANTAC) 300 MG tablet Take by mouth.  . simvastatin (ZOCOR) 40 MG tablet TAKE 1 TABLET BY MOUTH EVERYDAY AT BEDTIME  . tobramycin (TOBREX) 0.3 % ophthalmic solution INSTILL 1 DROP IN LEFT EYE 4 TIMES/DAY BEGIN 1 DAY BEFORE TREATMENT DAY OF TREATMENT AND 1 DAY AFTER  . [DISCONTINUED] Ascorbic Acid (VITAMIN C) 1000 MG tablet Take by mouth.  . [DISCONTINUED] MULTIPLE VITAMIN PO Take 1 tablet by mouth daily.    No facility-administered encounter medications on file as of 06/23/2018.     Surgical History: Past Surgical History:  Procedure Laterality Date  . ABDOMINAL HYSTERECTOMY  1980   due to dysfunctional uterine bleeding  . APPENDECTOMY  1980  . BREAST SURGERY Left 2000   biopsy  . CARDIAC CATHETERIZATION    . COLONOSCOPY    . CORONARY ARTERY BYPASS GRAFT  2006  . HEMORRHOID SURGERY    . LOWER EXTREMITY ANGIOGRAPHY Right 02/25/2017   Procedure: LOWER EXTREMITY ANGIOGRAPHY;  Surgeon: Algernon Huxley, MD;  Location: [place] [facility];  Service: Cardiovascular;  Laterality: Right;  . LOWER EXTREMITY INTERVENTION  02/25/2017   Procedure: LOWER EXTREMITY INTERVENTION;  Surgeon: Algernon Huxley, MD;  Location: [place] [facility];  Service: Cardiovascular;;  . LUNG BIOPSY  1999   Negative  . TONSILLECTOMY      Medical History: Past Medical History:  Diagnosis Date  . Arthritis   . COPD (chronic obstructive pulmonary disease) ([place])   . Coronary artery disease   . GERD  (gastroesophageal reflux disease)   . Hyperlipidemia   . Hypertension   . Macular degeneration   . PAD (peripheral artery disease) ([place])   . Sleep apnea     Family History: Family History  Problem Relation Age of Onset  . Hypertension Mother   . Hyperlipidemia Mother   . Alzheimer's disease Mother   . CAD Mother   . Heart attack Father   . Lung disease Sister   . Heart disease Sister   . Diabetes Paternal Grandfather        Type 2  . Hearing loss Son     Social History: Social History   Socioeconomic History  . Marital status: Married    Spouse name: Cliffton Mathis Bud  . Number of children: 2  . Years of education: 67  . Highest education level: 12th grade  Occupational History  . Occupation: retired  Scientific laboratory technician  . Financial resource strain: Not hard at all  . Food insecurity:    Worry: Never true    Inability: Never true  . Transportation needs:    Medical: No    Non-medical: No  Tobacco Use  . Smoking status: Former Smoker    Packs/day: 0.50    Years: 5.00    Pack years: 2.50    Last attempt to quit: 04/24/2004    Years since quitting: 14.1  . Smokeless tobacco: Never Used  Substance and Sexual Activity  . Alcohol use: No  . Drug use: No  . Sexual activity: Yes  Lifestyle  . Physical activity:    Days per week: Not on file    Minutes per session: Not on file  . Stress: Only a little  Relationships  . Social connections:    Talks on phone: Not on file    Gets together: Not on file    Attends religious service: Not on file    Active member of club or organization: Not on file    Attends meetings of clubs or organizations: Not on file    Relationship status: Not on file  . Intimate partner violence:    Fear of current or ex partner: No    Emotionally abused: No    Physically abused: No    Forced sexual activity: No  Other Topics Concern  . Not on file  Social History Narrative  . Not on file    Vital Signs: Blood pressure (!) 154/74,  pulse 73, resp. rate 16, height 5\' 6"  (1.676 m), weight 177 lb 3.2 oz (80.4 kg), SpO2 95 %.  Examination: General Appearance: The patient is well-developed, well-nourished, and in no distress. Skin: Gross inspection of skin unremarkable. Head: normocephalic, no gross deformities. Eyes: no gross deformities noted. ENT: ears appear grossly normal no exudates. Neck: Supple. No thyromegaly. No LAD. Respiratory: clear bilateraly. Cardiovascular: Normal S1 and S2 without murmur or rub. Extremities: No cyanosis. pulses are equal. Neurologic: Alert and oriented. No involuntary movements.  LABS: No results found for this or any previous visit (from the past 2160 hour(s)).  Radiology: No  results found.  No results found.  No results found.    Assessment and Plan: Patient Active Problem List   Diagnosis Date Noted  . Aortic atherosclerosis ([place]) 10/26/2016  . Claudication ([place]) 10/26/2016  . COPD (chronic obstructive pulmonary disease) ([place]) 10/22/2016  . OSA on CPAP 09/16/2015  . Abnormal chest x-ray 01/25/2015  . Atrophic vaginitis 01/25/2015  . Atherosclerosis of coronary artery 01/25/2015  . Chronic kidney disease (CKD), stage III (moderate) ([place]) 01/25/2015  . Bloodgood disease 01/25/2015  . Blood glucose elevated 01/25/2015  . Hyperlipidemia 01/25/2015  . Cannot sleep 01/25/2015  . Hypertension 11/02/2014  . Acid reflux 11/02/2014  . Arthritis 10/19/2014  . Adenomatous colon polyp 07/27/2013  . Gastric catarrh 07/27/2013  . Bergmann's syndrome 07/27/2013  . Barrett esophagus 07/07/2013  . Osteopenia 12/13/2009  . PAD (peripheral artery disease) ([place]) 09/24/2007  . DD (diverticular disease) 12/19/2005    1. OSA on CPAP Continue using cpap when sleeping as prescribed.  She continues to have good relief of symptoms.   2. Chronic obstructive pulmonary disease, unspecified COPD type ([place]) Will get PFT to evaluate current Pulmonary function.  - Pulmonary Function Test;  Future  3. Gastroesophageal reflux disease without esophagitis Stable, continue present management.   4. SOB (shortness of breath) FEV1 1.5 which is 69% of the pre-predicted value on today's spirometry.  - Spirometry with Graph  General Counseling: I have discussed the findings of the evaluation and examination with Mardene Celeste.  I have also discussed any further diagnostic evaluation thatmay be needed or ordered today. Bernis verbalizes understanding of the findings of todays visit. We also reviewed her medications today and discussed drug interactions and side effects including but not limited excessive drowsiness and altered mental states. We also discussed that there is always a risk not just to her but also people around her. she has been encouraged to call the office with any questions or concerns that should arise related to todays visit.    Time spent: 25 This patient was seen by Orson Gear AGNP-C in Collaboration with Dr. Devona Konig as a part of collaborative care agreement.   I have personally obtained a history, examined the patient, evaluated laboratory and imaging results, formulated the assessment and plan and placed orders.    Allyne Gee, MD [facility] Pulmonary and Critical Care Sleep medicine

## 2018-06-23 NOTE — Patient Instructions (Signed)
Chronic Obstructive Pulmonary Disease Chronic obstructive pulmonary disease (COPD) is a long-term (chronic) lung problem. When you have COPD, it is hard for air to get in and out of your lungs. Usually the condition gets worse over time, and your lungs will never return to normal. There are things you can do to keep yourself as healthy as possible.  Your doctor may treat your condition with: ? Medicines. ? Oxygen. ? Lung surgery.  Your doctor may also recommend: ? Rehabilitation. This includes steps to make your body work better. It may involve a team of specialists. ? Quitting smoking, if you smoke. ? Exercise and changes to your diet. ? Comfort measures (palliative care). Follow these instructions at home: Medicines  Take over-the-counter and prescription medicines only as told by your doctor.  Talk to your doctor before taking any cough or allergy medicines. You may need to avoid medicines that cause your lungs to be dry. Lifestyle  If you smoke, stop. Smoking makes the problem worse. If you need help quitting, ask your doctor.  Avoid being around things that make your breathing worse. This may include smoke, chemicals, and fumes.  Stay active, but remember to rest as well.  Learn and use tips on how to relax.  Make sure you get enough sleep. Most adults need at least 7 hours of sleep every night.  Eat healthy foods. Eat smaller meals more often. Rest before meals. Controlled breathing Learn and use tips on how to control your breathing as told by your doctor. Try:  Breathing in (inhaling) through your nose for 1 second. Then, pucker your lips and breath out (exhale) through your lips for 2 seconds.  Putting one hand on your belly (abdomen). Breathe in slowly through your nose for 1 second. Your hand on your belly should move out. Pucker your lips and breathe out slowly through your lips. Your hand on your belly should move in as you breathe out.  Controlled coughing Learn  and use controlled coughing to clear mucus from your lungs. Follow these steps: 1. Lean your head a little forward. 2. Breathe in deeply. 3. Try to hold your breath for 3 seconds. 4. Keep your mouth slightly open while coughing 2 times. 5. Spit any mucus out into a tissue. 6. Rest and do the steps again 1 or 2 times as needed. General instructions  Make sure you get all the shots (vaccines) that your doctor recommends. Ask your doctor about a flu shot and a pneumonia shot.  Use oxygen therapy and pulmonary rehabilitation if told by your doctor. If you need home oxygen therapy, ask your doctor if you should buy a tool to measure your oxygen level (oximeter).  Make a COPD action plan with your doctor. This helps you to know what to do if you feel worse than usual.  Manage any other conditions you have as told by your doctor.  Avoid going outside when it is very hot, cold, or humid.  Avoid people who have a sickness you can catch (contagious).  Keep all follow-up visits as told by your doctor. This is important. Contact a doctor if:  You cough up more mucus than usual.  There is a change in the color or thickness of the mucus.  It is harder to breathe than usual.  Your breathing is faster than usual.  You have trouble sleeping.  You need to use your medicines more often than usual.  You have trouble doing your normal activities such as getting dressed   or walking around the house. Get help right away if:  You have shortness of breath while resting.  You have shortness of breath that stops you from: ? Being able to talk. ? Doing normal activities.  Your chest hurts for longer than 5 minutes.  Your skin color is more blue than usual.  Your pulse oximeter shows that you have low oxygen for longer than 5 minutes.  You have a fever.  You feel too tired to breathe normally. Summary  Chronic obstructive pulmonary disease (COPD) is a long-term lung problem.  The way your  lungs work will never return to normal. Usually the condition gets worse over time. There are things you can do to keep yourself as healthy as possible.  Take over-the-counter and prescription medicines only as told by your doctor.  If you smoke, stop. Smoking makes the problem worse. This information is not intended to replace advice given to you by your health care provider. Make sure you discuss any questions you have with your health care provider. Document Released: 07/11/2007 Document Revised: 02/27/2016 Document Reviewed: 02/27/2016 Elsevier Interactive Patient Education  2019 Elsevier Inc.  

## 2018-06-23 NOTE — Progress Notes (Signed)
Pt blood pressure elevated, informed provider. 

## 2018-07-16 ENCOUNTER — Other Ambulatory Visit: Payer: Self-pay

## 2018-07-16 ENCOUNTER — Ambulatory Visit: Payer: Medicare Other | Admitting: Internal Medicine

## 2018-07-16 DIAGNOSIS — J449 Chronic obstructive pulmonary disease, unspecified: Secondary | ICD-10-CM

## 2018-07-16 LAB — PULMONARY FUNCTION TEST

## 2018-07-20 NOTE — Procedures (Signed)
[facility] [place], [address]  DATE OF SERVICE: July 16, 2018  Complete Pulmonary Function Testing Interpretation:  FINDINGS:  Spirometry was performed.  The forced vital capacity is mildly decreased.  The FEV1 is 1.53 L which is 75% of predicted and is mildly decreased.  FEV1 FVC ratio is mildly decreased.  Postbronchodilator there does not appear to be any improvement in the FEV1 however clinical improvement may occur in the absence of spirometric improvement.  IMPRESSION:  Mild obstructive lung disease.  Lung volumes not performed therefore restrictive lung disease is not entirely ruled out clinical correlation is recommended.  Allyne Gee, MD [facility] Pulmonary Critical Care Medicine Sleep Medicine

## 2018-08-26 ENCOUNTER — Other Ambulatory Visit: Payer: Self-pay | Admitting: Adult Health

## 2018-10-27 ENCOUNTER — Other Ambulatory Visit: Payer: Self-pay

## 2018-10-27 ENCOUNTER — Encounter: Payer: Self-pay | Admitting: Internal Medicine

## 2018-10-27 ENCOUNTER — Ambulatory Visit (INDEPENDENT_AMBULATORY_CARE_PROVIDER_SITE_OTHER): Payer: Medicare Other | Admitting: Internal Medicine

## 2018-10-27 VITALS — BP 150/60 | HR 70 | Resp 16 | Ht 66.0 in | Wt 176.0 lb

## 2018-10-27 DIAGNOSIS — Z9989 Dependence on other enabling machines and devices: Secondary | ICD-10-CM

## 2018-10-27 DIAGNOSIS — J449 Chronic obstructive pulmonary disease, unspecified: Secondary | ICD-10-CM | POA: Diagnosis not present

## 2018-10-27 DIAGNOSIS — K219 Gastro-esophageal reflux disease without esophagitis: Secondary | ICD-10-CM

## 2018-10-27 DIAGNOSIS — R0602 Shortness of breath: Secondary | ICD-10-CM

## 2018-10-27 DIAGNOSIS — G4733 Obstructive sleep apnea (adult) (pediatric): Secondary | ICD-10-CM | POA: Diagnosis not present

## 2018-10-27 MED ORDER — ALBUTEROL SULFATE HFA 108 (90 BASE) MCG/ACT IN AERS: 2.0000 | INHALATION_SPRAY | Freq: Four times a day (QID) | RESPIRATORY_TRACT | 3 refills | Status: DC | PRN

## 2018-10-27 NOTE — Progress Notes (Signed)
Samples of symbicort given , price of inhalers has gone up for her.

## 2018-10-27 NOTE — Progress Notes (Signed)
[facility] [place], [place] [address]  Pulmonary Sleep Medicine   Office Visit Note  Patient Name: Christina [name] DOB: 05-May-1935 MRN [id number]  Date of Service: 10/27/2018  Complaints/HPI: Pt is here for follow up on osa and copd.  She is doing well.  She cleans her machine by hand and changes her filters and tubing as directed.  She reports sleeping well, and having good results with cpap.    ROS  General: (-) fever, (-) chills, (-) night sweats, (-) weakness Skin: (-) rashes, (-) itching,. Eyes: (-) visual changes, (-) redness, (-) itching. Nose and Sinuses: (-) nasal stuffiness or itchiness, (-) postnasal drip, (-) nosebleeds, (-) sinus trouble. Mouth and Throat: (-) sore throat, (-) hoarseness. Neck: (-) swollen glands, (-) enlarged thyroid, (-) neck pain. Respiratory: - cough, (-) bloody sputum, - shortness of breath, - wheezing. Cardiovascular: - ankle swelling, (-) chest pain. Lymphatic: (-) lymph node enlargement. Neurologic: (-) numbness, (-) tingling. Psychiatric: (-) anxiety, (-) depression   Current Medication: Outpatient Encounter Medications as of 10/27/2018  Medication Sig  . acetaminophen (TYLENOL) 500 MG tablet Take 500 mg by mouth every 8 (eight) hours as needed for mild pain or moderate pain.   Marland Kitchen albuterol (VENTOLIN HFA) 108 (90 Base) MCG/ACT inhaler Inhale 2 puffs into the lungs every 6 (six) hours as needed for wheezing or shortness of breath.  . Ascorbic Acid (VITAMIN C) 1000 MG tablet Take 1,000 mg by mouth daily.  Marland Kitchen aspirin 81 MG tablet Take 81 mg by mouth daily.   . budesonide-formoterol (SYMBICORT) 160-4.5 MCG/ACT inhaler INHALE 1 PUFF BY MOUTH TWICE A DAY  . Calcium Carb-Cholecalciferol (CALCIUM 600+D3 PO) Take 1 tablet by mouth daily.  Marland Kitchen docusate sodium (COLACE) 100 MG capsule Take 100 mg by mouth at bedtime.  . metoprolol tartrate (LOPRESSOR) 25 MG tablet TAKE 1 TABLET BY MOUTH TWICE A DAY  . Multiple Vitamin  (MULTI-VITAMIN DAILY PO) Take by mouth.  . Multiple Vitamins-Minerals (VITAMIN D3 COMPLETE PO) Take by mouth.  . pantoprazole (PROTONIX) 40 MG tablet Take 40 mg by mouth 2 (two) times daily.  . simvastatin (ZOCOR) 40 MG tablet TAKE 1 TABLET BY MOUTH EVERYDAY AT BEDTIME  . tobramycin (TOBREX) 0.3 % ophthalmic solution INSTILL 1 DROP IN LEFT EYE 4 TIMES/DAY BEGIN 1 DAY BEFORE TREATMENT DAY OF TREATMENT AND 1 DAY AFTER  . [DISCONTINUED] albuterol (PROVENTIL HFA;VENTOLIN HFA) 108 (90 Base) MCG/ACT inhaler Inhale 2 puffs into the lungs every 6 (six) hours as needed for wheezing or shortness of breath.  . ranitidine (ZANTAC) 300 MG tablet Take by mouth.   No facility-administered encounter medications on file as of 10/27/2018.     Surgical History: Past Surgical History:  Procedure Laterality Date  . ABDOMINAL HYSTERECTOMY  1980   due to dysfunctional uterine bleeding  . APPENDECTOMY  1980  . BREAST SURGERY Left 2000   biopsy  . CARDIAC CATHETERIZATION    . COLONOSCOPY    . CORONARY ARTERY BYPASS GRAFT  2006  . HEMORRHOID SURGERY    . LOWER EXTREMITY ANGIOGRAPHY Right 02/25/2017   Procedure: LOWER EXTREMITY ANGIOGRAPHY;  Surgeon: Algernon Huxley, MD;  Location: [facility];  Service: Cardiovascular;  Laterality: Right;  . LOWER EXTREMITY INTERVENTION  02/25/2017   Procedure: LOWER EXTREMITY INTERVENTION;  Surgeon: Algernon Huxley, MD;  Location: [facility];  Service: Cardiovascular;;  . LUNG BIOPSY  1999   Negative  . TONSILLECTOMY      Medical  History: Past Medical History:  Diagnosis Date  . Arthritis   . COPD (chronic obstructive pulmonary disease) ([place])   . Coronary artery disease   . GERD (gastroesophageal reflux disease)   . Hyperlipidemia   . Hypertension   . Macular degeneration   . PAD (peripheral artery disease) ([place])   . Sleep apnea     Family History: Family History  Problem Relation Age of Onset  . Hypertension Mother   . Hyperlipidemia Mother   .  Alzheimer's disease Mother   . CAD Mother   . Heart attack Father   . Lung disease Sister   . Heart disease Sister   . Diabetes Paternal Grandfather        Type 2  . Hearing loss Son     Social History: Social History   Socioeconomic History  . Marital status: Married    Spouse name: Cliffton Mathis Bud  . Number of children: 2  . Years of education: 64  . Highest education level: 12th grade  Occupational History  . Occupation: retired  Scientific laboratory technician  . Financial resource strain: Not hard at all  . Food insecurity    Worry: Never true    Inability: Never true  . Transportation needs    Medical: No    Non-medical: No  Tobacco Use  . Smoking status: Former Smoker    Packs/day: 0.50    Years: 5.00    Pack years: 2.50    Quit date: 04/24/2004    Years since quitting: 14.5  . Smokeless tobacco: Never Used  Substance and Sexual Activity  . Alcohol use: No  . Drug use: No  . Sexual activity: Yes  Lifestyle  . Physical activity    Days per week: Not on file    Minutes per session: Not on file  . Stress: Only a little  Relationships  . Social Herbalist on phone: Not on file    Gets together: Not on file    Attends religious service: Not on file    Active member of club or organization: Not on file    Attends meetings of clubs or organizations: Not on file    Relationship status: Not on file  . Intimate partner violence    Fear of current or ex partner: No    Emotionally abused: No    Physically abused: No    Forced sexual activity: No  Other Topics Concern  . Not on file  Social History Narrative  . Not on file    Vital Signs: Blood pressure (!) 150/60, pulse 70, resp. rate 16, height 5\' 6"  (1.676 m), weight 176 lb (79.8 kg), SpO2 98 %.  Examination: General Appearance: The patient is well-developed, well-nourished, and in no distress. Skin: Gross inspection of skin unremarkable. Head: normocephalic, no gross deformities. Eyes: no gross  deformities noted. ENT: ears appear grossly normal no exudates. Neck: Supple. No thyromegaly. No LAD. Respiratory: clear bilateraly. Cardiovascular: Normal S1 and S2 without murmur or rub. Extremities: No cyanosis. pulses are equal. Neurologic: Alert and oriented. No involuntary movements.  LABS: No results found for this or any previous visit (from the past 2160 hour(s)).  Radiology: No results found.  No results found.  No results found.    Assessment and Plan: Patient Active Problem List   Diagnosis Date Noted  . Aortic atherosclerosis ([place]) 10/26/2016  . Claudication ([place]) 10/26/2016  . COPD (chronic obstructive pulmonary disease) ([place]) 10/22/2016  . OSA on CPAP 09/16/2015  .  Abnormal chest x-ray 01/25/2015  . Atrophic vaginitis 01/25/2015  . Atherosclerosis of coronary artery 01/25/2015  . Chronic kidney disease (CKD), stage III (moderate) ([place]) 01/25/2015  . Bloodgood disease 01/25/2015  . Blood glucose elevated 01/25/2015  . Hyperlipidemia 01/25/2015  . Cannot sleep 01/25/2015  . Hypertension 11/02/2014  . Acid reflux 11/02/2014  . Arthritis 10/19/2014  . Adenomatous colon polyp 07/27/2013  . Gastric catarrh 07/27/2013  . Bergmann's syndrome 07/27/2013  . Barrett esophagus 07/07/2013  . Osteopenia 12/13/2009  . PAD (peripheral artery disease) ([place]) 09/24/2007  . DD (diverticular disease) 12/19/2005    1. OSA on CPAP Well controlled on CPAP.  Will continue present therapy. She has good compliance at this time.   2. Chronic obstructive pulmonary disease, unspecified COPD type ([place]) Stable, continue present management.   3. Gastroesophageal reflux disease without esophagitis Continue to use Protonix as directed.   4. SOB (shortness of breath) - Spirometry with Graph  General Counseling: I have discussed the findings of the evaluation and examination with Mardene Celeste.  I have also discussed any further diagnostic evaluation thatmay be needed or ordered  today. Shaelie verbalizes understanding of the findings of todays visit. We also reviewed her medications today and discussed drug interactions and side effects including but not limited excessive drowsiness and altered mental states. We also discussed that there is always a risk not just to her but also people around her. she has been encouraged to call the office with any questions or concerns that should arise related to todays visit.    Time spent: 25 This patient was seen by Orson Gear AGNP-C in Collaboration with Dr. Devona Konig as a part of collaborative care agreement.   I have personally obtained a history, examined the patient, evaluated laboratory and imaging results, formulated the assessment and plan and placed orders.    Allyne Gee, MD [facility] Pulmonary and Critical Care Sleep medicine

## 2018-10-29 ENCOUNTER — Other Ambulatory Visit: Payer: Self-pay | Admitting: Physician Assistant

## 2018-10-29 DIAGNOSIS — I10 Essential (primary) hypertension: Secondary | ICD-10-CM

## 2018-12-10 ENCOUNTER — Other Ambulatory Visit: Payer: Self-pay

## 2018-12-10 ENCOUNTER — Ambulatory Visit (INDEPENDENT_AMBULATORY_CARE_PROVIDER_SITE_OTHER): Payer: Medicare Other

## 2018-12-10 DIAGNOSIS — Z9989 Dependence on other enabling machines and devices: Secondary | ICD-10-CM | POA: Diagnosis not present

## 2018-12-10 DIAGNOSIS — G4733 Obstructive sleep apnea (adult) (pediatric): Secondary | ICD-10-CM

## 2018-12-10 NOTE — Progress Notes (Signed)
95 percentile pressure 12   95th percentile leak 64.5   apnea index 1.2 /hr  apnea-hypopnea index  2.4 /hr   total days used  >4 hr 90 days  total days used <4 hr 0 days  Total compliance 100 percent  She is doing great no problems or questions at this time

## 2018-12-30 ENCOUNTER — Telehealth: Payer: Self-pay

## 2018-12-30 NOTE — Telephone Encounter (Signed)
Called lmom informing patient of appointment. klh 

## 2019-01-05 ENCOUNTER — Encounter (INDEPENDENT_AMBULATORY_CARE_PROVIDER_SITE_OTHER): Payer: Self-pay

## 2019-01-05 ENCOUNTER — Other Ambulatory Visit: Payer: Self-pay

## 2019-01-05 ENCOUNTER — Encounter: Payer: Self-pay | Admitting: Internal Medicine

## 2019-01-05 ENCOUNTER — Ambulatory Visit: Payer: Medicare Other | Admitting: Internal Medicine

## 2019-01-05 VITALS — BP 160/89 | HR 84 | Resp 16 | Ht 65.0 in | Wt 176.0 lb

## 2019-01-05 DIAGNOSIS — J449 Chronic obstructive pulmonary disease, unspecified: Secondary | ICD-10-CM | POA: Diagnosis not present

## 2019-01-05 DIAGNOSIS — I2583 Coronary atherosclerosis due to lipid rich plaque: Secondary | ICD-10-CM

## 2019-01-05 DIAGNOSIS — I2510 Atherosclerotic heart disease of native coronary artery without angina pectoris: Secondary | ICD-10-CM

## 2019-01-05 DIAGNOSIS — K219 Gastro-esophageal reflux disease without esophagitis: Secondary | ICD-10-CM | POA: Diagnosis not present

## 2019-01-05 DIAGNOSIS — G4700 Insomnia, unspecified: Secondary | ICD-10-CM

## 2019-01-05 DIAGNOSIS — G4733 Obstructive sleep apnea (adult) (pediatric): Secondary | ICD-10-CM | POA: Diagnosis not present

## 2019-01-05 NOTE — Progress Notes (Signed)
[facility] [place], [place] [address]  Pulmonary Sleep Medicine   Office Visit Note  Patient Name: Christina [name] DOB: October 17, 1935 MRN [id number]  Date of Service: 01/05/2019  Complaints/HPI: Patient is doing relatively well.  She has been using her CPAP as prescribed.  I did review her download she has 100% compliance.  She did complain about having some insomnia which is not related to the CPAP device.  She states that she does have periods where she ruminates and she watches television at nighttime.  She feels that she has racing thoughts that she is trying to sleep.  I discussed ways that she can improve her sleep.  I also did offer to give her medications but she wanted to stay away from doing any new medications to help her sleep.  The patient states that she has no headaches no dizziness.  She was also questioned about possibility of depression and she denied this.  She does state that she has a lot of anxiety related to the upcoming holidays  ROS  General: (-) fever, (-) chills, (-) night sweats, (-) weakness Skin: (-) rashes, (-) itching,. Eyes: (-) visual changes, (-) redness, (-) itching. Nose and Sinuses: (-) nasal stuffiness or itchiness, (-) postnasal drip, (-) nosebleeds, (-) sinus trouble. Mouth and Throat: (-) sore throat, (-) hoarseness. Neck: (-) swollen glands, (-) enlarged thyroid, (-) neck pain. Respiratory: - cough, (-) bloody sputum, + shortness of breath, - wheezing. Cardiovascular: - ankle swelling, (-) chest pain. Lymphatic: (-) lymph node enlargement. Neurologic: (-) numbness, (-) tingling. Psychiatric: (-) anxiety, (-) depression   Current Medication: Outpatient Encounter Medications as of 01/05/2019  Medication Sig  . acetaminophen (TYLENOL) 500 MG tablet Take 500 mg by mouth every 8 (eight) hours as needed for mild pain or moderate pain.   Marland Kitchen albuterol (VENTOLIN HFA) 108 (90 Base) MCG/ACT inhaler Inhale 2 puffs into the  lungs every 6 (six) hours as needed for wheezing or shortness of breath.  . Ascorbic Acid (VITAMIN C) 1000 MG tablet Take 1,000 mg by mouth daily.  Marland Kitchen aspirin 81 MG tablet Take 81 mg by mouth daily.   . budesonide-formoterol (SYMBICORT) 160-4.5 MCG/ACT inhaler INHALE 1 PUFF BY MOUTH TWICE A DAY  . Calcium Carb-Cholecalciferol (CALCIUM 600+D3 PO) Take 1 tablet by mouth daily.  Marland Kitchen docusate sodium (COLACE) 100 MG capsule Take 100 mg by mouth at bedtime.  . metoprolol tartrate (LOPRESSOR) 25 MG tablet TAKE 1 TABLET BY MOUTH TWICE A DAY  . Multiple Vitamin (MULTI-VITAMIN DAILY PO) Take by mouth.  . Multiple Vitamins-Minerals (VITAMIN D3 COMPLETE PO) Take by mouth.  . pantoprazole (PROTONIX) 40 MG tablet Take 40 mg by mouth 2 (two) times daily.  . simvastatin (ZOCOR) 40 MG tablet TAKE 1 TABLET BY MOUTH EVERYDAY AT BEDTIME  . tobramycin (TOBREX) 0.3 % ophthalmic solution INSTILL 1 DROP IN LEFT EYE 4 TIMES/DAY BEGIN 1 DAY BEFORE TREATMENT DAY OF TREATMENT AND 1 DAY AFTER  . ranitidine (ZANTAC) 300 MG tablet Take by mouth.   No facility-administered encounter medications on file as of 01/05/2019.     Surgical History: Past Surgical History:  Procedure Laterality Date  . ABDOMINAL HYSTERECTOMY  1980   due to dysfunctional uterine bleeding  . APPENDECTOMY  1980  . BREAST SURGERY Left 2000   biopsy  . CARDIAC CATHETERIZATION    . COLONOSCOPY    . CORONARY ARTERY BYPASS GRAFT  2006  . HEMORRHOID SURGERY    . LOWER EXTREMITY ANGIOGRAPHY  Right 02/25/2017   Procedure: LOWER EXTREMITY ANGIOGRAPHY;  Surgeon: Algernon Huxley, MD;  Location: [facility];  Service: Cardiovascular;  Laterality: Right;  . LOWER EXTREMITY INTERVENTION  02/25/2017   Procedure: LOWER EXTREMITY INTERVENTION;  Surgeon: Algernon Huxley, MD;  Location: [facility];  Service: Cardiovascular;;  . LUNG BIOPSY  1999   Negative  . TONSILLECTOMY      Medical History: Past Medical History:  Diagnosis Date  . Arthritis    . COPD (chronic obstructive pulmonary disease) ([place])   . Coronary artery disease   . GERD (gastroesophageal reflux disease)   . Hyperlipidemia   . Hypertension   . Macular degeneration   . PAD (peripheral artery disease) ([place])   . Sleep apnea     Family History: Family History  Problem Relation Age of Onset  . Hypertension Mother   . Hyperlipidemia Mother   . Alzheimer's disease Mother   . CAD Mother   . Heart attack Father   . Lung disease Sister   . Heart disease Sister   . Diabetes Paternal Grandfather        Type 2  . Hearing loss Son     Social History: Social History   Socioeconomic History  . Marital status: Married    Spouse name: Cliffton Mathis Bud  . Number of children: 2  . Years of education: 88  . Highest education level: 12th grade  Occupational History  . Occupation: retired  Scientific laboratory technician  . Financial resource strain: Not hard at all  . Food insecurity    Worry: Never true    Inability: Never true  . Transportation needs    Medical: No    Non-medical: No  Tobacco Use  . Smoking status: Former Smoker    Packs/day: 0.50    Years: 5.00    Pack years: 2.50    Quit date: 04/24/2004    Years since quitting: 14.7  . Smokeless tobacco: Never Used  Substance and Sexual Activity  . Alcohol use: No  . Drug use: No  . Sexual activity: Yes  Lifestyle  . Physical activity    Days per week: Not on file    Minutes per session: Not on file  . Stress: Only a little  Relationships  . Social Herbalist on phone: Not on file    Gets together: Not on file    Attends religious service: Not on file    Active member of club or organization: Not on file    Attends meetings of clubs or organizations: Not on file    Relationship status: Not on file  . Intimate partner violence    Fear of current or ex partner: No    Emotionally abused: No    Physically abused: No    Forced sexual activity: No  Other Topics Concern  . Not on file  Social  History Narrative  . Not on file    Vital Signs: Blood pressure (!) 160/89, pulse 84, resp. rate 16, height 5\' 5"  (1.651 m), weight 176 lb (79.8 kg), SpO2 95 %.  Examination: General Appearance: The patient is well-developed, well-nourished, and in no distress. Skin: Gross inspection of skin unremarkable. Head: normocephalic, no gross deformities. Eyes: no gross deformities noted. ENT: ears appear grossly normal no exudates. Neck: Supple. No thyromegaly. No LAD. Respiratory: few rhonchi noted. Cardiovascular: Normal S1 and S2 without murmur or rub. Extremities: No cyanosis. pulses are equal. Neurologic: Alert and oriented. No  involuntary movements.  LABS: No results found for this or any previous visit (from the past 2160 hour(s)).  Radiology: No results found.  No results found.  No results found.    Assessment and Plan: Patient Active Problem List   Diagnosis Date Noted  . Aortic atherosclerosis ([place]) 10/26/2016  . Claudication ([place]) 10/26/2016  . COPD (chronic obstructive pulmonary disease) ([place]) 10/22/2016  . OSA on CPAP 09/16/2015  . Abnormal chest x-ray 01/25/2015  . Atrophic vaginitis 01/25/2015  . Atherosclerosis of coronary artery 01/25/2015  . Chronic kidney disease (CKD), stage III (moderate) 01/25/2015  . Bloodgood disease 01/25/2015  . Blood glucose elevated 01/25/2015  . Hyperlipidemia 01/25/2015  . Cannot sleep 01/25/2015  . Hypertension 11/02/2014  . Acid reflux 11/02/2014  . Arthritis 10/19/2014  . Adenomatous colon polyp 07/27/2013  . Gastric catarrh 07/27/2013  . Bergmann's syndrome 07/27/2013  . Barrett esophagus 07/07/2013  . Osteopenia 12/13/2009  . PAD (peripheral artery disease) ([place]) 09/24/2007  . DD (diverticular disease) 12/19/2005    1. OSA on CPAP therapy compliance is 100%.  Plan is to continue with CPAP on the current settings 2. Insomnia has more of a psychological component.  Offered to give her prescription for trazodone in  the setting of the possibility of underlying depression anxiety playing a role she right now wants to hold off on any medications 3. COPD reviewed her pulmonary functions she is doing well with Symbicort plan is to continue with inhalers as prescribed 4. GERD controlled continue with supportive care no active heartburn noted 5. CAD stable at this time we will continue with clinical monitoring.  General Counseling: I have discussed the findings of the evaluation and examination with Mardene Celeste.  I have also discussed any further diagnostic evaluation thatmay be needed or ordered today. Charmelle verbalizes understanding of the findings of todays visit. We also reviewed her medications today and discussed drug interactions and side effects including but not limited excessive drowsiness and altered mental states. We also discussed that there is always a risk not just to her but also people around her. she has been encouraged to call the office with any questions or concerns that should arise related to todays visit.  No orders of the defined types were placed in this encounter.    Time spent: 76min  I have personally obtained a history, examined the patient, evaluated laboratory and imaging results, formulated the assessment and plan and placed orders.    Allyne Gee, MD [facility] Pulmonary and Critical Care Sleep medicine

## 2019-01-05 NOTE — Patient Instructions (Signed)

## 2019-01-25 ENCOUNTER — Other Ambulatory Visit: Payer: Self-pay | Admitting: Physician Assistant

## 2019-01-25 DIAGNOSIS — E7800 Pure hypercholesterolemia, unspecified: Secondary | ICD-10-CM

## 2019-01-26 LAB — HM MAMMOGRAPHY

## 2019-01-28 ENCOUNTER — Encounter: Payer: Self-pay | Admitting: Physician Assistant

## 2019-02-19 NOTE — Progress Notes (Signed)
Patient: Christina [name], Female    DOB: 09-13-1935, 84 y.o.   MRN: [id number] Visit Date: 02/20/2019  Today's Provider: Mar Daring, PA-C   Chief Complaint  Patient presents with  . Annual Exam   Subjective:     Complete Physical Christina [name] is a 84 y.o. female. She feels well. She reports exercising cleaning the house. She reports she is sleeping poorly.   ------------------------------------------------------- Colonoscopy - 2015 Normal   Review of Systems  Constitutional: Positive for activity change.  HENT: Positive for congestion, sneezing and trouble swallowing.   Eyes: Positive for photophobia and discharge.  Respiratory: Positive for apnea and shortness of breath.   Cardiovascular: Positive for leg swelling.  Gastrointestinal: Negative.   Endocrine: Negative.   Genitourinary: Positive for frequency.  Musculoskeletal: Positive for arthralgias and joint swelling.  Skin: Positive for color change.  Allergic/Immunologic: Negative.   Neurological: Negative.   Hematological: Bruises/bleeds easily.  Psychiatric/Behavioral: Positive for agitation and sleep disturbance.    Social History   Socioeconomic History  . Marital status: Married    Spouse name: Cliffton Mathis Bud  . Number of children: 2  . Years of education: 22  . Highest education level: 12th grade  Occupational History  . Occupation: retired  Tobacco Use  . Smoking status: Former Smoker    Packs/day: 0.50    Years: 5.00    Pack years: 2.50    Quit date: 04/24/2004    Years since quitting: 14.8  . Smokeless tobacco: Never Used  Substance and Sexual Activity  . Alcohol use: No  . Drug use: No  . Sexual activity: Yes  Other Topics Concern  . Not on file  Social History Narrative  . Not on file   Social Determinants of Health   Financial Resource Strain:   . Difficulty of Paying Living Expenses: Not on file  Food Insecurity:   . Worried About Charity fundraiser  in the Last Year: Not on file  . Ran Out of Food in the Last Year: Not on file  Transportation Needs:   . Lack of Transportation (Medical): Not on file  . Lack of Transportation (Non-Medical): Not on file  Physical Activity:   . Days of Exercise per Week: Not on file  . Minutes of Exercise per Session: Not on file  Stress:   . Feeling of Stress : Not on file  Social Connections:   . Frequency of Communication with Friends and Family: Not on file  . Frequency of Social Gatherings with Friends and Family: Not on file  . Attends Religious Services: Not on file  . Active Member of Clubs or Organizations: Not on file  . Attends Archivist Meetings: Not on file  . Marital Status: Not on file  Intimate Partner Violence:   . Fear of Current or Ex-Partner: Not on file  . Emotionally Abused: Not on file  . Physically Abused: Not on file  . Sexually Abused: Not on file    Past Medical History:  Diagnosis Date  . Arthritis   . COPD (chronic obstructive pulmonary disease) ([place])   . Coronary artery disease   . GERD (gastroesophageal reflux disease)   . Hyperlipidemia   . Hypertension   . Macular degeneration   . PAD (peripheral artery disease) ([place])   . Sleep apnea      Patient Active Problem List   Diagnosis Date Noted  . Aortic atherosclerosis ([place]) 10/26/2016  . Claudication (  [place]) 10/26/2016  . COPD (chronic obstructive pulmonary disease) ([place]) 10/22/2016  . OSA on CPAP 09/16/2015  . Abnormal chest x-ray 01/25/2015  . Atrophic vaginitis 01/25/2015  . Atherosclerosis of coronary artery 01/25/2015  . Chronic kidney disease (CKD), stage III (moderate) 01/25/2015  . Bloodgood disease 01/25/2015  . Blood glucose elevated 01/25/2015  . Hyperlipidemia 01/25/2015  . Cannot sleep 01/25/2015  . Hypertension 11/02/2014  . Acid reflux 11/02/2014  . Arthritis 10/19/2014  . Adenomatous colon polyp 07/27/2013  . Gastric catarrh 07/27/2013  . Bergmann's syndrome 07/27/2013  .  Barrett esophagus 07/07/2013  . Osteopenia 12/13/2009  . PAD (peripheral artery disease) ([place]) 09/24/2007  . DD (diverticular disease) 12/19/2005    Past Surgical History:  Procedure Laterality Date  . ABDOMINAL HYSTERECTOMY  1980   due to dysfunctional uterine bleeding  . APPENDECTOMY  1980  . BREAST SURGERY Left 2000   biopsy  . CARDIAC CATHETERIZATION    . COLONOSCOPY    . CORONARY ARTERY BYPASS GRAFT  2006  . HEMORRHOID SURGERY    . LOWER EXTREMITY ANGIOGRAPHY Right 02/25/2017   Procedure: LOWER EXTREMITY ANGIOGRAPHY;  Surgeon: Algernon Huxley, MD;  Location: [facility];  Service: Cardiovascular;  Laterality: Right;  . LOWER EXTREMITY INTERVENTION  02/25/2017   Procedure: LOWER EXTREMITY INTERVENTION;  Surgeon: Algernon Huxley, MD;  Location: [facility];  Service: Cardiovascular;;  . LUNG BIOPSY  1999   Negative  . TONSILLECTOMY      Her family history includes Alzheimer's disease in her mother; CAD in her mother; Diabetes in her paternal grandfather; Hearing loss in her son; Heart attack in her father; Heart disease in her sister; Hyperlipidemia in her mother; Hypertension in her mother; Lung disease in her sister.   Current Outpatient Medications:  .  acetaminophen (TYLENOL) 500 MG tablet, Take 500 mg by mouth every 8 (eight) hours as needed for mild pain or moderate pain. , Disp: , Rfl:  .  albuterol (VENTOLIN HFA) 108 (90 Base) MCG/ACT inhaler, Inhale 2 puffs into the lungs every 6 (six) hours as needed for wheezing or shortness of breath., Disp: 18 g, Rfl: 3 .  Ascorbic Acid (VITAMIN C) 1000 MG tablet, Take 1,000 mg by mouth daily., Disp: , Rfl:  .  aspirin 81 MG tablet, Take 81 mg by mouth daily. , Disp: , Rfl:  .  budesonide-formoterol (SYMBICORT) 160-4.5 MCG/ACT inhaler, INHALE 1 PUFF BY MOUTH TWICE A DAY, Disp: 10.2 Inhaler, Rfl: 3 .  Calcium Carb-Cholecalciferol (CALCIUM 600+D3 PO), Take 1 tablet by mouth daily., Disp: , Rfl:  .  docusate sodium (COLACE)  100 MG capsule, Take 100 mg by mouth at bedtime., Disp: , Rfl:  .  metoprolol tartrate (LOPRESSOR) 25 MG tablet, TAKE 1 TABLET BY MOUTH TWICE A DAY, Disp: 180 tablet, Rfl: 1 .  Multiple Vitamin (MULTI-VITAMIN DAILY PO), Take by mouth., Disp: , Rfl:  .  Multiple Vitamins-Minerals (VITAMIN D3 COMPLETE PO), Take by mouth., Disp: , Rfl:  .  pantoprazole (PROTONIX) 40 MG tablet, Take 40 mg by mouth 2 (two) times daily., Disp: , Rfl:  .  ranitidine (ZANTAC) 300 MG tablet, Take by mouth., Disp: , Rfl:  .  simvastatin (ZOCOR) 40 MG tablet, TAKE 1 TABLET BY MOUTH EVERYDAY AT BEDTIME, Disp: 90 tablet, Rfl: 1 .  tobramycin (TOBREX) 0.3 % ophthalmic solution, INSTILL 1 DROP IN LEFT EYE 4 TIMES/DAY BEGIN 1 DAY BEFORE TREATMENT DAY OF TREATMENT AND 1 DAY AFTER, Disp: , Rfl: 5  Patient Care Team: Mar Daring, PA-C as PCP - General (Family Medicine) [name] as Consulting Physician (Pulmonary Disease) [name], MD as Consulting Physician (Ophthalmology)     Objective:    Vitals: BP (!) 182/75   Pulse 87   Temp (!) 97.2 F (36.2 C) (Temporal)   Resp 18   Ht 5\' 6"  (1.676 m)   Wt 173 lb (78.5 kg)   BMI 27.92 kg/m   Physical Exam  Activities of Daily Living In your present state of health, do you have any difficulty performing the following activities: 02/20/2019  Hearing? N  Vision? Y  Difficulty concentrating or making decisions? Y  Walking or climbing stairs? Y  Dressing or bathing? Y  Doing errands, shopping? N  Some recent data might be hidden    Fall Risk Assessment Fall Risk  02/20/2019 06/23/2018 02/17/2018 01/24/2017 01/27/2016  Falls in the past year? 0 0 0 No No  Number falls in past yr: 0 - - - -  Injury with Fall? 0 - - - -  Follow up Falls evaluation completed - - - -     Depression Screen PHQ 2/9 Scores 02/20/2019 06/23/2018 02/17/2018 01/24/2017  PHQ - 2 Score 0 0 0 2  PHQ- 9 Score 5 - 4 4    No flowsheet data found.     Assessment & Plan:    Annual  Physical Reviewed patient's Family Medical History Reviewed and updated list of patient's medical providers Assessment of cognitive impairment was done Assessed patient's functional ability Established a written schedule for health screening [place] Completed and Reviewed  Exercise Activities and Dietary recommendations Goals    . DIET - INCREASE WATER INTAKE     Recommend increasing water intake to 6-8 glasses of water a day.     . Exercise 150 minutes per week (moderate activity)       Immunization History  Administered Date(s) Administered  . Fluad Quad(high Dose 65+) 10/20/2018  . Influenza Split 12/01/2005, 11/17/2007  . Influenza, High Dose Seasonal PF 10/25/2016  . Influenza-Unspecified 10/06/2013, 10/07/2015, 10/25/2016, 10/28/2017  . Pneumococcal Conjugate-13 12/28/2013  . Pneumococcal Polysaccharide-23 05/31/2003  . Zoster 03/17/2008    Health Maintenance  Topic Date Due  . TETANUS/TDAP  10/15/1956  . INFLUENZA VACCINE  Completed  . DEXA SCAN  Completed  . PNA vac Low Risk Adult  Completed     Discussed health benefits of physical activity, and encouraged her to engage in regular exercise appropriate for her age and condition.    1. Medicare annual wellness visit, subsequent Normal exam today. Up to date on screenings and vaccinations.   2. Essential hypertension Stable. Followed also by Cardiology. Continue Metoprolol 25 mg BID. Will check labs as below and f/u pending results. - CBC w/Diff/Platelet - Comprehensive Metabolic Panel (CMET) - TSH - Lipid Profile - HgB A1c  3. OSA on CPAP Uses nightly. Followed by Pulmonology, Dr. Humphrey Rolls. Will check labs as below and f/u pending results. - CBC w/Diff/Platelet - Comprehensive Metabolic Panel (CMET) - TSH - Lipid Profile - HgB A1c  4. Stage 3 chronic kidney disease, stage 3a Stable. Will check labs as below and f/u pending results. - CBC w/Diff/Platelet - Comprehensive Metabolic  Panel (CMET) - TSH - Lipid Profile - HgB A1c  5. Blood glucose elevated Diet controlled. Will check labs as below and f/u pending results. - CBC w/Diff/Platelet - Comprehensive Metabolic Panel (CMET) - TSH - Lipid Profile - HgB A1c  6.  Pure hypercholesterolemia Stable. Continue Simvastatin 40mg . Will check labs as below and f/u pending results. - CBC w/Diff/Platelet - Comprehensive Metabolic Panel (CMET) - TSH - Lipid Profile - HgB A1c  7. Chronic obstructive pulmonary disease, unspecified COPD type ([place]) Stable. Followed by Pulmonology.  8. Aortic atherosclerosis ([facility]) Stable. On Simvastatin. Followed by Cardiology.  ------------------------------------------------------------------------------------------------------------    Mar Daring, PA-C  [facility]

## 2019-02-20 ENCOUNTER — Other Ambulatory Visit: Payer: Self-pay

## 2019-02-20 ENCOUNTER — Ambulatory Visit (INDEPENDENT_AMBULATORY_CARE_PROVIDER_SITE_OTHER): Payer: Medicare Other | Admitting: Physician Assistant

## 2019-02-20 ENCOUNTER — Encounter: Payer: Self-pay | Admitting: Physician Assistant

## 2019-02-20 VITALS — BP 182/75 | HR 87 | Temp 97.2°F | Resp 18 | Ht 66.0 in | Wt 173.0 lb

## 2019-02-20 DIAGNOSIS — N1831 Chronic kidney disease, stage 3a: Secondary | ICD-10-CM | POA: Diagnosis not present

## 2019-02-20 DIAGNOSIS — E7800 Pure hypercholesterolemia, unspecified: Secondary | ICD-10-CM

## 2019-02-20 DIAGNOSIS — N183 Chronic kidney disease, stage 3 unspecified: Secondary | ICD-10-CM | POA: Diagnosis not present

## 2019-02-20 DIAGNOSIS — Z0000 Encounter for general adult medical examination without abnormal findings: Secondary | ICD-10-CM

## 2019-02-20 DIAGNOSIS — G4733 Obstructive sleep apnea (adult) (pediatric): Secondary | ICD-10-CM | POA: Diagnosis not present

## 2019-02-20 DIAGNOSIS — Z9989 Dependence on other enabling machines and devices: Secondary | ICD-10-CM

## 2019-02-20 DIAGNOSIS — I700 Atherosclerosis of aorta: Secondary | ICD-10-CM

## 2019-02-20 DIAGNOSIS — R739 Hyperglycemia, unspecified: Secondary | ICD-10-CM | POA: Diagnosis not present

## 2019-02-20 DIAGNOSIS — I10 Essential (primary) hypertension: Secondary | ICD-10-CM

## 2019-02-20 DIAGNOSIS — J449 Chronic obstructive pulmonary disease, unspecified: Secondary | ICD-10-CM

## 2019-02-20 NOTE — Patient Instructions (Signed)
Health Maintenance After Age 82 After age 82, you are at a higher risk for certain long-term diseases and infections as well as injuries from falls. Falls are a major cause of broken bones and head injuries in people who are older than age 82. Getting regular preventive care can help to keep you healthy and well. Preventive care includes getting regular testing and making lifestyle changes as recommended by your health care provider. Talk with your health care provider about:  Which screenings and tests you should have. A screening is a test that checks for a disease when you have no symptoms.  A diet and exercise plan that is right for you. What should I know about screenings and tests to prevent falls? Screening and testing are the best ways to find a health problem early. Early diagnosis and treatment give you the best chance of managing medical conditions that are common after age 82. Certain conditions and lifestyle choices may make you more likely to have a fall. Your health care provider may recommend:  Regular vision checks. Poor vision and conditions such as cataracts can make you more likely to have a fall. If you wear glasses, make sure to get your prescription updated if your vision changes.  Medicine review. Work with your health care provider to regularly review all of the medicines you are taking, including over-the-counter medicines. Ask your health care provider about any side effects that may make you more likely to have a fall. Tell your health care provider if any medicines that you take make you feel dizzy or sleepy.  Osteoporosis screening. Osteoporosis is a condition that causes the bones to get weaker. This can make the bones weak and cause them to break more easily.  Blood pressure screening. Blood pressure changes and medicines to control blood pressure can make you feel dizzy.  Strength and balance checks. Your health care provider may recommend certain tests to check your  strength and balance while standing, walking, or changing positions.  Foot health exam. Foot pain and numbness, as well as not wearing proper footwear, can make you more likely to have a fall.  Depression screening. You may be more likely to have a fall if you have a fear of falling, feel emotionally low, or feel unable to do activities that you used to do.  Alcohol use screening. Using too much alcohol can affect your balance and may make you more likely to have a fall. What actions can I take to lower my risk of falls? General instructions  Talk with your health care provider about your risks for falling. Tell your health care provider if: ? You fall. Be sure to tell your health care provider about all falls, even ones that seem minor. ? You feel dizzy, sleepy, or off-balance.  Take over-the-counter and prescription medicines only as told by your health care provider. These include any supplements.  Eat a healthy diet and maintain a healthy weight. A healthy diet includes low-fat dairy products, low-fat (lean) meats, and fiber from whole grains, beans, and lots of fruits and vegetables. Home safety  Remove any tripping hazards, such as rugs, cords, and clutter.  Install safety equipment such as grab bars in bathrooms and safety rails on stairs.  Keep rooms and walkways well-lit. Activity   Follow a regular exercise program to stay fit. This will help you maintain your balance. Ask your health care provider what types of exercise are appropriate for you.  If you need a cane or   walker, use it as recommended by your health care provider.  Wear supportive shoes that have nonskid soles. Lifestyle  Do not drink alcohol if your health care provider tells you not to drink.  If you drink alcohol, limit how much you have: ? 0-1 drink a day for women. ? 0-2 drinks a day for men.  Be aware of how much alcohol is in your drink. In the [place] one drink equals one typical bottle of beer (12  oz), one-half glass of wine (5 oz), or one shot of hard liquor (1 oz).  Do not use any products that contain nicotine or tobacco, such as cigarettes and e-cigarettes. If you need help quitting, ask your health care provider. Summary  Having a healthy lifestyle and getting preventive care can help to protect your health and wellness after age 82.  Screening and testing are the best way to find a health problem early and help you avoid having a fall. Early diagnosis and treatment give you the best chance for managing medical conditions that are more common for people who are older than age 82.  Falls are a major cause of broken bones and head injuries in people who are older than age 82. Take precautions to prevent a fall at home.  Work with your health care provider to learn what changes you can make to improve your health and wellness and to prevent falls. This information is not intended to replace advice given to you by your health care provider. Make sure you discuss any questions you have with your health care provider. Document Revised: 05/15/2018 Document Reviewed: 12/05/2016 Elsevier Patient Education  2020 [employer]vier Inc.  

## 2019-02-21 LAB — COMPREHENSIVE METABOLIC PANEL
ALT: 16 IU/L (ref 0–32)
AST: 25 IU/L (ref 0–40)
Albumin/Globulin Ratio: 2.1 (ref 1.2–2.2)
Albumin: 4.4 g/dL (ref 3.6–4.6)
Alkaline Phosphatase: 85 IU/L (ref 39–117)
BUN/Creatinine Ratio: 15 (ref 12–28)
BUN: 17 mg/dL (ref 8–27)
Bilirubin Total: 0.5 mg/dL (ref 0.0–1.2)
CO2: 23 mmol/L (ref 20–29)
Calcium: 9.5 mg/dL (ref 8.7–10.3)
Chloride: 104 mmol/L (ref 96–106)
Creatinine, Ser: 1.11 mg/dL — ABNORMAL HIGH (ref 0.57–1.00)
GFR calc Af Amer: 54 mL/min/{1.73_m2} — ABNORMAL LOW (ref >59)
GFR calc non Af Amer: 47 mL/min/{1.73_m2} — ABNORMAL LOW (ref >59)
Globulin, Total: 2.1 g/dL (ref 1.5–4.5)
Glucose: 96 mg/dL (ref 65–99)
Potassium: 4.3 mmol/L (ref 3.5–5.2)
Sodium: 142 mmol/L (ref 134–144)
Total Protein: 6.5 g/dL (ref 6.0–8.5)

## 2019-02-21 LAB — LIPID PANEL
Chol/HDL Ratio: 3 ratio (ref 0.0–4.4)
Cholesterol, Total: 164 mg/dL (ref 100–199)
HDL: 54 mg/dL (ref >39)
LDL Chol Calc (NIH): 73 mg/dL (ref 0–99)
Triglycerides: 227 mg/dL — ABNORMAL HIGH (ref 0–149)
VLDL Cholesterol Cal: 37 mg/dL (ref 5–40)

## 2019-02-21 LAB — CBC WITH DIFFERENTIAL/PLATELET
Basophils Absolute: 0 10*3/uL (ref 0.0–0.2)
Basos: 0 %
EOS (ABSOLUTE): 0.1 10*3/uL (ref 0.0–0.4)
Eos: 2 %
Hematocrit: 39.1 % (ref 34.0–46.6)
Hemoglobin: 13.2 g/dL (ref 11.1–15.9)
Immature Grans (Abs): 0 10*3/uL (ref 0.0–0.1)
Immature Granulocytes: 0 %
Lymphocytes Absolute: 1.1 10*3/uL (ref 0.7–3.1)
Lymphs: 17 %
MCH: 34.5 pg — ABNORMAL HIGH (ref 26.6–33.0)
MCHC: 33.8 g/dL (ref 31.5–35.7)
MCV: 102 fL — ABNORMAL HIGH (ref 79–97)
Monocytes Absolute: 0.5 10*3/uL (ref 0.1–0.9)
Monocytes: 8 %
Neutrophils Absolute: 5 10*3/uL (ref 1.4–7.0)
Neutrophils: 73 %
Platelets: 171 10*3/uL (ref 150–450)
RBC: 3.83 x10E6/uL (ref 3.77–5.28)
RDW: 11.8 % (ref 11.7–15.4)
WBC: 6.7 10*3/uL (ref 3.4–10.8)

## 2019-02-21 LAB — HEMOGLOBIN A1C
Est. average glucose Bld gHb Est-mCnc: 120 mg/dL
Hgb A1c MFr Bld: 5.8 % — ABNORMAL HIGH (ref 4.8–5.6)

## 2019-02-21 LAB — TSH: TSH: 3.47 u[IU]/mL (ref 0.450–4.500)

## 2019-02-23 ENCOUNTER — Telehealth: Payer: Self-pay

## 2019-02-23 NOTE — Telephone Encounter (Signed)
-----   Message from Mar Daring, PA-C sent at 02/23/2019  7:28 AM EST ----- Blood count is normal and stable. Kidney function decreased just slightly compared to last check, but is essentially stable. Make sure to stay well hydrated. Liver enzymes are normal. Sodium, potassium and calcium are normal. Thyroid is normal. Cholesterol is normal and stable. Triglycerides were borderline high, but I believe you had not fasted prior to lab work so that is normal. A1c is improved to 5.8 from 5.9.

## 2019-02-23 NOTE — Telephone Encounter (Signed)
Pt advised.   Thanks,   -Calder Oblinger  

## 2019-03-17 ENCOUNTER — Other Ambulatory Visit: Payer: Self-pay | Admitting: Physician Assistant

## 2019-03-17 DIAGNOSIS — K219 Gastro-esophageal reflux disease without esophagitis: Secondary | ICD-10-CM

## 2019-03-17 DIAGNOSIS — H353221 Exudative age-related macular degeneration, left eye, with active choroidal neovascularization: Secondary | ICD-10-CM | POA: Diagnosis not present

## 2019-03-17 DIAGNOSIS — H43813 Vitreous degeneration, bilateral: Secondary | ICD-10-CM | POA: Diagnosis not present

## 2019-03-17 DIAGNOSIS — H35432 Paving stone degeneration of retina, left eye: Secondary | ICD-10-CM | POA: Diagnosis not present

## 2019-03-17 DIAGNOSIS — H353213 Exudative age-related macular degeneration, right eye, with inactive scar: Secondary | ICD-10-CM | POA: Diagnosis not present

## 2019-03-18 NOTE — Telephone Encounter (Signed)
Please review.  I think pt is taking pantoprazole twice a day.   Thanks,   -Mickel Baas

## 2019-03-19 NOTE — Telephone Encounter (Signed)
Can we call to see how she is taking this medication? It was previosuly prescribed by GI and we have BID dosing and they are requesting once daily dosing. Thanks.

## 2019-03-19 NOTE — Telephone Encounter (Signed)
Patient is taking medication once daily before breakfast.

## 2019-04-24 DIAGNOSIS — Z03818 Encounter for observation for suspected exposure to other biological agents ruled out: Secondary | ICD-10-CM | POA: Diagnosis not present

## 2019-04-24 DIAGNOSIS — B9689 Other specified bacterial agents as the cause of diseases classified elsewhere: Secondary | ICD-10-CM | POA: Diagnosis not present

## 2019-04-24 DIAGNOSIS — J019 Acute sinusitis, unspecified: Secondary | ICD-10-CM | POA: Diagnosis not present

## 2019-04-30 ENCOUNTER — Other Ambulatory Visit: Payer: Self-pay | Admitting: Physician Assistant

## 2019-04-30 DIAGNOSIS — I10 Essential (primary) hypertension: Secondary | ICD-10-CM

## 2019-05-22 DIAGNOSIS — G4733 Obstructive sleep apnea (adult) (pediatric): Secondary | ICD-10-CM | POA: Diagnosis not present

## 2019-06-09 DIAGNOSIS — H353213 Exudative age-related macular degeneration, right eye, with inactive scar: Secondary | ICD-10-CM | POA: Diagnosis not present

## 2019-06-09 DIAGNOSIS — H35432 Paving stone degeneration of retina, left eye: Secondary | ICD-10-CM | POA: Diagnosis not present

## 2019-06-09 DIAGNOSIS — H43813 Vitreous degeneration, bilateral: Secondary | ICD-10-CM | POA: Diagnosis not present

## 2019-06-09 DIAGNOSIS — H353221 Exudative age-related macular degeneration, left eye, with active choroidal neovascularization: Secondary | ICD-10-CM | POA: Diagnosis not present

## 2019-06-10 ENCOUNTER — Other Ambulatory Visit: Payer: Self-pay

## 2019-06-10 ENCOUNTER — Ambulatory Visit (INDEPENDENT_AMBULATORY_CARE_PROVIDER_SITE_OTHER): Payer: Medicare Other

## 2019-06-10 DIAGNOSIS — G4733 Obstructive sleep apnea (adult) (pediatric): Secondary | ICD-10-CM | POA: Diagnosis not present

## 2019-06-10 NOTE — Progress Notes (Signed)
95 percentile pressure 12   95th percentile leak 57.5   apnea index 2.3 /hr  apnea-hypopnea index  3.4 /hr   total days used  >4 hr 90 days  total days used <4 hr 0 days  Total compliance 100 percent  She wears cpap great 100% she stated having some issues of feeling like not getting enough air during night and walking. I suggest we do an ono just to see where O2 sats are during night and possibly a 6 min walk. Also she asked about a nebulizer encouraged her if she needed they were a good choice.

## 2019-06-26 ENCOUNTER — Other Ambulatory Visit: Payer: Self-pay | Admitting: Physician Assistant

## 2019-06-26 DIAGNOSIS — E7800 Pure hypercholesterolemia, unspecified: Secondary | ICD-10-CM

## 2019-07-02 ENCOUNTER — Telehealth: Payer: Self-pay

## 2019-07-02 NOTE — Telephone Encounter (Signed)
Confirmed appointment on 07/07/2019 and screened for covid. kl[facility]

## 2019-07-07 ENCOUNTER — Other Ambulatory Visit: Payer: Self-pay

## 2019-07-07 ENCOUNTER — Encounter: Payer: Self-pay | Admitting: Internal Medicine

## 2019-07-07 ENCOUNTER — Ambulatory Visit: Payer: Medicare Other | Admitting: Internal Medicine

## 2019-07-07 VITALS — BP 187/58 | HR 63 | Temp 97.5°F | Resp 16 | Ht 66.0 in | Wt 173.4 lb

## 2019-07-07 DIAGNOSIS — J449 Chronic obstructive pulmonary disease, unspecified: Secondary | ICD-10-CM | POA: Diagnosis not present

## 2019-07-07 DIAGNOSIS — G4733 Obstructive sleep apnea (adult) (pediatric): Secondary | ICD-10-CM

## 2019-07-07 DIAGNOSIS — Z9989 Dependence on other enabling machines and devices: Secondary | ICD-10-CM

## 2019-07-07 DIAGNOSIS — K219 Gastro-esophageal reflux disease without esophagitis: Secondary | ICD-10-CM

## 2019-07-07 DIAGNOSIS — R0602 Shortness of breath: Secondary | ICD-10-CM | POA: Diagnosis not present

## 2019-07-07 DIAGNOSIS — N183 Chronic kidney disease, stage 3 unspecified: Secondary | ICD-10-CM | POA: Diagnosis not present

## 2019-07-07 MED ORDER — TRELEGY ELLIPTA 100-62.5-25 MCG/INH IN AEPB: 1.0000 | INHALATION_SPRAY | Freq: Every day | RESPIRATORY_TRACT | 4 refills | Status: DC

## 2019-07-07 NOTE — Patient Instructions (Signed)
pft  Pulmonary Function Tests Pulmonary function tests (PFTs) are used to measure how well your lungs work, find out what is causing your lung problems, and figure out the best treatment for you. You may have PFTs:  When you have an illness involving the lungs.  To follow changes in your lung function over time if you have a chronic lung disease.  If you are an Nature conservation officer. This checks the effects of being exposed to chemicals over a long period of time.  To check lung function before having surgery or other procedures.  To check your lungs if you smoke.  To check if prescribed medicines or treatments are helping your lungs. Your results will be compared to the expected lung function of someone with healthy lungs who is similar to you in:  Age.  Gender.  Height.  Weight.  Race or ethnicity. This is done to show how your lungs compare to normal lung function (percent predicted). This is how your health care provider knows if your lung function is normal or not. If you have had PFTs done before, your health care provider will compare your current results with past results. This shows if your lung function is better, worse, or the same as before. Tell a health care provider about:  Any allergies you have.  All medicines you are taking, including inhaler or nebulizer medicines, vitamins, herbs, eye drops, creams, and over-the-counter medicines.  Any blood disorders you have.  Any surgeries you have had, especially recent eye surgery, abdominal surgery, or chest surgery. These can make PFTs difficult or unsafe.  Any medical conditions you have, including chest pain or heart problems, tuberculosis, or respiratory infections such as pneumonia, a cold, or the flu.  Any fear of being in closed spaces (claustrophobia). Some of your tests may be in a closed space. What are the risks? Generally, this is a safe procedure. However, problems may occur,  including:  Light-headedness due to over-breathing (hyperventilation).  An asthma attack from deep breathing.  A collapsed lung. What happens before the procedure?  Take over-the-counter and prescription medicines only as told by your health care provider. If you take inhaler or nebulizer medicines, ask your health care provider which medicines you should take on the day of your testing. Some inhaler medicines may interfere with PFTs if they are taken shortly before the tests.  Follow your health care provider's instructions on eating and drinking restrictions. This may include avoiding eating large meals and drinking alcohol before the testing.  Do not use any products that contain nicotine or tobacco, such as cigarettes and e-cigarettes. If you need help quitting, ask your health care provider.  Wear comfortable clothing that will not interfere with breathing. What happens during the procedure?   You will be given a soft nose clip to wear. This is done so all of your breaths will go through your mouth instead of your nose.  You will be given a germ-free (sterile) mouthpiece. It will be attached to a machine that measures your breathing (spirometer).  You will be asked to do various breathing maneuvers. The maneuvers will be done by breathing in (inhaling) and breathing out (exhaling). You may be asked to repeat the maneuvers several times before the testing is done.  It is important to follow the instructions exactly to get accurate results. Make sure to blow as hard and as fast as you can when you are told to do so.  You may be given a medicine that  makes the small air passages in your lungs larger (bronchodilator) after testing has been done. This medicine will make it easier for you to breathe.  The tests will be repeated after the bronchodilator has taken effect.  You will be monitored carefully during the procedure for faintness, dizziness, trouble breathing, or any other  problems. The procedure may vary among health care providers and hospitals. What happens after the procedure?  It is up to you to get your test results. Ask your health care provider, or the department that is doing the tests, when your results will be ready. After you have received your test results, talk with your health care provider about treatment options, if necessary. Summary  Pulmonary function tests (PFTs) are used to measure how well your lungs work, find out what is causing your lung problems, and figure out the best treatment for you.  Wear comfortable clothing that will not interfere with breathing.  It is up to you to get your test results. After you have received them, talk with your health care provider about treatment options, if necessary. This information is not intended to replace advice given to you by your health care provider. Make sure you discuss any questions you have with your health care provider. Document Revised: 01/20/2016 Document Reviewed: 12/15/2015 Elsevier Patient Education  2020 [employer].

## 2019-07-07 NOTE — Progress Notes (Addendum)
[facility] [place], [place] [address]  Pulmonary Sleep Medicine   Office Visit Note  Patient Name: Christina [name] DOB: [birth year] MRN [id number]  Date of Service: 07/07/2019  Complaints/HPI: OSA on CPAP. She states that she is doing well with her CPAP device Using it every night. Good compliance no issues. She does have some sinus issues. She states also she is having some COPD issues with increased SOB. She has been on inhalers in the form of symbicort and proair. She feels that her secretions are a bit more. She notes the sputum is white no yellow or blood. She has not had a recent CXR done. With increased symptoms she will get a new CXR. She may also benefit from using triple inhaler therapy. Her FEV1 has declined based on spiro. Last PFT was 1 year ago  ROS  General: (-) fever, (-) chills, (-) night sweats, (-) weakness Skin: (-) rashes, (-) itching,. Eyes: (-) visual changes, (-) redness, (-) itching. Nose and Sinuses: (-) nasal stuffiness or itchiness, (-) postnasal drip, (-) nosebleeds, (-) sinus trouble. Mouth and Throat: (-) sore throat, (-) hoarseness. Neck: (-) swollen glands, (-) enlarged thyroid, (-) neck pain. Respiratory: - cough, (-) bloody sputum, - shortness of breath, - wheezing. Cardiovascular: - ankle swelling, (-) chest pain. Lymphatic: (-) lymph node enlargement. Neurologic: (-) numbness, (-) tingling. Psychiatric: (-) anxiety, (-) depression   Current Medication: Outpatient Encounter Medications as of 07/07/2019  Medication Sig  . acetaminophen (TYLENOL) 500 MG tablet Take 500 mg by mouth every 8 (eight) hours as needed for mild pain or moderate pain.   Marland Kitchen albuterol (VENTOLIN HFA) 108 (90 Base) MCG/ACT inhaler Inhale 2 puffs into the lungs every 6 (six) hours as needed for wheezing or shortness of breath.  . Ascorbic Acid (VITAMIN C) 1000 MG tablet Take 1,000 mg by mouth daily.  Marland Kitchen aspirin 81 MG tablet Take 81 mg by mouth daily.    . budesonide-formoterol (SYMBICORT) 160-4.5 MCG/ACT inhaler INHALE 1 PUFF BY MOUTH TWICE A DAY  . Calcium Carb-Cholecalciferol (CALCIUM 600+D3 PO) Take 1 tablet by mouth daily.  Marland Kitchen docusate sodium (COLACE) 100 MG capsule Take 100 mg by mouth at bedtime.  . metoprolol tartrate (LOPRESSOR) 25 MG tablet TAKE 1 TABLET BY MOUTH TWICE A DAY  . Multiple Vitamin (MULTI-VITAMIN DAILY PO) Take by mouth.  . Multiple Vitamins-Minerals (VITAMIN D3 COMPLETE PO) Take by mouth.  . pantoprazole (PROTONIX) 40 MG tablet TAKE 1 TABLET (40 MG TOTAL) BY MOUTH ONCE DAILY TAKE 30 MINUTES BEFORE BREAKFAST.  Marland Kitchen simvastatin (ZOCOR) 40 MG tablet TAKE 1 TABLET BY MOUTH EVERYDAY AT BEDTIME  . tobramycin (TOBREX) 0.3 % ophthalmic solution INSTILL 1 DROP IN LEFT EYE 4 TIMES/DAY BEGIN 1 DAY BEFORE TREATMENT DAY OF TREATMENT AND 1 DAY AFTER  . ranitidine (ZANTAC) 300 MG tablet Take by mouth.   No facility-administered encounter medications on file as of 07/07/2019.    Surgical History: Past Surgical History:  Procedure Laterality Date  . ABDOMINAL HYSTERECTOMY  1980   due to dysfunctional uterine bleeding  . APPENDECTOMY  1980  . BREAST SURGERY Left 2000   biopsy  . CARDIAC CATHETERIZATION    . COLONOSCOPY    . CORONARY ARTERY BYPASS GRAFT  2006  . HEMORRHOID SURGERY    . LOWER EXTREMITY ANGIOGRAPHY Right 02/25/2017   Procedure: LOWER EXTREMITY ANGIOGRAPHY;  Surgeon: Algernon Huxley, MD;  Location: [place] [facility];  Service: Cardiovascular;  Laterality: Right;  . LOWER EXTREMITY  INTERVENTION  02/25/2017   Procedure: LOWER EXTREMITY INTERVENTION;  Surgeon: Algernon Huxley, MD;  Location: [place] [facility];  Service: Cardiovascular;;  . LUNG BIOPSY  1999   Negative  . TONSILLECTOMY      Medical History: Past Medical History:  Diagnosis Date  . Arthritis   . COPD (chronic obstructive pulmonary disease) ([place])   . Coronary artery disease   . GERD (gastroesophageal reflux disease)   . Hyperlipidemia   .  Hypertension   . Macular degeneration   . PAD (peripheral artery disease) ([place])   . Sleep apnea     Family History: Family History  Problem Relation Age of Onset  . Hypertension Mother   . Hyperlipidemia Mother   . Alzheimer's disease Mother   . CAD Mother   . Heart attack Father   . Lung disease Sister   . Heart disease Sister   . Diabetes Paternal Grandfather        Type 2  . Hearing loss Son     Social History: Social History   Socioeconomic History  . Marital status: Married    Spouse name: Cliffton Mathis Bud  . Number of children: 2  . Years of education: 38  . Highest education level: 12th grade  Occupational History  . Occupation: retired  Tobacco Use  . Smoking status: Former Smoker    Packs/day: 0.50    Years: 5.00    Pack years: 2.50    Quit date: 04/24/2004    Years since quitting: 15.2  . Smokeless tobacco: Never Used  Substance and Sexual Activity  . Alcohol use: No  . Drug use: No  . Sexual activity: Yes  Other Topics Concern  . Not on file  Social History Narrative  . Not on file   Social Determinants of Health   Financial Resource Strain:   . Difficulty of Paying Living Expenses:   Food Insecurity:   . Worried About Charity fundraiser in the Last Year:   . Arboriculturist in the Last Year:   Transportation Needs:   . Film/video editor (Medical):   Marland Kitchen Lack of Transportation (Non-Medical):   Physical Activity:   . Days of Exercise per Week:   . Minutes of Exercise per Session:   Stress:   . Feeling of Stress :   Social Connections:   . Frequency of Communication with Friends and Family:   . Frequency of Social Gatherings with Friends and Family:   . Attends Religious Services:   . Active Member of Clubs or Organizations:   . Attends Archivist Meetings:   Marland Kitchen Marital Status:   Intimate Partner Violence:   . Fear of Current or Ex-Partner:   . Emotionally Abused:   Marland Kitchen Physically Abused:   . Sexually Abused:      Vital Signs: Blood pressure (!) 187/58, pulse 63, temperature (!) 97.5 F (36.4 C), resp. rate 16, height 5\' 6"  (1.676 m), weight 173 lb 6.4 oz (78.7 kg), SpO2 96 %.  Examination: General Appearance: The patient is well-developed, well-nourished, and in no distress. Skin: Gross inspection of skin unremarkable. Head: normocephalic, no gross deformities. Eyes: no gross deformities noted. ENT: ears appear grossly normal no exudates. Neck: Supple. No thyromegaly. No LAD. Respiratory: no rhonchi are noted. Cardiovascular: Normal S1 and S2 without murmur or rub. Extremities: No cyanosis. pulses are equal. Neurologic: Alert and oriented. No involuntary movements.  LABS: No results found for this or any previous visit (from  the past 2160 hour(s)).  Radiology: No results found.  No results found.  No results found.    Assessment and Plan: Patient Active Problem List   Diagnosis Date Noted  . Aortic atherosclerosis ([place]) 10/26/2016  . Claudication ([place]) 10/26/2016  . COPD (chronic obstructive pulmonary disease) ([place]) 10/22/2016  . OSA on CPAP 09/16/2015  . Abnormal chest x-ray 01/25/2015  . Atrophic vaginitis 01/25/2015  . Atherosclerosis of coronary artery 01/25/2015  . Chronic kidney disease (CKD), stage III (moderate) 01/25/2015  . Bloodgood disease 01/25/2015  . Blood glucose elevated 01/25/2015  . Hyperlipidemia 01/25/2015  . Cannot sleep 01/25/2015  . Hypertension 11/02/2014  . Acid reflux 11/02/2014  . Arthritis 10/19/2014  . Adenomatous colon polyp 07/27/2013  . Gastric catarrh 07/27/2013  . Bergmann's syndrome 07/27/2013  . Barrett esophagus 07/07/2013  . Osteopenia 12/13/2009  . PAD (peripheral artery disease) ([place]) 09/24/2007  . DD (diverticular disease) 12/19/2005    1. COPD based on today's spirometry her FEV1 is actually down significantly. She is also having increasing symptoms. I reviewed her medications she may benefit from addition of a  anticholinergic agent so I gave her samples of Trelegy to use. Patient also will have a prescription sent for the trilogy. In addition to this we will pulmonary function test done to reassess her values 2. OSA with CPAP she is doing well with this has been on the CPAP at home for her OSA and she has good response and good compliance. We will continue to monitor. 3. CKD III her creatinine was 1.11 she does not really drink enough water to suggest to her that she do increase her water intake-he probably should have this followed up with her primary care team 4. GERD this is under control we will continue to monitor closely  General Counseling: I have discussed the findings of the evaluation and examination with Mardene Celeste.  I have also discussed any further diagnostic evaluation thatmay be needed or ordered today. Jelessa verbalizes understanding of the findings of todays visit. We also reviewed her medications today and discussed drug interactions and side effects including but not limited excessive drowsiness and altered mental states. We also discussed that there is always a risk not just to her but also people around her. she has been encouraged to call the office with any questions or concerns that should arise related to todays visit.  Orders Placed This Encounter  Procedures  . DG Chest 2 View    Standing Status:   Future    Standing Expiration Date:   07/06/2020    Order Specific Question:   Reason for Exam (SYMPTOM  OR DIAGNOSIS REQUIRED)    Answer:   COPD    Order Specific Question:   Preferred imaging location?    Answer:   [facility]    Order Specific Question:   Radiology Contrast Protocol - do NOT remove file path    Answer:   [email or web address]  . Spirometry with Graph    Order Specific Question:   Where should this test be performed?    Answer:   [facility]  . Pulmonary function test    Standing Status:   Future    Standing  Expiration Date:   07/06/2020    Order Specific Question:   Where should this test be performed?    Answer:   [facility]     Time spent: 80  I have personally obtained a history, examined the patient, evaluated laboratory and imaging results, formulated  the assessment and plan and placed orders.    Allyne Gee, MD [facility] Pulmonary and Critical Care Sleep medicine

## 2019-07-07 NOTE — Addendum Note (Signed)
Addended by: Devona Konig on: 07/07/2019 03:09 PM   Modules accepted: Orders

## 2019-07-09 ENCOUNTER — Other Ambulatory Visit: Payer: Self-pay

## 2019-07-09 ENCOUNTER — Ambulatory Visit
Admission: RE | Admit: 2019-07-09 | Discharge: 2019-07-09 | Disposition: A | Discharge status: Home / Self Care | Payer: Medicare Other | Source: Ambulatory Visit | Attending: Internal Medicine | Admitting: Internal Medicine

## 2019-07-09 ENCOUNTER — Ambulatory Visit
Admission: RE | Admit: 2019-07-09 | Discharge: 2019-07-09 | Disposition: A | Discharge status: Home / Self Care | Payer: Medicare Other | Attending: Internal Medicine | Admitting: Internal Medicine

## 2019-07-09 DIAGNOSIS — R0602 Shortness of breath: Secondary | ICD-10-CM | POA: Diagnosis not present

## 2019-07-09 DIAGNOSIS — J449 Chronic obstructive pulmonary disease, unspecified: Secondary | ICD-10-CM | POA: Insufficient documentation

## 2019-07-14 ENCOUNTER — Other Ambulatory Visit: Payer: Self-pay

## 2019-07-14 ENCOUNTER — Encounter: Payer: Self-pay | Admitting: Family Medicine

## 2019-07-14 ENCOUNTER — Ambulatory Visit (INDEPENDENT_AMBULATORY_CARE_PROVIDER_SITE_OTHER): Payer: Medicare Other | Admitting: Family Medicine

## 2019-07-14 VITALS — BP 158/72 | HR 60 | Temp 96.6°F | Wt 171.0 lb

## 2019-07-14 DIAGNOSIS — N1831 Chronic kidney disease, stage 3a: Secondary | ICD-10-CM

## 2019-07-14 DIAGNOSIS — R1031 Right lower quadrant pain: Secondary | ICD-10-CM

## 2019-07-14 LAB — POCT URINALYSIS DIPSTICK
Bilirubin, UA: NEGATIVE
Blood, UA: NEGATIVE
Glucose, UA: NEGATIVE
Ketones, UA: NEGATIVE
Leukocytes, UA: NEGATIVE
Nitrite, UA: NEGATIVE
Protein, UA: NEGATIVE
Spec Grav, UA: 1.01 (ref 1.010–1.025)
Urobilinogen, UA: 0.2 E.U./dL
pH, UA: 5 (ref 5.0–8.0)

## 2019-07-14 NOTE — Progress Notes (Signed)
Established patient visit   Patient: ALEASE [name]   DOB: Aug 27, 1935   85 y.o. Female  MRN: [id number] Visit Date: 07/14/2019  Today's healthcare provider: Lavon Paganini, MD   Chief Complaint  Patient presents with  . Urinary Tract Infection   Subjective    Urinary Tract Infection  This is a new problem. The current episode started in the past 7 days. The problem has been gradually worsening. Pertinent negatives include no chills, discharge, flank pain, frequency, hematuria, hesitancy, nausea, possible pregnancy, sweats, urgency or vomiting. She has tried increased fluids for the symptoms.    R mid abd pain pain is better with rest About 1 week in duration Intermittent and aching in nature Drinking lots of water No frequency/urgency Mild dysuria infrequently No hematuria  Takes stool softener every night Feels like she is doing really well with her constipation Having soft BMs 1-2 times daily No pain with eating No fevers  S/p appendectomy and BSO with hysterectomy many years ago No vaginal bleeding/pain/discharge   Patient Active Problem List   Diagnosis Date Noted  . Aortic atherosclerosis ([place]) 10/26/2016  . Claudication ([place]) 10/26/2016  . COPD (chronic obstructive pulmonary disease) ([place]) 10/22/2016  . OSA on CPAP 09/16/2015  . Abnormal chest x-ray 01/25/2015  . Atrophic vaginitis 01/25/2015  . Atherosclerosis of coronary artery 01/25/2015  . Chronic kidney disease (CKD), stage III (moderate) 01/25/2015  . Bloodgood disease 01/25/2015  . Blood glucose elevated 01/25/2015  . Hyperlipidemia 01/25/2015  . Cannot sleep 01/25/2015  . Hypertension 11/02/2014  . Acid reflux 11/02/2014  . Arthritis 10/19/2014  . Adenomatous colon polyp 07/27/2013  . Gastric catarrh 07/27/2013  . Bergmann's syndrome 07/27/2013  . Barrett esophagus 07/07/2013  . Osteopenia 12/13/2009  . PAD (peripheral artery disease) ([place]) 09/24/2007  . DD (diverticular disease)  12/19/2005   Past Medical History:  Diagnosis Date  . Arthritis   . COPD (chronic obstructive pulmonary disease) ([place])   . Coronary artery disease   . GERD (gastroesophageal reflux disease)   . Hyperlipidemia   . Hypertension   . Macular degeneration   . PAD (peripheral artery disease) ([place])   . Sleep apnea    Social History   Tobacco Use  . Smoking status: Former Smoker    Packs/day: 0.50    Years: 5.00    Pack years: 2.50    Quit date: 04/24/2004    Years since quitting: 15.2  . Smokeless tobacco: Never Used  Substance Use Topics  . Alcohol use: No  . Drug use: No   Allergies  Allergen Reactions  . Baclofen Other (See Comments)  . Codeine Nausea Only  . Plavix [Clopidogrel]     brusing all over  . Sulfa Antibiotics Nausea Only  . Latex Rash       Medications: Outpatient Medications Prior to Visit  Medication Sig  . acetaminophen (TYLENOL) 500 MG tablet Take 500 mg by mouth every 8 (eight) hours as needed for mild pain or moderate pain.   Marland Kitchen albuterol (VENTOLIN HFA) 108 (90 Base) MCG/ACT inhaler Inhale 2 puffs into the lungs every 6 (six) hours as needed for wheezing or shortness of breath.  . Ascorbic Acid (VITAMIN C) 1000 MG tablet Take 1,000 mg by mouth daily.  Marland Kitchen aspirin 81 MG tablet Take 81 mg by mouth daily.   . Calcium Carb-Cholecalciferol (CALCIUM 600+D3 PO) Take 1 tablet by mouth daily.  Marland Kitchen docusate sodium (COLACE) 100 MG capsule Take 100 mg by mouth at bedtime.  Marland Kitchen  Fluticasone-Umeclidin-Vilant (TRELEGY ELLIPTA) 100-62.5-25 MCG/INH AEPB Inhale 1 puff into the lungs daily.  . metoprolol tartrate (LOPRESSOR) 25 MG tablet TAKE 1 TABLET BY MOUTH TWICE A DAY  . Multiple Vitamin (MULTI-VITAMIN DAILY PO) Take by mouth.  . Multiple Vitamins-Minerals (VITAMIN D3 COMPLETE PO) Take by mouth.  . pantoprazole (PROTONIX) 40 MG tablet TAKE 1 TABLET (40 MG TOTAL) BY MOUTH ONCE DAILY TAKE 30 MINUTES BEFORE BREAKFAST.  Marland Kitchen simvastatin (ZOCOR) 40 MG tablet TAKE 1 TABLET BY MOUTH  EVERYDAY AT BEDTIME  . tobramycin (TOBREX) 0.3 % ophthalmic solution INSTILL 1 DROP IN LEFT EYE 4 TIMES/DAY BEGIN 1 DAY BEFORE TREATMENT DAY OF TREATMENT AND 1 DAY AFTER  . budesonide-formoterol (SYMBICORT) 160-4.5 MCG/ACT inhaler INHALE 1 PUFF BY MOUTH TWICE A DAY (Patient not taking: Reported on 07/14/2019)  . ranitidine (ZANTAC) 300 MG tablet Take by mouth.   No facility-administered medications prior to visit.    Review of Systems  Constitutional: Negative.  Negative for chills.  Gastrointestinal: Positive for abdominal pain. Negative for abdominal distention, anal bleeding, blood in stool, constipation, diarrhea, nausea, rectal pain and vomiting.  Genitourinary: Negative for difficulty urinating, dysuria, flank pain, frequency, hematuria, hesitancy, urgency, vaginal bleeding, vaginal discharge and vaginal pain.    Last metabolic panel Lab Results  Component Value Date   GLUCOSE 96 02/20/2019   NA 142 02/20/2019   K 4.3 02/20/2019   CL 104 02/20/2019   CO2 23 02/20/2019   BUN 17 02/20/2019   CREATININE 1.11 (H) 02/20/2019   GFRNONAA 47 (L) 02/20/2019   GFRAA 54 (L) 02/20/2019   CALCIUM 9.5 02/20/2019   PROT 6.5 02/20/2019   ALBUMIN 4.4 02/20/2019   LABGLOB 2.1 02/20/2019   AGRATIO 2.1 02/20/2019   BILITOT 0.5 02/20/2019   ALKPHOS 85 02/20/2019   AST 25 02/20/2019   ALT 16 02/20/2019    Objective    BP (!) 158/72 (BP Location: Left Arm, Patient Position: Sitting, Cuff Size: Large)   Pulse 60   Temp (!) 96.6 F (35.9 C) (Temporal)   Wt 171 lb (77.6 kg)   SpO2 99%   BMI 27.60 kg/m    Physical Exam Vitals reviewed.  Constitutional:      General: She is not in acute distress.    Appearance: Normal appearance. She is well-developed. She is not diaphoretic.  HENT:     Head: Normocephalic and atraumatic.  Eyes:     General: No scleral icterus.    Conjunctiva/sclera: Conjunctivae normal.  Neck:     Thyroid: No thyromegaly.  Cardiovascular:     Rate and Rhythm:  Normal rate and regular rhythm.     Pulses: Normal pulses.     Heart sounds: Normal heart sounds. No murmur.  Pulmonary:     Effort: Pulmonary effort is normal. No respiratory distress.     Breath sounds: Normal breath sounds. No wheezing, rhonchi or rales.  Abdominal:     General: Bowel sounds are normal. There is no distension.     Palpations: Abdomen is soft.     Tenderness: There is abdominal tenderness (RLQ). There is no right CVA tenderness, left CVA tenderness, guarding or rebound.  Musculoskeletal:     Cervical back: Neck supple.     Right lower leg: No edema.     Left lower leg: No edema.  Lymphadenopathy:     Cervical: No cervical adenopathy.  Skin:    General: Skin is warm and dry.     Findings: No rash.  Neurological:  Mental Status: She is alert and oriented to person, place, and time. Mental status is at baseline.  Psychiatric:        Mood and Affect: Mood normal.        Behavior: Behavior normal.      Results for orders placed or performed in visit on 07/14/19  POCT urinalysis dipstick  Result Value Ref Range   Color, UA     Clarity, UA     Glucose, UA Negative Negative   Bilirubin, UA Negative    Ketones, UA Negative    Spec Grav, UA 1.010 1.010 - 1.025   Blood, UA Negative    pH, UA 5.0 5.0 - 8.0   Protein, UA Negative Negative   Urobilinogen, UA 0.2 0.2 or 1.0 E.U./dL   Nitrite, UA Negative    Leukocytes, UA Negative Negative   Appearance     Odor      Assessment & Plan     1. Right lower quadrant abdominal pain - new problem x1 wk -Unclear etiology -No rebounding, fever, red flag symptoms -Patient does not have any bowel symptoms, her UA is negative, and she has not any vaginal symptoms -She is status post appendectomy and BSO many years ago -We will obtain CMP and CBC as well as CT abdomen pelvis to evaluate further -Discussed strict return/emergent precautions - POCT urinalysis dipstick - Comprehensive metabolic panel - CBC  w/Diff/Platelet - CT Abdomen Pelvis W Contrast; Future  2. Stage 3a chronic kidney disease - recheck creatinine - Comprehensive metabolic panel   Return if symptoms worsen or fail to improve.      I, Lavon Paganini, MD, have reviewed all documentation for this visit. The documentation on 07/14/19 for the exam, diagnosis, procedures, and orders are all accurate and complete.   Camrin Lapre, Dionne Bucy, MD, MPH [facility]

## 2019-07-14 NOTE — Patient Instructions (Signed)
Abdominal Pain, Adult Pain in the abdomen (abdominal pain) can be caused by many things. Often, abdominal pain is not serious and it gets better with no treatment or by being treated at home. However, sometimes abdominal pain is serious. Your health care provider will ask questions about your medical history and do a physical exam to try to determine the cause of your abdominal pain. Follow these instructions at home:  Medicines  Take over-the-counter and prescription medicines only as told by your health care provider.  Do not take a laxative unless told by your health care provider. General instructions  Watch your condition for any changes.  Drink enough fluid to keep your urine pale yellow.  Keep all follow-up visits as told by your health care provider. This is important. Contact a health care provider if:  Your abdominal pain changes or gets worse.  You are not hungry or you lose weight without trying.  You are constipated or have diarrhea for more than 2-3 days.  You have pain when you urinate or have a bowel movement.  Your abdominal pain wakes you up at night.  Your pain gets worse with meals, after eating, or with certain foods.  You are vomiting and cannot keep anything down.  You have a fever.  You have blood in your urine. Get help right away if:  Your pain does not go away as soon as your health care provider told you to expect.  You cannot stop vomiting.  Your pain is only in areas of the abdomen, such as the right side or the left lower portion of the abdomen. Pain on the right side could be caused by appendicitis.  You have bloody or black stools, or stools that look like tar.  You have severe pain, cramping, or bloating in your abdomen.  You have signs of dehydration, such as: ? Dark urine, very little urine, or no urine. ? Cracked lips. ? Dry mouth. ? Sunken eyes. ? Sleepiness. ? Weakness.  You have trouble breathing or chest  pain. Summary  Often, abdominal pain is not serious and it gets better with no treatment or by being treated at home. However, sometimes abdominal pain is serious.  Watch your condition for any changes.  Take over-the-counter and prescription medicines only as told by your health care provider.  Contact a health care provider if your abdominal pain changes or gets worse.  Get help right away if you have severe pain, cramping, or bloating in your abdomen. This information is not intended to replace advice given to you by your health care provider. Make sure you discuss any questions you have with your health care provider. Document Revised: 06/02/2018 Document Reviewed: 06/02/2018 Elsevier Patient Education  2020 Elsevier Inc.  

## 2019-07-15 ENCOUNTER — Telehealth: Payer: Self-pay

## 2019-07-15 LAB — CBC WITH DIFFERENTIAL/PLATELET
Basophils Absolute: 0 10*3/uL (ref 0.0–0.2)
Basos: 1 %
EOS (ABSOLUTE): 0.2 10*3/uL (ref 0.0–0.4)
Eos: 3 %
Hematocrit: 40.6 % (ref 34.0–46.6)
Hemoglobin: 13.3 g/dL (ref 11.1–15.9)
Immature Grans (Abs): 0 10*3/uL (ref 0.0–0.1)
Immature Granulocytes: 0 %
Lymphocytes Absolute: 1.6 10*3/uL (ref 0.7–3.1)
Lymphs: 25 %
MCH: 34.4 pg — ABNORMAL HIGH (ref 26.6–33.0)
MCHC: 32.8 g/dL (ref 31.5–35.7)
MCV: 105 fL — ABNORMAL HIGH (ref 79–97)
Monocytes Absolute: 0.5 10*3/uL (ref 0.1–0.9)
Monocytes: 8 %
Neutrophils Absolute: 4 10*3/uL (ref 1.4–7.0)
Neutrophils: 63 %
Platelets: 174 10*3/uL (ref 150–450)
RBC: 3.87 x10E6/uL (ref 3.77–5.28)
RDW: 12.1 % (ref 11.7–15.4)
WBC: 6.3 10*3/uL (ref 3.4–10.8)

## 2019-07-15 LAB — COMPREHENSIVE METABOLIC PANEL
ALT: 12 IU/L (ref 0–32)
AST: 21 IU/L (ref 0–40)
Albumin/Globulin Ratio: 1.9 (ref 1.2–2.2)
Albumin: 4.3 g/dL (ref 3.6–4.6)
Alkaline Phosphatase: 85 IU/L (ref 48–121)
BUN/Creatinine Ratio: 20 (ref 12–28)
BUN: 20 mg/dL (ref 8–27)
Bilirubin Total: 0.4 mg/dL (ref 0.0–1.2)
CO2: 22 mmol/L (ref 20–29)
Calcium: 9.4 mg/dL (ref 8.7–10.3)
Chloride: 108 mmol/L — ABNORMAL HIGH (ref 96–106)
Creatinine, Ser: 1 mg/dL (ref 0.57–1.00)
GFR calc Af Amer: 61 mL/min/{1.73_m2} (ref >59)
GFR calc non Af Amer: 53 mL/min/{1.73_m2} — ABNORMAL LOW (ref >59)
Globulin, Total: 2.3 g/dL (ref 1.5–4.5)
Glucose: 86 mg/dL (ref 65–99)
Potassium: 4.6 mmol/L (ref 3.5–5.2)
Sodium: 142 mmol/L (ref 134–144)
Total Protein: 6.6 g/dL (ref 6.0–8.5)

## 2019-07-15 NOTE — Telephone Encounter (Signed)
Pt advised.   Thanks,   -Jhovanny Guinta  

## 2019-07-15 NOTE — Telephone Encounter (Signed)
-----   Message from Virginia Crews, MD sent at 07/15/2019  9:33 AM EDT ----- Normal labs

## 2019-07-23 ENCOUNTER — Ambulatory Visit
Admission: RE | Admit: 2019-07-23 | Discharge: 2019-07-23 | Disposition: A | Discharge status: Home / Self Care | Payer: Medicare Other | Source: Ambulatory Visit | Attending: Family Medicine | Admitting: Family Medicine

## 2019-07-23 ENCOUNTER — Other Ambulatory Visit: Payer: Self-pay

## 2019-07-23 DIAGNOSIS — R1031 Right lower quadrant pain: Secondary | ICD-10-CM | POA: Insufficient documentation

## 2019-07-23 MED ORDER — IOHEXOL 300 MG/ML  SOLN
100.0000 mL | Freq: Once | INTRAMUSCULAR | Status: AC | PRN
  Administered 2019-07-23: 100 mL via INTRAVENOUS

## 2019-07-24 ENCOUNTER — Telehealth: Payer: Self-pay

## 2019-07-24 DIAGNOSIS — R1031 Right lower quadrant pain: Secondary | ICD-10-CM

## 2019-07-24 MED ORDER — MELOXICAM 7.5 MG PO TABS: 7.5000 mg | ORAL_TABLET | Freq: Every day | ORAL | 0 refills | Status: DC

## 2019-07-24 NOTE — Telephone Encounter (Signed)
Per patient she has never taken any Ibuprofen, Aleve or Meloxicam and no GI bleed. She is also asking if you are going to refer her to some specialist?  Pharmacy: [facility] on [address]

## 2019-07-24 NOTE — Telephone Encounter (Signed)
I will send in meloxicam to take once daily to see if that helps lessen the pain some just to get through the weekend. Needs to follow up with Dr. B as I do not know what is going on.

## 2019-07-24 NOTE — Addendum Note (Signed)
Addended by: Mar Daring on: 07/24/2019 06:46 PM   Modules accepted: Orders

## 2019-07-24 NOTE — Telephone Encounter (Signed)
Patient advised as below. Patient reports pain is persistent, and unchanged. Patient reports she is taking Tylenol to help with pain, reports mild pain control. Patient wants to know if there is anything else that provider can do to relief pain. Like medication, lab, or other imaging. Please advise.

## 2019-07-24 NOTE — Telephone Encounter (Signed)
-----   Message from Mar Daring, [place] sent at 07/24/2019 10:35 AM EDT ----- CT abdomen does not show any source for the RLQ pain. You do have a large hiatal hernia. This can cause reflux symptoms and SOB with eating. Sometimes even feelings of food being stuck in the chest and causing vomiting. If RLQ pain is still occurring may need to f/u with Dr. B for re-evaluation.

## 2019-07-24 NOTE — Telephone Encounter (Signed)
Is there any history of GI bleed or issues with NSAIDs like ibuprofen, aleve, meloxicam, etc?

## 2019-07-27 NOTE — Telephone Encounter (Signed)
We can refer for hiatal hernia, but if her pain is still in the RLQ that is not from hiatal hernia.  If still having RLQ pain, recommend GI referral.  If having GERD symptoms and epigastric pain, that could be from hiatal hernia and she could see Gen Surg as requested.

## 2019-07-27 NOTE — Telephone Encounter (Signed)
Patient reports she is still having pain, reports no change. Patient would like to be referred to [facility] to see Dr. Stark Klein for hernia. Please advise.

## 2019-07-28 NOTE — Telephone Encounter (Signed)
lmtcb

## 2019-07-28 NOTE — Telephone Encounter (Signed)
Pt called and is requesting to have a call back regarding her medication as well as this issue. Pt states that she is concerned about side effects and is requesting to speak with a nurse. Please advise.

## 2019-07-28 NOTE — Telephone Encounter (Signed)
Spoke with Ms Weinman, she states she is feeling better are would like to hold off on the referrals for the time being.  She states she will call back if she changes her mind.   Thanks,   -Mickel Baas

## 2019-08-05 ENCOUNTER — Other Ambulatory Visit: Payer: Self-pay | Admitting: Physician Assistant

## 2019-08-05 DIAGNOSIS — K219 Gastro-esophageal reflux disease without esophagitis: Secondary | ICD-10-CM

## 2019-09-01 DIAGNOSIS — H353213 Exudative age-related macular degeneration, right eye, with inactive scar: Secondary | ICD-10-CM | POA: Diagnosis not present

## 2019-09-01 DIAGNOSIS — H43813 Vitreous degeneration, bilateral: Secondary | ICD-10-CM | POA: Diagnosis not present

## 2019-09-01 DIAGNOSIS — H353221 Exudative age-related macular degeneration, left eye, with active choroidal neovascularization: Secondary | ICD-10-CM | POA: Diagnosis not present

## 2019-09-01 DIAGNOSIS — H35432 Paving stone degeneration of retina, left eye: Secondary | ICD-10-CM | POA: Diagnosis not present

## 2019-09-16 DIAGNOSIS — G4733 Obstructive sleep apnea (adult) (pediatric): Secondary | ICD-10-CM | POA: Diagnosis not present

## 2019-09-18 ENCOUNTER — Ambulatory Visit (INDEPENDENT_AMBULATORY_CARE_PROVIDER_SITE_OTHER): Payer: Medicare Other | Admitting: Physician Assistant

## 2019-09-18 ENCOUNTER — Encounter: Payer: Self-pay | Admitting: Physician Assistant

## 2019-09-18 ENCOUNTER — Other Ambulatory Visit: Payer: Self-pay

## 2019-09-18 VITALS — BP 152/77 | HR 62 | Temp 98.2°F | Ht 66.0 in | Wt 169.8 lb

## 2019-09-18 DIAGNOSIS — N3090 Cystitis, unspecified without hematuria: Secondary | ICD-10-CM

## 2019-09-18 DIAGNOSIS — N390 Urinary tract infection, site not specified: Secondary | ICD-10-CM

## 2019-09-18 DIAGNOSIS — M1990 Unspecified osteoarthritis, unspecified site: Secondary | ICD-10-CM

## 2019-09-18 DIAGNOSIS — R300 Dysuria: Secondary | ICD-10-CM

## 2019-09-18 LAB — POCT URINALYSIS DIPSTICK
Bilirubin, UA: NEGATIVE
Glucose, UA: NEGATIVE
Ketones, UA: NEGATIVE
Nitrite, UA: NEGATIVE
Protein, UA: NEGATIVE
Spec Grav, UA: 1.01 (ref 1.010–1.025)
Urobilinogen, UA: 0.2 E.U./dL
pH, UA: 6 (ref 5.0–8.0)

## 2019-09-18 MED ORDER — CEPHALEXIN 500 MG PO CAPS: 500.0000 mg | ORAL_CAPSULE | Freq: Two times a day (BID) | ORAL | 0 refills | Status: AC

## 2019-09-18 NOTE — Progress Notes (Signed)
Established patient visit   Patient: Christina [name]   DOB: 1935-09-01   84 y.o. Female  MRN: [id number] Visit Date: 09/18/2019  Today's healthcare provider: Trinna Post, PA-C   Chief Complaint  Patient presents with  . Urinary Tract Infection   Subjective    HPI  UTI Patient presents today with urinary frequency. She reports that she has had symptoms x 1 week. She does have occasional burning on urination, but reports that this is intermittent. She denies hematuria. Denies lower back pain or abdominal pain. Denies fever.   Arthritis  Patient has osteoarthritis and deformed hands. She is wondering what she can do for arthritis.      Medications: Outpatient Medications Prior to Visit  Medication Sig  . acetaminophen (TYLENOL) 500 MG tablet Take 500 mg by mouth every 8 (eight) hours as needed for mild pain or moderate pain.   Marland Kitchen albuterol (VENTOLIN HFA) 108 (90 Base) MCG/ACT inhaler Inhale 2 puffs into the lungs every 6 (six) hours as needed for wheezing or shortness of breath.  . Ascorbic Acid (VITAMIN C) 1000 MG tablet Take 1,000 mg by mouth daily.  Marland Kitchen aspirin 81 MG tablet Take 81 mg by mouth daily.   . Calcium Carb-Cholecalciferol (CALCIUM 600+D3 PO) Take 1 tablet by mouth daily.  Marland Kitchen docusate sodium (COLACE) 100 MG capsule Take 100 mg by mouth at bedtime.  . Fluticasone-Umeclidin-Vilant (TRELEGY ELLIPTA) 100-62.5-25 MCG/INH AEPB Inhale 1 puff into the lungs daily.  . meloxicam (MOBIC) 7.5 MG tablet Take 1 tablet (7.5 mg total) by mouth daily.  . metoprolol tartrate (LOPRESSOR) 25 MG tablet TAKE 1 TABLET BY MOUTH TWICE A DAY  . Multiple Vitamin (MULTI-VITAMIN DAILY PO) Take by mouth.  . Multiple Vitamins-Minerals (VITAMIN D3 COMPLETE PO) Take by mouth.  . pantoprazole (PROTONIX) 40 MG tablet TAKE 1 TABLET (40 MG TOTAL) BY MOUTH ONCE DAILY TAKE 30 MINUTES BEFORE BREAKFAST.  Marland Kitchen simvastatin (ZOCOR) 40 MG tablet TAKE 1 TABLET BY MOUTH EVERYDAY AT BEDTIME  . tobramycin  (TOBREX) 0.3 % ophthalmic solution INSTILL 1 DROP IN LEFT EYE 4 TIMES/DAY BEGIN 1 DAY BEFORE TREATMENT DAY OF TREATMENT AND 1 DAY AFTER  . budesonide-formoterol (SYMBICORT) 160-4.5 MCG/ACT inhaler INHALE 1 PUFF BY MOUTH TWICE A DAY (Patient not taking: Reported on 07/14/2019)  . ranitidine (ZANTAC) 300 MG tablet Take by mouth.   No facility-administered medications prior to visit.    Review of Systems  Constitutional: Negative.   Gastrointestinal: Negative for abdominal pain.  Genitourinary: Positive for dysuria and frequency. Negative for decreased urine volume, difficulty urinating, flank pain, hematuria, pelvic pain, urgency, vaginal bleeding, vaginal discharge and vaginal pain.  Musculoskeletal: Negative for back pain.  All other systems reviewed and are negative.     Objective    BP (!) 152/77   Pulse 62   Temp 98.2 F (36.8 C)   Ht 5\' 6"  (1.676 m)   Wt 169 lb 12.8 oz (77 kg)   BMI 27.41 kg/m    Physical Exam Constitutional:      General: She is not in acute distress.    Appearance: She is well-developed. She is not diaphoretic.  Cardiovascular:     Rate and Rhythm: Normal rate and regular rhythm.  Pulmonary:     Effort: Pulmonary effort is normal.     Breath sounds: Normal breath sounds.  Abdominal:     General: Bowel sounds are normal. There is no distension.     Palpations: Abdomen is soft.  Tenderness: There is abdominal tenderness in the suprapubic area. There is no guarding or rebound.  Musculoskeletal:     Right hand: Deformity present.     Left hand: Deformity present.     Comments: Bilateral hands with gross deformity, DIP nodes, and angulation of fingers.   Skin:    General: Skin is warm and dry.  Neurological:     Mental Status: She is alert and oriented to person, place, and time.  Psychiatric:        Behavior: Behavior normal.       Results for orders placed or performed in visit on 09/18/19  Urine Culture   Specimen: Urine   UR  Result  Value Ref Range   Urine Culture, Routine Final report (A)    Organism ID, Bacteria Klebsiella pneumoniae (A)    Antimicrobial Susceptibility Comment   Microscopic Examination  Result Value Ref Range   WBC, UA >30 (A) 0 - 5 /hpf   RBC None seen 0 - 2 /hpf   Epithelial Cells (non renal) None seen 0 - 10 /hpf   Casts None seen None seen /lpf   Bacteria, UA None seen None seen/Few  Urinalysis, Routine w reflex microscopic  Result Value Ref Range   Specific Gravity, UA 1.010 1.005 - 1.030   pH, UA 6.5 5.0 - 7.5   Color, UA Yellow Yellow   Appearance Ur Clear Clear   Leukocytes,UA 2+ (A) Negative   Protein,UA Negative Negative/Trace   Glucose, UA Negative Negative   Ketones, UA Negative Negative   RBC, UA 1+ (A) Negative   Bilirubin, UA Negative Negative   Urobilinogen, Ur 0.2 0.2 - 1.0 mg/dL   Nitrite, UA Negative Negative   Microscopic Examination See below:   POCT urinalysis dipstick  Result Value Ref Range   Color, UA yellow    Clarity, UA clear    Glucose, UA Negative Negative   Bilirubin, UA negative    Ketones, UA negative    Spec Grav, UA 1.010 1.010 - 1.025   Blood, UA hemolyzed small    pH, UA 6.0 5.0 - 8.0   Protein, UA Negative Negative   Urobilinogen, UA 0.2 0.2 or 1.0 E.U./dL   Nitrite, UA negative    Leukocytes, UA Moderate (2+) (A) Negative    Assessment & Plan    1. Complicated UTI (urinary tract infection)  - Urine Culture - POCT urinalysis dipstick - Urinalysis, Routine w reflex microscopic  2. Cystitis  - cephALEXin (KEFLEX) 500 MG capsule; Take 1 capsule (500 mg total) by mouth 2 (two) times daily for 5 days.  Dispense: 10 capsule; Refill: 0  3. Arthritis  Discussed various approaches to arthritis. Should avoid NSAIDs due to chronic kidney disease. Tylenol safest. May try voltaren gel as it has less systemic absorption however patient is hesitant to use this on her hands as she doesn't want it to touch her eyes.     Return if symptoms worsen  or fail to improve.      ITrinna Post, PA-C, have reviewed all documentation for this visit. The documentation on 09/23/19 for the exam, diagnosis, procedures, and orders are all accurate and complete.  The entirety of the information documented in the History of Present Illness, Review of Systems and Physical Exam were personally obtained by me. Portions of this information were initially documented by Wilburt Finlay, CMA and reviewed by me for thoroughness and accuracy.        Trinna Post, PA-C  [place] [phone] (phone) [phone] (fax)  [place]

## 2019-09-18 NOTE — Patient Instructions (Signed)

## 2019-09-19 LAB — URINALYSIS, ROUTINE W REFLEX MICROSCOPIC
Bilirubin, UA: NEGATIVE
Glucose, UA: NEGATIVE
Ketones, UA: NEGATIVE
Nitrite, UA: NEGATIVE
Protein,UA: NEGATIVE
Specific Gravity, UA: 1.01 (ref 1.005–1.030)
Urobilinogen, Ur: 0.2 mg/dL (ref 0.2–1.0)
pH, UA: 6.5 (ref 5.0–7.5)

## 2019-09-19 LAB — MICROSCOPIC EXAMINATION
Bacteria, UA: NONE SEEN
Casts: NONE SEEN /lpf
Epithelial Cells (non renal): NONE SEEN /hpf (ref 0–10)
RBC, Urine: NONE SEEN /hpf (ref 0–2)
WBC, UA: >30 /hpf — AB (ref 0–5)

## 2019-09-21 LAB — URINE CULTURE

## 2019-10-28 ENCOUNTER — Other Ambulatory Visit: Payer: Self-pay | Admitting: Physician Assistant

## 2019-10-28 DIAGNOSIS — E7800 Pure hypercholesterolemia, unspecified: Secondary | ICD-10-CM

## 2019-10-28 NOTE — Telephone Encounter (Signed)
Requested Prescriptions  Pending Prescriptions Disp Refills  . simvastatin (ZOCOR) 40 MG tablet [Pharmacy Med Name: SIMVASTATIN 40 MG TABLET] 90 tablet 1    Sig: TAKE 1 TABLET BY MOUTH EVERYDAY AT BEDTIME     Cardiovascular:  Antilipid - Statins Failed - 10/28/2019  1:17 AM      Failed - LDL in normal range and within 360 days    LDL Chol Calc (NIH)  Date Value Ref Range Status  02/20/2019 73 0 - 99 mg/dL Final         Failed - Triglycerides in normal range and within 360 days    Triglycerides  Date Value Ref Range Status  02/20/2019 227 (H) 0 - 149 mg/dL Final         Passed - Total Cholesterol in normal range and within 360 days    Cholesterol, Total  Date Value Ref Range Status  02/20/2019 164 100 - 199 mg/dL Final         Passed - HDL in normal range and within 360 days    HDL  Date Value Ref Range Status  02/20/2019 54 >39 mg/dL Final         Passed - Patient is not pregnant      Passed - Valid encounter within last 12 months    Recent Outpatient Visits          1 month ago Complicated UTI (urinary tract infection)   [place], Adriana M, PA-C   3 months ago Right lower quadrant abdominal pain   [facility] [place], Dionne Bucy, MD   8 months ago Medicare annual wellness visit, subsequent   [place], Clearnce Sorrel, [place]   1 year ago Annual physical exam   [facility] [place] M, [place]   2 years ago Sinusitis, unspecified chronicity, unspecified location   [place], Kirstie Peri, MD

## 2019-10-31 ENCOUNTER — Other Ambulatory Visit: Payer: Self-pay | Admitting: Physician Assistant

## 2019-10-31 DIAGNOSIS — I10 Essential (primary) hypertension: Secondary | ICD-10-CM

## 2019-10-31 NOTE — Telephone Encounter (Signed)
Requested  medications are  due for refill today yes  Requested medications are on the active medication list yes  Last refill 6/30  Last visit Jan 2021  Future visit scheduled no  Notes to clinic Failed protocol of visit within 6 months

## 2019-11-16 ENCOUNTER — Telehealth: Payer: Self-pay

## 2019-11-16 NOTE — Telephone Encounter (Signed)
Confirmed and screened for 11-18-19 ov.

## 2019-11-18 ENCOUNTER — Ambulatory Visit: Payer: Medicare Other | Admitting: Internal Medicine

## 2019-11-18 ENCOUNTER — Other Ambulatory Visit: Payer: Self-pay

## 2019-11-18 DIAGNOSIS — J449 Chronic obstructive pulmonary disease, unspecified: Secondary | ICD-10-CM

## 2019-11-18 DIAGNOSIS — R0602 Shortness of breath: Secondary | ICD-10-CM | POA: Diagnosis not present

## 2019-11-18 LAB — PULMONARY FUNCTION TEST

## 2019-11-20 NOTE — Procedures (Signed)
[place], [id number]  DATE OF SERVICE: November 18, 2019  Complete Pulmonary Function Testing Interpretation:  FINDINGS:  Forced vital capacity is mildly decreased FEV1 is 1.4 L which is 69% predicted and is mildly decreased.  F1 FVC ratio is mildly decreased.  Postbronchodilator there is no significant change in FEV1 clinical improvement may occur in the absence of spirometric improvement.  Total capacity is mildly decreased.  Residual volume is normal.  Residual volume total lung capacity ratio is increased.  FRC is mildly decreased.  DLCO is severely decreased.  IMPRESSION:  This pulmonary function study is consistent with mild obstructive lung disease and mild restrictive lung disease.  DLCO was severely decreased also.  Clinical correlation is recommended  Allyne Gee, MD [facility] Pulmonary Critical Care Medicine Sleep Medicine

## 2019-11-24 DIAGNOSIS — H35432 Paving stone degeneration of retina, left eye: Secondary | ICD-10-CM | POA: Diagnosis not present

## 2019-11-24 DIAGNOSIS — H353221 Exudative age-related macular degeneration, left eye, with active choroidal neovascularization: Secondary | ICD-10-CM | POA: Diagnosis not present

## 2019-11-24 DIAGNOSIS — H43813 Vitreous degeneration, bilateral: Secondary | ICD-10-CM | POA: Diagnosis not present

## 2019-11-24 DIAGNOSIS — H353213 Exudative age-related macular degeneration, right eye, with inactive scar: Secondary | ICD-10-CM | POA: Diagnosis not present

## 2019-12-09 ENCOUNTER — Ambulatory Visit (INDEPENDENT_AMBULATORY_CARE_PROVIDER_SITE_OTHER): Payer: Medicare Other

## 2019-12-09 ENCOUNTER — Other Ambulatory Visit: Payer: Self-pay

## 2019-12-09 DIAGNOSIS — G4733 Obstructive sleep apnea (adult) (pediatric): Secondary | ICD-10-CM

## 2019-12-09 NOTE — Progress Notes (Signed)
95 percentile pressure 12   95th percentile leak 87.4   apnea index 1.5 /hr  apnea-hypopnea index  2.6 /hr   total days used  >4 hr 90 days  total days used <4 hr 0 days  Total compliance 100 percent  She is doing great no problems or questions at this time.

## 2020-01-19 ENCOUNTER — Other Ambulatory Visit: Payer: Self-pay

## 2020-01-19 ENCOUNTER — Encounter: Payer: Self-pay | Admitting: Internal Medicine

## 2020-01-19 ENCOUNTER — Ambulatory Visit: Payer: Medicare Other | Admitting: Internal Medicine

## 2020-01-19 VITALS — BP 148/62 | HR 79 | Temp 98.3°F | Resp 16 | Ht 66.0 in | Wt 169.6 lb

## 2020-01-19 DIAGNOSIS — K219 Gastro-esophageal reflux disease without esophagitis: Secondary | ICD-10-CM | POA: Diagnosis not present

## 2020-01-19 DIAGNOSIS — G4733 Obstructive sleep apnea (adult) (pediatric): Secondary | ICD-10-CM

## 2020-01-19 DIAGNOSIS — N183 Chronic kidney disease, stage 3 unspecified: Secondary | ICD-10-CM | POA: Diagnosis not present

## 2020-01-19 DIAGNOSIS — J449 Chronic obstructive pulmonary disease, unspecified: Secondary | ICD-10-CM | POA: Diagnosis not present

## 2020-01-19 NOTE — Patient Instructions (Signed)

## 2020-01-19 NOTE — Progress Notes (Signed)
[facility] [place], [place] [address]  Pulmonary Sleep Medicine   Office Visit Note  Patient Name: Christina [name] DOB: 08/18/1935 MRN [id number]  Date of Service: 01/19/2020  Complaints/HPI: On trelegy she states that she has not been able to afford the trelegy. Patient states that she would like to try a different medication. She is not sure which one will be covered at the least cost. Patient is going to find out. I did give names of alternatives advair symbicort and dulera. Her last PFT reveals MILD reduction I nher FEV1. OSA has been treated with CPAP appears to be doing well with the CPAP  ROS  General: (-) fever, (-) chills, (-) night sweats, (-) weakness Skin: (-) rashes, (-) itching,. Eyes: (-) visual changes, (-) redness, (-) itching. Nose and Sinuses: (-) nasal stuffiness or itchiness, (-) postnasal drip, (-) nosebleeds, (-) sinus trouble. Mouth and Throat: (-) sore throat, (-) hoarseness. Neck: (-) swollen glands, (-) enlarged thyroid, (-) neck pain. Respiratory: - cough, (-) bloody sputum, + shortness of breath, - wheezing. Cardiovascular: - ankle swelling, (-) chest pain. Lymphatic: (-) lymph node enlargement. Neurologic: (-) numbness, (-) tingling. Psychiatric: (-) anxiety, (-) depression   Current Medication: Outpatient Encounter Medications as of 01/19/2020  Medication Sig  . acetaminophen (TYLENOL) 500 MG tablet Take 500 mg by mouth every 8 (eight) hours as needed for mild pain or moderate pain.   Marland Kitchen albuterol (VENTOLIN HFA) 108 (90 Base) MCG/ACT inhaler Inhale 2 puffs into the lungs every 6 (six) hours as needed for wheezing or shortness of breath.  . Ascorbic Acid (VITAMIN C) 1000 MG tablet Take 1,000 mg by mouth daily.  Marland Kitchen aspirin 81 MG tablet Take 81 mg by mouth daily.   . budesonide-formoterol (SYMBICORT) 160-4.5 MCG/ACT inhaler INHALE 1 PUFF BY MOUTH TWICE A DAY  . Calcium Carb-Cholecalciferol (CALCIUM 600+D3 PO) Take 1  tablet by mouth daily.  Marland Kitchen docusate sodium (COLACE) 100 MG capsule Take 100 mg by mouth at bedtime.  . Fluticasone-Umeclidin-Vilant (TRELEGY ELLIPTA) 100-62.5-25 MCG/INH AEPB Inhale 1 puff into the lungs daily.  . meloxicam (MOBIC) 7.5 MG tablet Take 1 tablet (7.5 mg total) by mouth daily.  . metoprolol tartrate (LOPRESSOR) 25 MG tablet TAKE 1 TABLET BY MOUTH TWICE A DAY  . Multiple Vitamin (MULTI-VITAMIN DAILY PO) Take by mouth.  . Multiple Vitamins-Minerals (VITAMIN D3 COMPLETE PO) Take by mouth.  . pantoprazole (PROTONIX) 40 MG tablet TAKE 1 TABLET (40 MG TOTAL) BY MOUTH ONCE DAILY TAKE 30 MINUTES BEFORE BREAKFAST.  Marland Kitchen simvastatin (ZOCOR) 40 MG tablet TAKE 1 TABLET BY MOUTH EVERYDAY AT BEDTIME  . tobramycin (TOBREX) 0.3 % ophthalmic solution INSTILL 1 DROP IN LEFT EYE 4 TIMES/DAY BEGIN 1 DAY BEFORE TREATMENT DAY OF TREATMENT AND 1 DAY AFTER  . ranitidine (ZANTAC) 300 MG tablet Take by mouth.   No facility-administered encounter medications on file as of 01/19/2020.    Surgical History: Past Surgical History:  Procedure Laterality Date  . ABDOMINAL HYSTERECTOMY  1980   due to dysfunctional uterine bleeding  . APPENDECTOMY  1980  . BREAST SURGERY Left 2000   biopsy  . CARDIAC CATHETERIZATION    . COLONOSCOPY    . CORONARY ARTERY BYPASS GRAFT  2006  . HEMORRHOID SURGERY    . LOWER EXTREMITY ANGIOGRAPHY Right 02/25/2017   Procedure: LOWER EXTREMITY ANGIOGRAPHY;  Surgeon: Algernon Huxley, MD;  Location: [place] [facility];  Service: Cardiovascular;  Laterality: Right;  . LOWER EXTREMITY INTERVENTION  02/25/2017   Procedure: LOWER EXTREMITY INTERVENTION;  Surgeon: Algernon Huxley, MD;  Location: [place] [facility];  Service: Cardiovascular;;  . LUNG BIOPSY  1999   Negative  . TONSILLECTOMY      Medical History: Past Medical History:  Diagnosis Date  . Arthritis   . COPD (chronic obstructive pulmonary disease) ([place])   . Coronary artery disease   . GERD (gastroesophageal reflux  disease)   . Hyperlipidemia   . Hypertension   . Macular degeneration   . PAD (peripheral artery disease) ([place])   . Sleep apnea     Family History: Family History  Problem Relation Age of Onset  . Hypertension Mother   . Hyperlipidemia Mother   . Alzheimer's disease Mother   . CAD Mother   . Heart attack Father   . Lung disease Sister   . Heart disease Sister   . Diabetes Paternal Grandfather        Type 2  . Hearing loss Son     Social History: Social History   Socioeconomic History  . Marital status: Married    Spouse name: Cliffton Mathis Bud  . Number of children: 2  . Years of education: 44  . Highest education level: 12th grade  Occupational History  . Occupation: retired  Tobacco Use  . Smoking status: Former Smoker    Packs/day: 0.50    Years: 5.00    Pack years: 2.50    Quit date: 04/24/2004    Years since quitting: 15.7  . Smokeless tobacco: Never Used  Vaping Use  . Vaping Use: Never used  Substance and Sexual Activity  . Alcohol use: No  . Drug use: No  . Sexual activity: Yes  Other Topics Concern  . Not on file  Social History Narrative  . Not on file   Social Determinants of Health   Financial Resource Strain: Not on file  Food Insecurity: Not on file  Transportation Needs: Not on file  Physical Activity: Not on file  Stress: Not on file  Social Connections: Not on file  Intimate Partner Violence: Not on file    Vital Signs: Blood pressure (!) 148/62, pulse 79, temperature 98.3 F (36.8 C), resp. rate 16, height 5\' 6"  (1.676 m), weight 169 lb 9.6 oz (76.9 kg), SpO2 94 %.  Examination: General Appearance: The patient is well-developed, well-nourished, and in no distress. Skin: Gross inspection of skin unremarkable. Head: normocephalic, no gross deformities. Eyes: no gross deformities noted. ENT: ears appear grossly normal no exudates. Neck: Supple. No thyromegaly. No LAD. Respiratory: no rhonchi noted. Cardiovascular: Normal S1  and S2 without murmur or rub. Extremities: No cyanosis. pulses are equal. Neurologic: Alert and oriented. No involuntary movements.  LABS: Recent Results (from the past 2160 hour(s))  Pulmonary function test     Status: None   Collection Time: 11/18/19  1:30 PM  Result Value Ref Range   FEV1     FVC     FEV1/FVC     TLC     DLCO      Radiology: CT Abdomen Pelvis W Contrast  Result Date: 07/24/2019 CLINICAL DATA:  RIGHT lower quadrant pain. Status post appendectomy and hysterectomy. RIGHT lower quadrant pain for 2 weeks EXAM: CT ABDOMEN AND PELVIS WITH CONTRAST TECHNIQUE: Multidetector CT imaging of the abdomen and pelvis was performed using the standard protocol following bolus administration of intravenous contrast. CONTRAST:  137mL OMNIPAQUE IOHEXOL 300 MG/ML  SOLN COMPARISON:  CT abdomen 07/09/2013 FINDINGS: Lower chest:  Nodular pleuroparenchymal thickening in the lateral LEFT lower lobe is unchanged at 10 mm (image 9/3). Large solid type hilar hernia extends into the posterior RIGHT hemithorax. Also unchanged Hepatobiliary: No focal hepatic lesion. No biliary duct dilatation. Gallbladder is normal. Common bile duct is normal. Pancreas: Pancreas is normal. No ductal dilatation. No pancreatic inflammation. Spleen: Normal spleen Adrenals/urinary tract: Adrenal glands are normal. Intermediate density cyst of the LEFT kidney measures 2.8 cm and minimally increased in size from from 2.4 cm on CT 2015. At time lesion has simple fluid attenuation. RIGHT kidney normal. Ureters and bladder normal Stomach/Bowel: Large sliding-type hilar. Approximately 2/3 of the stomach above the diaphragm. Small bowel normal. Post appendectomy. Cecum normal. Multiple diverticula of the sigmoid colon without acute inflammation. Rectum normal. Vascular/Lymphatic: Abdominal aorta is normal caliber with atherosclerotic calcification. There is no retroperitoneal or periportal lymphadenopathy. No pelvic lymphadenopathy.  Reproductive: Post hysterectomy.  Adnexa unremarkable Other: No free fluid. Musculoskeletal: No aggressive osseous lesion. IMPRESSION: 1. No acute findings in the abdomen pelvis. 2. Sigmoid diverticulosis without evidence diverticulitis. 3. Large sliding-type hiatal hernia. 4. Intermediate density LEFT renal cystic lesion is increased minimally in size over 6 year interval and favored benign lesion. 5. Aortic Atherosclerosis (ICD10-I70.0). Electronically Signed   By: Suzy Bouchard M.D.   On: 07/24/2019 08:15    No results found.  No results found.    Assessment and Plan: Patient Active Problem List   Diagnosis Date Noted  . Aortic atherosclerosis ([place]) 10/26/2016  . Claudication ([place]) 10/26/2016  . COPD (chronic obstructive pulmonary disease) ([place]) 10/22/2016  . OSA on CPAP 09/16/2015  . Abnormal chest x-ray 01/25/2015  . Atrophic vaginitis 01/25/2015  . Atherosclerosis of coronary artery 01/25/2015  . Chronic kidney disease (CKD), stage III (moderate) ([place]) 01/25/2015  . Bloodgood disease 01/25/2015  . Blood glucose elevated 01/25/2015  . Hyperlipidemia 01/25/2015  . Cannot sleep 01/25/2015  . Hypertension 11/02/2014  . Acid reflux 11/02/2014  . Arthritis 10/19/2014  . Adenomatous colon polyp 07/27/2013  . Gastric catarrh 07/27/2013  . Bergmann's syndrome 07/27/2013  . Barrett esophagus 07/07/2013  . Osteopenia 12/13/2009  . PAD (peripheral artery disease) ([place]) 09/24/2007  . DD (diverticular disease) 12/19/2005    1. COPD severe disease she'll continue with her current regimen of inhalers albuterol will be continued as needed.  She is also on Symbicort which will be continued. 2. OSA CPAP Counseling: had a lengthy discussion with the patient regarding the importance of PAP therapy in management of the sleep apnea. Patient appears to understand the risk factor reduction and also understands the risks associated with untreated sleep apnea. Patient will try to make a good faith  effort to remain compliant with therapy. Also instructed the patient on proper cleaning of the device including the water must be changed daily if possible and use of distilled water is preferred. Patient understands that the machine should be regularly cleaned with appropriate recommended cleaning solutions that do not damage the PAP machine for example given white vinegar and water rinses. Other methods such as ozone treatment may not be as good as these simple methods to achieve cleaning. 3. GERD she has been on Zantac as well as taken Protonix with good control she'll continue with current regimen 4. CKD III monitor labs closely.  Monitor fluid hydration status closely.  General Counseling: I have discussed the findings of the evaluation and examination with Mardene Celeste.  I have also discussed any further diagnostic evaluation thatmay be needed or ordered today. Mardene Celeste verbalizes  understanding of the findings of todays visit. We also reviewed her medications today and discussed drug interactions and side effects including but not limited excessive drowsiness and altered mental states. We also discussed that there is always a risk not just to her but also people around her. she has been encouraged to call the office with any questions or concerns that should arise related to todays visit.  No orders of the defined types were placed in this encounter.    Time spent: 23  I have personally obtained a history, examined the patient, evaluated laboratory and imaging results, formulated the assessment and plan and placed orders.    Allyne Gee, MD [facility] Pulmonary and Critical Care Sleep medicine

## 2020-01-21 ENCOUNTER — Other Ambulatory Visit: Payer: Self-pay

## 2020-01-21 ENCOUNTER — Telehealth: Payer: Self-pay

## 2020-01-21 MED ORDER — BUDESONIDE-FORMOTEROL FUMARATE 160-4.5 MCG/ACT IN AERO: INHALATION_SPRAY | RESPIRATORY_TRACT | 3 refills | Status: DC

## 2020-01-21 NOTE — Telephone Encounter (Signed)
As per dr Devona Konig we change to symbicort and d/c trelegy and advised pt to use sample given at visit then she can start symbicort and advised not to use both together

## 2020-01-27 DIAGNOSIS — Z1231 Encounter for screening mammogram for malignant neoplasm of breast: Secondary | ICD-10-CM | POA: Diagnosis not present

## 2020-02-01 ENCOUNTER — Telehealth: Payer: Self-pay

## 2020-02-01 NOTE — Telephone Encounter (Signed)
Pt called asking about her trellegy and Symbicort.  Per pt she cant afford the trellegy so Dr S. Khan said that pt could start back on Symbicort.  I advised pt that if she wanted we could do an application for pt assistance and see if they approver her and she can change back if approved as this helps her  Good.  Pt will call pharmacy and see why she can't get the Symbicort til jan 10 th I think.  I am mailing the application to pt.  Pt will call back to check for samples if she needs to. 

## 2020-02-01 NOTE — Telephone Encounter (Signed)
Pt called asking about her trellegy and Symbicort.  Per pt she cant afford the trellegy so Dr Milta Deiters said that pt could start back on Symbicort.  I advised pt that if she wanted we could do an application for pt assistance and see if they approver her and she can change back if approved as this helps her  Good.  Pt will call pharmacy and see why she can't get the Symbicort til jan 10 th I think.  I am mailing the application to pt.  Pt will call back to check for samples if she needs to.

## 2020-02-02 DIAGNOSIS — H353221 Exudative age-related macular degeneration, left eye, with active choroidal neovascularization: Secondary | ICD-10-CM | POA: Diagnosis not present

## 2020-02-02 DIAGNOSIS — H353213 Exudative age-related macular degeneration, right eye, with inactive scar: Secondary | ICD-10-CM | POA: Diagnosis not present

## 2020-02-02 DIAGNOSIS — H43813 Vitreous degeneration, bilateral: Secondary | ICD-10-CM | POA: Diagnosis not present

## 2020-02-02 DIAGNOSIS — H35432 Paving stone degeneration of retina, left eye: Secondary | ICD-10-CM | POA: Diagnosis not present

## 2020-02-22 NOTE — Progress Notes (Signed)
Subjective:   Christina [name] is a 85 y.o. female who presents for Medicare Annual (Subsequent) preventive examination.  I connected with [name] today by telephone and verified that I am speaking with the correct person using two identifiers. Location patient: home Location provider: work Persons participating in the virtual visit: patient, provider.   I discussed the limitations, risks, security and privacy concerns of performing an evaluation and management service by telephone and the availability of in person appointments. I also discussed with the patient that there may be a patient responsible charge related to this service. The patient expressed understanding and verbally consented to this telephonic visit.    Interactive audio and video telecommunications were attempted between this provider and patient, however failed, due to patient having technical difficulties OR patient did not have access to video capability.  We continued and completed visit with audio only.   Review of Systems    N/A  Cardiac Risk Factors include: advanced age (>40men, >2 women);dyslipidemia;hypertension     Objective:    Today's Vitals   02/23/20 1543  PainSc: 2    There is no height or weight on file to calculate BMI.  Advanced Directives 02/23/2020 02/25/2017 02/22/2017 01/24/2017 10/26/2016 01/27/2016 02/11/2015  Does Patient Have a Medical Advance Directive? Yes No Yes Yes Yes Yes Yes  Type of Paramedic of [place];Living will - [place];Living will [place];Living will [place];Living will Living will;Healthcare Power of [place];Living will  Does patient want to make changes to medical advance directive? - No - Patient declined No - Patient declined - - - -  Copy of [place] in Chart? Yes - validated most recent copy scanned in chart (See row  information) - No - copy requested No - copy requested - - -  Would patient like information on creating a medical advance directive? - No - Patient declined - - - - -    Current Medications (verified) Outpatient Encounter Medications as of 02/23/2020  Medication Sig  . acetaminophen (TYLENOL) 500 MG tablet Take 500 mg by mouth every 8 (eight) hours as needed for mild pain or moderate pain.   Marland Kitchen albuterol (VENTOLIN HFA) 108 (90 Base) MCG/ACT inhaler Inhale 2 puffs into the lungs every 6 (six) hours as needed for wheezing or shortness of breath.  . Ascorbic Acid (VITAMIN C) 1000 MG tablet Take 1,000 mg by mouth daily.  Marland Kitchen aspirin 81 MG tablet Take 81 mg by mouth daily.   . budesonide-formoterol (SYMBICORT) 160-4.5 MCG/ACT inhaler INHALE 1 PUFF BY MOUTH TWICE A DAY  . Calcium Carb-Cholecalciferol (CALCIUM 600+D3 PO) Take 1 tablet by mouth daily.  Marland Kitchen docusate sodium (COLACE) 100 MG capsule Take 100 mg by mouth at bedtime.  . meloxicam (MOBIC) 7.5 MG tablet Take 1 tablet (7.5 mg total) by mouth daily.  . metoprolol tartrate (LOPRESSOR) 25 MG tablet TAKE 1 TABLET BY MOUTH TWICE A DAY  . Multiple Vitamin (MULTI-VITAMIN DAILY PO) Take by mouth.  . Multiple Vitamins-Minerals (VITAMIN D3 COMPLETE PO) Take by mouth.  . pantoprazole (PROTONIX) 40 MG tablet TAKE 1 TABLET (40 MG TOTAL) BY MOUTH ONCE DAILY TAKE 30 MINUTES BEFORE BREAKFAST.  Marland Kitchen simvastatin (ZOCOR) 40 MG tablet TAKE 1 TABLET BY MOUTH EVERYDAY AT BEDTIME  . tobramycin (TOBREX) 0.3 % ophthalmic solution INSTILL 1 DROP IN LEFT EYE 4 TIMES/DAY BEGIN 1 DAY BEFORE TREATMENT DAY OF TREATMENT AND 1 DAY AFTER  .  ranitidine (ZANTAC) 300 MG tablet Take by mouth.   No facility-administered encounter medications on file as of 02/23/2020.    Allergies (verified) Baclofen, Codeine, Plavix [clopidogrel], Sulfa antibiotics, and Latex   History: Past Medical History:  Diagnosis Date  . Arthritis   . COPD (chronic obstructive pulmonary disease) ([place])   .  Coronary artery disease   . GERD (gastroesophageal reflux disease)   . Hyperlipidemia   . Hypertension   . Macular degeneration   . PAD (peripheral artery disease) ([place])   . Sleep apnea    Past Surgical History:  Procedure Laterality Date  . ABDOMINAL HYSTERECTOMY  1980   due to dysfunctional uterine bleeding  . APPENDECTOMY  1980  . BREAST SURGERY Left 2000   biopsy  . CARDIAC CATHETERIZATION    . COLONOSCOPY    . CORONARY ARTERY BYPASS GRAFT  2006  . HEMORRHOID SURGERY    . LOWER EXTREMITY ANGIOGRAPHY Right 02/25/2017   Procedure: LOWER EXTREMITY ANGIOGRAPHY;  Surgeon: Algernon Huxley, MD;  Location: [facility];  Service: Cardiovascular;  Laterality: Right;  . LOWER EXTREMITY INTERVENTION  02/25/2017   Procedure: LOWER EXTREMITY INTERVENTION;  Surgeon: Algernon Huxley, MD;  Location: [facility];  Service: Cardiovascular;;  . LUNG BIOPSY  1999   Negative  . TONSILLECTOMY     Family History  Problem Relation Age of Onset  . Hypertension Mother   . Hyperlipidemia Mother   . Alzheimer's disease Mother   . CAD Mother   . Heart attack Father   . Lung disease Sister   . Heart disease Sister   . Diabetes Paternal Grandfather        Type 2  . Hearing loss Son    Social History   Socioeconomic History  . Marital status: Married    Spouse name: Cliffton Mathis Bud  . Number of children: 2  . Years of education: 75  . Highest education level: 12th grade  Occupational History  . Occupation: retired  Tobacco Use  . Smoking status: Former Smoker    Packs/day: 0.50    Years: 5.00    Pack years: 2.50    Quit date: 04/24/2004    Years since quitting: 15.8  . Smokeless tobacco: Never Used  Vaping Use  . Vaping Use: Never used  Substance and Sexual Activity  . Alcohol use: No  . Drug use: No  . Sexual activity: Yes  Other Topics Concern  . Not on file  Social History Narrative  . Not on file   Social Determinants of Health   Financial Resource Strain:  Low Risk   . Difficulty of Paying Living Expenses: Not hard at all  Food Insecurity: No Food Insecurity  . Worried About Charity fundraiser in the Last Year: Never true  . Ran Out of Food in the Last Year: Never true  Transportation Needs: No Transportation Needs  . Lack of Transportation (Medical): No  . Lack of Transportation (Non-Medical): No  Physical Activity: Inactive  . Days of Exercise per Week: 0 days  . Minutes of Exercise per Session: 0 min  Stress: No Stress Concern Present  . Feeling of Stress : Not at all  Social Connections: Moderately Integrated  . Frequency of Communication with Friends and Family: Three times a week  . Frequency of Social Gatherings with Friends and Family: More than three times a week  . Attends Religious Services: More than 4 times per year  . Active Member of  Clubs or Organizations: No  . Attends Archivist Meetings: Never  . Marital Status: Married    Tobacco Counseling Counseling given: Not Answered   Clinical Intake:  Pre-visit preparation completed: Yes  Pain : 0-10 Pain Score: 2  Pain Type: Chronic pain (arthritis) Pain Location: Hand (and fingers) Pain Orientation: Right,Left Pain Descriptors / Indicators: Aching Pain Frequency: Constant Pain Relieving Factors: Take Tylenol as needed for pain.  Pain Relieving Factors: Take Tylenol as needed for pain.  Nutritional Risks: None Diabetes: No  How often do you need to have someone help you when you read instructions, pamphlets, or other written materials from your doctor or pharmacy?: 3 - Sometimes  Diabetic? No  Interpreter Needed?: No  Information entered by :: [facility], LPN   Activities of Daily Living In your present state of health, do you have any difficulty performing the following activities: 02/23/2020  Hearing? N  Vision? Y  Comment Due to MD in left eye.  Difficulty concentrating or making decisions? N  Walking or climbing stairs? Y  Comment Due  to SOB.  Dressing or bathing? N  Doing errands, shopping? N  Preparing Food and eating ? N  Using the Toilet? N  In the past six months, have you accidently leaked urine? N  Do you have problems with loss of bowel control? N  Managing your Medications? N  Managing your Finances? N  Housekeeping or managing your Housekeeping? N  Some recent data might be hidden    Patient Care Team: Rubye Beach as PCP - General (Family Medicine) [name] as Consulting Physician (Pulmonary Disease) [name], MD as Consulting Physician (Ophthalmology)  Indicate any recent Medical Services you may have received from other than [facility] providers in the past year (date may be approximate).     Assessment:   This is a routine wellness examination for Scotti.  Hearing/Vision screen No exam data present  Dietary issues and exercise activities discussed: Current Exercise Habits: The patient does not participate in regular exercise at present, Exercise limited by: orthopedic condition(s)  Goals    . Exercise 150 minutes per week (moderate activity)      Depression Screen PHQ 2/9 Scores 02/23/2020 02/20/2019 06/23/2018 02/17/2018 01/24/2017 01/27/2016 01/25/2015  PHQ - 2 Score 0 0 0 0 2 0 0  PHQ- 9 Score - 5 - 4 4 - -    Fall Risk Fall Risk  02/23/2020 02/20/2019 06/23/2018 02/17/2018 01/24/2017  Falls in the past year? 0 0 0 0 No  Number falls in past yr: 0 0 - - -  Injury with Fall? 0 0 - - -  Follow up - Falls evaluation completed - - -    FALL RISK PREVENTION PERTAINING TO THE HOME:  Any stairs in or around the home? No  If so, are there any without handrails? No  Home free of loose throw rugs in walkways, pet beds, electrical cords, etc? Yes  Adequate lighting in your home to reduce risk of falls? Yes   ASSISTIVE DEVICES UTILIZED TO PREVENT FALLS:  Life alert? No  Use of a cane, walker or w/c? No  Grab bars in the bathroom? No  Shower chair or bench in shower? No   Elevated toilet seat or a handicapped toilet? Yes    Cognitive Function: Normal cognitive status assessed by observation by this Nurse Health Advisor. No abnormalities found.          Immunizations Immunization History  Administered Date(s) Administered  .  Fluad Quad(high Dose 65+) 10/20/2018  . Influenza Split 12/01/2005, 11/17/2007  . Influenza, High Dose Seasonal PF 10/25/2016, 10/21/2019  . Influenza-Unspecified 10/06/2013, 10/07/2015, 10/25/2016, 10/28/2017  . PFIZER(Purple Top)SARS-COV-2 Vaccination 02/27/2019, 03/20/2019, 11/09/2019  . Pneumococcal Conjugate-13 12/28/2013  . Pneumococcal Polysaccharide-23 05/31/2003  . Zoster 03/17/2008    TDAP status: Due, Education has been provided regarding the importance of this vaccine. Advised may receive this vaccine at local pharmacy or Health Dept. Aware to provide a copy of the vaccination record if obtained from local pharmacy or Health Dept. Verbalized acceptance and understanding.  Flu Vaccine status: Up to date  Pneumococcal vaccine status: Up to date  Covid-19 vaccine status: Completed vaccines  Qualifies for Shingles Vaccine? Yes   Zostavax completed Yes   Shingrix Completed?: No.    Education has been provided regarding the importance of this vaccine. Patient has been advised to call insurance company to determine out of pocket expense if they have not yet received this vaccine. Advised may also receive vaccine at local pharmacy or Health Dept. Verbalized acceptance and understanding.  Screening Tests Health Maintenance  Topic Date Due  . DEXA SCAN  01/12/2018  . TETANUS/TDAP  02/19/2029 (Originally 10/15/1954)  . INFLUENZA VACCINE  Completed  . COVID-19 Vaccine  Completed  . PNA vac Low Risk Adult  Completed    Health Maintenance  Health Maintenance Due  Topic Date Due  . DEXA SCAN  01/12/2018    Colorectal cancer screening: No longer required.   Mammogram status: No longer required due to age.  Bone  Density status: Currently due. Declined an order at this time.  Lung Cancer Screening: (Low Dose CT Chest recommended if Age 69-80 years, 30 pack-year currently smoking OR have quit w/in 15years.) does not qualify.   Additional Screening:  Vision Screening: Recommended annual ophthalmology exams for early detection of glaucoma and other disorders of the eye. Is the patient up to date with their annual eye exam?  Yes  Who is the provider or what is the name of the office in which the patient attends annual eye exams? Dr Baird [facility] If pt is not established with a provider, would they like to be referred to a provider to establish care? No .   Dental Screening: Recommended annual dental exams for proper oral hygiene  Community Resource Referral / Chronic Care Management: CRR required this visit?  No   CCM required this visit?  No      Plan:     I have personally reviewed and noted the following in the patient's chart:   . Medical and social history . Use of alcohol, tobacco or illicit drugs  . Current medications and supplements . Functional ability and status . Nutritional status . Physical activity . Advanced directives . List of other physicians . Hospitalizations, surgeries, and ER visits in previous 12 months . Vitals . Screenings to include cognitive, depression, and falls . Referrals and appointments  In addition, I have reviewed and discussed with patient certain preventive protocols, quality metrics, and best practice recommendations. A written personalized care plan for preventive services as well as general preventive health recommendations were provided to patient.     Johnda Billiot [place], [place]   [id number]   Nurse Notes: Pt declined a DEXA scan order at this time.

## 2020-02-23 ENCOUNTER — Ambulatory Visit (INDEPENDENT_AMBULATORY_CARE_PROVIDER_SITE_OTHER): Payer: Medicare Other

## 2020-02-23 ENCOUNTER — Other Ambulatory Visit: Payer: Self-pay

## 2020-02-23 DIAGNOSIS — Z0000 Encounter for general adult medical examination without abnormal findings: Secondary | ICD-10-CM | POA: Diagnosis not present

## 2020-02-23 NOTE — Patient Instructions (Signed)
Christina [name] , Thank you for taking time to come for your Medicare Wellness Visit. I appreciate your ongoing commitment to your health goals. Please review the following plan we discussed and let me know if I can assist you in the future.   Screening recommendations/referrals: Colonoscopy: No longer required.  Mammogram: No longer required.  Bone Density: Currently due, declined order today.  Recommended yearly ophthalmology/optometry visit for glaucoma screening and checkup Recommended yearly dental visit for hygiene and checkup  Vaccinations: Influenza vaccine: Done 10/21/19 Pneumococcal vaccine: Completed series Tdap vaccine: Currently due, declined receiving.  Shingles vaccine: Shingrix discussed. Please contact your pharmacy for coverage information.     Advanced directives: Currently on file.  Conditions/risks identified: Recommend to start exercising 3 days a week for at least 30 minutes at a time.   Next appointment: 03/18/20 @ 10:00 AM with Louise 65 Years and Older, Female Preventive care refers to lifestyle choices and visits with your health care provider that can promote health and wellness. What does preventive care include?  A yearly physical exam. This is also called an annual well check.  Dental exams once or twice a year.  Routine eye exams. Ask your health care provider how often you should have your eyes checked.  Personal lifestyle choices, including:  Daily care of your teeth and gums.  Regular physical activity.  Eating a healthy diet.  Avoiding tobacco and drug use.  Limiting alcohol use.  Practicing safe sex.  Taking low-dose aspirin every day.  Taking vitamin and mineral supplements as recommended by your health care provider. What happens during an annual well check? The services and screenings done by your health care provider during your annual well check will depend on your age, overall health, lifestyle risk  factors, and family history of disease. Counseling  Your health care provider may ask you questions about your:  Alcohol use.  Tobacco use.  Drug use.  Emotional well-being.  Home and relationship well-being.  Sexual activity.  Eating habits.  History of falls.  Memory and ability to understand (cognition).  Work and work Statistician.  Reproductive health. Screening  You may have the following tests or measurements:  Height, weight, and BMI.  Blood pressure.  Lipid and cholesterol levels. These may be checked every 5 years, or more frequently if you are over 83 years old.  Skin check.  Lung cancer screening. You may have this screening every year starting at age 83 if you have a 30-pack-year history of smoking and currently smoke or have quit within the past 15 years.  Fecal occult blood test (FOBT) of the stool. You may have this test every year starting at age 83.  Flexible sigmoidoscopy or colonoscopy. You may have a sigmoidoscopy every 5 years or a colonoscopy every 10 years starting at age 83.  Hepatitis C blood test.  Hepatitis B blood test.  Sexually transmitted disease (STD) testing.  Diabetes screening. This is done by checking your blood sugar (glucose) after you have not eaten for a while (fasting). You may have this done every 1-3 years.  Bone density scan. This is done to screen for osteoporosis. You may have this done starting at age 83.  Mammogram. This may be done every 1-2 years. Talk to your health care provider about how often you should have regular mammograms. Talk with your health care provider about your test results, treatment options, and if necessary, the need for more tests. Vaccines  Your health care  provider may recommend certain vaccines, such as:  Influenza vaccine. This is recommended every year.  Tetanus, diphtheria, and acellular pertussis (Tdap, Td) vaccine. You may need a Td booster every 10 years.  Zoster vaccine. You  may need this after age 83.  Pneumococcal 13-valent conjugate (PCV13) vaccine. One dose is recommended after age 83.  Pneumococcal polysaccharide (PPSV23) vaccine. One dose is recommended after age 83. Talk to your health care provider about which screenings and vaccines you need and how often you need them. This information is not intended to replace advice given to you by your health care provider. Make sure you discuss any questions you have with your health care provider. Document Released: 02/18/2015 Document Revised: 10/12/2015 Document Reviewed: 11/23/2014 Elsevier Interactive Patient Education  2017 [employer] Prevention in the Home Falls can cause injuries. They can happen to people of all ages. There are many things you can do to make your home safe and to help prevent falls. What can I do on the outside of my home?  Regularly fix the edges of walkways and driveways and fix any cracks.  Remove anything that might make you trip as you walk through a door, such as a raised step or threshold.  Trim any bushes or trees on the path to your home.  Use bright outdoor lighting.  Clear any walking paths of anything that might make someone trip, such as rocks or tools.  Regularly check to see if handrails are loose or broken. Make sure that both sides of any steps have handrails.  Any raised decks and porches should have guardrails on the edges.  Have any leaves, snow, or ice cleared regularly.  Use sand or salt on walking paths during winter.  Clean up any spills in your garage right away. This includes oil or grease spills. What can I do in the bathroom?  Use night lights.  Install grab bars by the toilet and in the tub and shower. Do not use towel bars as grab bars.  Use non-skid mats or decals in the tub or shower.  If you need to sit down in the shower, use a plastic, non-slip stool.  Keep the floor dry. Clean up any water that spills on the floor as soon as  it happens.  Remove soap buildup in the tub or shower regularly.  Attach bath mats securely with double-sided non-slip rug tape.  Do not have throw rugs and other things on the floor that can make you trip. What can I do in the bedroom?  Use night lights.  Make sure that you have a light by your bed that is easy to reach.  Do not use any sheets or blankets that are too big for your bed. They should not hang down onto the floor.  Have a firm chair that has side arms. You can use this for support while you get dressed.  Do not have throw rugs and other things on the floor that can make you trip. What can I do in the kitchen?  Clean up any spills right away.  Avoid walking on wet floors.  Keep items that you use a lot in easy-to-reach places.  If you need to reach something above you, use a strong step stool that has a grab bar.  Keep electrical cords out of the way.  Do not use floor polish or wax that makes floors slippery. If you must use wax, use non-skid floor wax.  Do not have throw  rugs and other things on the floor that can make you trip. What can I do with my stairs?  Do not leave any items on the stairs.  Make sure that there are handrails on both sides of the stairs and use them. Fix handrails that are broken or loose. Make sure that handrails are as long as the stairways.  Check any carpeting to make sure that it is firmly attached to the stairs. Fix any carpet that is loose or worn.  Avoid having throw rugs at the top or bottom of the stairs. If you do have throw rugs, attach them to the floor with carpet tape.  Make sure that you have a light switch at the top of the stairs and the bottom of the stairs. If you do not have them, ask someone to add them for you. What else can I do to help prevent falls?  Wear shoes that:  Do not have high heels.  Have rubber bottoms.  Are comfortable and fit you well.  Are closed at the toe. Do not wear sandals.  If  you use a stepladder:  Make sure that it is fully opened. Do not climb a closed stepladder.  Make sure that both sides of the stepladder are locked into place.  Ask someone to hold it for you, if possible.  Clearly mark and make sure that you can see:  Any grab bars or handrails.  First and last steps.  Where the edge of each step is.  Use tools that help you move around (mobility aids) if they are needed. These include:  Canes.  Walkers.  Scooters.  Crutches.  Turn on the lights when you go into a dark area. Replace any light bulbs as soon as they burn out.  Set up your furniture so you have a clear path. Avoid moving your furniture around.  If any of your floors are uneven, fix them.  If there are any pets around you, be aware of where they are.  Review your medicines with your doctor. Some medicines can make you feel dizzy. This can increase your chance of falling. Ask your doctor what other things that you can do to help prevent falls. This information is not intended to replace advice given to you by your health care provider. Make sure you discuss any questions you have with your health care provider. Document Released: 11/18/2008 Document Revised: 06/30/2015 Document Reviewed: 02/26/2014 Elsevier Interactive Patient Education  2017 [employer].

## 2020-02-24 ENCOUNTER — Encounter: Payer: Medicare Other | Admitting: Physician Assistant

## 2020-03-12 ENCOUNTER — Other Ambulatory Visit: Payer: Self-pay | Admitting: Physician Assistant

## 2020-03-12 DIAGNOSIS — K219 Gastro-esophageal reflux disease without esophagitis: Secondary | ICD-10-CM

## 2020-03-12 NOTE — Telephone Encounter (Signed)
Requested medications are due for refill today yes  Requested medications are on the active medication list yes  Last refill 11/11  Last visit Do not see this med/dx addressed   Notes to clinic Please assess.

## 2020-03-18 ENCOUNTER — Encounter: Payer: Self-pay | Admitting: Physician Assistant

## 2020-03-18 ENCOUNTER — Ambulatory Visit
Admission: RE | Admit: 2020-03-18 | Discharge: 2020-03-18 | Disposition: A | Discharge status: Home / Self Care | Payer: Medicare Other | Source: Ambulatory Visit | Attending: Physician Assistant | Admitting: Physician Assistant

## 2020-03-18 ENCOUNTER — Other Ambulatory Visit: Payer: Self-pay

## 2020-03-18 ENCOUNTER — Ambulatory Visit (INDEPENDENT_AMBULATORY_CARE_PROVIDER_SITE_OTHER): Payer: Medicare Other | Admitting: Physician Assistant

## 2020-03-18 VITALS — BP 166/69 | HR 65 | Temp 98.0°F | Resp 16 | Ht 65.0 in | Wt 170.0 lb

## 2020-03-18 DIAGNOSIS — Z9989 Dependence on other enabling machines and devices: Secondary | ICD-10-CM | POA: Diagnosis not present

## 2020-03-18 DIAGNOSIS — I6523 Occlusion and stenosis of bilateral carotid arteries: Secondary | ICD-10-CM | POA: Diagnosis not present

## 2020-03-18 DIAGNOSIS — N1831 Chronic kidney disease, stage 3a: Secondary | ICD-10-CM | POA: Diagnosis not present

## 2020-03-18 DIAGNOSIS — M19049 Primary osteoarthritis, unspecified hand: Secondary | ICD-10-CM | POA: Diagnosis not present

## 2020-03-18 DIAGNOSIS — R413 Other amnesia: Secondary | ICD-10-CM | POA: Diagnosis not present

## 2020-03-18 DIAGNOSIS — Z0000 Encounter for general adult medical examination without abnormal findings: Secondary | ICD-10-CM | POA: Diagnosis not present

## 2020-03-18 DIAGNOSIS — M542 Cervicalgia: Secondary | ICD-10-CM | POA: Diagnosis not present

## 2020-03-18 DIAGNOSIS — G4733 Obstructive sleep apnea (adult) (pediatric): Secondary | ICD-10-CM | POA: Diagnosis not present

## 2020-03-18 DIAGNOSIS — I700 Atherosclerosis of aorta: Secondary | ICD-10-CM | POA: Diagnosis not present

## 2020-03-18 DIAGNOSIS — E7800 Pure hypercholesterolemia, unspecified: Secondary | ICD-10-CM | POA: Diagnosis not present

## 2020-03-18 DIAGNOSIS — I10 Essential (primary) hypertension: Secondary | ICD-10-CM

## 2020-03-18 DIAGNOSIS — R739 Hyperglycemia, unspecified: Secondary | ICD-10-CM

## 2020-03-18 DIAGNOSIS — J418 Mixed simple and mucopurulent chronic bronchitis: Secondary | ICD-10-CM | POA: Diagnosis not present

## 2020-03-18 MED ORDER — SIMVASTATIN 40 MG PO TABS: ORAL_TABLET | ORAL | 1 refills | Status: DC

## 2020-03-18 MED ORDER — METOPROLOL TARTRATE 25 MG PO TABS: 25.0000 mg | ORAL_TABLET | Freq: Two times a day (BID) | ORAL | 1 refills | Status: DC

## 2020-03-18 NOTE — Addendum Note (Signed)
Addended by: Mar Daring on: 03/18/2020 05:36 PM   Modules accepted: Orders

## 2020-03-18 NOTE — Progress Notes (Signed)
Complete physical exam   Patient: Christina [name]   DOB: [birth year]   85 y.o. Female  MRN: [id number] Visit Date: 03/18/2020  Today's healthcare provider: Mar Daring, PA-C   Chief Complaint  Patient presents with  . Annual Exam   Subjective    Christina [name] is a 85 y.o. female who presents today for a complete physical exam.  She reports consuming a general diet. Home exercise routine includes walking. She generally feels well. She reports sleeping well. She does have additional problems to discuss today.  Patient C/O persistent neck pain, and hand pain. Patient reports moderate pain, taking Tylenol only. HPI  01/27/2020 Mammogram   Past Medical History:  Diagnosis Date  . Arthritis   . COPD (chronic obstructive pulmonary disease) ([place])   . Coronary artery disease   . GERD (gastroesophageal reflux disease)   . Hyperlipidemia   . Hypertension   . Macular degeneration   . PAD (peripheral artery disease) ([place])   . Sleep apnea    Past Surgical History:  Procedure Laterality Date  . ABDOMINAL HYSTERECTOMY  1980   due to dysfunctional uterine bleeding  . APPENDECTOMY  1980  . BREAST SURGERY Left 2000   biopsy  . CARDIAC CATHETERIZATION    . COLONOSCOPY    . CORONARY ARTERY BYPASS GRAFT  2006  . HEMORRHOID SURGERY    . LOWER EXTREMITY ANGIOGRAPHY Right 02/25/2017   Procedure: LOWER EXTREMITY ANGIOGRAPHY;  Surgeon: Algernon Huxley, MD;  Location: [facility];  Service: Cardiovascular;  Laterality: Right;  . LOWER EXTREMITY INTERVENTION  02/25/2017   Procedure: LOWER EXTREMITY INTERVENTION;  Surgeon: Algernon Huxley, MD;  Location: [facility];  Service: Cardiovascular;;  . LUNG BIOPSY  1999   Negative  . TONSILLECTOMY     Social History   Socioeconomic History  . Marital status: Married    Spouse name: Cliffton Mathis Bud  . Number of children: 2  . Years of education: 34  . Highest education level: 12th grade  Occupational  History  . Occupation: retired  Tobacco Use  . Smoking status: Former Smoker    Packs/day: 0.50    Years: 5.00    Pack years: 2.50    Quit date: 04/24/2004    Years since quitting: 15.9  . Smokeless tobacco: Never Used  Vaping Use  . Vaping Use: Never used  Substance and Sexual Activity  . Alcohol use: No  . Drug use: No  . Sexual activity: Yes  Other Topics Concern  . Not on file  Social History Narrative  . Not on file   Social Determinants of Health   Financial Resource Strain: Low Risk   . Difficulty of Paying Living Expenses: Not hard at all  Food Insecurity: No Food Insecurity  . Worried About Charity fundraiser in the Last Year: Never true  . Ran Out of Food in the Last Year: Never true  Transportation Needs: No Transportation Needs  . Lack of Transportation (Medical): No  . Lack of Transportation (Non-Medical): No  Physical Activity: Inactive  . Days of Exercise per Week: 0 days  . Minutes of Exercise per Session: 0 min  Stress: No Stress Concern Present  . Feeling of Stress : Not at all  Social Connections: Moderately Integrated  . Frequency of Communication with Friends and Family: Three times a week  . Frequency of Social Gatherings with Friends and Family: More than three times a week  .  Attends Religious Services: More than 4 times per year  . Active Member of Clubs or Organizations: No  . Attends Archivist Meetings: Never  . Marital Status: Married  Human resources officer Violence: Not At Risk  . Fear of Current or Ex-Partner: No  . Emotionally Abused: No  . Physically Abused: No  . Sexually Abused: No   Family Status  Relation Name Status  . Mother  Deceased  . Father  Deceased at age 19       MI  . Sister  Alive  . PGF  (Not Specified)  . Son  Alive  . Son  Alive   Family History  Problem Relation Age of Onset  . Hypertension Mother   . Hyperlipidemia Mother   . Alzheimer's disease Mother   . CAD Mother   . Heart attack Father   .  Lung disease Sister   . Heart disease Sister   . Diabetes Paternal Grandfather        Type 2  . Hearing loss Son    Allergies  Allergen Reactions  . Baclofen Other (See Comments)  . Codeine Nausea Only  . Plavix [Clopidogrel]     brusing all over  . Sulfa Antibiotics Nausea Only  . Latex Rash    Patient Care Team: Mar Daring, PA-C as PCP - General (Family Medicine) [name] as Consulting Physician (Pulmonary Disease) [name], MD as Consulting Physician (Ophthalmology)   Medications: Outpatient Medications Prior to Visit  Medication Sig  . acetaminophen (TYLENOL) 500 MG tablet Take 500 mg by mouth every 8 (eight) hours as needed for mild pain or moderate pain.   Marland Kitchen albuterol (VENTOLIN HFA) 108 (90 Base) MCG/ACT inhaler Inhale 2 puffs into the lungs every 6 (six) hours as needed for wheezing or shortness of breath.  . Ascorbic Acid (VITAMIN C) 1000 MG tablet Take 1,000 mg by mouth daily.  Marland Kitchen aspirin 81 MG tablet Take 81 mg by mouth daily.   . budesonide-formoterol (SYMBICORT) 160-4.5 MCG/ACT inhaler INHALE 1 PUFF BY MOUTH TWICE A DAY  . Calcium Carb-Cholecalciferol (CALCIUM 600+D3 PO) Take 1 tablet by mouth daily.  . metoprolol tartrate (LOPRESSOR) 25 MG tablet TAKE 1 TABLET BY MOUTH TWICE A DAY  . Multiple Vitamin (MULTI-VITAMIN DAILY PO) Take by mouth.  . Multiple Vitamins-Minerals (VITAMIN D3 COMPLETE PO) Take by mouth.  . pantoprazole (PROTONIX) 40 MG tablet TAKE 1 TABLET (40 MG TOTAL) BY MOUTH ONCE DAILY TAKE 30 MINUTES BEFORE BREAKFAST.  Marland Kitchen simvastatin (ZOCOR) 40 MG tablet TAKE 1 TABLET BY MOUTH EVERYDAY AT BEDTIME  . tobramycin (TOBREX) 0.3 % ophthalmic solution INSTILL 1 DROP IN LEFT EYE 4 TIMES/DAY BEGIN 1 DAY BEFORE TREATMENT DAY OF TREATMENT AND 1 DAY AFTER  . [DISCONTINUED] docusate sodium (COLACE) 100 MG capsule Take 100 mg by mouth at bedtime.  . [DISCONTINUED] meloxicam (MOBIC) 7.5 MG tablet Take 1 tablet (7.5 mg total) by mouth daily.  .  [DISCONTINUED] ranitidine (ZANTAC) 300 MG tablet Take by mouth.   No facility-administered medications prior to visit.    Review of Systems  Constitutional: Positive for fatigue.  HENT: Positive for congestion, drooling, sneezing and trouble swallowing.   Eyes: Positive for discharge and visual disturbance.  Respiratory: Positive for cough and shortness of breath.   Cardiovascular: Positive for chest pain and leg swelling.  Gastrointestinal: Negative.   Endocrine: Negative.   Genitourinary: Negative.   Musculoskeletal: Positive for arthralgias, joint swelling, neck pain and neck stiffness.  Skin: Negative.  Allergic/Immunologic: Positive for environmental allergies.  Neurological: Negative.   Hematological: Bruises/bleeds easily.  Psychiatric/Behavioral: Positive for sleep disturbance.    Last CBC Lab Results  Component Value Date   WBC 6.3 07/14/2019   HGB 13.3 07/14/2019   HCT 40.6 07/14/2019   MCV 105 (H) 07/14/2019   MCH 34.4 (H) 07/14/2019   RDW 12.1 07/14/2019   PLT 174 44/02/270   Last metabolic panel Lab Results  Component Value Date   GLUCOSE 86 07/14/2019   NA 142 07/14/2019   K 4.6 07/14/2019   CL 108 (H) 07/14/2019   CO2 22 07/14/2019   BUN 20 07/14/2019   CREATININE 1.00 07/14/2019   GFRNONAA 53 (L) 07/14/2019   GFRAA 61 07/14/2019   CALCIUM 9.4 07/14/2019   PROT 6.6 07/14/2019   ALBUMIN 4.3 07/14/2019   LABGLOB 2.3 07/14/2019   AGRATIO 1.9 07/14/2019   BILITOT 0.4 07/14/2019   ALKPHOS 85 07/14/2019   AST 21 07/14/2019   ALT 12 07/14/2019   Last lipids Lab Results  Component Value Date   CHOL 164 02/20/2019   HDL 54 02/20/2019   LDLCALC 73 02/20/2019   TRIG 227 (H) 02/20/2019   CHOLHDL 3.0 02/20/2019   Last hemoglobin A1c Lab Results  Component Value Date   HGBA1C 5.8 (H) 02/20/2019   Last thyroid functions Lab Results  Component Value Date   TSH 3.470 02/20/2019      Objective    BP (!) 166/69 (BP Location: Left Arm, Patient  Position: Sitting, Cuff Size: Large)   Pulse 65   Temp 98 F (36.7 C) (Oral)   Resp 16   Ht 5\' 5"  (1.651 m)   Wt 170 lb (77.1 kg)   BMI 28.29 kg/m  BP Readings from Last 3 Encounters:  03/18/20 (!) 166/69  01/19/20 (!) 148/62  09/18/19 (!) 152/77   Wt Readings from Last 3 Encounters:  03/18/20 170 lb (77.1 kg)  01/19/20 169 lb 9.6 oz (76.9 kg)  09/18/19 169 lb 12.8 oz (77 kg)      Physical Exam Vitals reviewed.  Constitutional:      General: She is not in acute distress.    Appearance: Normal appearance. She is well-developed, well-groomed, overweight and well-nourished. She is not ill-appearing or diaphoretic.  HENT:     Head: Normocephalic and atraumatic.     Right Ear: Tympanic membrane, ear canal and external ear normal.     Left Ear: Tympanic membrane, ear canal and external ear normal.     Mouth/Throat:     Mouth: Oropharynx is clear and moist.     Pharynx: No oropharyngeal exudate.  Eyes:     General: No scleral icterus.       Right eye: No discharge.        Left eye: No discharge.     Extraocular Movements: Extraocular movements intact and EOM normal.     Conjunctiva/sclera: Conjunctivae normal.     Pupils: Pupils are equal, round, and reactive to light.  Neck:     Thyroid: No thyromegaly.     Vascular: No carotid bruit or JVD.     Trachea: No tracheal deviation.  Cardiovascular:     Rate and Rhythm: Normal rate and regular rhythm.     Pulses: Normal pulses and intact distal pulses.     Heart sounds: Normal heart sounds. No murmur heard. No friction rub. No gallop.   Pulmonary:     Effort: Pulmonary effort is normal. No respiratory distress.  Breath sounds: Normal breath sounds. No wheezing or rales.  Chest:     Chest wall: No tenderness.  Abdominal:     General: Abdomen is flat. Bowel sounds are normal. There is no distension.     Palpations: Abdomen is soft. There is no mass.     Tenderness: There is no abdominal tenderness. There is no guarding  or rebound.  Musculoskeletal:        General: No edema. Normal range of motion.     Cervical back: Normal range of motion and neck supple. Tenderness present. No torticollis. Pain with movement and muscular tenderness (right upper neck muscles) present. No spinous process tenderness. Normal range of motion.  Lymphadenopathy:     Cervical: No cervical adenopathy.  Skin:    General: Skin is warm and dry.     Findings: No rash.  Neurological:     Mental Status: She is alert and oriented to person, place, and time.  Psychiatric:        Mood and Affect: Mood and affect normal.        Behavior: Behavior normal. Behavior is cooperative.        Thought Content: Thought content normal.        Judgment: Judgment normal.     Cognitive Testing - 6-CIT  Correct? Score   What year is it? yes 0 0 or 4  What month is it? yes 0 0 or 3  Memorize:    Pia Mau,  44,  [place] [address]  [place],      What time is it? (within 1 hour) yes 0 0 or 3  Count backwards from 20 yes 0 0, 2, or 4  Name the months of the year yes 0 0, 2, or 4  Repeat name & address above no 2 0, 2, 4, 6, 8, or 10       TOTAL SCORE  2/28   Interpretation:  Normal  Normal (0-7) Abnormal (8-28)     Last depression screening scores PHQ 2/9 Scores 03/18/2020 02/23/2020 02/20/2019  PHQ - 2 Score 0 0 0  PHQ- 9 Score 4 - 5   Last fall risk screening Fall Risk  02/23/2020  Falls in the past year? 0  Number falls in past yr: 0  Injury with Fall? 0  Follow up -   Last Audit-C alcohol use screening Alcohol Use Disorder Test (AUDIT) 02/23/2020  1. How often do you have a drink containing alcohol? 0  2. How many drinks containing alcohol do you have on a typical day when you are drinking? 0  3. How often do you have six or more drinks on one occasion? 0  AUDIT-C Score 0  Alcohol Brief Interventions/Follow-up AUDIT Score <7 follow-up not indicated   A score of 3 or more in women, and 4 or more in men indicates increased risk for  alcohol abuse, EXCEPT if all of the points are from question 1   No results found for any visits on 03/18/20.  Assessment & Plan    Routine Health Maintenance and Physical Exam  Exercise Activities and Dietary recommendations Goals    . Exercise 150 minutes per week (moderate activity)       Immunization History  Administered Date(s) Administered  . Fluad Quad(high Dose 65+) 10/20/2018  . Influenza Split 12/01/2005, 11/17/2007  . Influenza, High Dose Seasonal PF 10/25/2016, 10/21/2019  . Influenza-Unspecified 10/06/2013, 10/07/2015, 10/25/2016, 10/28/2017  . PFIZER(Purple Top)SARS-COV-2 Vaccination 02/27/2019, 03/20/2019, 11/09/2019  . Pneumococcal Conjugate-13  12/28/2013  . Pneumococcal Polysaccharide-23 05/31/2003  . Zoster 03/17/2008    Health Maintenance  Topic Date Due  . DEXA SCAN  01/12/2018  . TETANUS/TDAP  02/19/2029 (Originally 10/15/1954)  . INFLUENZA VACCINE  Completed  . COVID-19 Vaccine  Completed  . PNA vac Low Risk Adult  Completed    Discussed health benefits of physical activity, and encouraged her to engage in regular exercise appropriate for her age and condition.  1. Annual physical exam Normal physical exam today. Will check labs as below and f/u pending lab results. If labs are stable and WNL she will not need to have these rechecked for one year at her next annual physical exam. She is to call the office in the meantime if she has any acute issue, questions or concerns.  2. Primary hypertension Elevated today but patient feels may be from neck pain. Will continue to closely monitor as patient does have CAD and PAD. Continue Metoprolol. Will check labs as below and f/u pending results. - AMB Referral to [facility] - CBC w/Diff/Platelet - Comprehensive Metabolic Panel (CMET) - TSH - Lipid Panel With LDL/HDL Ratio - HgB A1c - metoprolol tartrate (LOPRESSOR) 25 MG tablet; Take 1 tablet (25 mg total) by mouth 2 (two) times daily.   Dispense: 180 tablet; Refill: 1  3. Mixed simple and mucopurulent chronic bronchitis (HCC) Stable. Only needing albuterol prn now.  - AMB Referral to [facility]  4. OSA on CPAP Uses CPAP nightly. Has improvement in symptoms with use.  - AMB Referral to [facility]  5. Stage 3a chronic kidney disease (HCC) Stable. Will check labs as below and f/u pending results. - AMB Referral to [facility] - CBC w/Diff/Platelet - Comprehensive Metabolic Panel (CMET) - TSH - Lipid Panel With LDL/HDL Ratio - HgB A1c  6. Pure hypercholesterolemia Stable. Diagnosis pulled for medication refill. Continue current medical treatment plan. Will check labs as below and f/u pending results. - [facility] - Comprehensive Metabolic Panel (CMET) - TSH - Lipid Panel With LDL/HDL Ratio - HgB A1c - simvastatin (ZOCOR) 40 MG tablet; TAKE 1 TABLET BY MOUTH EVERYDAY AT BEDTIME  Dispense: 90 tablet; Refill: 1  7. Blood glucose elevated Diet controlled. Will check labs as below and f/u pending results. - AMB Referral to [facility] - Comprehensive Metabolic Panel (CMET) - TSH - Lipid Panel With LDL/HDL Ratio - HgB A1c  8. Aortic atherosclerosis (HCC) Continue simvastatin. Will check labs as below and f/u pending results. - Lipid Panel With LDL/HDL Ratio  9. Neck pain Worsening issue. Right side bothers most. Will get imaging as below and f/u pending results. Discussed possibly adding Tizanidine 2mg .  - DG Cervical Spine Complete; Future  10. Hand arthritis Worsening. Discussed using Tumeric for arthritic inflammation.   11. Memory loss Husband reports some slight decline. Patient's mother did have Alzheimer's disease. 6CIT and acute memory today are normal. No acute findings.  No follow-ups on file.     Reynolds Bowl, PA-C, have reviewed all documentation for this visit. The documentation on 03/18/20  for the exam, diagnosis, procedures, and orders are all accurate and complete.   Rubye Beach  [facility] [phone] (phone) [phone] (fax)  [place]

## 2020-03-18 NOTE — Patient Instructions (Addendum)
Tumeric can take $Remove'500mg'TSDZimW$  daily for inflammation   Preventive Care 83 Years and Older, Female Preventive care refers to lifestyle choices and visits with your health care provider that can promote health and wellness. This includes:  A yearly physical exam. This is also called an annual wellness visit.  Regular dental and eye exams.  Immunizations.  Screening for certain conditions.  Healthy lifestyle choices, such as: ? Eating a healthy diet. ? Getting regular exercise. ? Not using drugs or products that contain nicotine and tobacco. ? Limiting alcohol use. What can I expect for my preventive care visit? Physical exam Your health care provider will check your:  Height and weight. These may be used to calculate your BMI (body mass index). BMI is a measurement that tells if you are at a healthy weight.  Heart rate and blood pressure.  Body temperature.  Skin for abnormal spots. Counseling Your health care provider may ask you questions about your:  Past medical problems.  Family's medical history.  Alcohol, tobacco, and drug use.  Emotional well-being.  Home life and relationship well-being.  Sexual activity.  Diet, exercise, and sleep habits.  History of falls.  Memory and ability to understand (cognition).  Work and work Statistician.  Pregnancy and menstrual history.  Access to firearms. What immunizations do I need? Vaccines are usually given at various ages, according to a schedule. Your health care provider will recommend vaccines for you based on your age, medical history, and lifestyle or other factors, such as travel or where you work.   What tests do I need? Blood tests  Lipid and cholesterol levels. These may be checked every 5 years, or more often depending on your overall health.  Hepatitis C test.  Hepatitis B test. Screening  Lung cancer screening. You may have this screening every year starting at age 22 if you have a 30-pack-year history  of smoking and currently smoke or have quit within the past 15 years.  Colorectal cancer screening. ? All adults should have this screening starting at age 36 and continuing until age 39. ? Your health care provider may recommend screening at age 11 if you are at increased risk. ? You will have tests every 1-10 years, depending on your results and the type of screening test.  Diabetes screening. ? This is done by checking your blood sugar (glucose) after you have not eaten for a while (fasting). ? You may have this done every 1-3 years.  Mammogram. ? This may be done every 1-2 years. ? Talk with your health care provider about how often you should have regular mammograms.  Abdominal aortic aneurysm (AAA) screening. You may need this if you are a current or former smoker.  BRCA-related cancer screening. This may be done if you have a family history of breast, ovarian, tubal, or peritoneal cancers. Other tests  STD (sexually transmitted disease) testing, if you are at risk.  Bone density scan. This is done to screen for osteoporosis. You may have this done starting at age 56. Talk with your health care provider about your test results, treatment options, and if necessary, the need for more tests. Follow these instructions at home: Eating and drinking  Eat a diet that includes fresh fruits and vegetables, whole grains, lean protein, and low-fat dairy products. Limit your intake of foods with high amounts of sugar, saturated fats, and salt.  Take vitamin and mineral supplements as recommended by your health care provider.  Do not drink alcohol if your  health care provider tells you not to drink.  If you drink alcohol: ? Limit how much you have to 0-1 drink a day. ? Be aware of how much alcohol is in your drink. In the [place] one drink equals one 12 oz bottle of beer (355 mL), one 5 oz glass of wine (148 mL), or one 1 oz glass of hard liquor (44 mL).   Lifestyle  Take daily care of  your teeth and gums. Brush your teeth every morning and night with fluoride toothpaste. Floss one time each day.  Stay active. Exercise for at least 30 minutes 5 or more days each week.  Do not use any products that contain nicotine or tobacco, such as cigarettes, e-cigarettes, and chewing tobacco. If you need help quitting, ask your health care provider.  Do not use drugs.  If you are sexually active, practice safe sex. Use a condom or other form of protection in order to prevent STIs (sexually transmitted infections).  Talk with your health care provider about taking a low-dose aspirin or statin.  Find healthy ways to cope with stress, such as: ? Meditation, yoga, or listening to music. ? Journaling. ? Talking to a trusted person. ? Spending time with friends and family. Safety  Always wear your seat belt while driving or riding in a vehicle.  Do not drive: ? If you have been drinking alcohol. Do not ride with someone who has been drinking. ? When you are tired or distracted. ? While texting.  Wear a helmet and other protective equipment during sports activities.  If you have firearms in your house, make sure you follow all gun safety procedures. What's next?  Visit your health care provider once a year for an annual wellness visit.  Ask your health care provider how often you should have your eyes and teeth checked.  Stay up to date on all vaccines. This information is not intended to replace advice given to you by your health care provider. Make sure you discuss any questions you have with your health care provider. Document Revised: 01/13/2020 Document Reviewed: 01/16/2018 Elsevier Patient Education  2021 [employer].

## 2020-03-19 LAB — CBC WITH DIFFERENTIAL/PLATELET
Basophils Absolute: 0 10*3/uL (ref 0.0–0.2)
Basos: 1 %
EOS (ABSOLUTE): 0.1 10*3/uL (ref 0.0–0.4)
Eos: 2 %
Hematocrit: 40 % (ref 34.0–46.6)
Hemoglobin: 13.4 g/dL (ref 11.1–15.9)
Immature Grans (Abs): 0 10*3/uL (ref 0.0–0.1)
Immature Granulocytes: 0 %
Lymphocytes Absolute: 1.3 10*3/uL (ref 0.7–3.1)
Lymphs: 22 %
MCH: 34.6 pg — ABNORMAL HIGH (ref 26.6–33.0)
MCHC: 33.5 g/dL (ref 31.5–35.7)
MCV: 103 fL — ABNORMAL HIGH (ref 79–97)
Monocytes Absolute: 0.4 10*3/uL (ref 0.1–0.9)
Monocytes: 8 %
Neutrophils Absolute: 4 10*3/uL (ref 1.4–7.0)
Neutrophils: 67 %
Platelets: 153 10*3/uL (ref 150–450)
RBC: 3.87 x10E6/uL (ref 3.77–5.28)
RDW: 12.1 % (ref 11.7–15.4)
WBC: 5.8 10*3/uL (ref 3.4–10.8)

## 2020-03-19 LAB — COMPREHENSIVE METABOLIC PANEL
ALT: 13 IU/L (ref 0–32)
AST: 22 IU/L (ref 0–40)
Albumin/Globulin Ratio: 2.3 — ABNORMAL HIGH (ref 1.2–2.2)
Albumin: 4.5 g/dL (ref 3.6–4.6)
Alkaline Phosphatase: 92 IU/L (ref 44–121)
BUN/Creatinine Ratio: 19 (ref 12–28)
BUN: 23 mg/dL (ref 8–27)
Bilirubin Total: 0.5 mg/dL (ref 0.0–1.2)
CO2: 22 mmol/L (ref 20–29)
Calcium: 9.3 mg/dL (ref 8.7–10.3)
Chloride: 103 mmol/L (ref 96–106)
Creatinine, Ser: 1.24 mg/dL — ABNORMAL HIGH (ref 0.57–1.00)
GFR calc Af Amer: 47 mL/min/{1.73_m2} — ABNORMAL LOW (ref >59)
GFR calc non Af Amer: 41 mL/min/{1.73_m2} — ABNORMAL LOW (ref >59)
Globulin, Total: 2 g/dL (ref 1.5–4.5)
Glucose: 97 mg/dL (ref 65–99)
Potassium: 4.7 mmol/L (ref 3.5–5.2)
Sodium: 142 mmol/L (ref 134–144)
Total Protein: 6.5 g/dL (ref 6.0–8.5)

## 2020-03-19 LAB — LIPID PANEL WITH LDL/HDL RATIO
Cholesterol, Total: 159 mg/dL (ref 100–199)
HDL: 59 mg/dL (ref >39)
LDL Chol Calc (NIH): 75 mg/dL (ref 0–99)
LDL/HDL Ratio: 1.3 ratio (ref 0.0–3.2)
Triglycerides: 143 mg/dL (ref 0–149)
VLDL Cholesterol Cal: 25 mg/dL (ref 5–40)

## 2020-03-19 LAB — HEMOGLOBIN A1C
Est. average glucose Bld gHb Est-mCnc: 123 mg/dL
Hgb A1c MFr Bld: 5.9 % — ABNORMAL HIGH (ref 4.8–5.6)

## 2020-03-19 LAB — TSH: TSH: 4.59 u[IU]/mL — ABNORMAL HIGH (ref 0.450–4.500)

## 2020-03-21 ENCOUNTER — Telehealth: Payer: Self-pay | Admitting: *Deleted

## 2020-03-21 NOTE — Chronic Care Management (AMB) (Signed)
  Chronic Care Management   Note  03/21/2020 Name: Christina [name] MRN: [id number] DOB: 05-Oct-1935  [name] is a 85 y.o. year old female who is a primary care patient of Rubye Beach. I reached out to [name] by phone today in response to a referral sent by Christina [name]'s PCP,[place], Clearnce Sorrel, PA-C.  Christina [name] was given information about Chronic Care Management services today including:  1. CCM service includes personalized support from designated clinical staff supervised by her physician, including individualized plan of care and coordination with other care providers 2. 24/7 contact phone numbers for assistance for urgent and routine care needs. 3. Service will only be billed when office clinical staff spend 20 minutes or more in a month to coordinate care. 4. Only one practitioner may furnish and bill the service in a calendar month. 5. The patient may stop CCM services at any time (effective at the end of the month) by phone call to the office staff. 6. The patient will be responsible for cost sharing (co-pay) of up to 20% of the service fee (after annual deductible is met).  Patient agreed to services and verbal consent obtained.   Follow up plan: Telephone appointment with care management team member scheduled for:04/05/2020  [place] [facility]

## 2020-03-22 ENCOUNTER — Telehealth: Payer: Self-pay

## 2020-03-22 NOTE — Telephone Encounter (Signed)
-----   Message from Mar Daring, [place] sent at 03/18/2020  5:33 PM EST ----- Severe neck arthritis and associated arthritic changes. This would definitely cause neck pain. Could consider physical therapy to see if could benefit and improve pain. Other option would be referral to orthopedics to consider steroid injections, but I feel that PT would be best option initially.   Also noted to have carotid artery calcifications. I will order a carotid duplex ([place]) for better evaluation. They will call to schedule this appt.

## 2020-03-22 NOTE — Telephone Encounter (Signed)
Pt advised.  She would like to go ahead with the [place] but will call back about the PT.   Thanks,   -Mickel Baas

## 2020-03-23 ENCOUNTER — Telehealth: Payer: Self-pay

## 2020-03-23 DIAGNOSIS — N1831 Chronic kidney disease, stage 3a: Secondary | ICD-10-CM

## 2020-03-23 NOTE — Telephone Encounter (Signed)
Patient advised. She is going to come in to have labs check in 2 weeks. Labs ordered.

## 2020-03-23 NOTE — Telephone Encounter (Signed)
-----   Message from Mar Daring, [place] sent at 03/23/2020  8:18 AM EST ----- Mardene Celeste,  Kidney function has slightly declined compared to last check. This is most likely from dehydration with fasting for labs. I would recommend to push fluids and recheck non-fasting in the coming 2 weeks. A1c did increase as well from 5.8 to now 5.9. Limit sugars in diet. All other labs are normal and stable.   Grace Bushy, [facility]

## 2020-03-24 ENCOUNTER — Telehealth: Payer: Self-pay

## 2020-03-24 NOTE — Telephone Encounter (Signed)
Copied from [place] [id number]. Topic: General - Inquiry >> Mar 24, 2020  2:25 PM Greggory Keen D wrote: Reason for CRM: Pt called asking if she can talk to [facility] about getting the [place] scheduled.  She said she is wanting to proceed with that.  CB#  [phone]

## 2020-03-25 NOTE — Telephone Encounter (Signed)
The carotid US was ordered on 03/18/20. They should call to schedule. She could call radiology herself to see if they could schedule it.

## 2020-03-28 NOTE — Telephone Encounter (Signed)
Pt called and stated she will call radiology /but she will get the ultrasound through her cardiologist / pt asked for a call a nurse /please advise

## 2020-03-29 NOTE — Telephone Encounter (Signed)
Pt has appt on 3/16 with her cardiologist.  She states she is going to discuss the matter with him and see he he wants her to have the [place]. She states she was just trying to get ahead of things. Nothing further needed.

## 2020-03-29 NOTE — Telephone Encounter (Signed)
Left message for pt to call back.  03/29/2020.  PEC please try and get more information. Does she already have the U/S scheduled through cardiology?    Thanks,   -Mickel Baas

## 2020-04-05 ENCOUNTER — Telehealth: Payer: Medicare Other

## 2020-04-05 ENCOUNTER — Telehealth: Payer: Self-pay

## 2020-04-05 NOTE — Telephone Encounter (Signed)
  Chronic Care Management   Outreach Note  04/05/2020 Name: ELISABELLA HACKER MRN: [id number] DOB: 06-19-1935  Primary Care Provider: Mar Daring, PA-C Reason for referral : Chronic Care Management   An unsuccessful telephone outreach was attempted today. Mrs. Slee was referred to the case management team for assistance with care management and care coordination.    Follow Up Plan:  A HIPAA compliant voice message was left today requesting a return call.    [facility] Care Management [facility] [phone]

## 2020-04-12 DIAGNOSIS — H43813 Vitreous degeneration, bilateral: Secondary | ICD-10-CM | POA: Diagnosis not present

## 2020-04-12 DIAGNOSIS — H353213 Exudative age-related macular degeneration, right eye, with inactive scar: Secondary | ICD-10-CM | POA: Diagnosis not present

## 2020-04-12 DIAGNOSIS — H35432 Paving stone degeneration of retina, left eye: Secondary | ICD-10-CM | POA: Diagnosis not present

## 2020-04-12 DIAGNOSIS — H353221 Exudative age-related macular degeneration, left eye, with active choroidal neovascularization: Secondary | ICD-10-CM | POA: Diagnosis not present

## 2020-04-13 ENCOUNTER — Telehealth: Payer: Self-pay

## 2020-04-13 DIAGNOSIS — G4733 Obstructive sleep apnea (adult) (pediatric): Secondary | ICD-10-CM | POA: Diagnosis not present

## 2020-04-13 NOTE — Telephone Encounter (Signed)
  Chronic Care Management   Outreach Note  04/13/2020 Name: CORLEEN OTWELL MRN: [id number] DOB: May 08, 1935  Primary Care Provider: Mar Daring, PA-C Reason for referral : Chronic Care Management   An unsuccessful telephone outreach was attempted today. Ms. Savino was referred to the case management team for assistance with care management and care coordination.     Follow Up Plan:  A HIPAA compliant voice message was left today requesting a return call.     [facility] [phone]

## 2020-04-17 DIAGNOSIS — J019 Acute sinusitis, unspecified: Secondary | ICD-10-CM | POA: Diagnosis not present

## 2020-04-17 DIAGNOSIS — B9689 Other specified bacterial agents as the cause of diseases classified elsewhere: Secondary | ICD-10-CM | POA: Diagnosis not present

## 2020-04-17 DIAGNOSIS — R059 Cough, unspecified: Secondary | ICD-10-CM | POA: Diagnosis not present

## 2020-04-19 DIAGNOSIS — G4733 Obstructive sleep apnea (adult) (pediatric): Secondary | ICD-10-CM | POA: Diagnosis not present

## 2020-04-19 DIAGNOSIS — J449 Chronic obstructive pulmonary disease, unspecified: Secondary | ICD-10-CM | POA: Diagnosis not present

## 2020-04-19 DIAGNOSIS — Z951 Presence of aortocoronary bypass graft: Secondary | ICD-10-CM | POA: Diagnosis not present

## 2020-04-19 DIAGNOSIS — E782 Mixed hyperlipidemia: Secondary | ICD-10-CM | POA: Diagnosis not present

## 2020-04-19 DIAGNOSIS — I739 Peripheral vascular disease, unspecified: Secondary | ICD-10-CM | POA: Diagnosis not present

## 2020-04-19 DIAGNOSIS — R0609 Other forms of dyspnea: Secondary | ICD-10-CM | POA: Diagnosis not present

## 2020-04-19 DIAGNOSIS — I25118 Atherosclerotic heart disease of native coronary artery with other forms of angina pectoris: Secondary | ICD-10-CM | POA: Diagnosis not present

## 2020-04-19 DIAGNOSIS — I6523 Occlusion and stenosis of bilateral carotid arteries: Secondary | ICD-10-CM | POA: Diagnosis not present

## 2020-04-19 DIAGNOSIS — I10 Essential (primary) hypertension: Secondary | ICD-10-CM | POA: Diagnosis not present

## 2020-05-02 ENCOUNTER — Telehealth: Payer: Self-pay

## 2020-05-02 ENCOUNTER — Ambulatory Visit: Payer: Self-pay

## 2020-05-02 NOTE — Chronic Care Management (AMB) (Signed)
  Chronic Care Management   Outreach Note  05/02/2020 Name: MIDA CORY MRN: [id number] DOB: 05/11/1935  Primary Care Provider: Mar Daring, PA-C Reason for referral : Chronic Care Management   Mrs. Band was referred to the care management team for assistance with chronic care management and care coordination. Her primary care provider will be notified of our unsuccessful attempts to maintain contact. The care management team will gladly outreach at any time in the future if she is interested in receiving assistance.    Follow Up Plan:  The care management team will gladly follow up with Mrs. Grandville Silos after the primary care provider has a conversation with her regarding recommendation for care management engagement and subsequent re-referral for care management services.     [facility] Care [facility] [phone]

## 2020-05-12 ENCOUNTER — Encounter: Payer: Self-pay | Admitting: Internal Medicine

## 2020-05-12 ENCOUNTER — Ambulatory Visit: Payer: Medicare Other | Admitting: Internal Medicine

## 2020-05-12 ENCOUNTER — Other Ambulatory Visit: Payer: Self-pay

## 2020-05-12 VITALS — BP 176/74 | HR 74 | Temp 98.1°F | Resp 16 | Ht 65.5 in | Wt 170.0 lb

## 2020-05-12 DIAGNOSIS — R0602 Shortness of breath: Secondary | ICD-10-CM | POA: Diagnosis not present

## 2020-05-12 DIAGNOSIS — J440 Chronic obstructive pulmonary disease with acute lower respiratory infection: Secondary | ICD-10-CM

## 2020-05-12 DIAGNOSIS — M159 Polyosteoarthritis, unspecified: Secondary | ICD-10-CM | POA: Diagnosis not present

## 2020-05-12 DIAGNOSIS — J209 Acute bronchitis, unspecified: Secondary | ICD-10-CM | POA: Diagnosis not present

## 2020-05-12 MED ORDER — ALBUTEROL SULFATE HFA 108 (90 BASE) MCG/ACT IN AERS: 2.0000 | INHALATION_SPRAY | Freq: Four times a day (QID) | RESPIRATORY_TRACT | 3 refills | Status: DC | PRN

## 2020-05-12 MED ORDER — TRAMADOL HCL 50 MG PO TABS: ORAL_TABLET | ORAL | 0 refills | Status: DC

## 2020-05-12 NOTE — Progress Notes (Signed)
[facility] [place], [place] [address]  Internal MEDICINE  Office Visit Note  Patient Name: Christina [name]  [id number]  [id number]  Date of Service: 05/26/2020  Chief Complaint  Patient presents with  . COPD    4 month f-up  . Sleep Apnea    HPI Pt is here for routine follow up.  She went to urgent care for sickness with some cough and congestion and was diagnosed to have acute copd exacerbation. She was started on antibiotics, does feel better. Sometimes she does notice sob and cough during the day, does not have a rescue inhaler.  She is also c/o back pain and hip pain, unable to sleep at night. Has appointment with PCP but will like to have something for pain as it is unbearable and it interferes  With her breathing at times    Current Medication: Outpatient Encounter Medications as of 05/12/2020  Medication Sig  . acetaminophen (TYLENOL) 500 MG tablet Take 500 mg by mouth every 8 (eight) hours as needed for mild pain or moderate pain.   . Ascorbic Acid (VITAMIN C) 1000 MG tablet Take 1,000 mg by mouth daily.  Marland Kitchen aspirin 81 MG tablet Take 81 mg by mouth daily.   . budesonide-formoterol (SYMBICORT) 160-4.5 MCG/ACT inhaler INHALE 1 PUFF BY MOUTH TWICE A DAY  . Calcium Carb-Cholecalciferol (CALCIUM 600+D3 PO) Take 1 tablet by mouth daily.  . metoprolol tartrate (LOPRESSOR) 25 MG tablet Take 1 tablet (25 mg total) by mouth 2 (two) times daily.  . Multiple Vitamin (MULTI-VITAMIN DAILY PO) Take by mouth.  . Multiple Vitamins-Minerals (VITAMIN D3 COMPLETE PO) Take by mouth.  . pantoprazole (PROTONIX) 40 MG tablet TAKE 1 TABLET (40 MG TOTAL) BY MOUTH ONCE DAILY TAKE 30 MINUTES BEFORE BREAKFAST.  Marland Kitchen simvastatin (ZOCOR) 40 MG tablet TAKE 1 TABLET BY MOUTH EVERYDAY AT BEDTIME  . tobramycin (TOBREX) 0.3 % ophthalmic solution INSTILL 1 DROP IN LEFT EYE 4 TIMES/DAY BEGIN 1 DAY BEFORE TREATMENT DAY OF TREATMENT AND 1 DAY AFTER  . traMADol (ULTRAM) 50 MG tablet Take  one tab at night for arthritis pain  . Turmeric (QC TUMERIC COMPLEX PO) Take by mouth.  . [DISCONTINUED] albuterol (VENTOLIN HFA) 108 (90 Base) MCG/ACT inhaler Inhale 2 puffs into the lungs every 6 (six) hours as needed for wheezing or shortness of breath.  Marland Kitchen albuterol (VENTOLIN HFA) 108 (90 Base) MCG/ACT inhaler Inhale 2 puffs into the lungs every 6 (six) hours as needed for wheezing or shortness of breath.   No facility-administered encounter medications on file as of 05/12/2020.    Surgical History: Past Surgical History:  Procedure Laterality Date  . ABDOMINAL HYSTERECTOMY  1980   due to dysfunctional uterine bleeding  . APPENDECTOMY  1980  . BREAST SURGERY Left 2000   biopsy  . CARDIAC CATHETERIZATION    . COLONOSCOPY    . CORONARY ARTERY BYPASS GRAFT  2006  . HEMORRHOID SURGERY    . LOWER EXTREMITY ANGIOGRAPHY Right 02/25/2017   Procedure: LOWER EXTREMITY ANGIOGRAPHY;  Surgeon: Algernon Huxley, MD;  Location: [place] [facility];  Service: Cardiovascular;  Laterality: Right;  . LOWER EXTREMITY INTERVENTION  02/25/2017   Procedure: LOWER EXTREMITY INTERVENTION;  Surgeon: Algernon Huxley, MD;  Location: [place] [facility];  Service: Cardiovascular;;  . LUNG BIOPSY  1999   Negative  . TONSILLECTOMY      Medical History: Past Medical History:  Diagnosis Date  . Arthritis   . COPD (chronic obstructive  pulmonary disease) ([place])   . Coronary artery disease   . GERD (gastroesophageal reflux disease)   . Hyperlipidemia   . Hypertension   . Macular degeneration   . PAD (peripheral artery disease) ([place])   . Sleep apnea     Family History: Family History  Problem Relation Age of Onset  . Hypertension Mother   . Hyperlipidemia Mother   . Alzheimer's disease Mother   . CAD Mother   . Heart attack Father   . Lung disease Sister   . Heart disease Sister   . Diabetes Paternal Grandfather        Type 2  . Hearing loss Son     Social History   Socioeconomic History  . Marital  status: Married    Spouse name: Cliffton Mathis Bud  . Number of children: 2  . Years of education: 45  . Highest education level: 12th grade  Occupational History  . Occupation: retired  Tobacco Use  . Smoking status: Former Smoker    Packs/day: 0.50    Years: 5.00    Pack years: 2.50    Quit date: 04/24/2004    Years since quitting: 16.0  . Smokeless tobacco: Never Used  Vaping Use  . Vaping Use: Never used  Substance and Sexual Activity  . Alcohol use: No  . Drug use: No  . Sexual activity: Yes  Other Topics Concern  . Not on file  Social History Narrative  . Not on file   Social Determinants of Health   Financial Resource Strain: Low Risk   . Difficulty of Paying Living Expenses: Not hard at all  Food Insecurity: No Food Insecurity  . Worried About Charity fundraiser in the Last Year: Never true  . Ran Out of Food in the Last Year: Never true  Transportation Needs: No Transportation Needs  . Lack of Transportation (Medical): No  . Lack of Transportation (Non-Medical): No  Physical Activity: Inactive  . Days of Exercise per Week: 0 days  . Minutes of Exercise per Session: 0 min  Stress: No Stress Concern Present  . Feeling of Stress : Not at all  Social Connections: Moderately Integrated  . Frequency of Communication with Friends and Family: Three times a week  . Frequency of Social Gatherings with Friends and Family: More than three times a week  . Attends Religious Services: More than 4 times per year  . Active Member of Clubs or Organizations: No  . Attends Archivist Meetings: Never  . Marital Status: Married  Human resources officer Violence: Not At Risk  . Fear of Current or Ex-Partner: No  . Emotionally Abused: No  . Physically Abused: No  . Sexually Abused: No      Review of Systems  Constitutional: Negative for chills and fatigue.  HENT: Positive for postnasal drip. Negative for congestion, rhinorrhea, sneezing and sore throat.   Eyes:  Negative for redness.  Respiratory: Positive for cough and shortness of breath. Negative for chest tightness.   Cardiovascular: Negative for chest pain and palpitations.  Gastrointestinal: Negative for vomiting.  Genitourinary: Negative for flank pain and frequency.  Musculoskeletal: Positive for arthralgias, back pain and gait problem. Negative for neck pain.  Skin: Negative for rash.  Neurological: Positive for dizziness. Negative for tremors and numbness.  Psychiatric/Behavioral: Negative for behavioral problems (Depression), sleep disturbance and suicidal ideas. The patient is not nervous/anxious.     Vital Signs: BP (!) 176/74   Pulse 74   Temp 98.1  F (36.7 C)   Resp 16   Ht 5' 5.5" (1.664 m)   Wt 170 lb (77.1 kg)   SpO2 96%   BMI 27.86 kg/m    Physical Exam Constitutional:      Appearance: Normal appearance.  HENT:     Head: Normocephalic and atraumatic.     Mouth/Throat:     Mouth: Mucous membranes are moist.  Eyes:     Extraocular Movements: Extraocular movements intact.  Cardiovascular:     Rate and Rhythm: Normal rate and regular rhythm.     Pulses: Normal pulses.     Heart sounds: Normal heart sounds.  Pulmonary:     Effort: Pulmonary effort is normal.     Breath sounds: Wheezing present.  Neurological:     General: No focal deficit present.     Mental Status: She is alert and oriented to person, place, and time.  Psychiatric:        Mood and Affect: Mood normal.        Thought Content: Thought content normal.     Assessment/Plan: 1. Acute bronchitis with COPD ([place]) Pt is improving, however does need a rescue inhaler for acute events, continue Symbicort as before  - Spirometry with Graph - albuterol (VENTOLIN HFA) 108 (90 Base) MCG/ACT inhaler; Inhale 2 puffs into the lungs every 6 (six) hours as needed for wheezing or shortness of breath.  Dispense: 18 g; Refill: 3  2. Osteoarthritis, generalized Instructed pt to contact her PCP. Will give a  limited supply for acute pain  - traMADol (ULTRAM) 50 MG tablet; Take one tab at night for arthritis pain  Dispense: 15 tablet; Refill: 0  General Counseling: candia kingsbury understanding of the findings of todays visit and agrees with plan of treatment. I have discussed any further diagnostic evaluation that may be needed or ordered today. We also reviewed her medications today. she has been encouraged to call the office with any questions or concerns that should arise related to todays visit.    Orders Placed This Encounter  Procedures  . Spirometry with Graph    Meds ordered this encounter  Medications  . albuterol (VENTOLIN HFA) 108 (90 Base) MCG/ACT inhaler    Sig: Inhale 2 puffs into the lungs every 6 (six) hours as needed for wheezing or shortness of breath.    Dispense:  18 g    Refill:  3  . traMADol (ULTRAM) 50 MG tablet    Sig: Take one tab at night for arthritis pain    Dispense:  15 tablet    Refill:  0    Total time spent 30 Minutes Time spent includes review of chart, medications, test results, and follow up plan with the patient.   [place] Controlled Substance Database was reviewed by me.   Dr Lavera Guise Internal medicine

## 2020-06-08 ENCOUNTER — Other Ambulatory Visit: Payer: Self-pay

## 2020-06-08 ENCOUNTER — Ambulatory Visit: Payer: Medicare Other

## 2020-06-08 DIAGNOSIS — G4733 Obstructive sleep apnea (adult) (pediatric): Secondary | ICD-10-CM

## 2020-06-08 NOTE — Progress Notes (Signed)
95 percentile pressure 12   95th percentile leak 83.1   apnea index 1.9 /hr  apnea-hypopnea index  3.1 /hr   total days used  >4 hr 90 days  total days used <4 hr 0 days  Total compliance 100 percent  She is doing great wearing cpap no problems or questions. She will be eligible for new cpap She will speak to dr at her next appt.

## 2020-06-20 DIAGNOSIS — R0609 Other forms of dyspnea: Secondary | ICD-10-CM | POA: Diagnosis not present

## 2020-06-20 DIAGNOSIS — I6523 Occlusion and stenosis of bilateral carotid arteries: Secondary | ICD-10-CM | POA: Diagnosis not present

## 2020-06-21 DIAGNOSIS — H353221 Exudative age-related macular degeneration, left eye, with active choroidal neovascularization: Secondary | ICD-10-CM | POA: Diagnosis not present

## 2020-06-21 DIAGNOSIS — H35432 Paving stone degeneration of retina, left eye: Secondary | ICD-10-CM | POA: Diagnosis not present

## 2020-06-21 DIAGNOSIS — H353213 Exudative age-related macular degeneration, right eye, with inactive scar: Secondary | ICD-10-CM | POA: Diagnosis not present

## 2020-06-21 DIAGNOSIS — H43813 Vitreous degeneration, bilateral: Secondary | ICD-10-CM | POA: Diagnosis not present

## 2020-06-28 DIAGNOSIS — R609 Edema, unspecified: Secondary | ICD-10-CM | POA: Diagnosis not present

## 2020-06-28 DIAGNOSIS — J449 Chronic obstructive pulmonary disease, unspecified: Secondary | ICD-10-CM | POA: Diagnosis not present

## 2020-06-28 DIAGNOSIS — R0609 Other forms of dyspnea: Secondary | ICD-10-CM | POA: Diagnosis not present

## 2020-06-28 DIAGNOSIS — I10 Essential (primary) hypertension: Secondary | ICD-10-CM | POA: Diagnosis not present

## 2020-06-28 DIAGNOSIS — I071 Rheumatic tricuspid insufficiency: Secondary | ICD-10-CM | POA: Diagnosis not present

## 2020-06-28 DIAGNOSIS — I739 Peripheral vascular disease, unspecified: Secondary | ICD-10-CM | POA: Diagnosis not present

## 2020-06-28 DIAGNOSIS — I6523 Occlusion and stenosis of bilateral carotid arteries: Secondary | ICD-10-CM | POA: Diagnosis not present

## 2020-06-28 DIAGNOSIS — E782 Mixed hyperlipidemia: Secondary | ICD-10-CM | POA: Diagnosis not present

## 2020-06-28 DIAGNOSIS — R42 Dizziness and giddiness: Secondary | ICD-10-CM | POA: Diagnosis not present

## 2020-06-28 DIAGNOSIS — I25118 Atherosclerotic heart disease of native coronary artery with other forms of angina pectoris: Secondary | ICD-10-CM | POA: Diagnosis not present

## 2020-06-28 DIAGNOSIS — G4733 Obstructive sleep apnea (adult) (pediatric): Secondary | ICD-10-CM | POA: Diagnosis not present

## 2020-06-28 DIAGNOSIS — Z951 Presence of aortocoronary bypass graft: Secondary | ICD-10-CM | POA: Diagnosis not present

## 2020-06-29 ENCOUNTER — Telehealth (INDEPENDENT_AMBULATORY_CARE_PROVIDER_SITE_OTHER): Payer: Self-pay

## 2020-06-30 NOTE — Telephone Encounter (Signed)
Documentation only.

## 2020-07-14 ENCOUNTER — Other Ambulatory Visit (INDEPENDENT_AMBULATORY_CARE_PROVIDER_SITE_OTHER): Payer: Self-pay | Admitting: Vascular Surgery

## 2020-07-14 DIAGNOSIS — I6529 Occlusion and stenosis of unspecified carotid artery: Secondary | ICD-10-CM

## 2020-07-18 ENCOUNTER — Encounter (INDEPENDENT_AMBULATORY_CARE_PROVIDER_SITE_OTHER): Payer: Self-pay | Admitting: Vascular Surgery

## 2020-07-18 ENCOUNTER — Other Ambulatory Visit: Payer: Self-pay

## 2020-07-18 ENCOUNTER — Ambulatory Visit (INDEPENDENT_AMBULATORY_CARE_PROVIDER_SITE_OTHER): Payer: Medicare Other | Admitting: Vascular Surgery

## 2020-07-18 ENCOUNTER — Ambulatory Visit (INDEPENDENT_AMBULATORY_CARE_PROVIDER_SITE_OTHER): Payer: Medicare Other

## 2020-07-18 VITALS — BP 183/67 | HR 66 | Ht 60.0 in | Wt 167.0 lb

## 2020-07-18 DIAGNOSIS — I739 Peripheral vascular disease, unspecified: Secondary | ICD-10-CM

## 2020-07-18 DIAGNOSIS — J418 Mixed simple and mucopurulent chronic bronchitis: Secondary | ICD-10-CM | POA: Diagnosis not present

## 2020-07-18 DIAGNOSIS — I6529 Occlusion and stenosis of unspecified carotid artery: Secondary | ICD-10-CM

## 2020-07-18 DIAGNOSIS — I10 Essential (primary) hypertension: Secondary | ICD-10-CM | POA: Diagnosis not present

## 2020-07-18 DIAGNOSIS — I2510 Atherosclerotic heart disease of native coronary artery without angina pectoris: Secondary | ICD-10-CM | POA: Diagnosis not present

## 2020-07-18 DIAGNOSIS — I6523 Occlusion and stenosis of bilateral carotid arteries: Secondary | ICD-10-CM | POA: Diagnosis not present

## 2020-07-18 NOTE — Progress Notes (Signed)
MRN : [id number]  Christina [name] is a 85 y.o. (03-07-1935) female who presents with chief complaint of No chief complaint on file. Marland Kitchen  History of Present Illness:  The patient is seen for evaluation of carotid stenosis. The carotid stenosis was identified remotely but has not been followed recently.  The patient denies amaurosis fugax. There is no recent history of TIA symptoms or focal motor deficits. There is no prior documented CVA.  There is no history of migraine headaches. There is no history of seizures.  The patient is taking enteric-coated aspirin 81 mg daily.  The patient has a history of coronary artery disease, no recent episodes of angina or shortness of breath. The patient denies PAD or claudication symptoms. There is a history of hyperlipidemia which is being treated with a statin.    Duplex ultrasound of the carotid arteries shows RICA <40% and LICA 98-11% No significant change compared to last study.  No outpatient medications have been marked as taking for the 07/18/20 encounter (Appointment) with [name], Dolores Lory, MD.    Past Medical History:  Diagnosis Date   Arthritis    COPD (chronic obstructive pulmonary disease) ([place])    Coronary artery disease    GERD (gastroesophageal reflux disease)    Hyperlipidemia    Hypertension    Macular degeneration    PAD (peripheral artery disease) ([place])    Sleep apnea     Past Surgical History:  Procedure Laterality Date   ABDOMINAL HYSTERECTOMY  1980   due to dysfunctional uterine bleeding   APPENDECTOMY  1980   BREAST SURGERY Left 2000   biopsy   CARDIAC CATHETERIZATION     COLONOSCOPY     CORONARY ARTERY BYPASS GRAFT  2006   HEMORRHOID SURGERY     LOWER EXTREMITY ANGIOGRAPHY Right 02/25/2017   Procedure: LOWER EXTREMITY ANGIOGRAPHY;  Surgeon: Algernon Huxley, MD;  Location: [place] [facility];  Service: Cardiovascular;  Laterality: Right;   LOWER EXTREMITY INTERVENTION  02/25/2017   Procedure: LOWER  EXTREMITY INTERVENTION;  Surgeon: Algernon Huxley, MD;  Location: [place] [facility];  Service: Cardiovascular;;   LUNG BIOPSY  1999   Negative   TONSILLECTOMY      Social History Social History   Tobacco Use   Smoking status: Former    Packs/day: 0.50    Years: 5.00    Pack years: 2.50    Types: Cigarettes    Quit date: 04/24/2004    Years since quitting: 16.2   Smokeless tobacco: Never  Vaping Use   Vaping Use: Never used  Substance Use Topics   Alcohol use: No   Drug use: No    Family History Family History  Problem Relation Age of Onset   Hypertension Mother    Hyperlipidemia Mother    Alzheimer's disease Mother    CAD Mother    Heart attack Father    Lung disease Sister    Heart disease Sister    Diabetes Paternal Grandfather        Type 2   Hearing loss Son   No family history of bleeding/clotting disorders, porphyria or autoimmune disease   Allergies  Allergen Reactions   Baclofen Other (See Comments)   Codeine Nausea Only   Plavix [Clopidogrel]     brusing all over   Sulfa Antibiotics Nausea Only   Latex Rash     REVIEW OF SYSTEMS (Negative unless checked)  Constitutional: [] Weight loss  [] Fever  [] Chills Cardiac: [] Chest pain   []   Chest pressure   [] Palpitations   [] Shortness of breath when laying flat   [] Shortness of breath with exertion. Vascular:  [] Pain in legs with walking   [] Pain in legs at rest  [] History of DVT   [] Phlebitis   [] Swelling in legs   [] Varicose veins   [] Non-healing ulcers Pulmonary:   [] Uses home oxygen   [] Productive cough   [] Hemoptysis   [] Wheeze  [] COPD   [] Asthma Neurologic:  [] Dizziness   [] Seizures   [] History of stroke   [] History of TIA  [] Aphasia   [] Vissual changes   [] Weakness or numbness in arm   [] Weakness or numbness in leg Musculoskeletal:   [] Joint swelling   [] Joint pain   [] Low back pain Hematologic:  [] Easy bruising  [] Easy bleeding   [] Hypercoagulable state   [] Anemic Gastrointestinal:  [] Diarrhea    [] Vomiting  [] Gastroesophageal reflux/heartburn   [] Difficulty swallowing. Genitourinary:  [] Chronic kidney disease   [] Difficult urination  [] Frequent urination   [] Blood in urine Skin:  [] Rashes   [] Ulcers  Psychological:  [] History of anxiety   []  History of major depression.  Physical Examination  There were no vitals filed for this visit. There is no height or weight on file to calculate BMI. Gen: WD/WN, NAD Head: [place]/AT, No temporalis wasting.  Ear/Nose/Throat: Hearing grossly intact, nares w/o erythema or drainage, poor dentition Eyes: PER, EOMI, sclera nonicteric.  Neck: Supple, no masses.  No bruit or JVD.  Pulmonary:  Good air movement, clear to auscultation bilaterally, no use of accessory muscles.  Cardiac: RRR, normal S1, S2, no Murmurs. Vascular:  left carotid bruit Vessel Right Left  Radial Palpable Palpable  PT Not Palpable Not Palpable  DP Not Palpable Not Palpable  Gastrointestinal: soft, non-distended. No guarding/no peritoneal signs.  Musculoskeletal: M/S 5/5 throughout.  No deformity or atrophy.  Neurologic: CN 2-12 intact. Pain and light touch intact in extremities.  Symmetrical.  Speech is fluent. Motor exam as listed above. Psychiatric: Judgment intact, Mood & affect appropriate for pt's clinical situation. Dermatologic: No rashes or ulcers noted.  No changes consistent with cellulitis. Lymph : No Cervical lymphadenopathy, no lichenification or skin changes of chronic lymphedema.  CBC Lab Results  Component Value Date   WBC 5.8 03/18/2020   HGB 13.4 03/18/2020   HCT 40.0 03/18/2020   MCV 103 (H) 03/18/2020   PLT 153 03/18/2020    BMET    Component Value Date/Time   NA 142 03/18/2020 1059   K 4.7 03/18/2020 1059   CL 103 03/18/2020 1059   CO2 22 03/18/2020 1059   GLUCOSE 97 03/18/2020 1059   GLUCOSE 106 (H) 05/19/2010 0636   BUN 23 03/18/2020 1059   CREATININE 1.24 (H) 03/18/2020 1059   CALCIUM 9.3 03/18/2020 1059   GFRNONAA 41 (L) 03/18/2020  1059   GFRAA 47 (L) 03/18/2020 1059   CrCl cannot be calculated (Patient's most recent lab result is older than the maximum 21 days allowed.).  COAG No results found for: INR, PROTIME  Radiology No results found.    Assessment/Plan 1. Atherosclerosis of both carotid arteries Recommend:  Given the patient's asymptomatic subcritical stenosis no further invasive testing or surgery at this time.  Duplex ultrasound shows ZOXW<96% and LICA 04-54% stenosis.  Continue antiplatelet therapy as prescribed Continue management of CAD, HTN and Hyperlipidemia Healthy heart diet,  encouraged exercise at least 4 times per week Follow up in 6 months with duplex ultrasound and physical exam   - VAS US CAROTID; Future  2. PAD (  peripheral artery disease) (H[facility])  Recommend:  The patient has evidence of atherosclerosis of the lower extremities with claudication.  The patient does not voice lifestyle limiting changes at this point in time.  Noninvasive studies do not suggest clinically significant change.  No invasive studies, angiography or surgery at this time The patient should continue walking and begin a more formal exercise program.  The patient should continue antiplatelet therapy and aggressive treatment of the lipid abnormalities  No changes in the patient's medications at this time  The patient should continue wearing graduated compression socks 10-15 mmHg strength to control the mild edema.    3. Atherosclerosis of native coronary artery of native heart without angina pectoris Continue cardiac and antihypertensive medications as already ordered and reviewed, no changes at this time.  Continue statin as ordered and reviewed, no changes at this time  Nitrates PRN for chest pain   4. Primary hypertension Continue antihypertensive medications as already ordered, these medications have been reviewed and there are no changes at this time.   5. Mixed simple and mucopurulent chronic  bronchitis ([place]) Continue statin as ordered and reviewed, no changes at this time     Hortencia Pilar, MD  07/18/2020 2:04 PM

## 2020-07-21 ENCOUNTER — Ambulatory Visit: Payer: Medicare Other | Admitting: Internal Medicine

## 2020-07-21 ENCOUNTER — Encounter: Payer: Self-pay | Admitting: Internal Medicine

## 2020-07-21 ENCOUNTER — Other Ambulatory Visit: Payer: Self-pay

## 2020-07-21 ENCOUNTER — Encounter (INDEPENDENT_AMBULATORY_CARE_PROVIDER_SITE_OTHER): Payer: Self-pay | Admitting: Vascular Surgery

## 2020-07-21 VITALS — BP 140/80 | HR 65 | Temp 97.8°F | Resp 16 | Ht 65.0 in | Wt 168.6 lb

## 2020-07-21 DIAGNOSIS — K219 Gastro-esophageal reflux disease without esophagitis: Secondary | ICD-10-CM

## 2020-07-21 DIAGNOSIS — G4733 Obstructive sleep apnea (adult) (pediatric): Secondary | ICD-10-CM

## 2020-07-21 DIAGNOSIS — J449 Chronic obstructive pulmonary disease, unspecified: Secondary | ICD-10-CM

## 2020-07-21 NOTE — Progress Notes (Signed)
[facility] [place], [place] [address]  Pulmonary Sleep Medicine   Office Visit Note  Patient Name: Christina [name] DOB: Nov 16, 1935 MRN [id number]  Date of Service: 07/21/2020  Complaints/HPI: OSA COPD she has been having issues with her CPAP device states that it is not functioning very well.  She does need to have a new machine her device is over 83 years old and so therefore she will qualify for a new device.  As far as her COPD is concerned she is now on Symbicort the Symbicort does appear to help her as far as her breathing is concerned.  I did review all of her pulmonary functions that she has had done and she has been relatively stable over the course of the last few years  ROS  General: (-) fever, (-) chills, (-) night sweats, (-) weakness Skin: (-) rashes, (-) itching,. Eyes: (-) visual changes, (-) redness, (-) itching. Nose and Sinuses: (-) nasal stuffiness or itchiness, (-) postnasal drip, (-) nosebleeds, (-) sinus trouble. Mouth and Throat: (-) sore throat, (-) hoarseness. Neck: (-) swollen glands, (-) enlarged thyroid, (-) neck pain. Respiratory: - cough, (-) bloody sputum, + shortness of breath, -100% wheezing. Cardiovascular: - ankle swelling, (-) chest pain. Lymphatic: (-) lymph node enlargement. Neurologic: (-) numbness, (-) tingling. Psychiatric: (-) anxiety, (-) depression   Current Medication: Outpatient Encounter Medications as of 07/21/2020  Medication Sig   acetaminophen (TYLENOL) 500 MG tablet Take 500 mg by mouth every 8 (eight) hours as needed for mild pain or moderate pain.    albuterol (VENTOLIN HFA) 108 (90 Base) MCG/ACT inhaler Inhale 2 puffs into the lungs every 6 (six) hours as needed for wheezing or shortness of breath.   Ascorbic Acid (VITAMIN C) 1000 MG tablet Take 1,000 mg by mouth daily.   aspirin 81 MG tablet Take 81 mg by mouth daily.    azelastine (ASTELIN) 0.1 % nasal spray Place into the nose.    budesonide-formoterol (SYMBICORT) 160-4.5 MCG/ACT inhaler INHALE 1 PUFF BY MOUTH TWICE A DAY   Calcium Carb-Cholecalciferol (CALCIUM 600+D3 PO) Take 1 tablet by mouth daily.   hydrochlorothiazide (HYDRODIURIL) 12.5 MG tablet Take by mouth.   metoprolol tartrate (LOPRESSOR) 25 MG tablet Take 1 tablet (25 mg total) by mouth 2 (two) times daily.   Multiple Vitamin (MULTI-VITAMIN DAILY PO) Take by mouth.   Multiple Vitamins-Minerals (VITAMIN D3 COMPLETE PO) Take by mouth.   pantoprazole (PROTONIX) 40 MG tablet TAKE 1 TABLET (40 MG TOTAL) BY MOUTH ONCE DAILY TAKE 30 MINUTES BEFORE BREAKFAST.   pantoprazole (PROTONIX) 40 MG tablet Take by mouth.   simvastatin (ZOCOR) 40 MG tablet TAKE 1 TABLET BY MOUTH EVERYDAY AT BEDTIME   tobramycin (TOBREX) 0.3 % ophthalmic solution INSTILL 1 DROP IN LEFT EYE 4 TIMES/DAY BEGIN 1 DAY BEFORE TREATMENT DAY OF TREATMENT AND 1 DAY AFTER   traMADol (ULTRAM) 50 MG tablet Take one tab at night for arthritis pain   Turmeric (QC TUMERIC COMPLEX PO) Take by mouth.   [DISCONTINUED] Fluticasone-Umeclidin-Vilant (TRELEGY ELLIPTA) 100-62.5-25 MCG/INH AEPB TAKE 1 PUFF BY MOUTH EVERY DAY   No facility-administered encounter medications on file as of 07/21/2020.    Surgical History: Past Surgical History:  Procedure Laterality Date   ABDOMINAL HYSTERECTOMY  1980   due to dysfunctional uterine bleeding   APPENDECTOMY  1980   BREAST SURGERY Left 2000   biopsy   CARDIAC CATHETERIZATION     COLONOSCOPY     CORONARY ARTERY BYPASS GRAFT  2006   HEMORRHOID SURGERY     LOWER EXTREMITY ANGIOGRAPHY Right 02/25/2017   Procedure: LOWER EXTREMITY ANGIOGRAPHY;  Surgeon: Algernon Huxley, MD;  Location: [facility];  Service: Cardiovascular;  Laterality: Right;   LOWER EXTREMITY INTERVENTION  02/25/2017   Procedure: LOWER EXTREMITY INTERVENTION;  Surgeon: Algernon Huxley, MD;  Location: [facility];  Service: Cardiovascular;;   LUNG BIOPSY  1999   Negative   TONSILLECTOMY       Medical History: Past Medical History:  Diagnosis Date   Arthritis    COPD (chronic obstructive pulmonary disease) ([place])    Coronary artery disease    GERD (gastroesophageal reflux disease)    Hyperlipidemia    Hypertension    Macular degeneration    PAD (peripheral artery disease) ([place])    Sleep apnea     Family History: Family History  Problem Relation Age of Onset   Hypertension Mother    Hyperlipidemia Mother    Alzheimer's disease Mother    CAD Mother    Heart attack Father    Lung disease Sister    Heart disease Sister    Diabetes Paternal Grandfather        Type 2   Hearing loss Son     Social History: Social History   Socioeconomic History   Marital status: Married    Spouse name: Cliffton SOPHIAMARIE NEASE   Number of children: 2   Years of education: 12   Highest education level: 12th grade  Occupational History   Occupation: retired  Tobacco Use   Smoking status: Former    Packs/day: 0.50    Years: 5.00    Pack years: 2.50    Types: Cigarettes    Quit date: 04/24/2004    Years since quitting: 16.2   Smokeless tobacco: Never  Vaping Use   Vaping Use: Never used  Substance and Sexual Activity   Alcohol use: No   Drug use: No   Sexual activity: Yes  Other Topics Concern   Not on file  Social History Narrative   Not on file   Social Determinants of Health   Financial Resource Strain: Low Risk    Difficulty of Paying Living Expenses: Not hard at all  Food Insecurity: No Food Insecurity   Worried About Charity fundraiser in the Last Year: Never true   [place] in the Last Year: Never true  Transportation Needs: No Transportation Needs   Lack of Transportation (Medical): No   Lack of Transportation (Non-Medical): No  Physical Activity: Inactive   Days of Exercise per Week: 0 days   Minutes of Exercise per Session: 0 min  Stress: No Stress Concern Present   Feeling of Stress : Not at all  Social Connections: Moderately Integrated    Frequency of Communication with Friends and Family: Three times a week   Frequency of Social Gatherings with Friends and Family: More than three times a week   Attends Religious Services: More than 4 times per year   Active Member of [employer] or Organizations: No   Attends Archivist Meetings: Never   Marital Status: Married  Human resources officer Violence: Not At Risk   Fear of Current or Ex-Partner: No   Emotionally Abused: No   Physically Abused: No   Sexually Abused: No    Vital Signs: Blood pressure 140/80, pulse 65, temperature 97.8 F (36.6 C), resp. rate 16, height 5\' 5"  (1.651 m), weight 168 lb 9.6 oz (  76.5 kg), SpO2 97 %.  Examination: General Appearance: The patient is well-developed, well-nourished, and in no distress. Skin: Gross inspection of skin unremarkable. Head: normocephalic, no gross deformities. Eyes: no gross deformities noted. ENT: ears appear grossly normal no exudates. Neck: Supple. No thyromegaly. No LAD. Respiratory: No rhonchi no rales clear breath sounds. Cardiovascular: Normal S1 and S2 without murmur or rub. Extremities: No cyanosis. pulses are equal. Neurologic: Alert and oriented. No involuntary movements.  LABS: No results found for this or any previous visit (from the past 2160 hour(s)).  Radiology: DG Cervical Spine Complete  Result Date: 03/18/2020 CLINICAL DATA:  Right neck pain EXAM: CERVICAL SPINE - COMPLETE 4+ VIEW COMPARISON:  None. FINDINGS: No prevertebral soft tissue swelling. Trace anterolisthesis at C3-C4 and C4-C5 and retrolisthesis at C5-C6. Multilevel degenerative endplate irregularity. Multilevel disc space narrowing, greatest at C5-C6 and C6-C7. Endplate osteophytes are present. Facet and uncovertebral hypertrophy contribute to osseous encroachment on the neural foramina also greatest at C5-C6 and C6-C7. Carotid calcifications. IMPRESSION: Advanced cervical spine spondylosis, greatest at C5-C6 and C6-C7. Electronically Signed    By: Macy Mis M.D.   On: 03/18/2020 16:22    VAS US CAROTID  Result Date: 07/18/2020 Carotid Arterial Duplex Study Patient Name:  SADHANA FRATER  Date of Exam:   07/18/2020 Medical Rec #: [id number]            Accession #:    [id number] Date of Birth: Jul 25, 1935            Patient Gender: F Patient Age:   84Y Exam Location:  [facility] Procedure:      VAS US CAROTID Referring Phys: [id number] [place] --------------------------------------------------------------------------------  Indications:       Carotid artery disease. Comparison Study:  06/20/2020; In comparison to prior study, the Left ICA                    suggest a 60-79% stenosis on the Low End of the Scale. Performing Technologist: Almira Coaster RVS  Examination Guidelines: A complete evaluation includes B-mode imaging, spectral Doppler, color Doppler, and power Doppler as needed of all accessible portions of each vessel. Bilateral testing is considered an integral part of a complete examination. Limited examinations for reoccurring indications may be performed as noted.  Right Carotid Findings: +----------+--------+--------+--------+------------------+--------+           PSV cm/sEDV cm/sStenosisPlaque DescriptionComments +----------+--------+--------+--------+------------------+--------+ CCA Prox  101     14                                         +----------+--------+--------+--------+------------------+--------+ CCA Mid   94      15                                         +----------+--------+--------+--------+------------------+--------+ CCA Distal85      15                                         +----------+--------+--------+--------+------------------+--------+ ICA Prox  107     16                                         +----------+--------+--------+--------+------------------+--------+  ICA Mid   95      20                                          +----------+--------+--------+--------+------------------+--------+ ICA Distal86      23                                         +----------+--------+--------+--------+------------------+--------+ ECA       113     0                                          +----------+--------+--------+--------+------------------+--------+ +----------+--------+-------+--------+-------------------+           PSV cm/sEDV cmsDescribeArm Pressure (mmHG) +----------+--------+-------+--------+-------------------+ Subclavian161     0                                  +----------+--------+-------+--------+-------------------+ +---------+--------+--+--------+--+ VertebralPSV cm/s85EDV cm/s11 +---------+--------+--+--------+--+  Left Carotid Findings: +----------+--------+--------+--------+------------------+--------+           PSV cm/sEDV cm/sStenosisPlaque DescriptionComments +----------+--------+--------+--------+------------------+--------+ CCA Prox  95      10                                         +----------+--------+--------+--------+------------------+--------+ CCA Mid   104     14                                         +----------+--------+--------+--------+------------------+--------+ CCA Distal86      15                                         +----------+--------+--------+--------+------------------+--------+ ICA Prox  270     43                                         +----------+--------+--------+--------+------------------+--------+ ICA Mid   184     26                                         +----------+--------+--------+--------+------------------+--------+ ICA Distal81      22                                         +----------+--------+--------+--------+------------------+--------+ ECA       89      8                                          +----------+--------+--------+--------+------------------+--------+  +----------+--------+--------+--------+-------------------+  PSV cm/sEDV cm/sDescribeArm Pressure (mmHG) +----------+--------+--------+--------+-------------------+ [id number]     0                                   +----------+--------+--------+--------+-------------------+ +---------+--------+--+--------+--+ VertebralPSV cm/s58EDV cm/s11 +---------+--------+--+--------+--+   Summary: Right Carotid: Velocities in the right ICA are consistent with a 1-39% stenosis. Left Carotid: Velocities suggest 60-79% Stenosis on the Low End of the Scale in               the Left ICA. Tortuous ECA seen in the Left ECA. Vertebrals:  Bilateral vertebral arteries demonstrate antegrade flow. Subclavians: Normal flow hemodynamics were seen in bilateral subclavian              arteries. *See table(s) above for measurements and observations.  Electronically signed by Hortencia Pilar MD on 07/18/2020 at 4:32:44 PM.    Final     VAS US CAROTID  Result Date: 07/18/2020 Carotid Arterial Duplex Study Patient Name:  ANGELISE PETRICH  Date of Exam:   07/18/2020 Medical Rec #: [id number]            Accession #:    [id number] Date of Birth: 05-13-1935            Patient Gender: F Patient Age:   84Y Exam Location:  [facility] Procedure:      VAS US CAROTID Referring Phys: [id number] [place] --------------------------------------------------------------------------------  Indications:       Carotid artery disease. Comparison Study:  06/20/2020; In comparison to prior study, the Left ICA                    suggest a 60-79% stenosis on the Low End of the Scale. Performing Technologist: Almira Coaster RVS  Examination Guidelines: A complete evaluation includes B-mode imaging, spectral Doppler, color Doppler, and power Doppler as needed of all accessible portions of each vessel. Bilateral testing is considered an integral part of a complete examination. Limited examinations for reoccurring  indications may be performed as noted.  Right Carotid Findings: +----------+--------+--------+--------+------------------+--------+           PSV cm/sEDV cm/sStenosisPlaque DescriptionComments +----------+--------+--------+--------+------------------+--------+ CCA Prox  101     14                                         +----------+--------+--------+--------+------------------+--------+ CCA Mid   94      15                                         +----------+--------+--------+--------+------------------+--------+ CCA Distal85      15                                         +----------+--------+--------+--------+------------------+--------+ ICA Prox  107     16                                         +----------+--------+--------+--------+------------------+--------+ ICA Mid   95      20                                         +----------+--------+--------+--------+------------------+--------+  ICA Distal86      23                                         +----------+--------+--------+--------+------------------+--------+ ECA       113     0                                          +----------+--------+--------+--------+------------------+--------+ +----------+--------+-------+--------+-------------------+           PSV cm/sEDV cmsDescribeArm Pressure (mmHG) +----------+--------+-------+--------+-------------------+ Subclavian161     0                                  +----------+--------+-------+--------+-------------------+ +---------+--------+--+--------+--+ VertebralPSV cm/s85EDV cm/s11 +---------+--------+--+--------+--+  Left Carotid Findings: +----------+--------+--------+--------+------------------+--------+           PSV cm/sEDV cm/sStenosisPlaque DescriptionComments +----------+--------+--------+--------+------------------+--------+ CCA Prox  95      10                                          +----------+--------+--------+--------+------------------+--------+ CCA Mid   104     14                                         +----------+--------+--------+--------+------------------+--------+ CCA Distal86      15                                         +----------+--------+--------+--------+------------------+--------+ ICA Prox  270     43                                         +----------+--------+--------+--------+------------------+--------+ ICA Mid   184     26                                         +----------+--------+--------+--------+------------------+--------+ ICA Distal81      22                                         +----------+--------+--------+--------+------------------+--------+ ECA       89      8                                          +----------+--------+--------+--------+------------------+--------+ +----------+--------+--------+--------+-------------------+           PSV cm/sEDV cm/sDescribeArm Pressure (mmHG) +----------+--------+--------+--------+-------------------+ [id number]     0                                   +----------+--------+--------+--------+-------------------+ +---------+--------+--+--------+--+  VertebralPSV cm/s58EDV cm/s11 +---------+--------+--+--------+--+   Summary: Right Carotid: Velocities in the right ICA are consistent with a 1-39% stenosis. Left Carotid: Velocities suggest 60-79% Stenosis on the Low End of the Scale in               the Left ICA. Tortuous ECA seen in the Left ECA. Vertebrals:  Bilateral vertebral arteries demonstrate antegrade flow. Subclavians: Normal flow hemodynamics were seen in bilateral subclavian              arteries. *See table(s) above for measurements and observations.  Electronically signed by Hortencia Pilar MD on 07/18/2020 at 4:32:44 PM.    Final       Assessment and Plan: Patient Active Problem List   Diagnosis Date Noted   Atherosclerosis of both carotid  arteries 03/18/2020   Memory loss 03/18/2020   Hand arthritis 03/18/2020   Neck pain 03/18/2020   Aortic atherosclerosis ([place]) 10/26/2016   Claudication ([place]) 10/26/2016   COPD (chronic obstructive pulmonary disease) ([place]) 10/22/2016   OSA on CPAP 09/16/2015   Abnormal chest x-ray 01/25/2015   Atrophic vaginitis 01/25/2015   Atherosclerosis of coronary artery 01/25/2015   Chronic kidney disease (CKD), stage III (moderate) ([place]) 01/25/2015   Bloodgood disease 01/25/2015   Blood glucose elevated 01/25/2015   Hyperlipidemia 01/25/2015   Cannot sleep 01/25/2015   Hypertension 11/02/2014   Acid reflux 11/02/2014   Arthritis 10/19/2014   Adenomatous colon polyp 07/27/2013   Gastric catarrh 07/27/2013   Bergmann's syndrome 07/27/2013   Barrett esophagus 07/07/2013   Osteopenia 12/13/2009   PAD (peripheral artery disease) ([place]) 09/24/2007   DD (diverticular disease) 12/19/2005    1. Obstructive sleep apnea New CPAP machine has been ordered for her we will have her DME get this arranged for her.  She does have good control of her obstructive sleep apnea with the device - For home use only DME continuous positive airway pressure (CPAP)  2. Chronic obstructive pulmonary disease, unspecified COPD type ([place]) Under control continue with Symbicort and rescue inhalers  3. Gastroesophageal reflux disease without esophagitis PPI as needed   General Counseling: I have discussed the findings of the evaluation and examination with Mardene Celeste.  I have also discussed any further diagnostic evaluation thatmay be needed or ordered today. Andreanna verbalizes understanding of the findings of todays visit. We also reviewed her medications today and discussed drug interactions and side effects including but not limited excessive drowsiness and altered mental states. We also discussed that there is always a risk not just to her but also people around her. she has been encouraged to call the office with any  questions or concerns that should arise related to todays visit.  No orders of the defined types were placed in this encounter.    Time spent: 35-minute  I have personally obtained a history, examined the patient, evaluated laboratory and imaging results, formulated the assessment and plan and placed orders.    Allyne Gee, MD [facility] Sleep medicine

## 2020-08-15 ENCOUNTER — Encounter (INDEPENDENT_AMBULATORY_CARE_PROVIDER_SITE_OTHER): Payer: Self-pay

## 2020-08-17 ENCOUNTER — Telehealth: Payer: Self-pay

## 2020-08-17 NOTE — Telephone Encounter (Signed)
Copied from [place] [phone]. Topic: General - Other >> Aug 17, 2020  9:00 AM Alanda Slim E wrote: Reason for CRM: Pt called to check with Nurse about if she should get the pneumonia shot or if she is due for it/ please advise

## 2020-08-17 NOTE — Telephone Encounter (Signed)
I advised pt she already had both pneumonia vaccines.   Thanks,   -Mickel Baas

## 2020-08-19 DIAGNOSIS — H353213 Exudative age-related macular degeneration, right eye, with inactive scar: Secondary | ICD-10-CM | POA: Diagnosis not present

## 2020-08-19 DIAGNOSIS — H35432 Paving stone degeneration of retina, left eye: Secondary | ICD-10-CM | POA: Diagnosis not present

## 2020-08-19 DIAGNOSIS — H43813 Vitreous degeneration, bilateral: Secondary | ICD-10-CM | POA: Diagnosis not present

## 2020-08-19 DIAGNOSIS — H353221 Exudative age-related macular degeneration, left eye, with active choroidal neovascularization: Secondary | ICD-10-CM | POA: Diagnosis not present

## 2020-08-24 DIAGNOSIS — G4733 Obstructive sleep apnea (adult) (pediatric): Secondary | ICD-10-CM | POA: Diagnosis not present

## 2020-09-09 ENCOUNTER — Telehealth: Payer: Self-pay

## 2020-09-09 DIAGNOSIS — K219 Gastro-esophageal reflux disease without esophagitis: Secondary | ICD-10-CM

## 2020-09-09 MED ORDER — PANTOPRAZOLE SODIUM 40 MG PO TBEC: DELAYED_RELEASE_TABLET | ORAL | 1 refills | Status: DC

## 2020-09-09 NOTE — Telephone Encounter (Signed)
[facility] faxed refill request for the following medications:  pantoprazole (PROTONIX) 40 MG tablet   Please advise.  

## 2020-09-09 NOTE — Telephone Encounter (Signed)
Rx sent to pharmacy   

## 2020-09-13 ENCOUNTER — Other Ambulatory Visit: Payer: Self-pay | Admitting: Internal Medicine

## 2020-09-13 DIAGNOSIS — J449 Chronic obstructive pulmonary disease, unspecified: Secondary | ICD-10-CM

## 2020-09-15 ENCOUNTER — Telehealth: Payer: Self-pay

## 2020-09-15 NOTE — Telephone Encounter (Signed)
Verbal orders signed by provider from [place] patient for use of home medical equipment and placed in [place] Patient folder.

## 2020-09-24 DIAGNOSIS — G4733 Obstructive sleep apnea (adult) (pediatric): Secondary | ICD-10-CM | POA: Diagnosis not present

## 2020-10-01 ENCOUNTER — Other Ambulatory Visit: Payer: Self-pay | Admitting: Internal Medicine

## 2020-10-07 DIAGNOSIS — H43813 Vitreous degeneration, bilateral: Secondary | ICD-10-CM | POA: Diagnosis not present

## 2020-10-07 DIAGNOSIS — H353213 Exudative age-related macular degeneration, right eye, with inactive scar: Secondary | ICD-10-CM | POA: Diagnosis not present

## 2020-10-07 DIAGNOSIS — H35432 Paving stone degeneration of retina, left eye: Secondary | ICD-10-CM | POA: Diagnosis not present

## 2020-10-07 DIAGNOSIS — H353221 Exudative age-related macular degeneration, left eye, with active choroidal neovascularization: Secondary | ICD-10-CM | POA: Diagnosis not present

## 2020-10-17 DIAGNOSIS — I25118 Atherosclerotic heart disease of native coronary artery with other forms of angina pectoris: Secondary | ICD-10-CM | POA: Diagnosis not present

## 2020-10-17 DIAGNOSIS — I739 Peripheral vascular disease, unspecified: Secondary | ICD-10-CM | POA: Diagnosis not present

## 2020-10-17 DIAGNOSIS — G4733 Obstructive sleep apnea (adult) (pediatric): Secondary | ICD-10-CM | POA: Diagnosis not present

## 2020-10-17 DIAGNOSIS — Z951 Presence of aortocoronary bypass graft: Secondary | ICD-10-CM | POA: Diagnosis not present

## 2020-10-17 DIAGNOSIS — I6523 Occlusion and stenosis of bilateral carotid arteries: Secondary | ICD-10-CM | POA: Diagnosis not present

## 2020-10-17 DIAGNOSIS — I071 Rheumatic tricuspid insufficiency: Secondary | ICD-10-CM | POA: Diagnosis not present

## 2020-10-17 DIAGNOSIS — R609 Edema, unspecified: Secondary | ICD-10-CM | POA: Diagnosis not present

## 2020-10-17 DIAGNOSIS — R42 Dizziness and giddiness: Secondary | ICD-10-CM | POA: Diagnosis not present

## 2020-10-17 DIAGNOSIS — R0609 Other forms of dyspnea: Secondary | ICD-10-CM | POA: Diagnosis not present

## 2020-10-17 DIAGNOSIS — E782 Mixed hyperlipidemia: Secondary | ICD-10-CM | POA: Diagnosis not present

## 2020-10-17 DIAGNOSIS — I10 Essential (primary) hypertension: Secondary | ICD-10-CM | POA: Diagnosis not present

## 2020-10-17 DIAGNOSIS — J449 Chronic obstructive pulmonary disease, unspecified: Secondary | ICD-10-CM | POA: Diagnosis not present

## 2020-10-24 ENCOUNTER — Other Ambulatory Visit: Payer: Self-pay | Admitting: Physician Assistant

## 2020-10-24 DIAGNOSIS — E7800 Pure hypercholesterolemia, unspecified: Secondary | ICD-10-CM

## 2020-10-24 DIAGNOSIS — I10 Essential (primary) hypertension: Secondary | ICD-10-CM

## 2020-10-25 DIAGNOSIS — G4733 Obstructive sleep apnea (adult) (pediatric): Secondary | ICD-10-CM | POA: Diagnosis not present

## 2020-10-26 ENCOUNTER — Telehealth: Payer: Self-pay

## 2020-10-26 NOTE — Telephone Encounter (Signed)
Copied from [place] [id number]. Topic: Appointment Scheduling - Scheduling Inquiry for Clinic >> Oct 26, 2020 11:45 AM Yvette Rack wrote: Reason for CRM: Pt would like to schedule an appt for medication refill with [name]. Cb# [phone]

## 2020-11-03 ENCOUNTER — Other Ambulatory Visit: Payer: Self-pay

## 2020-11-03 ENCOUNTER — Telehealth: Payer: Self-pay

## 2020-11-03 ENCOUNTER — Encounter: Payer: Self-pay | Admitting: Family Medicine

## 2020-11-03 ENCOUNTER — Ambulatory Visit (INDEPENDENT_AMBULATORY_CARE_PROVIDER_SITE_OTHER): Payer: Medicare Other | Admitting: Family Medicine

## 2020-11-03 VITALS — BP 174/70 | HR 65 | Temp 97.8°F | Resp 16 | Wt 168.6 lb

## 2020-11-03 DIAGNOSIS — K219 Gastro-esophageal reflux disease without esophagitis: Secondary | ICD-10-CM | POA: Insufficient documentation

## 2020-11-03 DIAGNOSIS — I739 Peripheral vascular disease, unspecified: Secondary | ICD-10-CM | POA: Diagnosis not present

## 2020-11-03 DIAGNOSIS — I10 Essential (primary) hypertension: Secondary | ICD-10-CM | POA: Diagnosis not present

## 2020-11-03 DIAGNOSIS — K449 Diaphragmatic hernia without obstruction or gangrene: Secondary | ICD-10-CM

## 2020-11-03 DIAGNOSIS — E7800 Pure hypercholesterolemia, unspecified: Secondary | ICD-10-CM | POA: Diagnosis not present

## 2020-11-03 DIAGNOSIS — I700 Atherosclerosis of aorta: Secondary | ICD-10-CM | POA: Diagnosis not present

## 2020-11-03 DIAGNOSIS — Z9989 Dependence on other enabling machines and devices: Secondary | ICD-10-CM | POA: Diagnosis not present

## 2020-11-03 DIAGNOSIS — G4733 Obstructive sleep apnea (adult) (pediatric): Secondary | ICD-10-CM

## 2020-11-03 DIAGNOSIS — Z23 Encounter for immunization: Secondary | ICD-10-CM | POA: Insufficient documentation

## 2020-11-03 DIAGNOSIS — K2270 Barrett's esophagus without dysplasia: Secondary | ICD-10-CM | POA: Diagnosis not present

## 2020-11-03 MED ORDER — COQ-10 100 MG PO CPCR: 100.0000 mg | ORAL_CAPSULE | Freq: Every day | ORAL | 1 refills | Status: DC

## 2020-11-03 MED ORDER — ROSUVASTATIN CALCIUM 20 MG PO TABS
20.0000 mg | ORAL_TABLET | Freq: Every day | ORAL | 3 refills | Status: DC
Start: 2020-11-03 — End: 2021-07-11

## 2020-11-03 MED ORDER — AMLODIPINE BESYLATE 5 MG PO TABS: 5.0000 mg | ORAL_TABLET | Freq: Every day | ORAL | 1 refills | Status: DC

## 2020-11-03 MED ORDER — PANTOPRAZOLE SODIUM 40 MG PO TBEC: DELAYED_RELEASE_TABLET | ORAL | 3 refills | Status: DC

## 2020-11-03 MED ORDER — METOPROLOL TARTRATE 25 MG PO TABS: 25.0000 mg | ORAL_TABLET | Freq: Two times a day (BID) | ORAL | 3 refills | Status: DC

## 2020-11-03 NOTE — Assessment & Plan Note (Signed)
Elevated reading Hold off on change of BB to ER Add CaChBl On diuretic RTC in 3 wks for nurse visit for BP check

## 2020-11-03 NOTE — Assessment & Plan Note (Signed)
On PPI tx

## 2020-11-03 NOTE — Assessment & Plan Note (Signed)
Change to higher strength statin given known RFs

## 2020-11-03 NOTE — Progress Notes (Signed)
Established patient visit   Patient: Christina [name]   DOB: 11-23-1935   85 y.o. Female  MRN: [id number] Visit Date: 11/03/2020  Today's healthcare provider: [name], FNP   Chief Complaint  Patient presents with   Hypertension   Hyperlipidemia   Arthritis    Patient reports that she has arthritis and states that pain at times is severe, she states that she was advised by her pulmonologist not to take Tylenol because it can cause liver damage. Patient states Dr. Oren Binet had prescribed her Tramadol which she has been on in the past but states that she never filled prescription because she does not feel medication is effective for pain relief.    Subjective    HPI HPI     Arthritis    Additional comments: Patient reports that she has arthritis and states that pain at times is severe, she states that she was advised by her pulmonologist not to take Tylenol because it can cause liver damage. Patient states Dr. Oren Binet had prescribed her Tramadol which she has been on in the past but states that she never filled prescription because she does not feel medication is effective for pain relief.       Last edited by Minette Headland, CMA on 11/03/2020  2:16 PM.      Hypertension, follow-up  BP Readings from Last 3 Encounters:  11/03/20 (!) 174/70  07/21/20 140/80  07/18/20 (!) 183/67   Wt Readings from Last 3 Encounters:  11/03/20 168 lb 9.6 oz (76.5 kg)  07/21/20 168 lb 9.6 oz (76.5 kg)  07/18/20 167 lb (75.8 kg)     She was last seen for hypertension 7 months ago.  BP at that visit was 166/69. Management since that visit includes none.  She reports excellent compliance with treatment. She is not having side effects.  She is following a Regular diet. She is not exercising. She does not smoke.  Use of agents associated with hypertension: none.   Outside blood pressures are on average 150/70. Symptoms: No chest pain No chest pressure  No palpitations No  syncope  Yes dyspnea No orthopnea  No paroxysmal nocturnal dyspnea No lower extremity edema   Pertinent labs: Lab Results  Component Value Date   CHOL 159 03/18/2020   HDL 59 03/18/2020   LDLCALC 75 03/18/2020   TRIG 143 03/18/2020   CHOLHDL 3.0 02/20/2019   Lab Results  Component Value Date   NA 142 03/18/2020   K 4.7 03/18/2020   CREATININE 1.24 (H) 03/18/2020   GFRNONAA 41 (L) 03/18/2020   GLUCOSE 97 03/18/2020     The ASCVD Risk score (Arnett DK, et al., 2019) failed to calculate for the following reasons:   The 2019 ASCVD risk score is only valid for ages 42 to 37   ---------------------------------------------------------------------------------------------------  Lipid/Cholesterol, Follow-up  Last lipid panel Other pertinent labs  Lab Results  Component Value Date   CHOL 159 03/18/2020   HDL 59 03/18/2020   LDLCALC 75 03/18/2020   TRIG 143 03/18/2020   CHOLHDL 3.0 02/20/2019   Lab Results  Component Value Date   ALT 13 03/18/2020   AST 22 03/18/2020   PLT 153 03/18/2020   TSH 4.590 (H) 03/18/2020     She was last seen for this 7 months ago.  Management since that visit includes none.  She reports excellent compliance with treatment. She is not having side effects.   Symptoms: No chest pain No chest  pressure/discomfort  No dyspnea No lower extremity edema  No numbness or tingling of extremity No orthopnea  No palpitations No paroxysmal nocturnal dyspnea  No speech difficulty No syncope   Current diet: well balanced Current exercise: no regular exercise  The ASCVD Risk score (Arnett DK, et al., 2019) failed to calculate for the following reasons:   The 2019 ASCVD risk score is only valid for ages 53 to 43  ---------------------------------------------------------------------------------------------------   Medications: Outpatient Medications Prior to Visit  Medication Sig   acetaminophen (TYLENOL) 500 MG tablet Take 500 mg by mouth every 8  (eight) hours as needed for mild pain or moderate pain.    albuterol (VENTOLIN HFA) 108 (90 Base) MCG/ACT inhaler Inhale 2 puffs into the lungs every 6 (six) hours as needed for wheezing or shortness of breath.   Ascorbic Acid (VITAMIN C) 1000 MG tablet Take 1,000 mg by mouth daily.   aspirin 81 MG tablet Take 81 mg by mouth daily.    azelastine (ASTELIN) 0.1 % nasal spray Place into the nose.   budesonide-formoterol (SYMBICORT) 160-4.5 MCG/ACT inhaler TAKE 1 PUFF BY MOUTH TWICE A DAY   Calcium Carb-Cholecalciferol (CALCIUM 600+D3 PO) Take 1 tablet by mouth daily.   hydrochlorothiazide (HYDRODIURIL) 12.5 MG tablet Take by mouth.   Multiple Vitamin (MULTI-VITAMIN DAILY PO) Take by mouth.   Multiple Vitamins-Minerals (VITAMIN D3 COMPLETE PO) Take by mouth.   tobramycin (TOBREX) 0.3 % ophthalmic solution INSTILL 1 DROP IN LEFT EYE 4 TIMES/DAY BEGIN 1 DAY BEFORE TREATMENT DAY OF TREATMENT AND 1 DAY AFTER   Turmeric (QC TUMERIC COMPLEX PO) Take by mouth.   [DISCONTINUED] metoprolol tartrate (LOPRESSOR) 25 MG tablet TAKE 1 TABLET BY MOUTH TWICE A DAY   [DISCONTINUED] pantoprazole (PROTONIX) 40 MG tablet TAKE 1 TABLET (40 MG TOTAL) BY MOUTH ONCE DAILY TAKE 30 MINUTES BEFORE BREAKFAST.   [DISCONTINUED] simvastatin (ZOCOR) 40 MG tablet TAKE 1 TABLET BY MOUTH EVERYDAY AT BEDTIME   [DISCONTINUED] traMADol (ULTRAM) 50 MG tablet Take one tab at night for arthritis pain   No facility-administered medications prior to visit.    Review of Systems      Objective    BP (!) 174/70   Pulse 65   Temp 97.8 F (36.6 C) (Oral)   Resp 16   Wt 168 lb 9.6 oz (76.5 kg)   BMI 28.06 kg/m     Physical Exam Vitals and nursing note reviewed.  Constitutional:      General: She is awake. She is not in acute distress.    Appearance: Normal appearance. She is well-developed, well-groomed and overweight. She is not ill-appearing, toxic-appearing or diaphoretic.  HENT:     Head: Normocephalic and atraumatic.      Jaw: There is normal jaw occlusion. No trismus, tenderness, swelling or pain on movement.     Right Ear: Hearing, tympanic membrane, ear canal and external ear normal. There is no impacted cerumen.     Left Ear: Hearing, tympanic membrane, ear canal and external ear normal. There is no impacted cerumen.     Nose: Nose normal. No congestion or rhinorrhea.     Right Turbinates: Not enlarged, swollen or pale.     Left Turbinates: Not enlarged, swollen or pale.     Right Sinus: No maxillary sinus tenderness or frontal sinus tenderness.     Left Sinus: No maxillary sinus tenderness or frontal sinus tenderness.     Mouth/Throat:     Lips: Pink.     Mouth: Mucous membranes  are moist. No injury.     Tongue: No lesions.     Pharynx: Oropharynx is clear. Uvula midline. No pharyngeal swelling, oropharyngeal exudate, posterior oropharyngeal erythema or uvula swelling.     Tonsils: No tonsillar exudate or tonsillar abscesses.  Eyes:     General: Lids are normal. Lids are everted, no foreign bodies appreciated. Vision grossly intact. Gaze aligned appropriately. No allergic shiner or visual field deficit.       Right eye: No discharge.        Left eye: No discharge.     Extraocular Movements: Extraocular movements intact.     Conjunctiva/sclera: Conjunctivae normal.     Right eye: Right conjunctiva is not injected. No exudate.    Left eye: Left conjunctiva is not injected. No exudate.    Pupils: Pupils are equal, round, and reactive to light.  Neck:     Thyroid: No thyroid mass, thyromegaly or thyroid tenderness.     Vascular: No carotid bruit.     Trachea: Trachea normal.  Cardiovascular:     Rate and Rhythm: Normal rate and regular rhythm.     Pulses: Normal pulses.          Carotid pulses are 2+ on the right side and 2+ on the left side.      Radial pulses are 2+ on the right side and 2+ on the left side.       Dorsalis pedis pulses are 2+ on the right side and 2+ on the left side.        Posterior tibial pulses are 2+ on the right side and 2+ on the left side.     Heart sounds: Normal heart sounds, S1 normal and S2 normal. No murmur heard.   No friction rub. No gallop.  Pulmonary:     Effort: Pulmonary effort is normal. No respiratory distress.     Breath sounds: Decreased air movement present. No stridor. Examination of the right-lower field reveals decreased breath sounds. Examination of the left-lower field reveals decreased breath sounds. Decreased breath sounds present. No wheezing, rhonchi or rales.     Comments: COPD On 2 inhalers Using CPAP at night No O2 bleed through Mask wearer- fits well  Chest:     Chest wall: No tenderness.     Comments: Breast exam deferred; discussed 'know your lemons' campaign and self exam Abdominal:     General: Abdomen is flat. Bowel sounds are normal. There is no distension.     Palpations: Abdomen is soft. There is no mass.     Tenderness: There is no abdominal tenderness. There is no right CVA tenderness, left CVA tenderness, guarding or rebound.     Hernia: No hernia is present.  Genitourinary:    Comments: Exam deferred; denies complaints Musculoskeletal:        General: No swelling, tenderness, deformity or signs of injury. Normal range of motion.     Cervical back: Full passive range of motion without pain, normal range of motion and neck supple. No edema, rigidity or tenderness. No muscular tenderness.     Right lower leg: No edema.     Left lower leg: No edema.  Lymphadenopathy:     Cervical: No cervical adenopathy.     Right cervical: No superficial, deep or posterior cervical adenopathy.    Left cervical: No superficial, deep or posterior cervical adenopathy.  Skin:    General: Skin is warm and dry.     Capillary Refill: Capillary refill takes less than  2 seconds.     Coloration: Skin is not jaundiced or pale.     Findings: No bruising, erythema, lesion or rash.  Neurological:     General: No focal deficit present.      Mental Status: She is alert and oriented to person, place, and time. Mental status is at baseline.     GCS: GCS eye subscore is 4. GCS verbal subscore is 5. GCS motor subscore is 6.     Sensory: Sensation is intact. No sensory deficit.     Motor: Motor function is intact. No weakness.     Coordination: Coordination is intact. Coordination normal.     Gait: Gait is intact. Gait normal.  Psychiatric:        Attention and Perception: Attention and perception normal.        Mood and Affect: Mood and affect normal.        Speech: Speech normal.        Behavior: Behavior normal. Behavior is cooperative.        Thought Content: Thought content normal.        Cognition and Memory: Cognition and memory normal.        Judgment: Judgment normal.     No results found for any visits on 11/03/20.  Assessment & Plan     Problem List Items Addressed This Visit       Cardiovascular and Mediastinum   Hypertension - Primary    Elevated reading Hold off on change of BB to ER Add CaChBl On diuretic RTC in 3 wks for nurse visit for BP check      Relevant Medications   metoprolol tartrate (LOPRESSOR) 25 MG tablet   rosuvastatin (CRESTOR) 20 MG tablet   amLODipine (NORVASC) 5 MG tablet   Aortic atherosclerosis (HCC)    Seen on CT; due for return in December for follow up carotid US Plan for intervention if >60%      Relevant Medications   metoprolol tartrate (LOPRESSOR) 25 MG tablet   rosuvastatin (CRESTOR) 20 MG tablet   amLODipine (NORVASC) 5 MG tablet     Respiratory   OSA on CPAP    Nightly mask wear No o2 bleed Use of 2 inhalers Pt reports well fitting mask      Hiatal hernia with gastroesophageal reflux disease without esophagitis    Does not impair food consistency Slows food consumption Denies n/v Feels some 'pressure' depending on meal choice      Relevant Medications   pantoprazole (PROTONIX) 40 MG tablet     Digestive   Acid reflux    Long standing use of PPI  for Barretts plus hiatal hernia      Relevant Medications   pantoprazole (PROTONIX) 40 MG tablet   Barrett esophagus    On PPI tx        Other   Hyperlipidemia    Change to higher strength statin given known RFs      Relevant Medications   metoprolol tartrate (LOPRESSOR) 25 MG tablet   rosuvastatin (CRESTOR) 20 MG tablet   amLODipine (NORVASC) 5 MG tablet   Coenzyme Q10 (COQ-10) 100 MG capsule   Claudication (HCC)    S/p LE bypass Changes weight/leg positioning when standing for long period of time Reassurance provided      Need for influenza vaccination    High dose given today      Relevant Orders   Flu Vaccine QUAD High Dose(Fluad)     Return in about 3  weeks (around 11/24/2020) for nurse follow up, blood pressure check, chonic disease management.      Vonna Kotyk, FNP, have reviewed all documentation for this visit. The documentation on 11/03/20 for the exam, diagnosis, procedures, and orders are all accurate and complete.    [name], [place] [phone] (phone) [phone] (fax)  [place]

## 2020-11-03 NOTE — Assessment & Plan Note (Signed)
Seen on CT; due for return in December for follow up carotid US Plan for intervention if >60%

## 2020-11-03 NOTE — Assessment & Plan Note (Signed)
Nightly mask wear No o2 bleed Use of 2 inhalers Pt reports well fitting mask

## 2020-11-03 NOTE — Telephone Encounter (Signed)
Left vm to confirm 11/09/20 pft appointment-Toni

## 2020-11-03 NOTE — Assessment & Plan Note (Signed)
Long standing use of PPI for Barretts plus hiatal hernia

## 2020-11-03 NOTE — Assessment & Plan Note (Signed)
High dose given today

## 2020-11-03 NOTE — Assessment & Plan Note (Signed)
Does not impair food consistency Slows food consumption Denies n/v Feels some 'pressure' depending on meal choice

## 2020-11-03 NOTE — Assessment & Plan Note (Signed)
S/p LE bypass Changes weight/leg positioning when standing for long period of time Reassurance provided

## 2020-11-04 ENCOUNTER — Telehealth: Payer: Self-pay

## 2020-11-04 NOTE — Telephone Encounter (Signed)
Advised patient that she should taking new medication in addition to her current meds as recommended by Elise,NP on 11/03/20.

## 2020-11-04 NOTE — Telephone Encounter (Signed)
Copied from [place] [id number]. Topic: General - Other >> Nov 04, 2020  9:05 AM Tessa Lerner A wrote: Reason for CRM: The patient would like to speak with a member of staff when possible regarding their prescriptions   The patient has been prescribed two new medications and would like to make sure there is no interference   Please contact further when possible

## 2020-11-07 ENCOUNTER — Ambulatory Visit: Payer: Medicare Other | Admitting: Internal Medicine

## 2020-11-09 ENCOUNTER — Ambulatory Visit (INDEPENDENT_AMBULATORY_CARE_PROVIDER_SITE_OTHER): Payer: Medicare Other | Admitting: Internal Medicine

## 2020-11-09 ENCOUNTER — Other Ambulatory Visit: Payer: Self-pay

## 2020-11-09 DIAGNOSIS — R0602 Shortness of breath: Secondary | ICD-10-CM | POA: Diagnosis not present

## 2020-11-09 DIAGNOSIS — J449 Chronic obstructive pulmonary disease, unspecified: Secondary | ICD-10-CM

## 2020-11-09 LAB — PULMONARY FUNCTION TEST

## 2020-11-15 DIAGNOSIS — G4733 Obstructive sleep apnea (adult) (pediatric): Secondary | ICD-10-CM | POA: Diagnosis not present

## 2020-11-20 NOTE — Procedures (Signed)
[facility] [address] [place], [id number]    Complete Pulmonary Function Testing Interpretation:  FINDINGS:  Forced vital capacity is mildly decreased.  FEV1 is 1.38 L which is mildly decreased.  FEV1 FVC ratio is mildly decreased.  Postbronchodilator no significant improvement in FEV1.  Total lung capacity is mildly decreased.  Residual volume is normal residual volume total lung capacity ratio is increased.  The FRC was normal.  DLCO was moderately decreased.  IMPRESSION:  This pulmonary function study is consistent with mild obstructive lung disease and mild restrictive lung disease  Allyne Gee, MD [facility] Pulmonary Critical Care Medicine Sleep Medicine

## 2020-11-22 DIAGNOSIS — H35432 Paving stone degeneration of retina, left eye: Secondary | ICD-10-CM | POA: Diagnosis not present

## 2020-11-22 DIAGNOSIS — H43813 Vitreous degeneration, bilateral: Secondary | ICD-10-CM | POA: Diagnosis not present

## 2020-11-22 DIAGNOSIS — H353221 Exudative age-related macular degeneration, left eye, with active choroidal neovascularization: Secondary | ICD-10-CM | POA: Diagnosis not present

## 2020-11-22 DIAGNOSIS — H353213 Exudative age-related macular degeneration, right eye, with inactive scar: Secondary | ICD-10-CM | POA: Diagnosis not present

## 2020-11-24 ENCOUNTER — Other Ambulatory Visit: Payer: Self-pay

## 2020-11-24 ENCOUNTER — Telehealth: Payer: Self-pay

## 2020-11-24 ENCOUNTER — Ambulatory Visit: Payer: Medicare Other | Admitting: Family Medicine

## 2020-11-24 VITALS — BP 154/80

## 2020-11-24 DIAGNOSIS — I10 Essential (primary) hypertension: Secondary | ICD-10-CM

## 2020-11-24 DIAGNOSIS — G4733 Obstructive sleep apnea (adult) (pediatric): Secondary | ICD-10-CM | POA: Diagnosis not present

## 2020-11-24 NOTE — Telephone Encounter (Signed)
Patient came into office this morning for nurse blood pressure check and request a call back from Dr. Coralee Rud nurse because she would like Dr. Brita Romp to be her new PCP now and no longer wants to see Mrs. Rollene Rotunda. KW

## 2020-11-24 NOTE — Telephone Encounter (Signed)
I am unable to take on any new patients at this time.  Christina [name] may be an option or there are new female MDs at [facility] that are accepting new patients if she wants an MD/DO

## 2020-11-24 NOTE — Progress Notes (Signed)
Patient comes in office today for nurse visit to recheck blood pressure. Patient reports that she feels well today but would like to discuss getting off blood pressure medications because she feels that she is taking to much. After reviewing blood pressure reading with patient today she has been advised to continue medication and keep track of blood pressure readings at home till next office visit.

## 2020-11-25 NOTE — Telephone Encounter (Signed)
Patient advised. Will continue to see Christina [name]. She reports that it is mostly for a name on her insurance card.

## 2020-12-14 ENCOUNTER — Ambulatory Visit: Payer: Medicare Other

## 2020-12-21 ENCOUNTER — Other Ambulatory Visit: Payer: Self-pay

## 2020-12-21 ENCOUNTER — Ambulatory Visit (INDEPENDENT_AMBULATORY_CARE_PROVIDER_SITE_OTHER): Payer: Medicare Other

## 2020-12-21 DIAGNOSIS — G4733 Obstructive sleep apnea (adult) (pediatric): Secondary | ICD-10-CM

## 2020-12-21 NOTE — Progress Notes (Signed)
95 percentile pressure 12   95th percentile leak 30   apnea index 2.1 /hr  apnea-hypopnea index  3.2 /hr   total days used  >4 hr 30 days  total days used <4 hr 0 days  Total compliance 100 percent  She is doing very good on cpap no problems or questions at this time Pt was seen by Claiborne Billings RRT/RCP  from [facility]

## 2020-12-23 DIAGNOSIS — H353213 Exudative age-related macular degeneration, right eye, with inactive scar: Secondary | ICD-10-CM | POA: Diagnosis not present

## 2020-12-23 DIAGNOSIS — H43813 Vitreous degeneration, bilateral: Secondary | ICD-10-CM | POA: Diagnosis not present

## 2020-12-23 DIAGNOSIS — H353221 Exudative age-related macular degeneration, left eye, with active choroidal neovascularization: Secondary | ICD-10-CM | POA: Diagnosis not present

## 2020-12-23 DIAGNOSIS — H35432 Paving stone degeneration of retina, left eye: Secondary | ICD-10-CM | POA: Diagnosis not present

## 2020-12-25 DIAGNOSIS — G4733 Obstructive sleep apnea (adult) (pediatric): Secondary | ICD-10-CM | POA: Diagnosis not present

## 2021-01-16 ENCOUNTER — Encounter: Payer: Self-pay | Admitting: Nurse Practitioner

## 2021-01-16 ENCOUNTER — Ambulatory Visit: Payer: Medicare Other | Admitting: Nurse Practitioner

## 2021-01-16 ENCOUNTER — Other Ambulatory Visit: Payer: Self-pay

## 2021-01-16 VITALS — BP 140/70 | HR 78 | Temp 98.4°F | Resp 16 | Ht 66.0 in | Wt 165.6 lb

## 2021-01-16 DIAGNOSIS — G4733 Obstructive sleep apnea (adult) (pediatric): Secondary | ICD-10-CM

## 2021-01-16 DIAGNOSIS — J449 Chronic obstructive pulmonary disease, unspecified: Secondary | ICD-10-CM

## 2021-01-16 DIAGNOSIS — Z9989 Dependence on other enabling machines and devices: Secondary | ICD-10-CM | POA: Diagnosis not present

## 2021-01-16 NOTE — Progress Notes (Signed)
[facility] [place], [place] [address]  Internal MEDICINE  Office Visit Note  Patient Name: Christina [name]  [id number]  [id number]  Date of Service: 01/16/2021  Chief Complaint  Patient presents with   Follow-up    Doesn't sleep well even with cpap, up and down all night long going to the bathroom    Results   Sleep Apnea   COPD    HPI Christina [name] presents for a follow-up visit for COPD and sleep apnea.  Her most recent pulmonary function test showed mild obstructive lung disease and mild restrictive lung disease which is consistent with the findings of her previous pulmonary function test from October 2021.  Her CPAP download showed a pressure of 12 the majority of the time and AHI of 3.2/h and her compliance rate was 100%.  Patient has been trying to keep her CPAP on 7 to 8 hours per night she does have an issue with getting up a lot due to needing to use the bathroom and then that keeps her up.  She has tried taking melatonin which has not helped and she is reluctant to try any prescription sleep medications.    CPAP download: 95 percentile pressure: 12 95th percentile leak 30 apnea index 2.1 /hr apnea-hypopnea index  3.2 /hr total days used  >4 hr 30 days total days used <4 hr 0 days Total compliance: 100 percent  She is doing very good on cpap no problems or questions at this time Pt was seen by Claiborne Billings RRT/RCP  from [facility] Registering 7-8 hours on cpap.    Current Medication: Outpatient Encounter Medications as of 01/16/2021  Medication Sig   acetaminophen (TYLENOL) 500 MG tablet Take 500 mg by mouth every 8 (eight) hours as needed for mild pain or moderate pain.    albuterol (VENTOLIN HFA) 108 (90 Base) MCG/ACT inhaler Inhale 2 puffs into the lungs every 6 (six) hours as needed for wheezing or shortness of breath.   amLODipine (NORVASC) 5 MG tablet Take 1 tablet (5 mg total) by mouth daily.   Ascorbic Acid (VITAMIN C) 1000 MG tablet Take 1,000 mg  by mouth daily.   aspirin 81 MG tablet Take 81 mg by mouth daily.    azelastine (ASTELIN) 0.1 % nasal spray Place into the nose.   budesonide-formoterol (SYMBICORT) 160-4.5 MCG/ACT inhaler TAKE 1 PUFF BY MOUTH TWICE A DAY   Calcium Carb-Cholecalciferol (CALCIUM 600+D3 PO) Take 1 tablet by mouth daily.   Coenzyme Q10 (COQ-10) 100 MG capsule Take 1 capsule (100 mg total) by mouth daily.   hydrochlorothiazide (HYDRODIURIL) 12.5 MG tablet Take by mouth.   metoprolol tartrate (LOPRESSOR) 25 MG tablet Take 1 tablet (25 mg total) by mouth 2 (two) times daily.   Multiple Vitamin (MULTI-VITAMIN DAILY PO) Take by mouth.   Multiple Vitamins-Minerals (VITAMIN D3 COMPLETE PO) Take by mouth.   pantoprazole (PROTONIX) 40 MG tablet TAKE 1 TABLET (40 MG TOTAL) BY MOUTH ONCE DAILY TAKE 30 MINUTES BEFORE BREAKFAST.   rosuvastatin (CRESTOR) 20 MG tablet Take 1 tablet (20 mg total) by mouth daily.   tobramycin (TOBREX) 0.3 % ophthalmic solution INSTILL 1 DROP IN LEFT EYE 4 TIMES/DAY BEGIN 1 DAY BEFORE TREATMENT DAY OF TREATMENT AND 1 DAY AFTER   Turmeric (QC TUMERIC COMPLEX PO) Take by mouth.   No facility-administered encounter medications on file as of 01/16/2021.    Surgical History: Past Surgical History:  Procedure Laterality Date   ABDOMINAL HYSTERECTOMY  1980   due  to dysfunctional uterine bleeding   APPENDECTOMY  1980   BREAST SURGERY Left 2000   biopsy   CARDIAC CATHETERIZATION     COLONOSCOPY     CORONARY ARTERY BYPASS GRAFT  2006   HEMORRHOID SURGERY     LOWER EXTREMITY ANGIOGRAPHY Right 02/25/2017   Procedure: LOWER EXTREMITY ANGIOGRAPHY;  Surgeon: Algernon Huxley, MD;  Location: [facility];  Service: Cardiovascular;  Laterality: Right;   LOWER EXTREMITY INTERVENTION  02/25/2017   Procedure: LOWER EXTREMITY INTERVENTION;  Surgeon: Algernon Huxley, MD;  Location: [place] [facility];  Service: Cardiovascular;;   LUNG BIOPSY  1999   Negative   TONSILLECTOMY      Medical History: Past  Medical History:  Diagnosis Date   Arthritis    COPD (chronic obstructive pulmonary disease) ([place])    Coronary artery disease    GERD (gastroesophageal reflux disease)    Hyperlipidemia    Hypertension    Macular degeneration    PAD (peripheral artery disease) ([place])    Sleep apnea     Family History: Family History  Problem Relation Age of Onset   Hypertension Mother    Hyperlipidemia Mother    Alzheimer's disease Mother    CAD Mother    Heart attack Father    Lung disease Sister    Heart disease Sister    Diabetes Paternal Grandfather        Type 2   Hearing loss Son     Social History   Socioeconomic History   Marital status: Married    Spouse name: Cliffton BRALEY LUCKENBAUGH   Number of children: 2   Years of education: 12   Highest education level: 12th grade  Occupational History   Occupation: retired  Tobacco Use   Smoking status: Former    Packs/day: 0.50    Years: 5.00    Pack years: 2.50    Types: Cigarettes    Quit date: 04/24/2004    Years since quitting: 16.7   Smokeless tobacco: Never  Vaping Use   Vaping Use: Never used  Substance and Sexual Activity   Alcohol use: No   Drug use: No   Sexual activity: Yes  Other Topics Concern   Not on file  Social History Narrative   Not on file   Social Determinants of Health   Financial Resource Strain: Low Risk    Difficulty of Paying Living Expenses: Not hard at all  Food Insecurity: No Food Insecurity   Worried About Charity fundraiser in the Last Year: Never true   [place] in the Last Year: Never true  Transportation Needs: No Transportation Needs   Lack of Transportation (Medical): No   Lack of Transportation (Non-Medical): No  Physical Activity: Inactive   Days of Exercise per Week: 0 days   Minutes of Exercise per Session: 0 min  Stress: No Stress Concern Present   Feeling of Stress : Not at all  Social Connections: Moderately Integrated   Frequency of Communication with Friends and  Family: Three times a week   Frequency of Social Gatherings with Friends and Family: More than three times a week   Attends Religious Services: More than 4 times per year   Active Member of [employer] or Organizations: No   Attends Archivist Meetings: Never   Marital Status: Married  Human resources officer Violence: Not At Risk   Fear of Current or Ex-Partner: No   Emotionally Abused: No   Physically Abused:  No   Sexually Abused: No      Review of Systems  Constitutional:  Positive for fatigue. Negative for chills and unexpected weight change.  HENT:  Negative for congestion, rhinorrhea, sneezing and sore throat.   Eyes:  Negative for redness.  Respiratory:  Negative for cough, chest tightness, shortness of breath and wheezing.   Cardiovascular:  Negative for chest pain and palpitations.  Gastrointestinal:  Negative for abdominal pain, constipation, diarrhea, nausea and vomiting.  Genitourinary:  Negative for dysuria and frequency.  Musculoskeletal:  Negative for arthralgias, back pain, joint swelling and neck pain.  Skin:  Negative for rash.  Neurological: Negative.  Negative for tremors and numbness.  Hematological:  Negative for adenopathy. Does not bruise/bleed easily.  Psychiatric/Behavioral:  Positive for sleep disturbance. Negative for behavioral problems (Depression) and suicidal ideas. The patient is not nervous/anxious.    Vital Signs: BP 140/70   Pulse 78   Temp 98.4 F (36.9 C)   Resp 16   Ht 5\' 6"  (1.676 m)   Wt 165 lb 9.6 oz (75.1 kg)   SpO2 97%   BMI 26.73 kg/m    Physical Exam Vitals reviewed.  Constitutional:      General: She is not in acute distress.    Appearance: Normal appearance. She is normal weight. She is not ill-appearing.  Cardiovascular:     Rate and Rhythm: Normal rate and regular rhythm.  Neurological:     Mental Status: She is alert.       Assessment/Plan: 1. Chronic obstructive pulmonary disease, unspecified COPD type  ([place]) Continue symbicort as prescribed. No refills needed at this time, PFT showed no signficant change since PFT in October 2021. Mild restrictive and mild obstructive lung disease.   2. OSA on CPAP Doing well on CPAP, 100% compliance. No questions or concerns per patient. She does not need any supplies.follow up in 6 months.    General Counseling: Christina [name] understanding of the findings of todays visit and agrees with plan of treatment. I have discussed any further diagnostic evaluation that may be needed or ordered today. We also reviewed her medications today. she has been encouraged to call the office with any questions or concerns that should arise related to todays visit.    No orders of the defined types were placed in this encounter.   No orders of the defined types were placed in this encounter.   Return in about 6 months (around 07/17/2021) for F/U, pulmonary/sleep with Elverna Caffee or DSK.   Total time spent:20 Minutes Time spent includes review of chart, medications, test results, and follow up plan with the patient.   [place] Controlled Substance Database was reviewed by me.  This patient was seen by [name], FNP-C in collaboration with Dr. [name] as a part of collaborative care agreement.   Jayvin Hurrell R. Valetta Fuller, MSN, FNP-C Internal medicine

## 2021-01-20 ENCOUNTER — Other Ambulatory Visit (INDEPENDENT_AMBULATORY_CARE_PROVIDER_SITE_OTHER): Payer: Self-pay | Admitting: Vascular Surgery

## 2021-01-20 ENCOUNTER — Encounter: Payer: Self-pay | Admitting: Nurse Practitioner

## 2021-01-20 DIAGNOSIS — I6523 Occlusion and stenosis of bilateral carotid arteries: Secondary | ICD-10-CM

## 2021-01-23 ENCOUNTER — Other Ambulatory Visit: Payer: Self-pay

## 2021-01-23 ENCOUNTER — Ambulatory Visit (INDEPENDENT_AMBULATORY_CARE_PROVIDER_SITE_OTHER): Payer: Medicare Other | Admitting: Vascular Surgery

## 2021-01-23 ENCOUNTER — Ambulatory Visit (INDEPENDENT_AMBULATORY_CARE_PROVIDER_SITE_OTHER): Payer: Medicare Other

## 2021-01-23 VITALS — BP 140/78 | HR 99 | Ht 66.0 in | Wt 163.0 lb

## 2021-01-23 DIAGNOSIS — E7800 Pure hypercholesterolemia, unspecified: Secondary | ICD-10-CM

## 2021-01-23 DIAGNOSIS — I6523 Occlusion and stenosis of bilateral carotid arteries: Secondary | ICD-10-CM

## 2021-01-23 DIAGNOSIS — I2510 Atherosclerotic heart disease of native coronary artery without angina pectoris: Secondary | ICD-10-CM | POA: Diagnosis not present

## 2021-01-23 DIAGNOSIS — I739 Peripheral vascular disease, unspecified: Secondary | ICD-10-CM | POA: Diagnosis not present

## 2021-01-23 DIAGNOSIS — I10 Essential (primary) hypertension: Secondary | ICD-10-CM

## 2021-01-24 DIAGNOSIS — G4733 Obstructive sleep apnea (adult) (pediatric): Secondary | ICD-10-CM | POA: Diagnosis not present

## 2021-01-30 ENCOUNTER — Encounter (INDEPENDENT_AMBULATORY_CARE_PROVIDER_SITE_OTHER): Payer: Self-pay | Admitting: Vascular Surgery

## 2021-01-30 NOTE — Progress Notes (Signed)
MRN : [id number]  Christina [name] is a 85 y.o. (07/23/1935) female who presents with chief complaint of check carotid arteries.  History of Present Illness:  The patient is seen for evaluation of carotid stenosis. The carotid stenosis was identified remotely but has not been followed recently.   The patient denies amaurosis fugax. There is no recent history of TIA symptoms or focal motor deficits. There is no prior documented CVA.   There is no history of migraine headaches. There is no history of seizures.   The patient is taking enteric-coated aspirin 81 mg daily.   The patient has a history of coronary artery disease, no recent episodes of angina or shortness of breath. The patient denies PAD or claudication symptoms. There is a history of hyperlipidemia which is being treated with a statin.     Duplex ultrasound of the carotid arteries shows RICA 5-88% and LICA 50-27% No significant change compared to last study.  Current Meds  Medication Sig   acetaminophen (TYLENOL) 500 MG tablet Take 500 mg by mouth every 8 (eight) hours as needed for mild pain or moderate pain.    albuterol (VENTOLIN HFA) 108 (90 Base) MCG/ACT inhaler Inhale 2 puffs into the lungs every 6 (six) hours as needed for wheezing or shortness of breath.   amLODipine (NORVASC) 5 MG tablet Take 1 tablet (5 mg total) by mouth daily.   Ascorbic Acid (VITAMIN C) 1000 MG tablet Take 1,000 mg by mouth daily.   aspirin 81 MG tablet Take 81 mg by mouth daily.    azelastine (ASTELIN) 0.1 % nasal spray Place into the nose.   budesonide-formoterol (SYMBICORT) 160-4.5 MCG/ACT inhaler TAKE 1 PUFF BY MOUTH TWICE A DAY   Calcium Carb-Cholecalciferol (CALCIUM 600+D3 PO) Take 1 tablet by mouth daily.   Coenzyme Q10 (COQ-10) 100 MG capsule Take 1 capsule (100 mg total) by mouth daily.   hydrochlorothiazide (HYDRODIURIL) 12.5 MG tablet Take by mouth.   metoprolol tartrate (LOPRESSOR) 25 MG tablet Take 1 tablet (25 mg total) by  mouth 2 (two) times daily.   Multiple Vitamin (MULTI-VITAMIN DAILY PO) Take by mouth.   Multiple Vitamins-Minerals (VITAMIN D3 COMPLETE PO) Take by mouth.   pantoprazole (PROTONIX) 40 MG tablet TAKE 1 TABLET (40 MG TOTAL) BY MOUTH ONCE DAILY TAKE 30 MINUTES BEFORE BREAKFAST.   rosuvastatin (CRESTOR) 20 MG tablet Take 1 tablet (20 mg total) by mouth daily.   tobramycin (TOBREX) 0.3 % ophthalmic solution INSTILL 1 DROP IN LEFT EYE 4 TIMES/DAY BEGIN 1 DAY BEFORE TREATMENT DAY OF TREATMENT AND 1 DAY AFTER   Turmeric (QC TUMERIC COMPLEX PO) Take by mouth.    Past Medical History:  Diagnosis Date   Arthritis    COPD (chronic obstructive pulmonary disease) ([place])    Coronary artery disease    GERD (gastroesophageal reflux disease)    Hyperlipidemia    Hypertension    Macular degeneration    PAD (peripheral artery disease) ([place])    Sleep apnea     Past Surgical History:  Procedure Laterality Date   ABDOMINAL HYSTERECTOMY  1980   due to dysfunctional uterine bleeding   APPENDECTOMY  1980   BREAST SURGERY Left 2000   biopsy   CARDIAC CATHETERIZATION     COLONOSCOPY     CORONARY ARTERY BYPASS GRAFT  2006   HEMORRHOID SURGERY     LOWER EXTREMITY ANGIOGRAPHY Right 02/25/2017   Procedure: LOWER EXTREMITY ANGIOGRAPHY;  Surgeon: Algernon Huxley, MD;  Location: [facility];  Service: Cardiovascular;  Laterality: Right;   LOWER EXTREMITY INTERVENTION  02/25/2017   Procedure: LOWER EXTREMITY INTERVENTION;  Surgeon: Algernon Huxley, MD;  Location: [place];  Service: Cardiovascular;;   LUNG BIOPSY  1999   Negative   TONSILLECTOMY      Social History Social History   Tobacco Use   Smoking status: Former    Packs/day: 0.50    Years: 5.00    Pack years: 2.50    Types: Cigarettes    Quit date: 04/24/2004    Years since quitting: 16.7   Smokeless tobacco: Never  Vaping Use   Vaping Use: Never used  Substance Use Topics   Alcohol use: No   Drug use: No    Family  History Family History  Problem Relation Age of Onset   Hypertension Mother    Hyperlipidemia Mother    Alzheimer's disease Mother    CAD Mother    Heart attack Father    Lung disease Sister    Heart disease Sister    Diabetes Paternal Grandfather        Type 2   Hearing loss Son     Allergies  Allergen Reactions   Baclofen Other (See Comments)   Codeine Nausea Only   Plavix [Clopidogrel]     brusing all over   Sulfa Antibiotics Nausea Only   Latex Rash     REVIEW OF SYSTEMS (Negative unless checked)  Constitutional: [] Weight loss  [] Fever  [] Chills Cardiac: [] Chest pain   [] Chest pressure   [] Palpitations   [] Shortness of breath when laying flat   [] Shortness of breath with exertion. Vascular:  [] Pain in legs with walking   [] Pain in legs at rest  [] History of DVT   [] Phlebitis   [] Swelling in legs   [] Varicose veins   [] Non-healing ulcers Pulmonary:   [] Uses home oxygen   [] Productive cough   [] Hemoptysis   [] Wheeze  [] COPD   [] Asthma Neurologic:  [] Dizziness   [] Seizures   [] History of stroke   [] History of TIA  [] Aphasia   [] Vissual changes   [] Weakness or numbness in arm   [] Weakness or numbness in leg Musculoskeletal:   [] Joint swelling   [] Joint pain   [] Low back pain Hematologic:  [] Easy bruising  [] Easy bleeding   [] Hypercoagulable state   [] Anemic Gastrointestinal:  [] Diarrhea   [] Vomiting  [] Gastroesophageal reflux/heartburn   [] Difficulty swallowing. Genitourinary:  [] Chronic kidney disease   [] Difficult urination  [] Frequent urination   [] Blood in urine Skin:  [] Rashes   [] Ulcers  Psychological:  [] History of anxiety   []  History of major depression.  Physical Examination  Vitals:   01/23/21 1128  BP: 140/78  Pulse: 99  Weight: 163 lb (73.9 kg)  Height: 5\' 6"  (1.676 m)   Body mass index is 26.31 kg/m. Gen: WD/WN, NAD Head: [place]/AT, No temporalis wasting.  Ear/Nose/Throat: Hearing grossly intact, nares w/o erythema or drainage Eyes: PER, EOMI, sclera  nonicteric.  Neck: Supple, no masses.  No bruit or JVD.  Pulmonary:  Good air movement, no audible wheezing, no use of accessory muscles.  Cardiac: RRR, normal S1, S2, no Murmurs. Vascular:  left carotid bruit noted Vessel Right Left  Radial Palpable Palpable  Carotid Palpable Palpable  Gastrointestinal: soft, non-distended. No guarding/no peritoneal signs.  Musculoskeletal: M/S 5/5 throughout.  No visible deformity.  Neurologic: CN 2-12 intact. Pain and light touch intact in extremities.  Symmetrical.  Speech is fluent. Motor exam as listed above. Psychiatric: Judgment  intact, Mood & affect appropriate for pt's clinical situation. Dermatologic: No rashes or ulcers noted.  No changes consistent with cellulitis.   CBC Lab Results  Component Value Date   WBC 5.8 03/18/2020   HGB 13.4 03/18/2020   HCT 40.0 03/18/2020   MCV 103 (H) 03/18/2020   PLT 153 03/18/2020    BMET    Component Value Date/Time   NA 142 03/18/2020 1059   K 4.7 03/18/2020 1059   CL 103 03/18/2020 1059   CO2 22 03/18/2020 1059   GLUCOSE 97 03/18/2020 1059   GLUCOSE 106 (H) 05/19/2010 0636   BUN 23 03/18/2020 1059   CREATININE 1.24 (H) 03/18/2020 1059   CALCIUM 9.3 03/18/2020 1059   GFRNONAA 41 (L) 03/18/2020 1059   GFRAA 47 (L) 03/18/2020 1059   CrCl cannot be calculated (Patient's most recent lab result is older than the maximum 21 days allowed.).  COAG No results found for: INR, PROTIME  Radiology VAS US CAROTID  Result Date: 01/23/2021 Carotid Arterial Duplex Study Patient Name:  Christina [name]  Date of Exam:   01/23/2021 Medical Rec #: [id number]            Accession #:    [id number] Date of Birth: August 21, 1935            Patient Gender: F Patient Age:   77 years Exam Location:  [facility] Procedure:      VAS US CAROTID Referring Phys: Hortencia Pilar --------------------------------------------------------------------------------  Indications:       Carotid artery disease.  Comparison Study:  07/18/2020; In comparison to prior study the Velocities                    appear to be decreased in the Left ICA. Performing Technologist: Almira Coaster RVS  Examination Guidelines: A complete evaluation includes B-mode imaging, spectral Doppler, color Doppler, and power Doppler as needed of all accessible portions of each vessel. Bilateral testing is considered an integral part of a complete examination. Limited examinations for reoccurring indications may be performed as noted.  Right Carotid Findings: +----------+--------+--------+--------+------------------+--------+             PSV cm/s EDV cm/s Stenosis Plaque Description Comments  +----------+--------+--------+--------+------------------+--------+  CCA Prox   76       20                                             +----------+--------+--------+--------+------------------+--------+  CCA Mid    80       16                                             +----------+--------+--------+--------+------------------+--------+  CCA Distal 78       21                                             +----------+--------+--------+--------+------------------+--------+  ICA Prox   87       16                                             +----------+--------+--------+--------+------------------+--------+  ICA Mid    85       21                                             +----------+--------+--------+--------+------------------+--------+  ICA Distal 102      20                                             +----------+--------+--------+--------+------------------+--------+  ECA        177      17                                             +----------+--------+--------+--------+------------------+--------+ +----------+--------+-------+--------+-------------------+             PSV cm/s EDV cms Describe Arm Pressure (mmHG)  +----------+--------+-------+--------+-------------------+  Subclavian 91       0                                      +----------+--------+-------+--------+-------------------+ +---------+--------+--+--------+-+  Vertebral PSV cm/s 30 EDV cm/s 6  +---------+--------+--+--------+-+  Left Carotid Findings: +----------+--------+--------+--------+------------------+--------+             PSV cm/s EDV cm/s Stenosis Plaque Description Comments  +----------+--------+--------+--------+------------------+--------+  CCA Prox   90       14                                             +----------+--------+--------+--------+------------------+--------+  CCA Mid    95       26                                             +----------+--------+--------+--------+------------------+--------+  CCA Distal 84       19                                             +----------+--------+--------+--------+------------------+--------+  ICA Prox   105      28                                             +----------+--------+--------+--------+------------------+--------+  ICA Mid    138      21                                             +----------+--------+--------+--------+------------------+--------+  ICA Distal 118      21                                             +----------+--------+--------+--------+------------------+--------+  ECA        102      15                                             +----------+--------+--------+--------+------------------+--------+ +----------+--------+--------+--------+-------------------+             PSV cm/s EDV cm/s Describe Arm Pressure (mmHG)  +----------+--------+--------+--------+-------------------+  Subclavian 116      0                                      +----------+--------+--------+--------+-------------------+ +---------+--------+--+--------+--+  Vertebral PSV cm/s 39 EDV cm/s 11  +---------+--------+--+--------+--+   Summary: Right Carotid: Velocities in the right ICA are consistent with a 1-39% stenosis. Left Carotid: Velocities in the left ICA are consistent with a 40-59% stenosis. Vertebrals:  Bilateral  vertebral arteries demonstrate antegrade flow. Subclavians: Normal flow hemodynamics were seen in bilateral subclavian              arteries. *See table(s) above for measurements and observations.  Electronically signed by Hortencia Pilar MD on 01/23/2021 at 5:06:03 PM.    Final      Assessment/Plan There are no diagnoses linked to this encounter.   Hortencia Pilar, MD  01/30/2021 2:57 PM

## 2021-01-31 DIAGNOSIS — H353213 Exudative age-related macular degeneration, right eye, with inactive scar: Secondary | ICD-10-CM | POA: Diagnosis not present

## 2021-01-31 DIAGNOSIS — H353221 Exudative age-related macular degeneration, left eye, with active choroidal neovascularization: Secondary | ICD-10-CM | POA: Diagnosis not present

## 2021-01-31 DIAGNOSIS — H35432 Paving stone degeneration of retina, left eye: Secondary | ICD-10-CM | POA: Diagnosis not present

## 2021-01-31 DIAGNOSIS — H43813 Vitreous degeneration, bilateral: Secondary | ICD-10-CM | POA: Diagnosis not present

## 2021-02-13 DIAGNOSIS — Z1231 Encounter for screening mammogram for malignant neoplasm of breast: Secondary | ICD-10-CM | POA: Diagnosis not present

## 2021-02-13 LAB — HM MAMMOGRAPHY

## 2021-02-15 DIAGNOSIS — I10 Essential (primary) hypertension: Secondary | ICD-10-CM | POA: Diagnosis not present

## 2021-02-15 DIAGNOSIS — I071 Rheumatic tricuspid insufficiency: Secondary | ICD-10-CM | POA: Diagnosis not present

## 2021-02-15 DIAGNOSIS — Z951 Presence of aortocoronary bypass graft: Secondary | ICD-10-CM | POA: Diagnosis not present

## 2021-02-15 DIAGNOSIS — R609 Edema, unspecified: Secondary | ICD-10-CM | POA: Diagnosis not present

## 2021-02-15 DIAGNOSIS — E782 Mixed hyperlipidemia: Secondary | ICD-10-CM | POA: Diagnosis not present

## 2021-02-15 DIAGNOSIS — I6523 Occlusion and stenosis of bilateral carotid arteries: Secondary | ICD-10-CM | POA: Diagnosis not present

## 2021-02-15 DIAGNOSIS — R0609 Other forms of dyspnea: Secondary | ICD-10-CM | POA: Diagnosis not present

## 2021-02-15 DIAGNOSIS — R42 Dizziness and giddiness: Secondary | ICD-10-CM | POA: Diagnosis not present

## 2021-02-15 DIAGNOSIS — I739 Peripheral vascular disease, unspecified: Secondary | ICD-10-CM | POA: Diagnosis not present

## 2021-02-15 DIAGNOSIS — G4733 Obstructive sleep apnea (adult) (pediatric): Secondary | ICD-10-CM | POA: Diagnosis not present

## 2021-02-15 DIAGNOSIS — I25118 Atherosclerotic heart disease of native coronary artery with other forms of angina pectoris: Secondary | ICD-10-CM | POA: Diagnosis not present

## 2021-02-15 DIAGNOSIS — J449 Chronic obstructive pulmonary disease, unspecified: Secondary | ICD-10-CM | POA: Diagnosis not present

## 2021-02-24 DIAGNOSIS — G4733 Obstructive sleep apnea (adult) (pediatric): Secondary | ICD-10-CM | POA: Diagnosis not present

## 2021-03-07 DIAGNOSIS — H353221 Exudative age-related macular degeneration, left eye, with active choroidal neovascularization: Secondary | ICD-10-CM | POA: Diagnosis not present

## 2021-03-07 DIAGNOSIS — H43813 Vitreous degeneration, bilateral: Secondary | ICD-10-CM | POA: Diagnosis not present

## 2021-03-07 DIAGNOSIS — H26493 Other secondary cataract, bilateral: Secondary | ICD-10-CM | POA: Diagnosis not present

## 2021-03-07 DIAGNOSIS — H353213 Exudative age-related macular degeneration, right eye, with inactive scar: Secondary | ICD-10-CM | POA: Diagnosis not present

## 2021-03-22 NOTE — Progress Notes (Signed)
Established patient visit   Patient: Christina [name]   DOB: 05-30-1935   86 y.o. Female  MRN: [id number] Visit Date: 03/23/2021  Today's healthcare provider: [name], FNP   No chief complaint on file.  Subjective    HPI  Hypertension, follow-up  BP Readings from Last 3 Encounters:  03/23/21 (!) 142/93  01/23/21 140/78  01/16/21 140/70   Wt Readings from Last 3 Encounters:  03/23/21 165 lb (74.8 kg)  01/23/21 163 lb (73.9 kg)  01/16/21 165 lb 9.6 oz (75.1 kg)     She was last seen for hypertension 5 months ago.  BP at that visit was 174/70. Management since that visit includes adding Amlodipine.  She reports good compliance with treatment. She is having side effects.  Patient states she does not feel well at all.  She states she is tired all the time and can not sleep. She is following a Regular diet. She is not exercising. She does not smoke.  Use of agents associated with hypertension: none.   Outside blood pressures are being checked infrequently but when they are they run mainly 130's-150' over 70's and 80's.. Symptoms: No chest pain No chest pressure  No palpitations No syncope  No dyspnea No orthopnea  No paroxysmal nocturnal dyspnea No lower extremity edema   Patient states she has brief episodes where her whole body feels flushed.   She states it only last seconds..  Pertinent labs: Lab Results  Component Value Date   CHOL 159 03/18/2020   HDL 59 03/18/2020   LDLCALC 75 03/18/2020   TRIG 143 03/18/2020   CHOLHDL 3.0 02/20/2019   Lab Results  Component Value Date   NA 142 03/18/2020   K 4.7 03/18/2020   CREATININE 1.24 (H) 03/18/2020   GFRNONAA 41 (L) 03/18/2020   GLUCOSE 97 03/18/2020   TSH 4.590 (H) 03/18/2020     The ASCVD Risk score (Arnett DK, et al., 2019) failed to calculate for the following reasons:   The 2019 ASCVD risk score is only valid for ages 48 to 59    ---------------------------------------------------------------------------------------------------   Medications: Outpatient Medications Prior to Visit  Medication Sig   amLODipine (NORVASC) 5 MG tablet Take 1 tablet (5 mg total) by mouth daily.   Ascorbic Acid (VITAMIN C) 1000 MG tablet Take 1,000 mg by mouth daily.   aspirin 81 MG tablet Take 81 mg by mouth daily.    Calcium Carb-Cholecalciferol (CALCIUM 600+D3 PO) Take 1 tablet by mouth daily.   Coenzyme Q10 (COQ-10) 100 MG capsule Take 1 capsule (100 mg total) by mouth daily.   hydrochlorothiazide (HYDRODIURIL) 12.5 MG tablet Take by mouth.   metoprolol tartrate (LOPRESSOR) 25 MG tablet Take 1 tablet (25 mg total) by mouth 2 (two) times daily.   Multiple Vitamin (MULTI-VITAMIN DAILY PO) Take by mouth.   rosuvastatin (CRESTOR) 20 MG tablet Take 1 tablet (20 mg total) by mouth daily.   acetaminophen (TYLENOL) 500 MG tablet Take 500 mg by mouth every 8 (eight) hours as needed for mild pain or moderate pain.  (Patient not taking: Reported on 03/23/2021)   albuterol (VENTOLIN HFA) 108 (90 Base) MCG/ACT inhaler Inhale 2 puffs into the lungs every 6 (six) hours as needed for wheezing or shortness of breath. (Patient not taking: Reported on 03/23/2021)   azelastine (ASTELIN) 0.1 % nasal spray Place into the nose. (Patient not taking: Reported on 03/23/2021)   budesonide-formoterol (SYMBICORT) 160-4.5 MCG/ACT inhaler TAKE 1 PUFF BY MOUTH  TWICE A DAY (Patient not taking: Reported on 03/23/2021)   Multiple Vitamins-Minerals (VITAMIN D3 COMPLETE PO) Take by mouth. (Patient not taking: Reported on 03/23/2021)   pantoprazole (PROTONIX) 40 MG tablet TAKE 1 TABLET (40 MG TOTAL) BY MOUTH ONCE DAILY TAKE 30 MINUTES BEFORE BREAKFAST. (Patient not taking: Reported on 03/23/2021)   tobramycin (TOBREX) 0.3 % ophthalmic solution INSTILL 1 DROP IN LEFT EYE 4 TIMES/DAY BEGIN 1 DAY BEFORE TREATMENT DAY OF TREATMENT AND 1 DAY AFTER (Patient not taking: Reported on  03/23/2021)   Turmeric (QC TUMERIC COMPLEX PO) Take by mouth. (Patient not taking: Reported on 03/23/2021)   No facility-administered medications prior to visit.    Review of Systems      Objective    BP (!) 142/93 (BP Location: Right Arm, Patient Position: Sitting, Cuff Size: Normal)    Pulse 96    Temp 97.9 F (36.6 C) (Oral)    Wt 165 lb (74.8 kg)    SpO2 97%    BMI 26.63 kg/m    Vitals:   03/23/21 1312 03/23/21 1327  BP: (!) 146/100 (!) 142/93  Pulse: 96   Temp: 97.9 F (36.6 C)   TempSrc: Oral   SpO2: 97%   Weight: 165 lb (74.8 kg)     Physical Exam Vitals and nursing note reviewed.  Constitutional:      General: She is not in acute distress.    Appearance: Normal appearance. She is overweight. She is not ill-appearing, toxic-appearing or diaphoretic.  HENT:     Head: Normocephalic and atraumatic.  Cardiovascular:     Rate and Rhythm: Normal rate and regular rhythm.     Pulses: Normal pulses.     Heart sounds: Normal heart sounds. No murmur heard.   No friction rub. No gallop.  Pulmonary:     Effort: Pulmonary effort is normal. No respiratory distress.     Breath sounds: Normal breath sounds. No stridor. No wheezing, rhonchi or rales.  Chest:     Chest wall: No tenderness.  Abdominal:     General: Bowel sounds are normal.     Palpations: Abdomen is soft.  Musculoskeletal:        General: No swelling, tenderness, deformity or signs of injury. Normal range of motion.     Right lower leg: No edema.     Left lower leg: No edema.  Skin:    General: Skin is warm and dry.     Capillary Refill: Capillary refill takes less than 2 seconds.     Coloration: Skin is not jaundiced or pale.     Findings: No bruising, erythema, lesion or rash.  Neurological:     General: No focal deficit present.     Mental Status: She is alert and oriented to person, place, and time. Mental status is at baseline.     Cranial Nerves: No cranial nerve deficit.     Sensory: No sensory  deficit.     Motor: No weakness.     Coordination: Coordination normal.  Psychiatric:        Mood and Affect: Mood normal.        Behavior: Behavior normal.        Thought Content: Thought content normal.        Cognition and Memory: Memory is impaired.        Judgment: Judgment normal.     Comments: Difficulty understanding medication changes with Norvasc and BB      No results found for any visits on  03/23/21.  Assessment & Plan     Problem List Items Addressed This Visit       Cardiovascular and Mediastinum   Primary hypertension - Primary    Home logs reviewed, stable Filled pill box to assist with compliance CCM referral placed      Relevant Orders   AMB Referral to [place]     Other   Hyperlipidemia    Pt was taking both simvastatin and crestor from her report Pt disposed of simvastatin in office today to prevent further mishaps recommend diet low in saturated fat and regular exercise - 30 min at least 5 times per week       Relevant Orders   AMB Referral to [place]   Memory loss    Confusion with metop and norvasc      Relevant Orders   AMB Referral to [place]     Return in about 2 weeks (around 04/06/2021) for chonic disease management.      Argentina Ponder DeSanto,acting as a scribe for [name], FNP.,have documented all relevant documentation on the behalf of [name], FNP,as directed by  [name], FNP while in the presence of [name], FNP.  Vonna Kotyk, FNP, have reviewed all documentation for this visit. The documentation on 03/23/21 for the exam, diagnosis, procedures, and orders are all accurate and complete.    [name], [place] [phone] (phone) [phone] (fax)  [place]

## 2021-03-23 ENCOUNTER — Ambulatory Visit (INDEPENDENT_AMBULATORY_CARE_PROVIDER_SITE_OTHER): Payer: Medicare Other | Admitting: Family Medicine

## 2021-03-23 ENCOUNTER — Encounter: Payer: Self-pay | Admitting: Family Medicine

## 2021-03-23 ENCOUNTER — Other Ambulatory Visit: Payer: Self-pay

## 2021-03-23 VITALS — BP 135/78 | HR 96 | Temp 97.9°F | Wt 165.0 lb

## 2021-03-23 DIAGNOSIS — R413 Other amnesia: Secondary | ICD-10-CM

## 2021-03-23 DIAGNOSIS — E7800 Pure hypercholesterolemia, unspecified: Secondary | ICD-10-CM

## 2021-03-23 DIAGNOSIS — I10 Essential (primary) hypertension: Secondary | ICD-10-CM | POA: Diagnosis not present

## 2021-03-23 NOTE — Assessment & Plan Note (Signed)
Home logs reviewed, stable Filled pill box to assist with compliance CCM referral placed

## 2021-03-23 NOTE — Assessment & Plan Note (Signed)
Confusion with metop and norvasc

## 2021-03-23 NOTE — Assessment & Plan Note (Signed)
Pt was taking both simvastatin and crestor from her report Pt disposed of simvastatin in office today to prevent further mishaps recommend diet low in saturated fat and regular exercise - 30 min at least 5 times per week

## 2021-03-24 ENCOUNTER — Telehealth: Payer: Self-pay | Admitting: *Deleted

## 2021-03-24 NOTE — Chronic Care Management (AMB) (Signed)
°  Chronic Care Management   Outreach Note  03/24/2021 Name: Christina [name] MRN: [id number] DOB: 1935/12/31  Christina [name] is a 86 y.o. year old female who is a primary care patient of [name], FNP. I reached out to Christina [name] by phone today in response to a referral sent by [name]'s primary care provider.  An unsuccessful telephone outreach was attempted today. The patient was referred to the case management team for assistance with care management and care coordination.   Follow Up Plan: A HIPAA compliant phone message was left for the patient providing contact information and requesting a return call.  If patient returns call to provider office, please advise to call Embedded Care Management Care Guide Jabril Pursell at [place] [place] [facility]  Direct Dial: [phone]

## 2021-03-24 NOTE — Chronic Care Management (AMB) (Signed)
Chronic Care Management   Note  03/24/2021 Name: Christina [name] MRN: [id number] DOB: [birth year]  Christina [name] is a 86 y.o. year old female who is a primary care patient of [name], FNP. I reached out to [name] by phone today in response to a referral sent by [name]'s PCP.  [name] was given information about Chronic Care Management services today including:  CCM service includes personalized support from designated clinical staff supervised by her physician, including individualized plan of care and coordination with other care providers 24/7 contact phone numbers for assistance for urgent and routine care needs. Service will only be billed when office clinical staff spend 20 minutes or more in a month to coordinate care. Only one practitioner may furnish and bill the service in a calendar month. The patient may stop CCM services at any time (effective at the end of the month) by phone call to the office staff. The patient is responsible for co-pay (up to 20% after annual deductible is met) if co-pay is required by the individual health plan.   Patient agreed to services and verbal consent obtained.   Follow up plan: Telephone appointment with care management team member scheduled for: 03/31/2021  [facility], [place] [facility]  Direct Dial: [phone]

## 2021-03-27 DIAGNOSIS — G4733 Obstructive sleep apnea (adult) (pediatric): Secondary | ICD-10-CM | POA: Diagnosis not present

## 2021-03-29 ENCOUNTER — Telehealth: Payer: Self-pay

## 2021-03-29 NOTE — Progress Notes (Signed)
Chronic Care Management Pharmacy Assistant   Name: Christina [name]  MRN: [id number] DOB: 01-02-1936  Chart Review for the Clinical pharmacist on 03/31/2021 at 9:30 am.   Conditions to be addressed/monitored: HTN, HLD, COPD, CKD Stage III, GERD, Osteopenia, and Atherosclerosis of coronary artery, PAD, Arthritis, Memory loss   Primary concerns for visit include: Patient reports she is confused about her medications.Patient states she uses a pillbox  she fills up weekly, and when there is a change to her medication list she is unsure what she is suppose to be taking and what to take out her pillbox.Patient states if she is unfamiliar with the medications, she will not take it as she wants to know what exactly it is for.Patient states she takes 11 pills in the morning, which some are vitamins. Patient reports she has been taking Vitamins for years, and prefer not to stop taking any of her vitamins.Patient feels she is taking to many medications, and would like to decrease how much she is taking.   Recent office visits:  03/23/2021 [name] FNP (PCP) AMB Referral to [facility] Coordination, No Medication Changes noted, Return in about 2 weeks  11/24/2020 [name] FNP (PCP) No Medication Changes noted 11/03/2020 [name] FNP (PCP) Stop Simvastatin, start Rosuvastatin 20 mg daily, Start Amlodipine 5 mg daily, Start Coenzyme Q10 100 mg daily, Return in about 3 weeks   Recent consult visits:  02/15/2021 Dr. [name] MD (Cardiology) Stop Simvastatin,follow-up in 4 months 01/23/2021 Dr. [name] MD (Vascular Surgery) No Medication Changes noted, Return in 6 months 01/16/2021 [name] NP (Pulmonology) No Medication Changes noted 11/22/2020 [name] PA (Ophthalmology) Unable to see note 11/09/2020 Dr. Humphrey Rolls MD (Pulmonology) Pulmonary Function test completed 10/17/2020 Dr. [name] MD (Cardiology) No Medication changes noted, follow up in 4 months 10/07/2020 [name]  PA (Ophthalmology) Unable to see note  Hospital visits:  None in previous 6 months   Have you seen any other providers since your last visit?   Patient denies seeing any other providers.  Any changes in your medications or health?   Patient denies any changes from her Medications.  Any side effects from any medications?   Patient denies any side effects, but not 100% sure.  Do you have an symptoms or problems not managed by your medications?   Patient reports she does feels "crazy headed, not dizziness just enough where I have to hold on to something where I do not fall".  Any concerns about your health right now?   Patient reports she is confused about her medications.Patient states she uses a pillbox  she fills up weekly, and when there is a change to her medication list she is unsure what she is suppose to be taking and what to take out her pillbox.Patient states if she is unfamiliar with the medications, she will not take it as she wants to know what exactly it is for.Patient states she takes 11 pills in the morning, which some are vitamins. Patient reports she has been taking Vitamins for years, and prefer not to stop taking any of her vitamins.Patient feels she is taking to many medications, and would like to decrease how much she is taking.  Has your provider asked that you check blood pressure, blood sugar, or follow special diet at home?   Patient reports she checks her blood pressure daily sometimes twice daily.  Do you get any type of exercise on a regular basis?   Patient states she recently purchase  a walker, and she  walks around her neighborhood while confirming her neighborhood is safe.Patient reports she walks as much as she can depending on her breathing due to COPD. Patient may have to stop, and rest a few minutes then she will continue walking.Patient reports she is active doing household chores daily.  Can you think of a goal you would like to reach for your health?    Patient will inform the clinical pharmacist.  Do you have any problems getting your medications?   Patient denies any issue or problems getting her medications.  Is there anything that you would like to discuss during the appointment?   Patient reports she is confused about her medications.Patient states she uses a pillbox  she fills up weekly, and when there is a change to her medication list she is unsure what she is suppose to be taking and what to take out her pillbox.Patient states if she is unfamiliar with the medications, she will not take it as she wants to know what exactly it is for.Patient states she takes 11 pills in the morning, which some are vitamins. Patient reports she has been taking Vitamins for years, and prefer not to stop taking any of her vitamins.Patient feels she is taking to many medications, and would like to decrease how much she is taking.   Please bring medications and supplements to appointment  Patient is aware to have all medications and supplements at her appointment.  Patient is aware she can return my call if she has any questions or concerns.  Medications: Outpatient Encounter Medications as of 03/29/2021  Medication Sig   acetaminophen (TYLENOL) 500 MG tablet Take 500 mg by mouth every 8 (eight) hours as needed for mild pain or moderate pain.  (Patient not taking: Reported on 03/23/2021)   albuterol (VENTOLIN HFA) 108 (90 Base) MCG/ACT inhaler Inhale 2 puffs into the lungs every 6 (six) hours as needed for wheezing or shortness of breath. (Patient not taking: Reported on 03/23/2021)   amLODipine (NORVASC) 5 MG tablet Take 1 tablet (5 mg total) by mouth daily.   Ascorbic Acid (VITAMIN C) 1000 MG tablet Take 1,000 mg by mouth daily.   aspirin 81 MG tablet Take 81 mg by mouth daily.    azelastine (ASTELIN) 0.1 % nasal spray Place into the nose. (Patient not taking: Reported on 03/23/2021)   budesonide-formoterol (SYMBICORT) 160-4.5 MCG/ACT inhaler TAKE 1 PUFF  BY MOUTH TWICE A DAY (Patient not taking: Reported on 03/23/2021)   Calcium Carb-Cholecalciferol (CALCIUM 600+D3 PO) Take 1 tablet by mouth daily.   Coenzyme Q10 (COQ-10) 100 MG capsule Take 1 capsule (100 mg total) by mouth daily.   hydrochlorothiazide (HYDRODIURIL) 12.5 MG tablet Take by mouth.   metoprolol tartrate (LOPRESSOR) 25 MG tablet Take 1 tablet (25 mg total) by mouth 2 (two) times daily.   Multiple Vitamin (MULTI-VITAMIN DAILY PO) Take by mouth.   Multiple Vitamins-Minerals (VITAMIN D3 COMPLETE PO) Take by mouth. (Patient not taking: Reported on 03/23/2021)   pantoprazole (PROTONIX) 40 MG tablet TAKE 1 TABLET (40 MG TOTAL) BY MOUTH ONCE DAILY TAKE 30 MINUTES BEFORE BREAKFAST. (Patient not taking: Reported on 03/23/2021)   rosuvastatin (CRESTOR) 20 MG tablet Take 1 tablet (20 mg total) by mouth daily.   tobramycin (TOBREX) 0.3 % ophthalmic solution INSTILL 1 DROP IN LEFT EYE 4 TIMES/DAY BEGIN 1 DAY BEFORE TREATMENT DAY OF TREATMENT AND 1 DAY AFTER (Patient not taking: Reported on 03/23/2021)   Turmeric (QC TUMERIC COMPLEX PO) Take by  mouth. (Patient not taking: Reported on 03/23/2021)   No facility-administered encounter medications on file as of 03/29/2021.    Care Gaps: Shingrix Vaccine Dexa Scan COVID-19 Vaccine  Star Rating Drugs: Rosuvastatin 20 mg last filled 01/21/2021 [facility] day [facility]  Medication Fill Gaps: None ID  [place] Clinical Pharmacist Assistant [phone]

## 2021-03-31 ENCOUNTER — Ambulatory Visit (INDEPENDENT_AMBULATORY_CARE_PROVIDER_SITE_OTHER): Payer: Medicare Other

## 2021-03-31 ENCOUNTER — Other Ambulatory Visit: Payer: Self-pay | Admitting: Family Medicine

## 2021-03-31 DIAGNOSIS — I10 Essential (primary) hypertension: Secondary | ICD-10-CM

## 2021-03-31 DIAGNOSIS — E7800 Pure hypercholesterolemia, unspecified: Secondary | ICD-10-CM

## 2021-03-31 NOTE — Telephone Encounter (Signed)
Requested medication (s) are due for refill today - no  Requested medication (s) are on the active medication list -no  Future visit scheduled -no  Last refill: no longer current on list  Notes to clinic: Request RF: medications no longer current on medication list- sent for review of request  Requested Prescriptions  Pending Prescriptions Disp Refills   amLODipine (NORVASC) 5 MG tablet [Pharmacy Med Name: AMLODIPINE BESYLATE 5 MG TAB] 90 tablet 1    Sig: Take 1 tablet (5 mg total) by mouth daily.     Cardiovascular: Calcium Channel Blockers 2 Passed - 03/31/2021  3:24 PM      Passed - Last BP in normal range    BP Readings from Last 1 Encounters:  03/23/21 135/78          Passed - Last Heart Rate in normal range    Pulse Readings from Last 1 Encounters:  03/23/21 96          Passed - Valid encounter within last 6 months    Recent Outpatient Visits           1 week ago Primary hypertension   [facility] [name], FNP   4 months ago Primary hypertension   [facility] [name], FNP   1 year ago Annual physical exam   [facility] [place] Malling M, PA-C   1 year ago Complicated UTI (urinary tract infection)   [facility] [place], [place], [place]   1 year ago Right lower quadrant abdominal pain   [facility] [place], Dionne Bucy, MD               simvastatin (ZOCOR) 40 MG tablet [Pharmacy Med Name: SIMVASTATIN 40 MG TABLET] 90 tablet 1    Sig: TAKE 1 TABLET BY MOUTH EVERYDAY AT BEDTIME     Cardiovascular:  Antilipid - Statins Failed - 03/31/2021  3:24 PM      Failed - Lipid Panel in normal range within the last 12 months    Cholesterol, Total  Date Value Ref Range Status  03/18/2020 159 100 - 199 mg/dL Final   LDL Chol Calc (NIH)  Date Value Ref Range Status  03/18/2020 75 0 - 99 mg/dL Final   HDL  Date Value Ref Range Status  03/18/2020 59 >39 mg/dL Final    Triglycerides  Date Value Ref Range Status  03/18/2020 143 0 - 149 mg/dL Final         Passed - Patient is not pregnant      Passed - Valid encounter within last 12 months    Recent Outpatient Visits           1 week ago Primary hypertension   [facility] [name], FNP   4 months ago Primary hypertension   [facility] [name], FNP   1 year ago Annual physical exam   [place], Clearnce Sorrel, PA-C   1 year ago Complicated UTI (urinary tract infection)   [place], [place], [place]   1 year ago Right lower quadrant abdominal pain   [facility] [place], Dionne Bucy, MD                 Requested Prescriptions  Pending Prescriptions Disp Refills   amLODipine (NORVASC) 5 MG tablet [Pharmacy Med Name: AMLODIPINE BESYLATE 5 MG TAB] 90 tablet 1    Sig: Take 1 tablet (5 mg  total) by mouth daily.     Cardiovascular: Calcium Channel Blockers 2 Passed - 03/31/2021  3:24 PM      Passed - Last BP in normal range    BP Readings from Last 1 Encounters:  03/23/21 135/78          Passed - Last Heart Rate in normal range    Pulse Readings from Last 1 Encounters:  03/23/21 96          Passed - Valid encounter within last 6 months    Recent Outpatient Visits           1 week ago Primary hypertension   [facility] [name], FNP   4 months ago Primary hypertension   [facility] [name], FNP   1 year ago Annual physical exam   [facility] Fenton Malling M, PA-C   1 year ago Complicated UTI (urinary tract infection)   [facility] [place], [place], [place]   1 year ago Right lower quadrant abdominal pain   [facility] [place], Dionne Bucy, MD               simvastatin (ZOCOR) 40 MG tablet [Pharmacy Med Name: SIMVASTATIN 40 MG TABLET] 90 tablet 1    Sig: TAKE 1 TABLET BY MOUTH  EVERYDAY AT BEDTIME     Cardiovascular:  Antilipid - Statins Failed - 03/31/2021  3:24 PM      Failed - Lipid Panel in normal range within the last 12 months    Cholesterol, Total  Date Value Ref Range Status  03/18/2020 159 100 - 199 mg/dL Final   LDL Chol Calc (NIH)  Date Value Ref Range Status  03/18/2020 75 0 - 99 mg/dL Final   HDL  Date Value Ref Range Status  03/18/2020 59 >39 mg/dL Final   Triglycerides  Date Value Ref Range Status  03/18/2020 143 0 - 149 mg/dL Final         Passed - Patient is not pregnant      Passed - Valid encounter within last 12 months    Recent Outpatient Visits           1 week ago Primary hypertension   [facility] [name], FNP   4 months ago Primary hypertension   [facility] [name], FNP   1 year ago Annual physical exam   [place], Clearnce Sorrel, PA-C   1 year ago Complicated UTI (urinary tract infection)   [place], [place], [place]   1 year ago Right lower quadrant abdominal pain   [facility] [place], Dionne Bucy, MD

## 2021-03-31 NOTE — Progress Notes (Signed)
Chronic Care Management Pharmacy Note  04/10/2021 Name:  Christina [name] MRN:  [id number] DOB:  1935/03/19  Summary: Patient presents for Initial CCM Consult. Patient reports Light-headedness, dizziness.  -Patient denies consistent use of maintenance inhaler.   Recommendations/Changes made from today's visit: Continue current medications   Plan: CPP follow-up 3 months   Subjective: Christina [name] is an 86 y.o. year old female who is a primary patient of [name], [place].  The CCM team was consulted for assistance with disease management and care coordination needs.    Engaged with patient by telephone for initial visit in response to provider referral for pharmacy case management and/or care coordination services.   Consent to Services:  The patient was given the following information about Chronic Care Management services today, agreed to services, and gave verbal consent: 1. CCM service includes personalized support from designated clinical staff supervised by the primary care provider, including individualized plan of care and coordination with other care providers 2. 24/7 contact phone numbers for assistance for urgent and routine care needs. 3. Service will only be billed when office clinical staff spend 20 minutes or more in a month to coordinate care. 4. Only one practitioner may furnish and bill the service in a calendar month. 5.The patient may stop CCM services at any time (effective at the end of the month) by phone call to the office staff. 6. The patient will be responsible for cost sharing (co-pay) of up to 20% of the service fee (after annual deductible is met). Patient agreed to services and consent obtained.  Patient Care Team: [name], FNP as PCP - General (Family Medicine) [name] as Consulting Physician (Pulmonary Disease) [name], MD as Consulting Physician (Ophthalmology)  Recent office visits: 03/23/2021 [name] FNP (PCP) AMB  Referral to [facility] Coordination, No Medication Changes noted, Return in about 2 weeks  11/24/2020 [name] FNP (PCP) No Medication Changes noted 11/03/2020 [name] FNP (PCP) Stop Simvastatin, start Rosuvastatin 20 mg daily, Start Amlodipine 5 mg daily, Start Coenzyme Q10 100 mg daily, Return in about 3 weeks   Recent consult visits: 02/15/2021 Dr. [name] MD (Cardiology) Stop Simvastatin,follow-up in 4 months 01/23/2021 Dr. [name] MD (Vascular Surgery) No Medication Changes noted, Return in 6 months 01/16/2021 [name] NP (Pulmonology) No Medication Changes noted 11/22/2020 [name] PA (Ophthalmology) Unable to see note 11/09/2020 Dr. Humphrey Rolls MD (Pulmonology) Pulmonary Function test completed 10/17/2020 Dr. [name] MD (Cardiology) No Medication changes noted, follow up in 4 months 10/07/2020 [name] PA (Ophthalmology) Unable to see note  Hospital visits: None in previous 6 months   Objective:  Lab Results  Component Value Date   CREATININE 1.06 (H) 04/10/2021   BUN 32 (H) 04/10/2021   GFRNONAA 52 (L) 04/10/2021   GFRAA 47 (L) 03/18/2020   NA 139 04/10/2021   K 3.9 04/10/2021   CALCIUM 8.3 (L) 04/10/2021   CO2 24 04/10/2021   GLUCOSE 167 (H) 04/10/2021    Lab Results  Component Value Date/Time   HGBA1C 5.9 (H) 03/18/2020 10:59 AM   HGBA1C 5.8 (H) 02/20/2019 09:58 AM   HGBA1C 6.0 12/28/2013 12:00 AM    Last diabetic Eye exam: No results found for: HMDIABEYEEXA  Last diabetic Foot exam: No results found for: HMDIABFOOTEX   Lab Results  Component Value Date   CHOL 159 03/18/2020   HDL 59 03/18/2020   LDLCALC 75 03/18/2020   TRIG 143 03/18/2020   CHOLHDL 3.0 02/20/2019  Hepatic Function Latest Ref Rng & Units 03/18/2020 07/14/2019 02/20/2019  Total Protein 6.0 - 8.5 g/dL 6.5 6.6 6.5  Albumin 3.6 - 4.6 g/dL 4.5 4.3 4.4  AST 0 - 40 IU/L _0 ALT 0 - 32 IU/L _1 Alk Phosphatase 44 - 121 IU/L 92 85 85  Total Bilirubin 0.0 - 1.2  mg/dL 0.5 0.4 0.5    Lab Results  Component Value Date/Time   TSH 4.590 (H) 03/18/2020 10:59 AM   TSH 3.470 02/20/2019 09:58 AM    CBC Latest Ref Rng & Units 04/09/2021 03/18/2020 07/14/2019  WBC 4.0 - 10.5 K/uL 7.1 5.8 6.3  Hemoglobin 12.0 - 15.0 g/dL 12.3 13.4 13.3  Hematocrit 36.0 - 46.0 % 38.4 40.0 40.6  Platelets 150 - 400 K/uL 189 153 174    No results found for: VD25OH  Clinical ASCVD: No  The ASCVD Risk score (Arnett DK, et al., 2019) failed to calculate for the following reasons:   The 2019 ASCVD risk score is only valid for ages 52 to 91    Depression screen PHQ 2/9 03/23/2021 03/18/2020 02/23/2020  Decreased Interest 1 0 0  Down, Depressed, Hopeless 1 0 0  PHQ - 2 Score 2 0 0  Altered sleeping 3 3 -  Tired, decreased energy 3 1 -  Change in appetite 0 0 -  Feeling bad or failure about yourself  0 0 -  Trouble concentrating 0 0 -  Moving slowly or fidgety/restless 1 0 -  Suicidal thoughts 1 0 -  PHQ-9 Score 10 4 -  Difficult doing work/chores Somewhat difficult Not difficult at all -   Social History   Tobacco Use  Smoking Status Former   Packs/day: 0.50   Years: 5.00   Pack years: 2.50   Types: Cigarettes   Quit date: 04/24/2004   Years since quitting: 16.9  Smokeless Tobacco Never   BP Readings from Last 3 Encounters:  04/10/21 132/76  04/06/21 (!) 154/99  03/23/21 135/78   Pulse Readings from Last 3 Encounters:  04/10/21 88  04/06/21 (!) 101  03/23/21 96   Wt Readings from Last 3 Encounters:  04/09/21 170 lb (77.1 kg)  04/06/21 170 lb (77.1 kg)  03/23/21 165 lb (74.8 kg)   BMI Readings from Last 3 Encounters:  04/09/21 28.29 kg/m  04/06/21 27.44 kg/m  03/23/21 26.63 kg/m    Assessment/Interventions: Review of patient past medical history, allergies, medications, health status, including review of consultants reports, laboratory and other test data, was performed as part of comprehensive evaluation and provision of chronic care management  services.   SDOH:  (Social Determinants of Health) assessments and interventions performed: Yes SDOH Interventions    Flowsheet Row Most Recent Value  SDOH Interventions   Financial Strain Interventions Intervention Not Indicated      SDOH Screenings   Alcohol Screen: Not on file  Depression (PHQ2-9): Medium Risk   PHQ-2 Score: 10  Financial Resource Strain: Low Risk    Difficulty of Paying Living Expenses: Not hard at all  Food Insecurity: Not on file  Housing: Not on file  Physical Activity: Not on file  Social Connections: Not on file  Stress: Not on file  Tobacco Use: Medium Risk   Smoking Tobacco Use: Former   Smokeless Tobacco Use: Never   Passive Exposure: Not on file  Transportation Needs: Not on file    [place]  Allergies  Allergen Reactions   Baclofen Other (See Comments)  Codeine Nausea Only   Plavix [Clopidogrel]     brusing all over   Sulfa Antibiotics Nausea Only   Latex Rash    Medications Reviewed Today     Reviewed by Randal Buba, CMA (Certified Medical Assistant) on 04/06/21 at 1325  Med List Status: <None>   Medication Order Taking? Sig Documenting Provider Last Dose Status Informant  acetaminophen (TYLENOL) 500 MG tablet [id number] Yes Take 500 mg by mouth every 8 (eight) hours as needed for mild pain or moderate pain. [provider] Taking Active Self           Med Note Rosemarie Beath, MELISSA B   Wed Feb 20, 2017  1:14 PM)    albuterol (VENTOLIN HFA) 108 (90 Base) MCG/ACT inhaler [id number] Yes Inhale 2 puffs into the lungs every 6 (six) hours as needed for wheezing or shortness of breath. Lavera Guise, MD Taking Active   amLODipine (NORVASC) 10 MG tablet [id number] Yes Take 10 mg by mouth daily. [provider] Taking Active   Ascorbic Acid (VITAMIN C) 1000 MG tablet [id number] Yes Take 1,000 mg by mouth daily. [provider] Taking Active Self  aspirin 81 MG tablet [id number] Yes Take 81 mg by mouth daily.   [provider] Taking Active Self           Med Note Rosemarie Beath, MELISSA B   Wed Feb 20, 2017  1:13 PM)    azelastine (ASTELIN) 0.1 % nasal spray [id number] Yes Place 1 spray into both nostrils daily as needed. [provider] Taking Active   budesonide-formoterol (SYMBICORT) 160-4.5 MCG/ACT inhaler [id number] Yes TAKE 1 PUFF BY MOUTH TWICE A DAY Allyne Gee, MD Taking Active   Calcium Carb-Cholecalciferol (CALCIUM 600+D3 PO) [id number] Yes Take 1 tablet by mouth daily. [provider] Taking Active Self  carboxymethylcellulose (REFRESH PLUS) 0.5 % SOLN [id number] Yes 1 drop 3 (three) times daily as needed. [provider] Taking Active   Coenzyme Q10 (COQ-10) 100 MG capsule [id number] Yes Take 1 capsule (100 mg total) by mouth daily. [name], FNP Taking Active   docusate sodium (COLACE) 100 MG capsule [id number] Yes Take 200 mg by mouth at bedtime. [provider] Taking Active   hydrochlorothiazide (HYDRODIURIL) 12.5 MG tablet [id number] Yes Take 12.5 mg by mouth daily. [provider] Taking Active   metoprolol tartrate (LOPRESSOR) 25 MG tablet [id number] Yes Take 1 tablet (25 mg total) by mouth 2 (two) times daily. [name], FNP Taking Active   Multiple Vitamin (MULTI-VITAMIN DAILY PO) [id number] Yes Take by mouth. [provider] Taking Active   pantoprazole (PROTONIX) 40 MG tablet [id number] Yes TAKE 1 TABLET (40 MG TOTAL) BY MOUTH ONCE DAILY TAKE 30 MINUTES BEFORE BREAKFAST. [name], FNP Taking Active   rosuvastatin (CRESTOR) 20 MG tablet [id number] Yes Take 1 tablet (20 mg total) by mouth daily. [name], FNP Taking Active   tobramycin (TOBREX) 0.3 % ophthalmic solution [id number] Yes  [provider] Taking Active             Patient Active Problem List   Diagnosis Date Noted   Pneumonia 04/09/2021   Severe mitral regurgitation by prior echocardiogram    Atrial fibrillation (HCC)    Pleural  effusion    Primary hypertension 11/24/2020   Need for influenza vaccination 11/03/2020   Hiatal hernia with gastroesophageal reflux disease without esophagitis 11/03/2020   Atherosclerosis of both carotid arteries 03/18/2020   Memory loss 03/18/2020  Hand arthritis 03/18/2020   Neck pain 03/18/2020   Aortic atherosclerosis ([place]) 10/26/2016   Claudication ([place]) 10/26/2016   COPD with acute exacerbation ([place]) 10/22/2016   OSA on CPAP 09/16/2015   Abnormal chest x-ray 01/25/2015   Atrophic vaginitis 01/25/2015   Coronary artery disease 01/25/2015   Stage 3a chronic kidney disease ([place]) 01/25/2015   Bloodgood disease 01/25/2015   Blood glucose elevated 01/25/2015   Hyperlipidemia 01/25/2015   Cannot sleep 01/25/2015   Hypertension 11/02/2014   Acid reflux 11/02/2014   Arthritis 10/19/2014   Adenomatous colon polyp 07/27/2013   Gastric catarrh 07/27/2013   Bergmann's syndrome 07/27/2013   Barrett esophagus 07/07/2013   Osteopenia 12/13/2009   PAD (peripheral artery disease) ([place]) 09/24/2007   DD (diverticular disease) 12/19/2005    Immunization History  Administered Date(s) Administered   Fluad Quad(high Dose 65+) 10/20/2018, 10/21/2019, 11/03/2020   Influenza Split 12/01/2005, 11/17/2007   Influenza, High Dose Seasonal PF 10/25/2016, 10/21/2019   Influenza-Unspecified 10/06/2013, 10/07/2015, 10/25/2016, 10/28/2017   PFIZER(Purple Top)SARS-COV-2 Vaccination 02/27/2019, 03/20/2019, 11/09/2019   Pneumococcal Conjugate-13 12/28/2013   Pneumococcal Polysaccharide-23 05/31/2003   Zoster, Live 03/17/2008    Conditions to be addressed/monitored:  Hypertension, Hyperlipidemia, GERD, COPD, Chronic Kidney Disease, and Osteopenia  Care Plan : General Pharmacy (Adult)  Updates made by Germaine Pomfret, RPH since 04/10/2021 12:00 AM     Problem: Hypertension, Hyperlipidemia, GERD, COPD, Chronic Kidney Disease, and Osteopenia   Priority: High     Long-Range Goal:  Patient-Specific Goal   Start Date: 04/10/2021  Expected End Date: 04/11/2022  This Visit's Progress: On track  Priority: High  Note:   Current Barriers:  Unable to self administer medications as prescribed  Pharmacist Clinical Goal(s):  Patient will maintain control of blood pressure as evidenced by BP less than 140/90  through collaboration with PharmD and provider.   Interventions: 1:1 collaboration with [name], FNP regarding development and update of comprehensive plan of care as evidenced by provider attestation and co-signature Inter-disciplinary care team collaboration (see longitudinal plan of care) Comprehensive medication review performed; medication list updated in electronic medical record  Hypertension (BP goal <140/90) -Controlled -Current treatment: Amlodipine 10 mg daily: Appropriate, Effective, Safe, Accessible HCTZ 12.5 mg daily: Appropriate, Effective, Safe, Accessible  Metoprolol tartrate 50 mg twice daily: Appropriate, Effective, Safe, Accessible  -Medications previously tried: NA  -Current home readings:   131/74; Pulse 70 139/60; Pulse 81 141/79; Pulse 76 -Denies hypotensive/hypertensive symptoms -Reports some swelling in legs and ankles. Compression stockings help.  -Recommended to continue current medication  Hyperlipidemia: (LDL goal < 70) -Uncontrolled -History of PAD, PVD, CAD s/p CABG x 3 (2006)  -Current treatment: Rosuvastatin 20 mg daily: Appropriate, Effective, Safe, Accessible  -Current antiplatelet treatment: Aspirin 81 mg daily: Appropriate, Effective, Safe, Accessible  -Medications previously tried: Simvastatin  -Recommended to continue current medication  COPD (Goal: control symptoms and prevent exacerbations) -Controlled -Current treatment  ProAir HFA 2 puffs every 6 hours as needed: Appropriate, Effective, Safe, Accessible  Symbicort 1 puff twice daily : Appropriate, Query Effective -Medications previously tried: NA  -Patient  denies consistent use of maintenance inhaler -Frequency of rescue inhaler use: 1-2x weekly  -Recommended to continue current medication  Osteoarthritis (Goal: Prevent inflammation) -Controlled -Current treatment  Acetaminophen 500 mg every 8 hours as needed -Medications previously tried: NA  -Recommended to continue current medication   Patient Goals/Self-Care Activities Patient will:  - check blood pressure weekly, document, and provide at future appointments  Follow Up Plan: Telephone follow  up appointment with care management team member scheduled for:  06/30/2021 at 8:30 AM      Medication Assistance: None required.  Patient affirms current coverage meets needs.  Compliance/Adherence/Medication fill history: Care Gaps: Shingrix Vaccine Dexa Scan COVID-19 Vaccine  Star-Rating Drugs: Rosuvastatin 20 mg last filled 01/21/2021 [facility] day [facility].  Patient's preferred pharmacy is:  [id number] [place], [place] [address] [id number]: [phone]: [phone] Uses pill box? Yes Pt endorses 100% compliance  We discussed: Current pharmacy is preferred with insurance plan and patient is satisfied with pharmacy services Patient decided to: Continue current medication management strategy  Care Plan and Follow Up Patient Decision:  Patient agrees to Care Plan and Follow-up.  Plan: Telephone follow up appointment with care management team member scheduled for:  06/30/2021 at 8:30 AM  AJunius Argyle PharmD, BPara [phone]

## 2021-04-04 ENCOUNTER — Ambulatory Visit: Payer: Self-pay | Admitting: *Deleted

## 2021-04-04 DIAGNOSIS — I10 Essential (primary) hypertension: Secondary | ICD-10-CM | POA: Diagnosis not present

## 2021-04-04 NOTE — Telephone Encounter (Signed)
° °  Chief Complaint: SOB, swelling since Norvasc started in Jan Symptoms: SOB swelling Frequency: usually Pertinent Negatives: Patient denies fever Disposition: [] ED /[] Urgent Care (no appt availability in office) / [x] Appointment(In office/virtual)/ []  [facility]/ [] Home Care/ [] Refused Recommended Disposition /[] [facility]/ []  Follow-up with PCP Additional Notes: Appt this week.  Reason for Disposition  [1] MODERATE longstanding difficulty breathing (e.g., speaks in phrases, SOB even at rest, pulse 100-120) AND [2] SAME as normal  Answer Assessment - Initial Assessment Questions 1. RESPIRATORY STATUS: "Describe your breathing?" (e.g., wheezing, shortness of breath, unable to speak, severe coughing) Wheezing, feeling SOB 2. ONSET: "When did this breathing problem begin?" January 3. PATTERN "Does the difficult breathing come and go, or has it been constant since it started?" always 4. SEVERITY: "How bad is your breathing?" (e.g., mild, moderate, severe)      - SEVERE: Very SOB at rest, speaks in single words, struggling to breathe, sitting hunched forward, retractions, pulse > 120 NO 5. LUNG HISTORY: "Do you have any history of lung disease?"  (e.g., pulmonary embolus, asthma, emphysema) YES 6. OTHER SYMPTOMS: "Do you have any other symptoms? (e.g., dizziness, runny nose, cough, chest pain, fever) coughing occassionally  Protocols used: Breathing Difficulty-A-AH

## 2021-04-04 NOTE — Telephone Encounter (Signed)
Please review

## 2021-04-06 ENCOUNTER — Ambulatory Visit (INDEPENDENT_AMBULATORY_CARE_PROVIDER_SITE_OTHER): Payer: Medicare Other | Admitting: Physician Assistant

## 2021-04-06 ENCOUNTER — Encounter: Payer: Self-pay | Admitting: Physician Assistant

## 2021-04-06 ENCOUNTER — Other Ambulatory Visit: Payer: Self-pay

## 2021-04-06 VITALS — BP 154/99 | HR 101 | Temp 98.7°F | Resp 20 | Wt 170.0 lb

## 2021-04-06 DIAGNOSIS — E785 Hyperlipidemia, unspecified: Secondary | ICD-10-CM

## 2021-04-06 DIAGNOSIS — I4891 Unspecified atrial fibrillation: Secondary | ICD-10-CM

## 2021-04-06 DIAGNOSIS — I10 Essential (primary) hypertension: Secondary | ICD-10-CM

## 2021-04-06 DIAGNOSIS — R0600 Dyspnea, unspecified: Secondary | ICD-10-CM | POA: Diagnosis not present

## 2021-04-06 NOTE — Progress Notes (Signed)
?  ? ?I,Roshena L Chambers,acting as a Education administrator for [employer], PA-C.,have documented all relevant documentation on the behalf of [name], PA-C,as directed by  [employer], PA-C while in the presence of [employer], PA-C.  ? ?Established patient visit ? ? ?Patient: Christina [name]   DOB: 11-04-1935   86 y.o. Female  MRN: [id number] ?Visit Date: 04/06/2021 ? ?Today's healthcare provider: [name], PA-C  ? ?Chief Complaint  ?Patient presents with  ? Shortness of Breath  ? ?Subjective  ?  ? ?Shortness of Breath ?This is a chronic problem. She is here today for follow up of her chronic disease management. The current episode started more than 2 months ago (since starting Amlodipine). Associated symptoms include leg swelling and wheezing (at night). Pertinent negatives include no abdominal pain, chest pain, fever or vomiting. Her past medical history is significant for chronic lung disease.  ?Patient states that she is doing well on the current medication regimen.She stopped taking Amlodipine. Currently, taking Metoprolol twice daily and HCTZ for HTN. ?Her leg swelling improved considerably. Her last appt with Cardiology was on 02/15/21. She was recommended to take metoprolol, amlodipine, HCTZ and add imdur if necessary for dyspnea on exertion, to continue taking Symbicort trilogy and follow up with pulmonology, to do sleep studies and CPAP,  to continue with weight loss, Protonix for GERD and return for a regular 33mo follow-up. Patient states that she was compliant with her medications. Has a regular monthly meetings with pharmacy coordinator. ?Patient denies having heartburn, epigastric pain, problems with swallowing, changes in bowel movements, changes in urinary frequency. ?Per patient, she is scheduled for appts with Cardiology, Pulmonology  ? ? ?Medications: ?Outpatient Medications Prior to Visit  ?Medication Sig  ? acetaminophen (TYLENOL) 500 MG tablet Take 500 mg by mouth every 8 (eight) hours  as needed for mild pain or moderate pain.  ? albuterol (VENTOLIN HFA) 108 (90 Base) MCG/ACT inhaler Inhale 2 puffs into the lungs every 6 (six) hours as needed for wheezing or shortness of breath.  ? amLODipine (NORVASC) 10 MG tablet Take 10 mg by mouth daily.  ? Ascorbic Acid (VITAMIN C) 1000 MG tablet Take 1,000 mg by mouth daily.  ? aspirin 81 MG tablet Take 81 mg by mouth daily.   ? azelastine (ASTELIN) 0.1 % nasal spray Place 1 spray into both nostrils daily as needed.  ? budesonide-formoterol (SYMBICORT) 160-4.5 MCG/ACT inhaler TAKE 1 PUFF BY MOUTH TWICE A DAY  ? Calcium Carb-Cholecalciferol (CALCIUM 600+D3 PO) Take 1 tablet by mouth daily.  ? carboxymethylcellulose (REFRESH PLUS) 0.5 % SOLN 1 drop 3 (three) times daily as needed.  ? Coenzyme Q10 (COQ-10) 100 MG capsule Take 1 capsule (100 mg total) by mouth daily.  ? docusate sodium (COLACE) 100 MG capsule Take 200 mg by mouth at bedtime.  ? hydrochlorothiazide (HYDRODIURIL) 12.5 MG tablet Take 12.5 mg by mouth daily.  ? metoprolol tartrate (LOPRESSOR) 25 MG tablet Take 1 tablet (25 mg total) by mouth 2 (two) times daily.  ? Multiple Vitamin (MULTI-VITAMIN DAILY PO) Take by mouth.  ? pantoprazole (PROTONIX) 40 MG tablet TAKE 1 TABLET (40 MG TOTAL) BY MOUTH ONCE DAILY TAKE 30 MINUTES BEFORE BREAKFAST.  ? rosuvastatin (CRESTOR) 20 MG tablet Take 1 tablet (20 mg total) by mouth daily.  ? tobramycin (TOBREX) 0.3 % ophthalmic solution   ? ?No facility-administered medications prior to visit.  ? ? ?Review of Systems  ?Constitutional:  Positive for fatigue. Negative for appetite change, chills and fever.  ?  Respiratory:  Positive for cough (chronic, hx of COPD, Former (Quit Date: 04/24/2004), 0.5 ppd, 2.5 pack-years), shortness of breath and wheezing (at night). Negative for chest tightness.   ?Cardiovascular:  Positive for leg swelling. Negative for chest pain and palpitations.  ?Gastrointestinal:  Negative for abdominal pain, nausea and vomiting.  ?Genitourinary:   Positive for enuresis.  ?Musculoskeletal:  Positive for joint swelling.  ?Neurological:  Positive for dizziness. Negative for weakness.  ? ?  Objective  ?  ?BP (!) 154/99 (BP Location: Left Arm, Patient Position: Sitting, Cuff Size: Large)   Pulse (!) 101   Temp 98.7 ?F (37.1 ?C) (Oral)   Resp 20   Wt 170 lb (77.1 kg)   SpO2 96% Comment: room air  BMI 27.44 kg/m?  ? ? ?Physical Exam ?Vitals and nursing note reviewed.  ?Constitutional:   ?   General: She is not in acute distress. ?   Appearance: She is well-developed.  ?HENT:  ?   Head: Normocephalic and atraumatic.  ?   Mouth/Throat:  ?   Mouth: Mucous membranes are moist.  ?   Pharynx: Oropharynx is clear.  ?Cardiovascular:  ?   Rate and Rhythm: Rhythm irregular.  ?Pulmonary:  ?   Effort: Pulmonary effort is normal.  ?   Comments: Soft lung sounds on auscultation ?Chest:  ?   Chest wall: No tenderness or crepitus.  ?Musculoskeletal:  ?   Cervical back: Normal range of motion and neck supple.  ?Neurological:  ?   Mental Status: She is alert.  ?  ? ? Assessment & Plan  ?  ? ?SOB/Suspected A fib/HTN/HLD ?Vitals are stable. Patient states that she is doing better than a month ago. ?BP today 154/99, not at goal. ?- Continue metoprolol and HCTZ. Patient d/c amlodipine because of leg swelling. ?- Changed metoprolol to 75mg  TID. We discussed changes with patient in length ?We look through patient's medication box and labeled morning, evening and TID medications together. I wrote an instruction on how to take metoprolol 50mg  and 25mg  and bundle her metoprolol medications together. Patient will have a monthly meeting with pharmacy coordinator according to patient. ?- EKG 12-Lead noted for poor R-wave progression secondary to pulmonary disease consider old anterior infarct and low voltage-possible pulmonary disease, suspected AF ?- Patient has a FU appointment with Dr. [name] on 06/15/21.  ?- Ambulatory referral to Cardiology was ordered for the beginning of the next  week. Patient refused to see Cardiology as she states that she feels better and she will have an appointment with her ophthalmology next week for macular degeneration. Also, she states that she will have a scheduled Pulmonology appt .Per chart review, I was not able to find her scheduled appt for pulmonology. ?- Planned to do labs(CMP, CBC, TSH, BNP, TroponinT) before the appt with Cardiology, might need to send for CXR. Last A1c, lipids and TSH on 03/18/20. ?CMP on 06/28/20 with ?- Continue crestor for HLD, symbicort for COPD, protonix for GERD. ?- FU with her PCP. ? ?I consulted with Dr. Rosanna Randy regarding this patient. We discussed that she will need a referral to Cardiology next week with Dr [name]. ?I discussed the assessment and treatment plan with the patient  The patient was provided an opportunity to ask questions and all were answered. The patient agreed with the plan and demonstrated an understanding of the instructions. ?  ?The patient was advised to call back or seek an in-person evaluation if the symptoms worsen or if the condition fails to  improve as anticipated. ?The entirety of the information documented in the History of Present Illness, Review of Systems and Physical Exam were personally obtained by me. Portions of this information were initially documented by the CMA and reviewed by me for thoroughness and accuracy.  ? ? ?[name], PA-C  ?[place] ?[phone] (phone) ?[phone] (fax) ? ?[facility]  ?

## 2021-04-07 ENCOUNTER — Telehealth: Payer: Self-pay

## 2021-04-07 NOTE — Telephone Encounter (Signed)
Copied from [place] [id number]. Topic: Referral - Status ?>> Apr 07, 2021  9:37 AM McGill, Nelva Bush wrote: ?Reason for CRM: Pt is calling to follow up on the referral to cardiology. Pt stated she reached out to Dr. Lujean Amel and was advised they have not received anything from [facility] and suggested pt contact PCP. ? ?Pt requesting a call back. ?

## 2021-04-07 NOTE — Telephone Encounter (Signed)
Referral was sent to cardio today at 12:59pm. Patient advised.  ?

## 2021-04-09 ENCOUNTER — Other Ambulatory Visit: Payer: Self-pay

## 2021-04-09 ENCOUNTER — Emergency Department: Payer: Medicare Other

## 2021-04-09 ENCOUNTER — Inpatient Hospital Stay
Admission: EM | Admit: 2021-04-09 | Discharge: 2021-04-11 | DRG: 194 | Disposition: A | Discharge status: Home / Self Care | Payer: Medicare Other | Attending: Internal Medicine | Admitting: Internal Medicine

## 2021-04-09 DIAGNOSIS — J90 Pleural effusion, not elsewhere classified: Secondary | ICD-10-CM | POA: Diagnosis not present

## 2021-04-09 DIAGNOSIS — I480 Paroxysmal atrial fibrillation: Secondary | ICD-10-CM | POA: Diagnosis not present

## 2021-04-09 DIAGNOSIS — Z7982 Long term (current) use of aspirin: Secondary | ICD-10-CM

## 2021-04-09 DIAGNOSIS — I4891 Unspecified atrial fibrillation: Secondary | ICD-10-CM | POA: Diagnosis not present

## 2021-04-09 DIAGNOSIS — K449 Diaphragmatic hernia without obstruction or gangrene: Secondary | ICD-10-CM

## 2021-04-09 DIAGNOSIS — J441 Chronic obstructive pulmonary disease with (acute) exacerbation: Principal | ICD-10-CM | POA: Diagnosis present

## 2021-04-09 DIAGNOSIS — Z820 Family history of epilepsy and other diseases of the nervous system: Secondary | ICD-10-CM

## 2021-04-09 DIAGNOSIS — T380X5A Adverse effect of glucocorticoids and synthetic analogues, initial encounter: Secondary | ICD-10-CM | POA: Diagnosis not present

## 2021-04-09 DIAGNOSIS — G4733 Obstructive sleep apnea (adult) (pediatric): Secondary | ICD-10-CM | POA: Diagnosis not present

## 2021-04-09 DIAGNOSIS — Y92239 Unspecified place in hospital as the place of occurrence of the external cause: Secondary | ICD-10-CM | POA: Diagnosis not present

## 2021-04-09 DIAGNOSIS — J440 Chronic obstructive pulmonary disease with acute lower respiratory infection: Secondary | ICD-10-CM | POA: Diagnosis present

## 2021-04-09 DIAGNOSIS — R7301 Impaired fasting glucose: Secondary | ICD-10-CM | POA: Diagnosis not present

## 2021-04-09 DIAGNOSIS — I130 Hypertensive heart and chronic kidney disease with heart failure and stage 1 through stage 4 chronic kidney disease, or unspecified chronic kidney disease: Secondary | ICD-10-CM | POA: Diagnosis present

## 2021-04-09 DIAGNOSIS — Z951 Presence of aortocoronary bypass graft: Secondary | ICD-10-CM | POA: Diagnosis not present

## 2021-04-09 DIAGNOSIS — E785 Hyperlipidemia, unspecified: Secondary | ICD-10-CM | POA: Diagnosis not present

## 2021-04-09 DIAGNOSIS — I081 Rheumatic disorders of both mitral and tricuspid valves: Secondary | ICD-10-CM | POA: Diagnosis present

## 2021-04-09 DIAGNOSIS — Z66 Do not resuscitate: Secondary | ICD-10-CM | POA: Diagnosis not present

## 2021-04-09 DIAGNOSIS — J449 Chronic obstructive pulmonary disease, unspecified: Secondary | ICD-10-CM

## 2021-04-09 DIAGNOSIS — I739 Peripheral vascular disease, unspecified: Secondary | ICD-10-CM | POA: Diagnosis not present

## 2021-04-09 DIAGNOSIS — Z20822 Contact with and (suspected) exposure to covid-19: Secondary | ICD-10-CM | POA: Diagnosis not present

## 2021-04-09 DIAGNOSIS — I25810 Atherosclerosis of coronary artery bypass graft(s) without angina pectoris: Secondary | ICD-10-CM | POA: Diagnosis not present

## 2021-04-09 DIAGNOSIS — J189 Pneumonia, unspecified organism: Principal | ICD-10-CM | POA: Diagnosis present

## 2021-04-09 DIAGNOSIS — Z91040 Latex allergy status: Secondary | ICD-10-CM | POA: Diagnosis not present

## 2021-04-09 DIAGNOSIS — Z881 Allergy status to other antibiotic agents status: Secondary | ICD-10-CM

## 2021-04-09 DIAGNOSIS — N1831 Chronic kidney disease, stage 3a: Secondary | ICD-10-CM | POA: Diagnosis not present

## 2021-04-09 DIAGNOSIS — Z87891 Personal history of nicotine dependence: Secondary | ICD-10-CM

## 2021-04-09 DIAGNOSIS — Z882 Allergy status to sulfonamides status: Secondary | ICD-10-CM

## 2021-04-09 DIAGNOSIS — I361 Nonrheumatic tricuspid (valve) insufficiency: Secondary | ICD-10-CM | POA: Diagnosis not present

## 2021-04-09 DIAGNOSIS — Z833 Family history of diabetes mellitus: Secondary | ICD-10-CM

## 2021-04-09 DIAGNOSIS — I10 Essential (primary) hypertension: Secondary | ICD-10-CM | POA: Diagnosis not present

## 2021-04-09 DIAGNOSIS — Z83438 Family history of other disorder of lipoprotein metabolism and other lipidemia: Secondary | ICD-10-CM

## 2021-04-09 DIAGNOSIS — K219 Gastro-esophageal reflux disease without esophagitis: Secondary | ICD-10-CM | POA: Diagnosis not present

## 2021-04-09 DIAGNOSIS — Z888 Allergy status to other drugs, medicaments and biological substances status: Secondary | ICD-10-CM | POA: Diagnosis not present

## 2021-04-09 DIAGNOSIS — I2510 Atherosclerotic heart disease of native coronary artery without angina pectoris: Secondary | ICD-10-CM | POA: Diagnosis not present

## 2021-04-09 DIAGNOSIS — J209 Acute bronchitis, unspecified: Secondary | ICD-10-CM

## 2021-04-09 DIAGNOSIS — Z90710 Acquired absence of both cervix and uterus: Secondary | ICD-10-CM

## 2021-04-09 DIAGNOSIS — Z8249 Family history of ischemic heart disease and other diseases of the circulatory system: Secondary | ICD-10-CM

## 2021-04-09 DIAGNOSIS — R0602 Shortness of breath: Secondary | ICD-10-CM

## 2021-04-09 DIAGNOSIS — Z79890 Hormone replacement therapy (postmenopausal): Secondary | ICD-10-CM

## 2021-04-09 DIAGNOSIS — Z885 Allergy status to narcotic agent status: Secondary | ICD-10-CM

## 2021-04-09 DIAGNOSIS — I5031 Acute diastolic (congestive) heart failure: Secondary | ICD-10-CM | POA: Diagnosis not present

## 2021-04-09 DIAGNOSIS — I340 Nonrheumatic mitral (valve) insufficiency: Secondary | ICD-10-CM | POA: Diagnosis not present

## 2021-04-09 DIAGNOSIS — Z79899 Other long term (current) drug therapy: Secondary | ICD-10-CM

## 2021-04-09 LAB — RESP PANEL BY RT-PCR (FLU A&B, COVID) ARPGX2
Influenza A by PCR: NEGATIVE
Influenza B by PCR: NEGATIVE
SARS Coronavirus 2 by RT PCR: NEGATIVE

## 2021-04-09 LAB — BASIC METABOLIC PANEL
Anion gap: 8 (ref 5–15)
BUN: 31 mg/dL — ABNORMAL HIGH (ref 8–23)
CO2: 24 mmol/L (ref 22–32)
Calcium: 8.1 mg/dL — ABNORMAL LOW (ref 8.9–10.3)
Chloride: 105 mmol/L (ref 98–111)
Creatinine, Ser: 1.23 mg/dL — ABNORMAL HIGH (ref 0.44–1.00)
GFR, Estimated: 44 mL/min — ABNORMAL LOW (ref >60)
Glucose, Bld: 136 mg/dL — ABNORMAL HIGH (ref 70–99)
Potassium: 3.9 mmol/L (ref 3.5–5.1)
Sodium: 137 mmol/L (ref 135–145)

## 2021-04-09 LAB — CBC
HCT: 38.4 % (ref 36.0–46.0)
Hemoglobin: 12.3 g/dL (ref 12.0–15.0)
MCH: 33.8 pg (ref 26.0–34.0)
MCHC: 32 g/dL (ref 30.0–36.0)
MCV: 105.5 fL — ABNORMAL HIGH (ref 80.0–100.0)
Platelets: 189 10*3/uL (ref 150–400)
RBC: 3.64 MIL/uL — ABNORMAL LOW (ref 3.87–5.11)
RDW: 13.4 % (ref 11.5–15.5)
WBC: 7.1 10*3/uL (ref 4.0–10.5)
nRBC: 0 % (ref 0.0–0.2)

## 2021-04-09 LAB — TROPONIN I (HIGH SENSITIVITY)
Troponin I (High Sensitivity): 7 ng/L (ref <18)
Troponin I (High Sensitivity): 8 ng/L (ref <18)

## 2021-04-09 MED ORDER — IPRATROPIUM-ALBUTEROL 0.5-2.5 (3) MG/3ML IN SOLN
3.0000 mL | Freq: Once | RESPIRATORY_TRACT | Status: AC
Start: 2021-04-09 — End: 2021-04-09
  Administered 2021-04-09: 3 mL via RESPIRATORY_TRACT
  Filled 2021-04-09: qty 3

## 2021-04-09 MED ORDER — CEFTRIAXONE SODIUM 1 G IJ SOLR
1.0000 g | Freq: Once | INTRAMUSCULAR | Status: AC
  Administered 2021-04-09: 1 g via INTRAVENOUS
  Filled 2021-04-09: qty 10

## 2021-04-09 MED ORDER — ONDANSETRON HCL 4 MG/2ML IJ SOLN: 4.0000 mg | Freq: Four times a day (QID) | INTRAMUSCULAR | Status: DC | PRN

## 2021-04-09 MED ORDER — IPRATROPIUM-ALBUTEROL 0.5-2.5 (3) MG/3ML IN SOLN
3.0000 mL | Freq: Once | RESPIRATORY_TRACT | Status: AC
  Administered 2021-04-09: 3 mL via RESPIRATORY_TRACT
  Filled 2021-04-09: qty 3

## 2021-04-09 MED ORDER — ENOXAPARIN SODIUM 40 MG/0.4ML IJ SOSY
40.0000 mg | PREFILLED_SYRINGE | INTRAMUSCULAR | Status: DC
  Administered 2021-04-10: 40 mg via SUBCUTANEOUS
  Filled 2021-04-09: qty 0.4

## 2021-04-09 MED ORDER — SODIUM CHLORIDE 0.9 % IV SOLN
1.0000 g | Freq: Once | INTRAVENOUS | Status: AC
  Administered 2021-04-10: 1 g via INTRAVENOUS
  Filled 2021-04-09: qty 10

## 2021-04-09 MED ORDER — SODIUM CHLORIDE 0.9 % IV SOLN: 500.0000 mg | INTRAVENOUS | Status: DC

## 2021-04-09 MED ORDER — ALBUTEROL SULFATE (2.5 MG/3ML) 0.083% IN NEBU: 2.5000 mg | INHALATION_SOLUTION | RESPIRATORY_TRACT | Status: DC | PRN

## 2021-04-09 MED ORDER — METOPROLOL TARTRATE 25 MG PO TABS
25.0000 mg | ORAL_TABLET | Freq: Two times a day (BID) | ORAL | Status: DC
  Administered 2021-04-10: 25 mg via ORAL
  Filled 2021-04-09: qty 1

## 2021-04-09 MED ORDER — ACETAMINOPHEN 650 MG RE SUPP: 650.0000 mg | Freq: Four times a day (QID) | RECTAL | Status: DC | PRN

## 2021-04-09 MED ORDER — METHYLPREDNISOLONE SODIUM SUCC 125 MG IJ SOLR
125.0000 mg | Freq: Once | INTRAMUSCULAR | Status: AC
  Administered 2021-04-09: 125 mg via INTRAVENOUS
  Filled 2021-04-09: qty 2

## 2021-04-09 MED ORDER — SODIUM CHLORIDE 0.9 % IV SOLN
2.0000 g | INTRAVENOUS | Status: DC
  Administered 2021-04-10: 2 g via INTRAVENOUS
  Filled 2021-04-09 (×2): qty 20

## 2021-04-09 MED ORDER — PREDNISONE 20 MG PO TABS
40.0000 mg | ORAL_TABLET | Freq: Every day | ORAL | Status: DC
  Administered 2021-04-11: 40 mg via ORAL
  Filled 2021-04-09: qty 2

## 2021-04-09 MED ORDER — METHYLPREDNISOLONE SODIUM SUCC 40 MG IJ SOLR
40.0000 mg | Freq: Two times a day (BID) | INTRAMUSCULAR | Status: AC
  Administered 2021-04-10 (×2): 40 mg via INTRAVENOUS
  Filled 2021-04-09 (×2): qty 1

## 2021-04-09 MED ORDER — SODIUM CHLORIDE 0.9 % IV SOLN
500.0000 mg | Freq: Once | INTRAVENOUS | Status: AC
  Administered 2021-04-09: 500 mg via INTRAVENOUS
  Filled 2021-04-09: qty 5

## 2021-04-09 MED ORDER — ROSUVASTATIN CALCIUM 10 MG PO TABS
20.0000 mg | ORAL_TABLET | Freq: Every evening | ORAL | Status: DC
  Administered 2021-04-10: 20 mg via ORAL
  Filled 2021-04-09: qty 2
  Filled 2021-04-09: qty 1

## 2021-04-09 MED ORDER — ONDANSETRON HCL 4 MG PO TABS: 4.0000 mg | ORAL_TABLET | Freq: Four times a day (QID) | ORAL | Status: DC | PRN

## 2021-04-09 MED ORDER — ACETAMINOPHEN 325 MG PO TABS: 650.0000 mg | ORAL_TABLET | Freq: Four times a day (QID) | ORAL | Status: DC | PRN

## 2021-04-09 MED ORDER — IPRATROPIUM-ALBUTEROL 0.5-2.5 (3) MG/3ML IN SOLN
3.0000 mL | Freq: Four times a day (QID) | RESPIRATORY_TRACT | Status: DC
  Administered 2021-04-10 – 2021-04-11 (×2): 3 mL via RESPIRATORY_TRACT
  Filled 2021-04-09 (×3): qty 3

## 2021-04-09 NOTE — Assessment & Plan Note (Addendum)
Creatinine 1.23 on coming in and 1.04 currently GFR still 53 ?

## 2021-04-09 NOTE — Assessment & Plan Note (Addendum)
Increase metoprolol 75 mg twice a day.  Cardiology recommended Eliquis for anticoagulation but the patient refused.  I explained to her that with atrial fibrillation without a blood thinner we have a higher risk of stroke.  She understands the risk.  EF normal with mild to moderate MR and severe TR. ?

## 2021-04-09 NOTE — ED Provider Notes (Signed)
? ?[facility] ?Provider Note ? ? ? Event Date/Time  ? First MD Initiated Contact with Patient 04/09/21 2205   ?  (approximate) ? ? ?History  ? ?Shortness of Breath ? ? ?HPI ? ?Christina [name] is a 86 y.o. female  who, per primary care office note dated 03/23/2021 who was complaining of some fatigue at that time and has history of HTN, COPD, presents to the emergency department today because of concern for shortness of breath.  The patient states that she has been having some issues with feeling bad including the shortness of breath and fatigue since being switched medications a couple of months ago.  Today however after a walk with her husband she felt particularly short of breath.  Where she used to have a dry cough today she noticed that consisted of thick phlegm.  Patient feels tight in her chest.  She also has some discomfort but she thinks it is secondary to the coughing.  Patient denies any fevers.  She does have occasional swelling in her legs but states it is actually been better recently since she has been wearing compression stockings. ? ?  ? ? ?Physical Exam  ? ?Triage Vital Signs: ?ED Triage Vitals [04/09/21 2027]  ?Enc Vitals Group  ?   BP (!) 161/77  ?   Pulse Rate 77  ?   Resp (!) 22  ?   Temp 99 ?F (37.2 ?C)  ?   Temp Source Oral  ?   SpO2 100 %  ?   Weight 170 lb (77.1 kg)  ?   Height '5\' 5"'$  (1.651 m)  ?   Head Circumference   ?   Peak Flow   ?   Pain Score 7  ? ?Most recent vital signs: ?Vitals:  ? 04/09/21 2027  ?BP: (!) 161/77  ?Pulse: 77  ?Resp: (!) 22  ?Temp: 99 ?F (37.2 ?C)  ?SpO2: 100%  ? ? ?General: Awake, no distress.  ?CV:  Good peripheral perfusion. Irregular rhythm. ?Resp:  Normal effort. Some rhonchi noted in lower lungs. ?Abd:  No distention. No tenderness. ?MSK:  No lower extremity edema.  ? ? ?ED Results / Procedures / Treatments  ? ?Labs ?(all labs ordered are listed, but only abnormal results are displayed) ?Labs Reviewed  ?BASIC METABOLIC PANEL - Abnormal;  Notable for the following components:  ?    Result Value  ? Glucose, Bld 136 (*)   ? BUN 31 (*)   ? Creatinine, Ser 1.23 (*)   ? Calcium 8.1 (*)   ? GFR, Estimated 44 (*)   ? All other components within normal limits  ?CBC - Abnormal; Notable for the following components:  ? RBC 3.64 (*)   ? MCV 105.5 (*)   ? All other components within normal limits  ?RESP PANEL BY RT-PCR (FLU A&B, COVID) [id number]  ?TROPONIN I (HIGH SENSITIVITY)  ?TROPONIN I (HIGH SENSITIVITY)  ? ? ? ?EKG ? ?INance Pear, attending physician, personally viewed and interpreted this EKG ? ?EKG Time: 2241 ?Rate: 69 ?Rhythm: atrial fibrillation ?Axis: normal ?Intervals: qtc 549 ?QRS: narrow, q waves v1, v2 ?ST changes: no st elevation ?Impression: abnormal ekg ? ?RADIOLOGY ?I independently interpreted and visualized the CXR. My interpretation: Right sided pleural effusion ?Radiology interpretation:  ?IMPRESSION:  ?1. Increasing opacity at the right lung base with right pleural  ?effusion, may be compressive atelectasis related to large hiatal  ?hernia or airspace disease/pneumonia.  ?2. Trace left pleural effusion.  ?  3. Large hiatal hernia.  ?   ? ? ?PROCEDURES: ? ?Critical Care performed: No ? ?Procedures ? ? ?MEDICATIONS ORDERED IN ED: ?Medications - No data to display ? ? ?IMPRESSION / MDM / ASSESSMENT AND PLAN / ED COURSE  ?I reviewed the triage vital signs and the nursing notes. ?             ?               ? ?Differential diagnosis includes, but is not limited to, COPD, pneumonia, CHF. ? ?Patient presented to the emergency department today with concerns for shortness of breath.  Did acutely get worse today with change in cough.  Chest x-ray here is concerning for possible pneumonia.  Patient's blood work without any concerning anemia or electrolyte abnormality.  Given concern for pneumonia will plan on starting IV antibiotics.  Additionally will treat for possible COPD exacerbation as well.  Discussed with Dr. Damita Dunnings with the hospitalist who  will plan on admission.  Discussed findings and plan with patient. ? ? ?FINAL CLINICAL IMPRESSION(S) / ED DIAGNOSES  ? ?Final diagnoses:  ?COPD exacerbation ([place])  ?SOB (shortness of breath)  ? ? ? ?Note:  This document was prepared using Dragon voice recognition software and may include unintentional dictation errors. ? ?  ?Nance Pear, MD ?04/09/21 2316 ? ?

## 2021-04-09 NOTE — Assessment & Plan Note (Deleted)
Patient has mild chest discomfort with coughing, mostly atypical.  EKG nonacute and troponin negative we will continue to trend ?Continue aspirin, metoprolol, rosuvastatin ?

## 2021-04-09 NOTE — ED Triage Notes (Addendum)
Pt states has felt shob. Pt states she had a history of copd. Pt with shob noted during exertion in triage. Pt states she also has a headache. Pt states has been having swelling in her legs as well.  ?

## 2021-04-09 NOTE — Assessment & Plan Note (Addendum)
Possible pneumonia.  Patient started on Rocephin and Zithromax.  Procalcitonin negative.  Will complete a quick course of Omnicef and Zithromax upon disposition home.  White blood cell count normal. ?

## 2021-04-09 NOTE — Assessment & Plan Note (Addendum)
Continue aspirin and rosuvastatin ?

## 2021-04-09 NOTE — Assessment & Plan Note (Deleted)
Patient has chronic dyspnea ?We will get echocardiogram.  Last one was in 2019 ?

## 2021-04-09 NOTE — Assessment & Plan Note (Addendum)
Patient was given Solu-Medrol during the hospital course and switch over to prednisone.  I will give prednisone for couple more days upon disposition. ? ?

## 2021-04-09 NOTE — ED Notes (Signed)
Patient positioned for comfort after completing her breathing treatments and ambulating to the bathroom. RR even and unlabored. Patient given tissue to cough phlegm into and urinal to put tissue into. Patient provided with ice chips until diet is ordered. Patient's husband has left for the evening. ?

## 2021-04-09 NOTE — Assessment & Plan Note (Deleted)
Possibly related to pneumonia, possibly CHF, A-fib on EKG ?Patient with chronic dyspnea, lower extremity edema ?Echocardiogram ?

## 2021-04-09 NOTE — H&P (Signed)
History and Physical    Patient: Christina [name]:[id number] DOB: 11-20-1935 DOA: 04/09/2021 DOS: the patient was seen and examined on 04/09/2021 PCP: [name], FNP  Patient coming from: Home  Chief Complaint:  Chief Complaint  Patient presents with   Shortness of Breath    HPI: Christina [name] is a 86 y.o. female with medical history significant for CAD s/p CABG x 3, severe tricuspid regurg on echo 2019, COPD, HTN CKD 3a, PAD, chronic dyspnea who presents to the ED with  shortness of breath beyond her baseline, cough and fatigue which became more apparent after taking a walk with her husband on the day of arrival.    Also has associated chest discomfort when coughing.  She denies fever or chills.  Denies associated contacts.  Reports 1 week of lower extremity edema for which she has been using compression stockings .  Review of past records however reveals longstanding history of peripheral edema.  Patient was evaluated by her cardiologist on 02/2321 because of her dyspnea on exertion and her symptoms were  attributed to COPD and OSA. ED course: On arrival BP 161/77, tachypneic to 22, temp 99, O2 sat of 100% on room air Blood work: Troponin 7, WBC 7.1, hemoglobin 12.3, creatinine 1.23 COVID and flu negative EKG, personally viewed and interpreted: A-fib at 45 with no acute ST-T wave changes Chest x-ray as follows: 1. Increasing opacity at the right lung base with right pleural effusion, may be compressive atelectasis related to large hiatal hernia or airspace disease/pneumonia. 2. Trace left pleural effusion. 3. Large hiatal hernia  Patient treated with DuoNebs, methylprednisolone, ceftriaxone and azithromycin.  Hospitalist consulted for admission.   Review of Systems: As mentioned in the history of present illness. All other systems reviewed and are negative. Past Medical History:  Diagnosis Date   Arthritis    COPD (chronic obstructive pulmonary disease) ([place])     Coronary artery disease    GERD (gastroesophageal reflux disease)    Hyperlipidemia    Hypertension    Macular degeneration    PAD (peripheral artery disease) ([place])    Sleep apnea    Past Surgical History:  Procedure Laterality Date   ABDOMINAL HYSTERECTOMY  1980   due to dysfunctional uterine bleeding   APPENDECTOMY  1980   BREAST SURGERY Left 2000   biopsy   CARDIAC CATHETERIZATION     COLONOSCOPY     CORONARY ARTERY BYPASS GRAFT  2006   HEMORRHOID SURGERY     LOWER EXTREMITY ANGIOGRAPHY Right 02/25/2017   Procedure: LOWER EXTREMITY ANGIOGRAPHY;  Surgeon: Algernon Huxley, MD;  Location: [facility];  Service: Cardiovascular;  Laterality: Right;   LOWER EXTREMITY INTERVENTION  02/25/2017   Procedure: LOWER EXTREMITY INTERVENTION;  Surgeon: Algernon Huxley, MD;  Location: [facility];  Service: Cardiovascular;;   LUNG BIOPSY  1999   Negative   TONSILLECTOMY     Social History:  reports that she quit smoking about 16 years ago. Her smoking use included cigarettes. She has a 2.50 pack-year smoking history. She has never used smokeless tobacco. She reports that she does not drink alcohol and does not use drugs.  Allergies  Allergen Reactions   Baclofen Other (See Comments)   Codeine Nausea Only   Plavix [Clopidogrel]     brusing all over   Sulfa Antibiotics Nausea Only   Latex Rash    Family History  Problem Relation Age of Onset   Hypertension Mother  Hyperlipidemia Mother    Alzheimer's disease Mother    CAD Mother    Heart attack Father    Lung disease Sister    Heart disease Sister    Diabetes Paternal Grandfather        Type 2   Hearing loss Son     Prior to Admission medications   Medication Sig Start Date End Date Taking? Authorizing Provider  acetaminophen (TYLENOL) 500 MG tablet Take 500 mg by mouth every 8 (eight) hours as needed for mild pain or moderate pain.    [provider]  albuterol (VENTOLIN HFA) 108 (90 Base) MCG/ACT  inhaler Inhale 2 puffs into the lungs every 6 (six) hours as needed for wheezing or shortness of breath. 05/12/20   Lavera Guise, MD  amLODipine (NORVASC) 10 MG tablet Take 10 mg by mouth daily. 02/15/21   [provider]  Ascorbic Acid (VITAMIN C) 1000 MG tablet Take 1,000 mg by mouth daily.    [provider]  aspirin 81 MG tablet Take 81 mg by mouth daily.     [provider]  azelastine (ASTELIN) 0.1 % nasal spray Place 1 spray into both nostrils daily as needed. 04/17/20   [provider]  budesonide-formoterol (SYMBICORT) 160-4.5 MCG/ACT inhaler TAKE 1 PUFF BY MOUTH TWICE A DAY 10/02/20   Devona Konig A, MD  Calcium Carb-Cholecalciferol (CALCIUM 600+D3 PO) Take 1 tablet by mouth daily.    [provider]  carboxymethylcellulose (REFRESH PLUS) 0.5 % SOLN 1 drop 3 (three) times daily as needed.    [provider]  Coenzyme Q10 (COQ-10) 100 MG capsule Take 1 capsule (100 mg total) by mouth daily. 11/03/20   [name], FNP  docusate sodium (COLACE) 100 MG capsule Take 200 mg by mouth at bedtime.    [provider]  hydrochlorothiazide (HYDRODIURIL) 12.5 MG tablet Take 12.5 mg by mouth daily. 06/28/20 06/28/21  [provider]  metoprolol tartrate (LOPRESSOR) 25 MG tablet Take 1 tablet (25 mg total) by mouth 2 (two) times daily. 11/03/20   [name], FNP  Multiple Vitamin (MULTI-VITAMIN DAILY PO) Take by mouth.    [provider]  pantoprazole (PROTONIX) 40 MG tablet TAKE 1 TABLET (40 MG TOTAL) BY MOUTH ONCE DAILY TAKE 30 MINUTES BEFORE BREAKFAST. 11/03/20   [name], FNP  rosuvastatin (CRESTOR) 20 MG tablet Take 1 tablet (20 mg total) by mouth daily. 11/03/20   [name], FNP  tobramycin (TOBREX) 0.3 % ophthalmic solution  04/06/17   [provider]    Physical Exam: Vitals:   04/09/21 2027  BP: (!) 161/77  Pulse: 77  Resp: (!) 22  Temp: 99 F (37.2 C)  TempSrc: Oral  SpO2: 100%  Weight:  77.1 kg  Height: '5\' 5"'$  (1.651 m)   Physical Exam Vitals and nursing note reviewed.  Constitutional:      General: She is not in acute distress.    Appearance: Normal appearance.  HENT:     Head: Normocephalic and atraumatic.  Cardiovascular:     Rate and Rhythm: Normal rate and regular rhythm.     Pulses: Normal pulses.     Heart sounds: Normal heart sounds. No murmur heard. Pulmonary:     Effort: Pulmonary effort is normal. Tachypnea present.     Breath sounds: Rales present. No wheezing or rhonchi.  Abdominal:     General: Bowel sounds are normal.     Palpations: Abdomen is soft.  Tenderness: There is no abdominal tenderness.  Musculoskeletal:        General: No swelling or tenderness. Normal range of motion.     Cervical back: Normal range of motion and neck supple.     Comments: Trace lower extremity edema  Skin:    General: Skin is warm and dry.  Neurological:     General: No focal deficit present.     Mental Status: She is alert. Mental status is at baseline.  Psychiatric:        Mood and Affect: Mood normal.        Behavior: Behavior normal.     Data Reviewed: Relevant notes from primary care and specialist visits, past discharge summaries as available in EHR, including Care Everywhere. Prior diagnostic testing as pertinent to current admission diagnoses Updated medications and problem lists for reconciliation ED course, including vitals, labs, imaging, treatment and response to treatment Triage notes, nursing and pharmacy notes and ED provider's notes Notable results as noted in HPI   Assessment and Plan: * Pneumonia Rocephin and azithromycin  COPD with acute exacerbation ([place]) DuoNebs, scheduled and as needed IV Solu-Medrol Guaifenesin  Atrial fibrillation (HCC) EKG appears to be atrial fibrillation, rate controlled at 69 Continue metoprolol Will benefit from systemic anticoagulation for stroke prevention, pending further discussion of risks and  benefits Cardiology consult  Pleural effusion Possibly related to pneumonia, possibly CHF, A-fib on EKG Patient with chronic dyspnea, lower extremity edema Echocardiogram  Severe mitral regurgitation by prior echocardiogram Patient has chronic dyspnea We will get echocardiogram.  Last one was in 2019  PAD (peripheral artery disease) ([place]) History of stenting Continue aspirin and rosuvastatin  Stage 3a chronic kidney disease ([place]) Renal function at baseline  Coronary artery disease Patient has mild chest discomfort with coughing, mostly atypical.  EKG nonacute and troponin negative we will continue to trend Continue aspirin, metoprolol, rosuvastatin       Advance Care Planning:   Code Status: Prior full  Consults: Jefm Bryant, Dr Saralyn Pilar  Family Communication: none  Severity of Illness: The appropriate patient status for this patient is INPATIENT. Inpatient status is judged to be reasonable and necessary in order to provide the required intensity of service to ensure the patient's safety. The patient's presenting symptoms, physical exam findings, and initial radiographic and laboratory data in the context of their chronic comorbidities is felt to place them at high risk for further clinical deterioration. Furthermore, it is not anticipated that the patient will be medically stable for discharge from the hospital within 2 midnights of admission.   * I certify that at the point of admission it is my clinical judgment that the patient will require inpatient hospital care spanning beyond 2 midnights from the point of admission due to high intensity of service, high risk for further deterioration and high frequency of surveillance required.*  Author: Athena Masse, MD 04/09/2021 11:17 PM  For on call review [email or web address].

## 2021-04-10 ENCOUNTER — Inpatient Hospital Stay
Admit: 2021-04-10 | Discharge: 2021-04-10 | Disposition: A | Discharge status: Home / Self Care | Payer: Medicare Other | Attending: Internal Medicine | Admitting: Internal Medicine

## 2021-04-10 DIAGNOSIS — N1831 Chronic kidney disease, stage 3a: Secondary | ICD-10-CM

## 2021-04-10 DIAGNOSIS — I480 Paroxysmal atrial fibrillation: Secondary | ICD-10-CM

## 2021-04-10 DIAGNOSIS — J441 Chronic obstructive pulmonary disease with (acute) exacerbation: Secondary | ICD-10-CM

## 2021-04-10 DIAGNOSIS — J189 Pneumonia, unspecified organism: Principal | ICD-10-CM

## 2021-04-10 DIAGNOSIS — K449 Diaphragmatic hernia without obstruction or gangrene: Secondary | ICD-10-CM

## 2021-04-10 LAB — PROCALCITONIN: Procalcitonin: <0.1 ng/mL

## 2021-04-10 LAB — BASIC METABOLIC PANEL
Anion gap: 9 (ref 5–15)
BUN: 32 mg/dL — ABNORMAL HIGH (ref 8–23)
CO2: 24 mmol/L (ref 22–32)
Calcium: 8.3 mg/dL — ABNORMAL LOW (ref 8.9–10.3)
Chloride: 106 mmol/L (ref 98–111)
Creatinine, Ser: 1.06 mg/dL — ABNORMAL HIGH (ref 0.44–1.00)
GFR, Estimated: 52 mL/min — ABNORMAL LOW (ref >60)
Glucose, Bld: 167 mg/dL — ABNORMAL HIGH (ref 70–99)
Potassium: 3.9 mmol/L (ref 3.5–5.1)
Sodium: 139 mmol/L (ref 135–145)

## 2021-04-10 LAB — ECHOCARDIOGRAM COMPLETE
AR max vel: 1.68 cm2
AV Area VTI: 1.53 cm2
AV Area mean vel: 1.52 cm2
AV Mean grad: 3 mmHg
AV Peak grad: 4.8 mmHg
Ao pk vel: 1.1 m/s
Area-P 1/2: 3.39 cm2
Height: 65 in
MV VTI: 2.3 cm2
S' Lateral: 3.35 cm
Weight: 2720 oz

## 2021-04-10 LAB — BRAIN NATRIURETIC PEPTIDE: B Natriuretic Peptide: 396.4 pg/mL — ABNORMAL HIGH (ref 0.0–100.0)

## 2021-04-10 MED ORDER — ALBUTEROL SULFATE HFA 108 (90 BASE) MCG/ACT IN AERS: 2.0000 | INHALATION_SPRAY | Freq: Four times a day (QID) | RESPIRATORY_TRACT | Status: DC | PRN

## 2021-04-10 MED ORDER — ASPIRIN EC 81 MG PO TBEC
81.0000 mg | DELAYED_RELEASE_TABLET | Freq: Every day | ORAL | Status: DC
  Administered 2021-04-11: 81 mg via ORAL
  Filled 2021-04-10: qty 1

## 2021-04-10 MED ORDER — MOMETASONE FURO-FORMOTEROL FUM 200-5 MCG/ACT IN AERO
2.0000 | INHALATION_SPRAY | Freq: Two times a day (BID) | RESPIRATORY_TRACT | Status: DC
  Administered 2021-04-11: 2 via RESPIRATORY_TRACT
  Filled 2021-04-10 (×2): qty 8.8

## 2021-04-10 MED ORDER — DOCUSATE SODIUM 100 MG PO CAPS
200.0000 mg | ORAL_CAPSULE | Freq: Every evening | ORAL | Status: DC
  Administered 2021-04-10: 200 mg via ORAL
  Filled 2021-04-10: qty 2

## 2021-04-10 MED ORDER — AZELASTINE HCL 0.1 % NA SOLN
1.0000 | Freq: Every day | NASAL | Status: DC | PRN
  Filled 2021-04-10: qty 30

## 2021-04-10 MED ORDER — ASPIRIN EC 81 MG PO TBEC
81.0000 mg | DELAYED_RELEASE_TABLET | Freq: Every day | ORAL | Status: DC
  Administered 2021-04-10: 81 mg via ORAL
  Filled 2021-04-10: qty 1

## 2021-04-10 MED ORDER — APIXABAN 5 MG PO TABS
5.0000 mg | ORAL_TABLET | Freq: Two times a day (BID) | ORAL | Status: DC
  Filled 2021-04-10: qty 1

## 2021-04-10 MED ORDER — POLYVINYL ALCOHOL 1.4 % OP SOLN
1.0000 [drp] | Freq: Three times a day (TID) | OPHTHALMIC | Status: DC | PRN
  Filled 2021-04-10: qty 15

## 2021-04-10 MED ORDER — SODIUM CHLORIDE 0.9 % IV SOLN: INTRAVENOUS | Status: DC | PRN

## 2021-04-10 MED ORDER — AMLODIPINE BESYLATE 5 MG PO TABS
2.5000 mg | ORAL_TABLET | Freq: Every day | ORAL | Status: DC
Start: 2021-04-11 — End: 2021-04-11

## 2021-04-10 MED ORDER — AZITHROMYCIN 250 MG PO TABS
500.0000 mg | ORAL_TABLET | Freq: Every day | ORAL | Status: DC
  Administered 2021-04-10 – 2021-04-11 (×2): 500 mg via ORAL
  Filled 2021-04-10 (×2): qty 2

## 2021-04-10 MED ORDER — PANTOPRAZOLE SODIUM 40 MG PO TBEC
40.0000 mg | DELAYED_RELEASE_TABLET | Freq: Every day | ORAL | Status: DC
  Administered 2021-04-10 – 2021-04-11 (×2): 40 mg via ORAL
  Filled 2021-04-10 (×2): qty 1

## 2021-04-10 MED ORDER — AMLODIPINE BESYLATE 5 MG PO TABS
10.0000 mg | ORAL_TABLET | Freq: Every day | ORAL | Status: DC
Start: 2021-04-10 — End: 2021-04-10
  Administered 2021-04-10: 10 mg via ORAL
  Filled 2021-04-10: qty 2

## 2021-04-10 MED ORDER — METOPROLOL TARTRATE 50 MG PO TABS
50.0000 mg | ORAL_TABLET | Freq: Two times a day (BID) | ORAL | Status: DC
  Administered 2021-04-10 – 2021-04-11 (×3): 50 mg via ORAL
  Filled 2021-04-10 (×3): qty 1

## 2021-04-10 NOTE — TOC Initial Note (Signed)
Transition of Care (TOC) - Initial/Assessment Note  ? ? ?Patient Details  ?Name: Christina [name] ?MRN: [id number] ?Date of Birth: 03-Jul-1935 ? ?Transition of Care (TOC) CM/SW Contact:    ?Shelbie Hutching, RN ?Phone Number: ?04/10/2021, 4:48 PM ? ?Clinical Narrative:                 ?Patient admitted to the hospital for COPD exacerbation and pneumonia.  RNCM met with patient at the bedside, introduced self and explained role.  Patient is from home with her husband.  She is independent she does have a rollator when she goes out.  She drives some but her husband does most of the driving.  Patient is current with her PCP. ? ?Patient declines home health services at this time.  Her husband will pick her up at discharge.  ?  ? ?Expected Discharge Plan: Home/Self Care ?Barriers to Discharge: Continued Medical Work up ? ? ?Patient Goals and CMS Choice ?Patient states their goals for this hospitalization and ongoing recovery are:: Patient wants to get back home ?  ?  ? ?Expected Discharge Plan and Services ?Expected Discharge Plan: Home/Self Care ?  ?Discharge Planning Services: CM Consult ?Post Acute Care Choice: NA ?Living arrangements for the past 2 months: [place] ?                ?DME Arranged: N/A ?DME Agency: NA ?  ?  ?  ?HH Arranged: Patient Refused HH ?  ?  ?  ?  ? ?Prior Living Arrangements/Services ?Living arrangements for the past 2 months: [place] ?Lives with:: Spouse ?Patient language and need for interpreter reviewed:: Yes ?Do you feel safe going back to the place where you live?: Yes      ?Need for Family Participation in Patient Care: Yes (Comment) ?Care giver support system in place?: Yes (comment) (husband) ?  ?Criminal Activity/Legal Involvement Pertinent to Current Situation/Hospitalization: No - Comment as needed ? ?Activities of Daily Living ?Home Assistive Devices/Equipment: Eyeglasses, Dentures (specify type), Walker (specify type) (partial upper and lower) ?ADL Screening  (condition at time of admission) ?Patient's cognitive ability adequate to safely complete daily activities?: Yes ?Is the patient deaf or have difficulty hearing?: No ?Does the patient have difficulty seeing, even when wearing glasses/contacts?: No ?Does the patient have difficulty concentrating, remembering, or making decisions?: No ?Patient able to express need for assistance with ADLs?: Yes ?Does the patient have difficulty dressing or bathing?: No ?Independently performs ADLs?: Yes (appropriate for developmental age) ?Does the patient have difficulty walking or climbing stairs?: No ?Weakness of Legs: None ?Weakness of Arms/Hands: None ? ?Permission Sought/Granted ?Permission sought to share information with : Case Manager, Family Supports, Other (comment) ?Permission granted to share information with : Yes, Verbal Permission Granted ? Share Information with NAME: Mackenna Kamer ?   ? Permission granted to share info w Relationship: husband ? Permission granted to share info w Contact Information: [phone] ? ?Emotional Assessment ?Appearance:: Appears stated age ?Attitude/Demeanor/Rapport: Engaged ?Affect (typically observed): Accepting ?Orientation: : Oriented to Self, Oriented to Place, Oriented to  Time, Oriented to Situation ?Alcohol / Substance Use: Not Applicable ?Psych Involvement: No (comment) ? ?Admission diagnosis:  Pneumonia [J18.9] ?SOB (shortness of breath) [R06.02] ?COPD exacerbation ([place]) [J44.1] ?Patient Active Problem List  ? Diagnosis Date Noted  ? Pneumonia 04/09/2021  ? Atrial fibrillation ([place])   ? Primary hypertension 11/24/2020  ? Need for influenza vaccination 11/03/2020  ? Hiatal hernia with gastroesophageal reflux disease without esophagitis 11/03/2020  ?  Atherosclerosis of both carotid arteries 03/18/2020  ? Memory loss 03/18/2020  ? Hand arthritis 03/18/2020  ? Neck pain 03/18/2020  ? Aortic atherosclerosis ([place]) 10/26/2016  ? Claudication ([place]) 10/26/2016  ? COPD with acute  exacerbation ([place]) 10/22/2016  ? OSA on CPAP 09/16/2015  ? Abnormal chest x-ray 01/25/2015  ? Atrophic vaginitis 01/25/2015  ? Coronary artery disease 01/25/2015  ? Stage 3a chronic kidney disease ([place]) 01/25/2015  ? Bloodgood disease 01/25/2015  ? Blood glucose elevated 01/25/2015  ? Hyperlipidemia 01/25/2015  ? Cannot sleep 01/25/2015  ? Hypertension 11/02/2014  ? Acid reflux 11/02/2014  ? Arthritis 10/19/2014  ? Adenomatous colon polyp 07/27/2013  ? Gastric catarrh 07/27/2013  ? Hiatal hernia 07/27/2013  ? Barrett esophagus 07/07/2013  ? Osteopenia 12/13/2009  ? PAD (peripheral artery disease) ([place]) 09/24/2007  ? DD (diverticular disease) 12/19/2005  ? ?PCP:  [name], FNP ?Pharmacy:   ?[id number] - [place], [place] - [address] ?[address] ?[place] [address] ?Phone: [phone] Fax: [phone] ? ? ? ? ?Social Determinants of Health (SDOH) Interventions ?  ? ?Readmission Risk Interventions ?Readmission Risk Prevention Plan 04/10/2021  ?Transportation Screening Complete  ?PCP or Specialist Appt within 5-7 Days Complete  ?Home Care Screening Patient refused  ?Medication Review (RN CM) Complete  ?Some recent data might be hidden  ? ? ? ?

## 2021-04-10 NOTE — ED Notes (Signed)
Patient is resting comfortably on stretcher with eyes closed. RR even and unlabored. Patient verbalizes no needs or complaints at this time. Patient remains on 2lpm via [place] in leu of her CPAP after her sats dropped earlier in the evening while she was asleep. ?

## 2021-04-10 NOTE — ED Notes (Signed)
After receiving her medication, patient was assisted to the bathroom with minimal assistance. Patient only needed assistance due to having her monitoring equipment on. Patient is steady on her feet. Patient returned to bed and monitoring equipment was placed back on her. Patient verbalized no other need or complaints at this time. ?

## 2021-04-10 NOTE — ED Notes (Signed)
Patient was placed on oxygen at 2lpm via [place] for sustained sats of 88-89%. Patient had previously stated that she normally uses a CPAP but did not have it available. Patient had no other needs or complaints at this time.  ?

## 2021-04-10 NOTE — ED Notes (Signed)
Notified lab to add BNP to blood in lab. Lab tech stated that they would. ?

## 2021-04-10 NOTE — Evaluation (Signed)
Occupational Therapy Evaluation ?Patient Details ?Name: Christina [name] ?MRN: [id number] ?DOB: [birth year] ?Today's Date: 04/10/2021 ? ? ?History of Present Illness BREONNA GAFFORD is a 86 y.o. female with medical history significant for CAD s/p CABG x 3, severe tricuspid regurg on echo 2019, COPD, HTN CKD 3a, PAD, chronic dyspnea who presents to the ED with  shortness of breath beyond her baseline, cough and fatigue which became more apparent after taking a walk with her husband on the day of arrival.  ? ?Clinical Impression ?  ?Pt was seen for OT evaluation this date. Prior to hospital admission, pt was mod indep with mobility outside (rollator) otherwise independent. Pt lives with spouse in a 1 story level entry home, denies falls. Currently pt demonstrates mild impairments in activity tolerance as described below (See OT problem list) which minimally functionally limit her ability to perform ADL/self-care tasks. Pt currently requires increased time/effort to perform tasks, HR up briefly to 112 while tying her shoes while seated EOB. No LOB with in room mobility. Pt would benefit from skilled OT services via [place] to address noted impairments and functional limitations (see below for any additional details) in order to maximize safety and independence while minimizing falls risk and caregiver burden.   ? ?Recommendations for follow up therapy are one component of a multi-disciplinary discharge planning process, led by the attending physician.  Recommendations may be updated based on patient status, additional functional criteria and insurance authorization.  ? ?Follow Up Recommendations ? Home health OT  ?  ?Assistance Recommended at Discharge PRN  ?Patient can return home with the following Assist for transportation;Assistance with cooking/housework ? ?  ?Functional Status Assessment ? Patient has had a recent decline in their functional status and demonstrates the ability to make significant improvements in  function in a reasonable and predictable amount of time.  ?Equipment Recommendations ? Tub/shower seat  ?  ?Recommendations for Other Services   ? ? ?  ?Precautions / Restrictions Precautions ?Precautions: Fall ?Precaution Comments: low falls risk ?Restrictions ?Weight Bearing Restrictions: No  ? ?  ? ?Mobility Bed Mobility ?Overal bed mobility: Modified Independent ?  ?  ?  ?  ?  ?  ?  ?  ? ?Transfers ?Overall transfer level: Needs assistance ?Equipment used: None ?Transfers: Sit to/from Stand ?Sit to Stand: Supervision ?  ?  ?  ?  ?  ?  ?  ? ?  ?Balance Overall balance assessment: Needs assistance ?Sitting-balance support: No upper extremity supported, Feet supported ?Sitting balance-Leahy Scale: Good ?  ?  ?Standing balance support: No upper extremity supported ?Standing balance-Leahy Scale: Fair ?  ?  ?  ?  ?  ?  ?  ?  ?  ?  ?  ?  ?   ? ?ADL either performed or assessed with clinical judgement  ? ?ADL Overall ADL's : Modified independent ?  ?  ?  ?  ?  ?  ?  ?  ?  ?  ?  ?  ?  ?  ?  ?  ?  ?  ?  ?General ADL Comments: Pt able to complete LB ADL with mod indep, requiring increased time to support breath recover with HR up to 112 briefly with bending over to tie her shoes.  ? ? ? ?Vision   ?   ?   ?Perception   ?  ?Praxis   ?  ? ?Pertinent Vitals/Pain Pain Assessment ?Pain Assessment: No/denies pain  ? ? ? ?Hand Dominance   ?  ?  Extremity/Trunk Assessment Upper Extremity Assessment ?Upper Extremity Assessment: Overall WFL for tasks assessed ?  ?Lower Extremity Assessment ?Lower Extremity Assessment: Overall WFL for tasks assessed ?  ?Cervical / Trunk Assessment ?Cervical / Trunk Assessment: Normal ?  ?Communication Communication ?Communication: No difficulties ?  ?Cognition Arousal/Alertness: Awake/alert ?Behavior During Therapy: [facility] for tasks assessed/performed ?Overall Cognitive Status: Within Functional Limits for tasks assessed ?  ?  ?  ?  ?  ?  ?  ?  ?  ?  ?  ?  ?  ?  ?  ?  ?  ?  ?  ?General Comments  removed at  start of session with RN in agreement; On room air, SpO2 >/= 94% during session ? ?  ?Exercises Other Exercises ?Other Exercises: Pt/spouse educated in ECS ?  ?Shoulder Instructions    ? ? ?Home Living Family/patient expects to be discharged to:: Private residence ?Living Arrangements: Spouse/significant other ?Available Help at Discharge: Family;Available 24 hours/day ?Type of Home: House ?Home Access: Level entry ?  ?  ?Home Layout: One level ?  ?  ?Bathroom Shower/Tub: Gaffer;Tub only ?  ?Bathroom Toilet: Handicapped height ?  ?  ?Home Equipment: Rollator (4 wheels);Other (comment) (walking stick) ?  ?  ?  ? ?  ?Prior Functioning/Environment Prior Level of Function : Independent/Modified Independent;Driving ?  ?  ?  ?  ?  ?  ?Mobility Comments: uses rollator for community distances recently ?ADLs Comments: limited driving 2/2 macular degeneration ?  ? ?  ?  ?OT Problem List: Decreased activity tolerance ?  ?   ?OT Treatment/Interventions:    ?  ?OT Goals(Current goals can be found in the care plan section) Acute Rehab OT Goals ?Patient Stated Goal: go home ?OT Goal Formulation: All assessment and education complete, DC therapy  ?OT Frequency:   ?  ? ?Co-evaluation   ?  ?  ?  ?  ? ?  ?AM-PAC OT "6 Clicks" Daily Activity     ?Outcome Measure Help from another person eating meals?: None ?Help from another person taking care of personal grooming?: None ?Help from another person toileting, which includes using toliet, bedpan, or urinal?: None ?Help from another person bathing (including washing, rinsing, drying)?: None ?Help from another person to put on and taking off regular upper body clothing?: None ?Help from another person to put on and taking off regular lower body clothing?: None ?6 Click Score: 24 ?  ?End of Session   ? ?Activity Tolerance: Patient tolerated treatment well ?Patient left: in bed;with call bell/phone within reach;with family/visitor present;with nursing/sitter in room ? ?OT Visit  Diagnosis: Other abnormalities of gait and mobility (R26.89)  ?              ?Time: 7948-0165 ?OT Time Calculation (min): 27 min ?Charges:  OT General Charges ?$OT Visit: 1 Visit ?OT Evaluation ?$OT Eval Low Complexity: 1 Low ?OT Treatments ?$Self Care/Home Management : 8-22 mins ? ?Ardeth Perfect., MPH, MS, OTR/L ?ascom [phone] ?04/10/21, 12:25 PM ? ?

## 2021-04-10 NOTE — Consult Note (Signed)
[facility] NOTE       Patient ID: Christina [name] MRN: [id number] DOB/AGE: 84-Aug-1937 84 y.o.  Admit date: 04/09/2021 Referring Physician Dr. Judd Gaudier Primary Physician [name], PA-C Primary Cardiologist Dr. [name] Reason for Consultation AF  HPI: The patient is an 86 year old female with a past medical history notable for CAD s/p CABG x 3, COPD, hypertension, OSA on CPAP, hyperlipidemia, GERD presented to [facility] ED 04/09/2021 with SOB worse from her baseline with cough and fatigue.  The patient was seen by her primary care provider on 04/06/2021 where her amlodipine was discontinued due to leg swelling, her metoprolol tartrate was changed to 75 mg 3 times daily and HCTZ was continued at current dose, EKG during that visit showed atrial fibrillation which appears to be new onset.  After that visit she was referred back to see Dr. [name] but she apparently refused for unknown reasons.  Today the patient presents stating she has felt badly for about 1 week with dry cough, and significant fatigue.  She states she has been short of breath with minimal exertion and so she presented to the ED. She denies chest pain, palpitations, presyncope.  Denies fever, chills.  She says her leg swelling improved with discontinuation of amlodipine and wearing compression stockings.  She feels better this morning after receiving DuoNebs and Solu-Medrol yesterday.  Vitals are notable for blood pressure peaking at 168/98 overnight, now down to 114/86 during interview, SPO2 95% on room air.  Labs on admission are notable for creatinine of 1.23, EGFR 44, improving to 1.06 and 52 today, potassium 3.9.  BNP mildly elevated at 396.4, high-sensitivity troponin flat 7-8. H/H stable 12/38, plts 189.    Review of systems complete and found to be negative unless listed above     Past Medical History:  Diagnosis Date   Arthritis    COPD (chronic obstructive pulmonary disease) (HCC)     Coronary artery disease    GERD (gastroesophageal reflux disease)    Hyperlipidemia    Hypertension    Macular degeneration    PAD (peripheral artery disease) ([place])    Sleep apnea     Past Surgical History:  Procedure Laterality Date   ABDOMINAL HYSTERECTOMY  1980   due to dysfunctional uterine bleeding   APPENDECTOMY  1980   BREAST SURGERY Left 2000   biopsy   CARDIAC CATHETERIZATION     COLONOSCOPY     CORONARY ARTERY BYPASS GRAFT  2006   HEMORRHOID SURGERY     LOWER EXTREMITY ANGIOGRAPHY Right 02/25/2017   Procedure: LOWER EXTREMITY ANGIOGRAPHY;  Surgeon: Algernon Huxley, MD;  Location: [facility];  Service: Cardiovascular;  Laterality: Right;   LOWER EXTREMITY INTERVENTION  02/25/2017   Procedure: LOWER EXTREMITY INTERVENTION;  Surgeon: Algernon Huxley, MD;  Location: [facility];  Service: Cardiovascular;;   LUNG BIOPSY  1999   Negative   TONSILLECTOMY      (Not in a hospital admission)  Social History   Socioeconomic History   Marital status: Married    Spouse name: Christina [name]   Number of children: 2   Years of education: 12   Highest education level: 12th grade  Occupational History   Occupation: retired  Tobacco Use   Smoking status: Former    Packs/day: 0.50    Years: 5.00    Pack years: 2.50    Types: Cigarettes    Quit date: 04/24/2004    Years since quitting: 40.9  Smokeless tobacco: Never  Vaping Use   Vaping Use: Never used  Substance and Sexual Activity   Alcohol use: No   Drug use: No   Sexual activity: Yes  Other Topics Concern   Not on file  Social History Narrative   Not on file   Social Determinants of Health   Financial Resource Strain: Low Risk    Difficulty of Paying Living Expenses: Not hard at all  Food Insecurity: Not on file  Transportation Needs: Not on file  Physical Activity: Not on file  Stress: Not on file  Social Connections: Not on file  Intimate Partner Violence: Not on file    Family History   Problem Relation Age of Onset   Hypertension Mother    Hyperlipidemia Mother    Alzheimer's disease Mother    CAD Mother    Heart attack Father    Lung disease Sister    Heart disease Sister    Diabetes Paternal Grandfather        Type 2   Hearing loss Son       Review of systems complete and found to be negative unless listed above    PHYSICAL EXAM General: Elderly Caucasian female, well nourished, in no acute distress.  Sitting upright in ED stretcher, husband at bedside HEENT:  Normocephalic and atraumatic. Neck:  No JVD.  Lungs: Normal respiratory effort on room air. Clear bilaterally to auscultation. No wheezes, crackles, rhonchi.  Heart: Irregularly irregular rhythm with controlled rate. Normal S1 and S2 without gallops or murmurs. Radial & DP pulses 2+ bilaterally. Abdomen: Non-distended appearing.  Msk: Normal strength and tone for age. Extremities: Warm and well perfused. No clubbing, cyanosis.  No peripheral edema.  Neuro: Alert and oriented X 3. Psych:  Answers questions appropriately.   Labs:   Lab Results  Component Value Date   WBC 7.1 04/09/2021   HGB 12.3 04/09/2021   HCT 38.4 04/09/2021   MCV 105.5 (H) 04/09/2021   PLT 189 04/09/2021    Recent Labs  Lab 04/10/21 0454  NA 139  K 3.9  CL 106  CO2 24  BUN 32*  CREATININE 1.06*  CALCIUM 8.3*  GLUCOSE 167*   No results found for: CKTOTAL, CKMB, CKMBINDEX, TROPONINI  Lab Results  Component Value Date   CHOL 159 03/18/2020   CHOL 164 02/20/2019   CHOL 165 02/17/2018   Lab Results  Component Value Date   HDL 59 03/18/2020   HDL 54 02/20/2019   HDL 55 02/17/2018   Lab Results  Component Value Date   LDLCALC 75 03/18/2020   LDLCALC 73 02/20/2019   LDLCALC 74 02/17/2018   Lab Results  Component Value Date   TRIG 143 03/18/2020   TRIG 227 (H) 02/20/2019   TRIG 178 (H) 02/17/2018   Lab Results  Component Value Date   CHOLHDL 3.0 02/20/2019   CHOLHDL 3.0 02/17/2018   CHOLHDL 2.9  02/07/2017   No results found for: LDLDIRECT    Radiology: DG Chest 2 View  Result Date: 04/09/2021 CLINICAL DATA:  Shortness of breath. EXAM: CHEST - 2 VIEW COMPARISON:  Radiograph 07/09/2019 FINDINGS: Patient is post median sternotomy and CABG. Heart size is stable, partially obscured by large hiatal hernia. Aortic atherosclerosis. Hiatal hernia projecting to the right of the heart with air-fluid level. There is increasing opacity at the right lung base with right pleural effusion. Trace left pleural effusion. Chronic bronchial thickening. Calcified granuloma in the left mid lung. No pneumothorax. IMPRESSION: 1. Increasing  opacity at the right lung base with right pleural effusion, may be compressive atelectasis related to large hiatal hernia or airspace disease/pneumonia. 2. Trace left pleural effusion. 3. Large hiatal hernia. Electronically Signed   By: Keith Rake M.D.   On: 04/09/2021 21:15   ECHOCARDIOGRAM COMPLETE  Result Date: 04/10/2021    ECHOCARDIOGRAM REPORT   Patient Name:   Christina [name] Date of Exam: 04/10/2021 Medical Rec #:  [id number]           Height:       65.0 in Accession #:    [id number]          Weight:       170.0 lb Date of Birth:  September 30, 1935           BSA:          1.846 m Patient Age:    39 years            BP:           151/92 mmHg Patient Gender: F                   HR:           93 bpm. Exam Location:  [facility] Procedure: 2D Echo, Color Doppler and Cardiac Doppler Indications:     I50.31 congestive heart failure-Acute Diastolic  History:         Patient has no prior history of Echocardiogram examinations.                  CAD, COPD and PAD, Arrythmias:Atrial Fibrillation; Risk                  Factors:Hypertension, Dyslipidemia and Sleep Apnea.  Sonographer:     Charmayne Sheer Referring Phys:  [id number] Athena Masse Diagnosing Phys: Donnelly Angelica IMPRESSIONS  1. Left ventricular ejection fraction, by estimation, is 50 to 55%. The left ventricle has low normal function. The left  ventricle has no regional wall motion abnormalities. There is mild left ventricular hypertrophy. Left ventricular diastolic parameters are indeterminate.  2. Right ventricular systolic function is mildly reduced. The right ventricular size is mildly enlarged.  3. Left atrial size was moderately dilated.  4. Right atrial size was moderately dilated.  5. The mitral valve is normal in structure. Mild to moderate mitral valve regurgitation. No evidence of mitral stenosis.  6. Tricuspid valve regurgitation is severe.  7. The aortic valve is normal in structure. Aortic valve regurgitation is not visualized. No aortic stenosis is present. FINDINGS  Left Ventricle: Left ventricular ejection fraction, by estimation, is 50 to 55%. The left ventricle has low normal function. The left ventricle has no regional wall motion abnormalities. The left ventricular internal cavity size was normal in size. There is mild left ventricular hypertrophy. Left ventricular diastolic parameters are indeterminate. Right Ventricle: The right ventricular size is mildly enlarged. No increase in right ventricular wall thickness. Right ventricular systolic function is mildly reduced. Left Atrium: Left atrial size was moderately dilated. Right Atrium: Right atrial size was moderately dilated. Pericardium: There is no evidence of pericardial effusion. Mitral Valve: The mitral valve is normal in structure. Mild to moderate mitral valve regurgitation. No evidence of mitral valve stenosis. MV peak gradient, 5.2 mmHg. The mean mitral valve gradient is 2.0 mmHg. Tricuspid Valve: The tricuspid valve is normal in structure. Tricuspid valve regurgitation is severe. Aortic Valve: The aortic valve is normal in structure. Aortic valve regurgitation is not visualized. No  aortic stenosis is present. Aortic valve mean gradient measures 3.0 mmHg. Aortic valve peak gradient measures 4.8 mmHg. Aortic valve area, by VTI measures 1.53 cm. Pulmonic Valve: The pulmonic  valve was not well visualized. Pulmonic valve regurgitation is not visualized. Aorta: The aortic root is normal in size and structure. Venous: Unclear if IVC visualized versus abdominal aorta. The inferior vena cava was not well visualized. IAS/Shunts: No atrial level shunt detected by color flow Doppler.  LEFT VENTRICLE PLAX 2D LVIDd:         4.01 cm   Diastology LVIDs:         3.35 cm   LV e' medial:    12.00 cm/s LV PW:         1.30 cm   LV E/e' medial:  7.6 LV IVS:        1.00 cm   LV e' lateral:   12.90 cm/s LVOT diam:     1.80 cm   LV E/e' lateral: 7.1 LV SV:         36 LV SV Index:   19 LVOT Area:     2.54 cm  RIGHT VENTRICLE RV Basal diam:  4.19 cm LEFT ATRIUM             Index        RIGHT ATRIUM           Index LA diam:        4.60 cm 2.49 cm/m   RA Area:     22.90 cm LA Vol (A2C):   50.5 ml 27.35 ml/m  RA Volume:   64.50 ml  34.94 ml/m LA Vol (A4C):   91.2 ml 49.40 ml/m LA Biplane Vol: 74.3 ml 40.25 ml/m  AORTIC VALVE                    PULMONIC VALVE AV Area (Vmax):    1.68 cm     PV Vmax:       0.89 m/s AV Area (Vmean):   1.52 cm     PV Vmean:      60.000 cm/s AV Area (VTI):     1.53 cm     PV VTI:        0.174 m AV Vmax:           110.00 cm/s  PV Peak grad:  3.2 mmHg AV Vmean:          82.300 cm/s  PV Mean grad:  2.0 mmHg AV VTI:            0.235 m AV Peak Grad:      4.8 mmHg AV Mean Grad:      3.0 mmHg LVOT Vmax:         72.50 cm/s LVOT Vmean:        49.300 cm/s LVOT VTI:          0.141 m LVOT/AV VTI ratio: 0.60  AORTA Ao Root diam: 3.10 cm MITRAL VALVE               TRICUSPID VALVE MV Area (PHT): 3.39 cm    TR Peak grad:   30.9 mmHg MV Area VTI:   2.30 cm    TR Vmax:        278.00 cm/s MV Peak grad:  5.2 mmHg MV Mean grad:  2.0 mmHg    SHUNTS MV Vmax:       1.14 m/s    Systemic VTI:  0.14 m MV  Vmean:      67.6 cm/s   Systemic Diam: 1.80 cm MV Decel Time: 224 msec MV E velocity: 91.45 cm/s Donnelly Angelica Electronically signed by Donnelly Angelica Signature Date/Time: 04/10/2021/10:53:13 AM    Final      ECHO  LVEF 50-55% with biatrial enlargement, mild to moderate MR, severe TR (similar to prior from 06/2020)  TELEMETRY reviewed by me: Atrial fibrillation with rate between 81-99  EKG reviewed by me: A-fib rate 69  ASSESSMENT AND PLAN:  The patient is an 86 year old female with a past medical history notable for CAD s/p CABG x 3, COPD, hypertension, OSA on CPAP, hyperlipidemia, GERD presented to [facility] ED 04/09/2021 with SOB worse from her baseline with cough and fatigue.  #Paroxysmal atrial fibrillation #CAP #COPD The patient presents with 1 week of cough and worsening dyspnea on exertion from her baseline, and severe fatigue.  Chest x-ray showed possible pneumonia in the right lower lung.  She feels better after receiving IV Solu-Medrol and DuoNeb treatments and was started on ceftriaxone and azithromycin and is off supplemental oxygen.  She was seen by her PCP 04/06/2021 where her EKG showed A-fib with controlled rate, and on admission shows A-fib with rate of 69 and has remained rate controlled while she has been hospitalized so far.  She denies chest discomfort, palpitations this morning and remains rate controlled on metoprolol. -Agree with current therapy -continue metoprolol tartrate for rate control -Recommend starting Eliquis 5 mg twice daily for stroke risk reduction.  Risks and benefits of medication were discussed with patient and she was instructed to watch for signs of bleeding and she is agreeable to start. -she will need close follow-up with her regular cardiologist Dr. [name] in 1 to 2 weeks after discharge.  #Mild to moderate MR, severe TR #CAD s/p CABG x3 #Hypertension #Hyperlipidemia -Continue simvastatin and aspirin $RemoveBef'81mg'[id number]$  daily  -continue HCTZ 12.$RemoveBeforeDEI'5mg'[id number]$  -discontinue amlodipine d/t history of leg swelling while on it  This patient's plan of care was discussed and created with Dr. Donnelly Angelica and he is in agreement.  Signed: Tristan Schroeder , PA-C 04/10/2021, 12:32  PM [facility]

## 2021-04-10 NOTE — ED Notes (Signed)
Assisted with pt's lunch tray. Denies complaints at this time.  ?

## 2021-04-10 NOTE — Progress Notes (Signed)
?  Progress Note ? ? ?Patient: Christina [name] EMV:[id number] DOB: Apr 08, 1935 DOA: 04/09/2021     1 ?DOS: the patient was seen and examined on 04/10/2021 ? ? ?Assessment and Plan: ?* Pneumonia ?Possible pneumonia.  Patient started on Rocephin and Zithromax.  Will check procalcitonin ? ?COPD with acute exacerbation ([place]) ?Patient given Solu-Medrol be switched over to prednisone for tomorrow.  Continue inhalers and nebulizer treatments. ? ? ?Atrial fibrillation ([place]) ?Increase metoprolol 50 mg twice a day.  Cardiology started Eliquis.  EF normal with mild to moderate MR and severe TR. ? ?Hiatal hernia ?On previous imaging studies the patient does have a hiatal hernia with part of the stomach in the right chest.  This may be contributing to the patient's atrial fibrillation.  Case discussed with Dr. Adora Fridge general surgery and he will follow-up as outpatient. ? ?Acid reflux ?Continue PPI ? ?PAD (peripheral artery disease) ([place]) ?Continue aspirin and rosuvastatin ? ?Stage 3a chronic kidney disease ([place]) ?Creatinine 1.23 on coming in and 1.06 currently GFR still 52 ? ? ? ? ?  ? ?Subjective: Patient coming in with shortness of breath and cough.  Had some wheeze.  Her cough is dry.  When she does anything she gets short of breath.  She did have some swelling of the legs.  She states that she has been having some issues with blood pressure.  She stated she was never told that she has part of her stomach in her chest. ? ?Physical Exam: ?Vitals:  ? 04/10/21 1000 04/10/21 1115 04/10/21 1200 04/10/21 1300  ?BP: (!) 144/95 132/76 114/86 118/68  ?Pulse: 84 88 89 89  ?Resp: (!) 22 (!) '22 16 19  '$ ?Temp:      ?TempSrc:      ?SpO2: 100% 93% 95% 95%  ?Weight:      ?Height:      ? ?Physical Exam ?HENT:  ?   Head: Normocephalic.  ?   Mouth/Throat:  ?   Pharynx: No oropharyngeal exudate.  ?Eyes:  ?   General: Lids are normal.  ?   Conjunctiva/sclera: Conjunctivae normal.  ?Cardiovascular:  ?   Rate and Rhythm: Normal rate. Rhythm irregularly  irregular.  ?   Heart sounds: Normal heart sounds, S1 normal and S2 normal.  ?Pulmonary:  ?   Breath sounds: Examination of the right-lower field reveals decreased breath sounds and rhonchi. Decreased breath sounds and rhonchi present. No wheezing or rales.  ?Abdominal:  ?   Palpations: Abdomen is soft.  ?   Tenderness: There is no abdominal tenderness.  ?Musculoskeletal:  ?   Right lower leg: No swelling.  ?   Left lower leg: No swelling.  ?Skin: ?   General: Skin is warm.  ?   Findings: No rash.  ?Neurological:  ?   Mental Status: She is alert and oriented to person, place, and time.  ?  ?Data Reviewed: ?Previous CT scan of the chest showing hiatal hernia into the right side of the chest.  EKG showing atrial fibrillation, creatinine 1.06 ? ?Family Communication:  ? ?Disposition: ?Status is: Inpatient ?Remains inpatient appropriate because: Being treated for pneumonia and COPD exacerbation and atrial fibrillation. ? Planned Discharge Destination: Home ? ? ?Author: ?Loletha Grayer, MD ?04/10/2021 2:21 PM ? ?For on call review [email or web address].  ?

## 2021-04-10 NOTE — ED Notes (Signed)
Patient assisted up to the bathroom with minimal assistance mainly due to her monitoring equipment. Patient then returned to bed and blood was drawn. Patient verbalized no other needs or complaints at this time. She did request that she have access to a phone around 0830 to call her husband. Patient was placed back on oxygen at 2lpm via [place] in leu of her CPAP after her sats dropped earlier in the evening. She stated she planned to go back to sleep for a bit. ?

## 2021-04-10 NOTE — ED Notes (Signed)
Pt eating bfast, ao x 4. ?

## 2021-04-10 NOTE — Evaluation (Signed)
Physical Therapy Evaluation ?Patient Details ?Name: Christina [name] ?MRN: [id number] ?DOB: 1935-08-20 ?Today's Date: 04/10/2021 ? ?History of Present Illness ? Christina [name] is a 86 y.o. female with medical history significant for CAD s/p CABG x 3, severe tricuspid regurg on echo 2019, COPD, HTN CKD 3a, PAD, chronic dyspnea who presents to the ED with  shortness of breath beyond her baseline, cough and fatigue which became more apparent after taking a walk with her husband on the day of arrival. ?  ?Clinical Impression ? Pt admitted with above diagnosis. Pt received seated in bed endorsing need to use bathroom. On RA with Spo2 at 100% upon entry. At baseline pt reports she is indep with ADL's/IADL's as long as she "takes my time" with use of rollator for community distances due to baseline SOB requiring seated rest. Pt mod-I with bed mobility and transfers. Supervision provided to amb to rest room with pt indep performing continent void of bladder, perihygiene and hand hygiene. Pt tolerating ambulation of 200' varying from bouts of supervision to independent. Supervision required due to x1 bout of minor unsteadiness but no LOB throughout gait appreciated and no need for PT assist. Minor SOB noted that improves with seated rest on ED stretcher. Pt educated on energy conservation techniques for household ADL's and community tasks. Pt is near if not at baseline level of function. No acute PT needs observed at this time. Plan to rec [facility] PT at discharge for improving activity tolerance. PT to sign off.  ?   ? ?Recommendations for follow up therapy are one component of a multi-disciplinary discharge planning process, led by the attending physician.  Recommendations may be updated based on patient status, additional functional criteria and insurance authorization. ? ?Follow Up Recommendations Home health PT ? ?  ?Assistance Recommended at Discharge PRN  ?Patient can return home with the following ? Assist for  transportation ? ?  ?Equipment Recommendations None recommended by PT  ?Recommendations for Other Services ?    ?  ?Functional Status Assessment Patient has had a recent decline in their functional status and demonstrates the ability to make significant improvements in function in a reasonable and predictable amount of time.  ? ?  ?Precautions / Restrictions Precautions ?Precautions: Fall ?Precaution Comments: low falls risk ?Restrictions ?Weight Bearing Restrictions: No  ? ?  ? ?Mobility ? Bed Mobility ?Overal bed mobility: Modified Independent ?  ?  ?  ?  ?  ?  ?  ?Patient Response: Cooperative ? ?Transfers ?Overall transfer level: Needs assistance ?Equipment used: None ?Transfers: Sit to/from Stand ?Sit to Stand: Supervision ?  ?  ?  ?  ?  ?  ?  ? ?Ambulation/Gait ?Ambulation/Gait assistance: Supervision ?Gait Distance (Feet): 220 Feet ?Assistive device: Straight cane ?Gait Pattern/deviations: Step-through pattern ?  ?  ?  ?General Gait Details: Minor unsteadiness noted with step through pattern. No overt LOB. ? ?Stairs ?  ?  ?  ?  ?  ? ?Wheelchair Mobility ?  ? ?Modified Rankin (Stroke Patients Only) ?  ? ?  ? ?Balance Overall balance assessment: Needs assistance ?Sitting-balance support: No upper extremity supported, Feet supported ?Sitting balance-Leahy Scale: Good ?Sitting balance - Comments: Able to anteriorly trunk lean to floor to don shoes ?  ?Standing balance support: No upper extremity supported ?Standing balance-Leahy Scale: Fair ?Standing balance comment: Maintains static standing balance without need for AD or UE support ?  ?  ?  ?  ?  ?  ?  ?  ?  ?  ?  ?   ? ? ? ?  Pertinent Vitals/Pain Pain Assessment ?Pain Assessment: No/denies pain  ? ? ?Home Living Family/patient expects to be discharged to:: Private residence ?Living Arrangements: Spouse/significant other ?Available Help at Discharge: Family;Available 24 hours/day ?Type of Home: House ?Home Access: Level entry ?  ?  ?  ?Home Layout: One  level ?Home Equipment: Rollator (4 wheels);Other (comment) (walking stick) ?   ?  ?Prior Function Prior Level of Function : Independent/Modified Independent ?  ?  ?  ?  ?  ?  ?Mobility Comments: uses rollator for community distances ?  ?  ? ? ?Hand Dominance  ?   ? ?  ?Extremity/Trunk Assessment  ? Upper Extremity Assessment ?Upper Extremity Assessment: Defer to OT evaluation ?  ? ?Lower Extremity Assessment ?Lower Extremity Assessment: Overall WFL for tasks assessed ?  ? ?Cervical / Trunk Assessment ?Cervical / Trunk Assessment: Normal  ?Communication  ? Communication: No difficulties  ?Cognition Arousal/Alertness: Awake/alert ?Behavior During Therapy: [facility] for tasks assessed/performed ?Overall Cognitive Status: Within Functional Limits for tasks assessed ?  ?  ?  ?  ?  ?  ?  ?  ?  ?  ?  ?  ?  ?  ?  ?  ?  ?  ?  ? ?  ?General Comments General comments (skin integrity, edema, etc.): SPO2 maintained >/= 94% on RA. HR flucuating from 86 BPM-95 BPM with mobility. ? ?  ?Exercises Other Exercises ?Other Exercises: Role of PT in acute setting, d/c recs, energy conservation.  ? ?Assessment/Plan  ?  ?PT Assessment Patient does not need any further PT services  ?PT Problem List   ? ?   ?  ?PT Treatment Interventions     ? ?PT Goals (Current goals can be found in the Care Plan section)  ?Acute Rehab PT Goals ?Patient Stated Goal: to go home ?PT Goal Formulation: With patient/family ?Time For Goal Achievement: 04/24/21 ?Potential to Achieve Goals: Good ? ?  ?Frequency   ?  ? ? ?Co-evaluation   ?  ?  ?  ?  ? ? ?  ?AM-PAC PT "6 Clicks" Mobility  ?Outcome Measure Help needed turning from your back to your side while in a flat bed without using bedrails?: None ?Help needed moving from lying on your back to sitting on the side of a flat bed without using bedrails?: None ?Help needed moving to and from a bed to a chair (including a wheelchair)?: None ?Help needed standing up from a chair using your arms (e.g., wheelchair or bedside  chair)?: None ?Help needed to walk in hospital room?: A Little ?Help needed climbing 3-5 steps with a railing? : A Little ?6 Click Score: 22 ? ?  ?End of Session   ?Activity Tolerance: Patient tolerated treatment well ?Patient left: in bed;with nursing/sitter in room;with family/visitor present ?Nurse Communication: Mobility status ?PT Visit Diagnosis: Unsteadiness on feet (R26.81) ?  ? ?Time: 8850-2774 ?PT Time Calculation (min) (ACUTE ONLY): 17 min ? ? ?Charges:   PT Evaluation ?$PT Eval Low Complexity: 1 Low ?PT Treatments ?$Therapeutic Activity: 8-22 mins ?  ?   ? ? ?Salem Caster. Fairly IV, PT, DPT ?Physical Therapist- [place]  ?[facility]  ?04/10/2021, 11:01 AM ? ?

## 2021-04-10 NOTE — Progress Notes (Signed)
Nutrition Brief Note ? ?RD consulted for assessment of nutritional requirements/ status.  ? ?Wt Readings from Last 15 Encounters:  ?04/09/21 77.1 kg  ?04/06/21 77.1 kg  ?03/23/21 74.8 kg  ?01/23/21 73.9 kg  ?01/16/21 75.1 kg  ?11/03/20 76.5 kg  ?07/21/20 76.5 kg  ?07/18/20 75.8 kg  ?05/12/20 77.1 kg  ?03/18/20 77.1 kg  ?01/19/20 76.9 kg  ?09/18/19 77 kg  ?07/14/19 77.6 kg  ?07/07/19 78.7 kg  ?02/20/19 78.5 kg  ? ?Christina [name] is a 86 y.o. female with medical history significant for CAD s/p CABG x 3, severe tricuspid regurg on echo 2019, COPD, HTN CKD 3a, PAD, chronic dyspnea who presents to the ED with  shortness of breath beyond her baseline, cough and fatigue which became more apparent after taking a walk with her husband on the day of arrival.    Also has associated chest discomfort when coughing.  She denies fever or chills.  Denies associated contacts.  Reports 1 week of lower extremity edema for which she has been using compression stockings .  Review of past records however reveals longstanding history of peripheral edema.  Patient was evaluated by her cardiologist on 02/2321 because of her dyspnea on exertion and her symptoms were  attributed to COPD and OSA. ? ?Pt admitted with COPD exacerbation.  ? ?Reviewed wt hx; wt has been stable over the past 6 months.  ? ?RD liberalize diet to increase variety of meal selections.  ? ?Medications reviewed and include solu-medrol.  ? ?Labs reviewed. ? ?Current diet order is Heart Healthy, patient is consuming approximately n/a% of meals at this time. Labs and medications reviewed.  ? ?No nutrition interventions warranted at this time. If nutrition issues arise, please consult RD.  ? ?Loistine Chance, RD, LDN, CDCES ?Registered Dietitian II ?Certified Diabetes Care and Education Specialist ?Please refer to [facility] for RD and/or RD on-call/weekend/after hours pager   ?

## 2021-04-10 NOTE — Assessment & Plan Note (Signed)
Continue PPI ?

## 2021-04-10 NOTE — ED Notes (Signed)
Patient assisted to the bathroom with minimal assistance. Patient ambulated back to bed, placed her back on monitoring equipment, and positioned her for comfort. Patient has no other needs or complaints at this time. ?

## 2021-04-10 NOTE — Patient Instructions (Signed)
Visit Information It was great speaking with you today!  Please let me know if you have any questions about our visit.   Goals Addressed             This Visit's Progress    Track and Manage My Triggers-COPD       Timeframe:  Long-Range Goal Priority:  High Start Date: 04/03/2021                           Expected End Date: 04/03/2022                      Follow Up within 90 days   - eliminate smoking in my home - identify and remove indoor air pollutants - limit outdoor activity during cold weather    Why is this important?   Triggers are activities or things, like tobacco smoke or cold weather, that make your COPD (chronic obstructive pulmonary disease) flare-up.  Knowing these triggers helps you plan how to stay away from them.  When you cannot remove them, you can learn how to manage them.     Notes:         Patient Care Plan: General Pharmacy (Adult)     Problem Identified: Hypertension, Hyperlipidemia, GERD, COPD, Chronic Kidney Disease, and Osteopenia   Priority: High     Long-Range Goal: Patient-Specific Goal   Start Date: 04/10/2021  Expected End Date: 04/11/2022  This Visit's Progress: On track  Priority: High  Note:   Current Barriers:  Unable to self administer medications as prescribed  Pharmacist Clinical Goal(s):  Patient will maintain control of blood pressure as evidenced by BP less than 140/90  through collaboration with PharmD and provider.   Interventions: 1:1 collaboration with [name], FNP regarding development and update of comprehensive plan of care as evidenced by provider attestation and co-signature Inter-disciplinary care team collaboration (see longitudinal plan of care) Comprehensive medication review performed; medication list updated in electronic medical record  Hypertension (BP goal <140/90) -Controlled -Current treatment: Amlodipine 10 mg daily: Appropriate, Effective, Safe, Accessible HCTZ 12.5 mg daily: Appropriate,  Effective, Safe, Accessible  Metoprolol tartrate 50 mg twice daily: Appropriate, Effective, Safe, Accessible  -Medications previously tried: NA  -Current home readings:   131/74; Pulse 70 139/60; Pulse 81 141/79; Pulse 76 -Denies hypotensive/hypertensive symptoms -Reports some swelling in legs and ankles. Compression stockings help.  -Recommended to continue current medication  Hyperlipidemia: (LDL goal < 70) -Uncontrolled -History of PAD, PVD, CAD s/p CABG x 3 (2006)  -Current treatment: Rosuvastatin 20 mg daily: Appropriate, Effective, Safe, Accessible  -Current antiplatelet treatment: Aspirin 81 mg daily: Appropriate, Effective, Safe, Accessible  -Medications previously tried: Simvastatin  -Recommended to continue current medication  COPD (Goal: control symptoms and prevent exacerbations) -Controlled -Current treatment  ProAir HFA 2 puffs every 6 hours as needed: Appropriate, Effective, Safe, Accessible  Symbicort 1 puff twice daily : Appropriate, Query Effective -Medications previously tried: NA  -Patient denies consistent use of maintenance inhaler -Frequency of rescue inhaler use: 1-2x weekly  -Recommended to continue current medication  Osteoarthritis (Goal: Prevent inflammation) -Controlled -Current treatment  Acetaminophen 500 mg every 8 hours as needed -Medications previously tried: NA  -Recommended to continue current medication   Patient Goals/Self-Care Activities Patient will:  - check blood pressure weekly, document, and provide at future appointments  Follow Up Plan: Telephone follow up appointment with care management team member scheduled for:  06/30/2021 at 8:30  AM    Christina [name] was given information about Chronic Care Management services today including:  CCM service includes personalized support from designated clinical staff supervised by her physician, including individualized plan of care and coordination with other care providers 24/7 contact  phone numbers for assistance for urgent and routine care needs. Standard insurance, coinsurance, copays and deductibles apply for chronic care management only during months in which we provide at least 20 minutes of these services. Most insurances cover these services at 100%, however patients may be responsible for any copay, coinsurance and/or deductible if applicable. This service may help you avoid the need for more expensive face-to-face services. Only one practitioner may furnish and bill the service in a calendar month. The patient may stop CCM services at any time (effective at the end of the month) by phone call to the office staff.  Patient agreed to services and verbal consent obtained.   The patient verbalized understanding of instructions, educational materials, and care plan provided today and declined offer to receive copy of patient instructions, educational materials, and care plan.   Junius Argyle, PharmD, Para [place], CPP  Clinical Pharmacist Practitioner  [facility] [phone]

## 2021-04-10 NOTE — Progress Notes (Signed)
PHARMACIST - PHYSICIAN COMMUNICATION ? ?CONCERNING: Antibiotic IV to Oral Route Change Policy ? ?RECOMMENDATION: ?This patient is receiving azithromycin by the intravenous route.  Based on criteria approved by the Pharmacy and Therapeutics Committee, the antibiotic(s) is/are being converted to the equivalent oral dose form(s). ? ? ?DESCRIPTION: ?These criteria include: ?Patient being treated for a respiratory tract infection, urinary tract infection, cellulitis or clostridium difficile associated diarrhea if on metronidazole ?The patient is not neutropenic and does not exhibit a GI malabsorption state ?The patient is eating (either orally or via tube) and/or has been taking other orally administered medications for a least 24 hours ?The patient is improving clinically and has a Tmax < 100.5 ? ?If you have questions about this conversion, please contact the Pharmacy Department  ? ?Benita Gutter  ?04/10/21  ?

## 2021-04-10 NOTE — Assessment & Plan Note (Addendum)
On previous imaging studies the patient does have a hiatal hernia with part of the stomach in the right chest.  This may be contributing to the patient's atrial fibrillation.  Case discussed with Dr. Dahlia Byes general surgery yesterday and he will follow-up as outpatient. ?

## 2021-04-11 DIAGNOSIS — R7301 Impaired fasting glucose: Secondary | ICD-10-CM

## 2021-04-11 LAB — CBC
HCT: 36.6 % (ref 36.0–46.0)
Hemoglobin: 11.7 g/dL — ABNORMAL LOW (ref 12.0–15.0)
MCH: 33 pg (ref 26.0–34.0)
MCHC: 32 g/dL (ref 30.0–36.0)
MCV: 103.1 fL — ABNORMAL HIGH (ref 80.0–100.0)
Platelets: 195 10*3/uL (ref 150–400)
RBC: 3.55 MIL/uL — ABNORMAL LOW (ref 3.87–5.11)
RDW: 13.5 % (ref 11.5–15.5)
WBC: 7.6 10*3/uL (ref 4.0–10.5)
nRBC: 0 % (ref 0.0–0.2)

## 2021-04-11 LAB — BASIC METABOLIC PANEL
Anion gap: 11 (ref 5–15)
BUN: 29 mg/dL — ABNORMAL HIGH (ref 8–23)
CO2: 25 mmol/L (ref 22–32)
Calcium: 9 mg/dL (ref 8.9–10.3)
Chloride: 107 mmol/L (ref 98–111)
Creatinine, Ser: 1.04 mg/dL — ABNORMAL HIGH (ref 0.44–1.00)
GFR, Estimated: 53 mL/min — ABNORMAL LOW (ref >60)
Glucose, Bld: 152 mg/dL — ABNORMAL HIGH (ref 70–99)
Potassium: 4.5 mmol/L (ref 3.5–5.1)
Sodium: 143 mmol/L (ref 135–145)

## 2021-04-11 LAB — HIV ANTIBODY (ROUTINE TESTING W REFLEX): HIV Screen 4th Generation wRfx: NONREACTIVE

## 2021-04-11 LAB — PROCALCITONIN: Procalcitonin: <0.1 ng/mL

## 2021-04-11 MED ORDER — ALBUTEROL SULFATE HFA 108 (90 BASE) MCG/ACT IN AERS: 2.0000 | INHALATION_SPRAY | Freq: Four times a day (QID) | RESPIRATORY_TRACT | 0 refills | Status: DC | PRN

## 2021-04-11 MED ORDER — METOPROLOL TARTRATE 25 MG PO TABS: 25.0000 mg | ORAL_TABLET | Freq: Two times a day (BID) | ORAL | Status: DC

## 2021-04-11 MED ORDER — POLYETHYLENE GLYCOL 3350 17 G PO PACK
17.0000 g | PACK | Freq: Every day | ORAL | Status: DC
  Administered 2021-04-11: 17 g via ORAL
  Filled 2021-04-11: qty 1

## 2021-04-11 MED ORDER — AZITHROMYCIN 250 MG PO TABS: ORAL_TABLET | ORAL | 0 refills | Status: DC

## 2021-04-11 MED ORDER — CEFDINIR 300 MG PO CAPS: 300.0000 mg | ORAL_CAPSULE | Freq: Two times a day (BID) | ORAL | 0 refills | Status: AC

## 2021-04-11 MED ORDER — PREDNISONE 20 MG PO TABS
ORAL_TABLET | ORAL | 0 refills | Status: DC
Start: 2021-04-12 — End: 2021-05-01

## 2021-04-11 MED ORDER — METOPROLOL TARTRATE 75 MG PO TABS: 75.0000 mg | ORAL_TABLET | Freq: Two times a day (BID) | ORAL | 0 refills | Status: DC

## 2021-04-11 MED ORDER — METOPROLOL TARTRATE 50 MG PO TABS: 50.0000 mg | ORAL_TABLET | Freq: Two times a day (BID) | ORAL | 0 refills | Status: DC

## 2021-04-11 NOTE — Progress Notes (Addendum)
[place] NOTE       Patient ID: Christina [name] MRN: [id number] DOB/AGE: August 04, 1935 86 y.o.  Admit date: 04/09/2021 Referring Physician Dr. Judd Gaudier Primary Physician [name], PA-C Primary Cardiologist Dr. [name] Reason for Consultation AF  HPI: The patient is an 86 year old female with a past medical history notable for CAD s/p CABG x 3, COPD, hypertension, OSA on CPAP, hyperlipidemia, GERD presented to [facility] ED 04/09/2021 with SOB worse from her baseline with cough and fatigue.  Interval History: -patient continues to admit to exertional dyspnea, however she has not required supplemental O2 overnight. Admits to fleeting palpitations, denies chest pain or LEE. -refused eliquis this morning because she is worried about bleeding. Declines further doses.    Review of systems complete and found to be negative unless listed above     Past Medical History:  Diagnosis Date   Arthritis    COPD (chronic obstructive pulmonary disease) (HCC)    Coronary artery disease    GERD (gastroesophageal reflux disease)    Hyperlipidemia    Hypertension    Macular degeneration    PAD (peripheral artery disease) ([place])    Sleep apnea     Past Surgical History:  Procedure Laterality Date   ABDOMINAL HYSTERECTOMY  1980   due to dysfunctional uterine bleeding   APPENDECTOMY  1980   BREAST SURGERY Left 2000   biopsy   CARDIAC CATHETERIZATION     COLONOSCOPY     CORONARY ARTERY BYPASS GRAFT  2006   HEMORRHOID SURGERY     LOWER EXTREMITY ANGIOGRAPHY Right 02/25/2017   Procedure: LOWER EXTREMITY ANGIOGRAPHY;  Surgeon: Algernon Huxley, MD;  Location: [place] [facility];  Service: Cardiovascular;  Laterality: Right;   LOWER EXTREMITY INTERVENTION  02/25/2017   Procedure: LOWER EXTREMITY INTERVENTION;  Surgeon: Algernon Huxley, MD;  Location: [place] [facility];  Service: Cardiovascular;;   LUNG BIOPSY  1999   Negative   TONSILLECTOMY      Medications Prior to  Admission  Medication Sig Dispense Refill Last Dose   amLODipine (NORVASC) 10 MG tablet Take 10 mg by mouth daily.   04/09/2021 at 0900   Ascorbic Acid (VITAMIN C) 1000 MG tablet Take 1,000 mg by mouth daily.   04/09/2021 at 0900   aspirin 81 MG tablet Take 81 mg by mouth daily.    04/09/2021 at 0900   budesonide-formoterol (SYMBICORT) 160-4.5 MCG/ACT inhaler TAKE 1 PUFF BY MOUTH TWICE A DAY 10.2 each 3 04/09/2021 at 0900   Calcium Carb-Cholecalciferol (CALCIUM 600+D3 PO) Take 1 tablet by mouth daily.   04/09/2021 at 0900   Coenzyme Q10 (COQ-10) 100 MG capsule Take 1 capsule (100 mg total) by mouth daily. 30 capsule 1 04/09/2021 at 0900   hydrochlorothiazide (HYDRODIURIL) 12.5 MG tablet Take 12.5 mg by mouth daily.   04/09/2021 at 0900   metoprolol tartrate (LOPRESSOR) 25 MG tablet Take 1 tablet (25 mg total) by mouth 2 (two) times daily. 180 tablet 3 04/09/2021 at 0900   Multiple Vitamin (MULTI-VITAMIN DAILY PO) Take 1 tablet by mouth daily.   04/09/2021 at 0900   pantoprazole (PROTONIX) 40 MG tablet TAKE 1 TABLET (40 MG TOTAL) BY MOUTH ONCE DAILY TAKE 30 MINUTES BEFORE BREAKFAST. 90 tablet 3 04/09/2021 at 0900   rosuvastatin (CRESTOR) 20 MG tablet Take 1 tablet (20 mg total) by mouth daily. 90 tablet 3 04/09/2021 at 0900   tobramycin (TOBREX) 0.3 % ophthalmic solution   5 04/09/2021   acetaminophen (TYLENOL) 500  MG tablet Take 500 mg by mouth every 8 (eight) hours as needed for mild pain or moderate pain.   prn at unknown   azelastine (ASTELIN) 0.1 % nasal spray Place 1 spray into both nostrils daily as needed.   prn at unknown   carboxymethylcellulose (REFRESH PLUS) 0.5 % SOLN 1 drop 3 (three) times daily as needed.   prn at unknown   docusate sodium (COLACE) 100 MG capsule Take 200 mg by mouth at bedtime.   04/08/2021 at 2100   [DISCONTINUED] albuterol (VENTOLIN HFA) 108 (90 Base) MCG/ACT inhaler Inhale 2 puffs into the lungs every 6 (six) hours as needed for wheezing or shortness of breath. 18 g 3 prn at unknown     Social History   Socioeconomic History   Marital status: Married    Spouse name: Cliffton J Baglio   Number of children: 2   Years of education: 12   Highest education level: 12th grade  Occupational History   Occupation: retired  Tobacco Use   Smoking status: Former    Packs/day: 0.50    Years: 5.00    Pack years: 2.50    Types: Cigarettes    Quit date: 04/24/2004    Years since quitting: 16.9   Smokeless tobacco: Never  Vaping Use   Vaping Use: Never used  Substance and Sexual Activity   Alcohol use: No   Drug use: No   Sexual activity: Yes  Other Topics Concern   Not on file  Social History Narrative   Not on file   Social Determinants of Health   Financial Resource Strain: Low Risk    Difficulty of Paying Living Expenses: Not hard at all  Food Insecurity: Not on file  Transportation Needs: Not on file  Physical Activity: Not on file  Stress: Not on file  Social Connections: Not on file  Intimate Partner Violence: Not on file    Family History  Problem Relation Age of Onset   Hypertension Mother    Hyperlipidemia Mother    Alzheimer's disease Mother    CAD Mother    Heart attack Father    Lung disease Sister    Heart disease Sister    Diabetes Paternal Grandfather        Type 2   Hearing loss Son       Review of systems complete and found to be negative unless listed above    PHYSICAL EXAM General: Elderly Caucasian female, well nourished, in no acute distress.  Seen ambulating without difficulty in PCU room. HEENT:  Normocephalic and atraumatic. Neck:  No JVD.  Lungs: Normal respiratory effort on room air. Clear bilaterally to auscultation. No wheezes, crackles, rhonchi.  Heart: Irregularly irregular rhythm with controlled rate. Normal S1 and S2 without gallops or murmurs. Radial & DP pulses 2+ bilaterally. Abdomen: Non-distended appearing.  Msk: Normal strength and tone for age. Extremities: Warm and well perfused. No clubbing, cyanosis.  Trace bilateral LE peripheral edema.  Neuro: Alert and oriented X 3. Psych:  Answers questions appropriately.   Labs:   Lab Results  Component Value Date   WBC 7.6 04/11/2021   HGB 11.7 (L) 04/11/2021   HCT 36.6 04/11/2021   MCV 103.1 (H) 04/11/2021   PLT 195 04/11/2021    Recent Labs  Lab 04/11/21 0602  NA 143  K 4.5  CL 107  CO2 25  BUN 29*  CREATININE 1.04*  CALCIUM 9.0  GLUCOSE 152*    No results found for: CKTOTAL, CKMB,  [facility], TROPONINI  Lab Results  Component Value Date   CHOL 159 03/18/2020   CHOL 164 02/20/2019   CHOL 165 02/17/2018   Lab Results  Component Value Date   HDL 59 03/18/2020   HDL 54 02/20/2019   HDL 55 02/17/2018   Lab Results  Component Value Date   LDLCALC 75 03/18/2020   LDLCALC 73 02/20/2019   LDLCALC 74 02/17/2018   Lab Results  Component Value Date   TRIG 143 03/18/2020   TRIG 227 (H) 02/20/2019   TRIG 178 (H) 02/17/2018   Lab Results  Component Value Date   CHOLHDL 3.0 02/20/2019   CHOLHDL 3.0 02/17/2018   CHOLHDL 2.9 02/07/2017   No results found for: LDLDIRECT    Radiology: DG Chest 2 View  Result Date: 04/09/2021 CLINICAL DATA:  Shortness of breath. EXAM: CHEST - 2 VIEW COMPARISON:  Radiograph 07/09/2019 FINDINGS: Patient is post median sternotomy and CABG. Heart size is stable, partially obscured by large hiatal hernia. Aortic atherosclerosis. Hiatal hernia projecting to the right of the heart with air-fluid level. There is increasing opacity at the right lung base with right pleural effusion. Trace left pleural effusion. Chronic bronchial thickening. Calcified granuloma in the left mid lung. No pneumothorax. IMPRESSION: 1. Increasing opacity at the right lung base with right pleural effusion, may be compressive atelectasis related to large hiatal hernia or airspace disease/pneumonia. 2. Trace left pleural effusion. 3. Large hiatal hernia. Electronically Signed   By: Keith Rake M.D.   On: 04/09/2021 21:15    ECHOCARDIOGRAM COMPLETE  Result Date: 04/10/2021    ECHOCARDIOGRAM REPORT   Patient Name:   Christina [name] Date of Exam: 04/10/2021 Medical Rec #:  [id number]           Height:       65.0 in Accession #:    [id number]          Weight:       170.0 lb Date of Birth:  10/05/1935           BSA:          1.846 m Patient Age:    55 years            BP:           151/92 mmHg Patient Gender: F                   HR:           93 bpm. Exam Location:  [facility] Procedure: 2D Echo, Color Doppler and Cardiac Doppler Indications:     I50.31 congestive heart failure-Acute Diastolic  History:         Patient has no prior history of Echocardiogram examinations.                  CAD, COPD and PAD, Arrythmias:Atrial Fibrillation; Risk                  Factors:Hypertension, Dyslipidemia and Sleep Apnea.  Sonographer:     Charmayne Sheer Referring Phys:  [id number] Athena Masse Diagnosing Phys: Donnelly Angelica IMPRESSIONS  1. Left ventricular ejection fraction, by estimation, is 50 to 55%. The left ventricle has low normal function. The left ventricle has no regional wall motion abnormalities. There is mild left ventricular hypertrophy. Left ventricular diastolic parameters are indeterminate.  2. Right ventricular systolic function is mildly reduced. The right ventricular size is mildly enlarged.  3. Left atrial size was moderately dilated.  4.  Right atrial size was moderately dilated.  5. The mitral valve is normal in structure. Mild to moderate mitral valve regurgitation. No evidence of mitral stenosis.  6. Tricuspid valve regurgitation is severe.  7. The aortic valve is normal in structure. Aortic valve regurgitation is not visualized. No aortic stenosis is present. FINDINGS  Left Ventricle: Left ventricular ejection fraction, by estimation, is 50 to 55%. The left ventricle has low normal function. The left ventricle has no regional wall motion abnormalities. The left ventricular internal cavity size was normal in size. There is mild left  ventricular hypertrophy. Left ventricular diastolic parameters are indeterminate. Right Ventricle: The right ventricular size is mildly enlarged. No increase in right ventricular wall thickness. Right ventricular systolic function is mildly reduced. Left Atrium: Left atrial size was moderately dilated. Right Atrium: Right atrial size was moderately dilated. Pericardium: There is no evidence of pericardial effusion. Mitral Valve: The mitral valve is normal in structure. Mild to moderate mitral valve regurgitation. No evidence of mitral valve stenosis. MV peak gradient, 5.2 mmHg. The mean mitral valve gradient is 2.0 mmHg. Tricuspid Valve: The tricuspid valve is normal in structure. Tricuspid valve regurgitation is severe. Aortic Valve: The aortic valve is normal in structure. Aortic valve regurgitation is not visualized. No aortic stenosis is present. Aortic valve mean gradient measures 3.0 mmHg. Aortic valve peak gradient measures 4.8 mmHg. Aortic valve area, by VTI measures 1.53 cm. Pulmonic Valve: The pulmonic valve was not well visualized. Pulmonic valve regurgitation is not visualized. Aorta: The aortic root is normal in size and structure. Venous: Unclear if IVC visualized versus abdominal aorta. The inferior vena cava was not well visualized. IAS/Shunts: No atrial level shunt detected by color flow Doppler.  LEFT VENTRICLE PLAX 2D LVIDd:         4.01 cm   Diastology LVIDs:         3.35 cm   LV e' medial:    12.00 cm/s LV PW:         1.30 cm   LV E/e' medial:  7.6 LV IVS:        1.00 cm   LV e' lateral:   12.90 cm/s LVOT diam:     1.80 cm   LV E/e' lateral: 7.1 LV SV:         36 LV SV Index:   19 LVOT Area:     2.54 cm  RIGHT VENTRICLE RV Basal diam:  4.19 cm LEFT ATRIUM             Index        RIGHT ATRIUM           Index LA diam:        4.60 cm 2.49 cm/m   RA Area:     22.90 cm LA Vol (A2C):   50.5 ml 27.35 ml/m  RA Volume:   64.50 ml  34.94 ml/m LA Vol (A4C):   91.2 ml 49.40 ml/m LA Biplane Vol: 74.3  ml 40.25 ml/m  AORTIC VALVE                    PULMONIC VALVE AV Area (Vmax):    1.68 cm     PV Vmax:       0.89 m/s AV Area (Vmean):   1.52 cm     PV Vmean:      60.000 cm/s AV Area (VTI):     1.53 cm     PV VTI:  0.174 m AV Vmax:           110.00 cm/s  PV Peak grad:  3.2 mmHg AV Vmean:          82.300 cm/s  PV Mean grad:  2.0 mmHg AV VTI:            0.235 m AV Peak Grad:      4.8 mmHg AV Mean Grad:      3.0 mmHg LVOT Vmax:         72.50 cm/s LVOT Vmean:        49.300 cm/s LVOT VTI:          0.141 m LVOT/AV VTI ratio: 0.60  AORTA Ao Root diam: 3.10 cm MITRAL VALVE               TRICUSPID VALVE MV Area (PHT): 3.39 cm    TR Peak grad:   30.9 mmHg MV Area VTI:   2.30 cm    TR Vmax:        278.00 cm/s MV Peak grad:  5.2 mmHg MV Mean grad:  2.0 mmHg    SHUNTS MV Vmax:       1.14 m/s    Systemic VTI:  0.14 m MV Vmean:      67.6 cm/s   Systemic Diam: 1.80 cm MV Decel Time: 224 msec MV E velocity: 91.45 cm/s Donnelly Angelica Electronically signed by Donnelly Angelica Signature Date/Time: 04/10/2021/10:53:13 AM    Final     ECHO  LVEF 50-55% with biatrial enlargement, mild to moderate MR, severe TR (similar to prior from 06/2020)  TELEMETRY reviewed by me: Atrial fibrillation with rate peaking this morning around 120, back to 90 or so during interview.  EKG reviewed by me: A-fib rate 69  ASSESSMENT AND PLAN:  The patient is an 86 year old female with a past medical history notable for CAD s/p CABG x 3, COPD, hypertension, OSA on CPAP, hyperlipidemia, GERD presented to [facility] ED 04/09/2021 with SOB worse from her baseline with cough and fatigue.  #Paroxysmal atrial fibrillation #CAP #COPD The patient presents with 1 week of cough and worsening dyspnea on exertion from her baseline, and severe fatigue.  Chest x-ray showed possible pneumonia in the right lower lung.  She feels better after receiving IV Solu-Medrol and DuoNeb treatments and was started on ceftriaxone and azithromycin and is off supplemental oxygen.  She  was seen by her PCP 04/06/2021 where her EKG showed A-fib with controlled rate, and on admission shows A-fib with rate of 69 and has remained rate controlled while she has been hospitalized so far.  She denies chest discomfort, palpitations this morning and remains rate controlled on metoprolol. -Agree with current therapy -continue metoprolol tartrate for rate control, increase dose at discharge to '75mg'$  BID -After a long discussion with patient, she changed her mind about starting eliquis, citing anxiety about potential for bleeding. She is aware that her risk of stroke per year is 7.2% (CHADSVASC 5). She politely declines eliquis. Encouraged further discussions about it with Dr. [name] in the future. -she will need close follow-up with her regular cardiologist Dr. [name] in 1 to 2 weeks after discharge. -ok for discharge from a cardiac standpoint today.   #Mild to moderate MR, severe TR #CAD s/p CABG x3 #Hypertension #Hyperlipidemia -Continue simvastatin and aspirin '81mg'$  daily  -continue HCTZ 12.'5mg'$  -discontinue amlodipine d/t history of leg swelling while on it  This patient's plan of care was discussed and created with Dr. Donnelly Angelica and  he is in agreement.  Signed: Tristan Schroeder , PA-C 04/11/2021, 11:16 AM [facility]

## 2021-04-11 NOTE — Assessment & Plan Note (Signed)
Sugars a little bit elevated secondary to steroids.  Continue to monitor as outpatient. ?

## 2021-04-11 NOTE — TOC Transition Note (Signed)
Transition of Care (TOC) - CM/SW Discharge Note ? ? ?Patient Details  ?Name: Christina [name] ?MRN: [id number] ?Date of Birth: 12/24/1935 ? ?Transition of Care (TOC) CM/SW Contact:  ?Alberteen Sam, LCSW ?Phone Number: ?04/11/2021, 9:50 AM ? ? ?Clinical Narrative:    ? ?Patient to discharge home today.  ? ?Patient is from home with her husband.  She is independent she does have a rollator.  ? ?Patient declined home health services.  ? ?Patient's husband will pick her up at discharge.  ? ?No further dc needs identified at this time.  ? ?Final next level of care: Home/Self Care ?Barriers to Discharge: No Barriers Identified ? ? ?Patient Goals and CMS Choice ?Patient states their goals for this hospitalization and ongoing recovery are:: to go home ?CMS Medicare.gov Compare Post Acute Care list provided to:: Patient ?Choice offered to / list presented to : Patient ? ?Discharge Placement ?  ?           ?  ?Patient to be transferred to facility by: spouse ?Name of family member notified: spouse ?Patient and family notified of of transfer: 04/11/21 ? ?Discharge Plan and Services ?  ?Discharge Planning Services: CM Consult ?Post Acute Care Choice: NA          ?DME Arranged: N/A ?DME Agency: NA ?  ?  ?  ?HH Arranged: Patient Refused HH ?  ?  ?  ?  ? ?Social Determinants of Health (SDOH) Interventions ?  ? ? ?Readmission Risk Interventions ?Readmission Risk Prevention Plan 04/10/2021  ?Transportation Screening Complete  ?PCP or Specialist Appt within 5-7 Days Complete  ?Home Care Screening Patient refused  ?Medication Review (RN CM) Complete  ?Some recent data might be hidden  ? ? ? ? ? ?

## 2021-04-11 NOTE — Discharge Summary (Signed)
Physician Discharge Summary   Patient: Christina [name] MRN: [id number] DOB: 1935/07/02  Admit date:     04/09/2021  Discharge date: 04/11/21  Discharge Physician: Loletha Grayer   PCP: [name], FNP   Recommendations at discharge:   Follow-up PCP Follow-up Dr. Dahlia Byes general surgery for hiatal hernia with stomach in the chest cavity. Follow-up cardiology  Discharge Diagnoses: Principal Problem:   Pneumonia Active Problems:   COPD with acute exacerbation (HCC)   Atrial fibrillation ([place])   Hiatal hernia   Acid reflux   Coronary artery disease   Stage 3a chronic kidney disease (HCC)   PAD (peripheral artery disease) (HCC)   Impaired fasting glucose   Hospital Course: The patient was admitted to the hospital on 04/09/2021 and discharged on 04/11/2021.  She was brought in with pneumonia and COPD exacerbation and atrial fibrillation.  In reviewing prior radiological data she does have a hiatal hernia with stomach in the right chest.  This may be contributing to her symptoms of shortness of breath.  I spoke with Dr. Dahlia Byes general surgery and he will follow-up as outpatient.  The patient ambulated in the hallway and held her saturations.  Patient was stable for discharge home.  Patient was empirically given antibiotic and steroid.  Assessment and Plan: * Pneumonia Possible pneumonia.  Patient started on Rocephin and Zithromax.  Procalcitonin negative.  Will complete a quick course of Omnicef and Zithromax upon disposition home.  White blood cell count normal.  COPD with acute exacerbation ([place]) Patient was given Solu-Medrol during the hospital course and switch over to prednisone.  I will give prednisone for couple more days upon disposition.   Atrial fibrillation (HCC) Increase metoprolol 75 mg twice a day.  Cardiology recommended Eliquis for anticoagulation but the patient refused.  I explained to her that with atrial fibrillation without a blood thinner we have a higher risk  of stroke.  She understands the risk.  EF normal with mild to moderate MR and severe TR.  Hiatal hernia On previous imaging studies the patient does have a hiatal hernia with part of the stomach in the right chest.  This may be contributing to the patient's atrial fibrillation.  Case discussed with Dr. Dahlia Byes general surgery yesterday and he will follow-up as outpatient.  Acid reflux Continue PPI  Impaired fasting glucose Sugars a little bit elevated secondary to steroids.  Continue to monitor as outpatient.  PAD (peripheral artery disease) (HCC) Continue aspirin and rosuvastatin  Stage 3a chronic kidney disease ([place]) Creatinine 1.23 on coming in and 1.04 currently GFR still 53         Consultants: Cardiology Procedures performed: None Disposition: Home Diet recommendation:  Cardiac DISCHARGE MEDICATION: Allergies as of 04/11/2021       Reactions   Baclofen Other (See Comments)   Codeine Nausea Only   Plavix [clopidogrel]    brusing all over   Sulfa Antibiotics Nausea Only   Latex Rash        Medication List     STOP taking these medications    amLODipine 10 MG tablet Commonly known as: NORVASC       TAKE these medications    acetaminophen 500 MG tablet Commonly known as: TYLENOL Take 500 mg by mouth every 8 (eight) hours as needed for mild pain or moderate pain.   albuterol 108 (90 Base) MCG/ACT inhaler Commonly known as: VENTOLIN HFA Inhale 2 puffs into the lungs every 6 (six) hours as needed for wheezing or shortness of  breath.   aspirin 81 MG tablet Take 81 mg by mouth daily.   azelastine 0.1 % nasal spray Commonly known as: ASTELIN Place 1 spray into both nostrils daily as needed.   azithromycin 250 MG tablet Commonly known as: ZITHROMAX One tab po daily for two days Start taking on: April 12, 2021   budesonide-formoterol 160-4.5 MCG/ACT inhaler Commonly known as: Symbicort TAKE 1 PUFF BY MOUTH TWICE A DAY   CALCIUM 600+D3 PO Take 1  tablet by mouth daily.   carboxymethylcellulose 0.5 % Soln Commonly known as: REFRESH PLUS 1 drop 3 (three) times daily as needed.   cefdinir 300 MG capsule Commonly known as: OMNICEF Take 1 capsule (300 mg total) by mouth 2 (two) times daily for 5 doses.   CoQ-10 100 MG capsule Take 1 capsule (100 mg total) by mouth daily.   docusate sodium 100 MG capsule Commonly known as: COLACE Take 200 mg by mouth at bedtime.   hydrochlorothiazide 12.5 MG tablet Commonly known as: HYDRODIURIL Take 12.5 mg by mouth daily.   Metoprolol Tartrate 75 MG Tabs Take 75 mg by mouth 2 (two) times daily. What changed:  medication strength how much to take   MULTI-VITAMIN DAILY PO Take 1 tablet by mouth daily.   pantoprazole 40 MG tablet Commonly known as: PROTONIX TAKE 1 TABLET (40 MG TOTAL) BY MOUTH ONCE DAILY TAKE 30 MINUTES BEFORE BREAKFAST.   predniSONE 20 MG tablet Commonly known as: DELTASONE Two tabs po daily for two days Start taking on: April 12, 2021   rosuvastatin 20 MG tablet Commonly known as: Crestor Take 1 tablet (20 mg total) by mouth daily.   tobramycin 0.3 % ophthalmic solution Commonly known as: TOBREX   vitamin C 1000 MG tablet Take 1,000 mg by mouth daily.        Follow-up Information     [place], [place], MD Follow up on 05/01/2021.   Specialty: General Surgery Why: hiatal hernia follow up @ 1:30pm Contact information: [address] [place] [address] [phone]         Yolonda Kida, MD. Go on 04/28/2021.   Specialties: Cardiology, Internal Medicine Why: @ 10:30am Patient will see the PA Contact information: [place] [address] [phone]         [name], FNP Follow up on 04/17/2021.   Specialty: Family Medicine Why: @ 1:40pm Contact information: [address] [place] [address] [phone]                Discharge Exam: Danley Danker Weights   04/09/21 2027 04/11/21  0402  Weight: 77.1 kg 75.1 kg  Blood pressure 149/78, temperature 97.5, pulse 82, pulse ox 94% on room air Physical Exam HENT:     Head: Normocephalic.     Mouth/Throat:     Pharynx: No oropharyngeal exudate.  Eyes:     General: Lids are normal.     Conjunctiva/sclera: Conjunctivae normal.  Cardiovascular:     Rate and Rhythm: Normal rate. Rhythm irregularly irregular.     Heart sounds: Normal heart sounds, S1 normal and S2 normal.  Pulmonary:     Breath sounds: Examination of the right-lower field reveals decreased breath sounds and rhonchi. Decreased breath sounds and rhonchi present. No wheezing or rales.  Abdominal:     Palpations: Abdomen is soft.     Tenderness: There is no abdominal tenderness.  Musculoskeletal:     Right lower leg: No swelling.     Left  lower leg: No swelling.  Skin:    General: Skin is warm.     Findings: No rash.  Neurological:     Mental Status: She is alert and oriented to person, place, and time.     Condition at discharge: stable  The results of significant diagnostics from this hospitalization (including imaging, microbiology, ancillary and laboratory) are listed below for reference.   Imaging Studies: DG Chest 2 View  Result Date: 04/09/2021 CLINICAL DATA:  Shortness of breath. EXAM: CHEST - 2 VIEW COMPARISON:  Radiograph 07/09/2019 FINDINGS: Patient is post median sternotomy and CABG. Heart size is stable, partially obscured by large hiatal hernia. Aortic atherosclerosis. Hiatal hernia projecting to the right of the heart with air-fluid level. There is increasing opacity at the right lung base with right pleural effusion. Trace left pleural effusion. Chronic bronchial thickening. Calcified granuloma in the left mid lung. No pneumothorax. IMPRESSION: 1. Increasing opacity at the right lung base with right pleural effusion, may be compressive atelectasis related to large hiatal hernia or airspace disease/pneumonia. 2. Trace left pleural effusion. 3.  Large hiatal hernia. Electronically Signed   By: Keith Rake M.D.   On: 04/09/2021 21:15   ECHOCARDIOGRAM COMPLETE  Result Date: 04/10/2021    ECHOCARDIOGRAM REPORT   Patient Name:   ANNAJULIA LEWING Date of Exam: 04/10/2021 Medical Rec #:  [id number]           Height:       65.0 in Accession #:    [id number]          Weight:       170.0 lb Date of Birth:  1935/03/12           BSA:          1.846 m Patient Age:    86 years            BP:           151/92 mmHg Patient Gender: F                   HR:           93 bpm. Exam Location:  [facility] Procedure: 2D Echo, Color Doppler and Cardiac Doppler Indications:     I50.31 congestive heart failure-Acute Diastolic  History:         Patient has no prior history of Echocardiogram examinations.                  CAD, COPD and PAD, Arrythmias:Atrial Fibrillation; Risk                  Factors:Hypertension, Dyslipidemia and Sleep Apnea.  Sonographer:     Charmayne Sheer Referring Phys:  [id number] Athena Masse Diagnosing Phys: Donnelly Angelica IMPRESSIONS  1. Left ventricular ejection fraction, by estimation, is 50 to 55%. The left ventricle has low normal function. The left ventricle has no regional wall motion abnormalities. There is mild left ventricular hypertrophy. Left ventricular diastolic parameters are indeterminate.  2. Right ventricular systolic function is mildly reduced. The right ventricular size is mildly enlarged.  3. Left atrial size was moderately dilated.  4. Right atrial size was moderately dilated.  5. The mitral valve is normal in structure. Mild to moderate mitral valve regurgitation. No evidence of mitral stenosis.  6. Tricuspid valve regurgitation is severe.  7. The aortic valve is normal in structure. Aortic valve regurgitation is not visualized. No aortic stenosis is present. FINDINGS  Left Ventricle: Left ventricular  ejection fraction, by estimation, is 50 to 55%. The left ventricle has low normal function. The left ventricle has no regional wall motion  abnormalities. The left ventricular internal cavity size was normal in size. There is mild left ventricular hypertrophy. Left ventricular diastolic parameters are indeterminate. Right Ventricle: The right ventricular size is mildly enlarged. No increase in right ventricular wall thickness. Right ventricular systolic function is mildly reduced. Left Atrium: Left atrial size was moderately dilated. Right Atrium: Right atrial size was moderately dilated. Pericardium: There is no evidence of pericardial effusion. Mitral Valve: The mitral valve is normal in structure. Mild to moderate mitral valve regurgitation. No evidence of mitral valve stenosis. MV peak gradient, 5.2 mmHg. The mean mitral valve gradient is 2.0 mmHg. Tricuspid Valve: The tricuspid valve is normal in structure. Tricuspid valve regurgitation is severe. Aortic Valve: The aortic valve is normal in structure. Aortic valve regurgitation is not visualized. No aortic stenosis is present. Aortic valve mean gradient measures 3.0 mmHg. Aortic valve peak gradient measures 4.8 mmHg. Aortic valve area, by VTI measures 1.53 cm. Pulmonic Valve: The pulmonic valve was not well visualized. Pulmonic valve regurgitation is not visualized. Aorta: The aortic root is normal in size and structure. Venous: Unclear if IVC visualized versus abdominal aorta. The inferior vena cava was not well visualized. IAS/Shunts: No atrial level shunt detected by color flow Doppler.  LEFT VENTRICLE PLAX 2D LVIDd:         4.01 cm   Diastology LVIDs:         3.35 cm   LV e' medial:    12.00 cm/s LV PW:         1.30 cm   LV E/e' medial:  7.6 LV IVS:        1.00 cm   LV e' lateral:   12.90 cm/s LVOT diam:     1.80 cm   LV E/e' lateral: 7.1 LV SV:         36 LV SV Index:   19 LVOT Area:     2.54 cm  RIGHT VENTRICLE RV Basal diam:  4.19 cm LEFT ATRIUM             Index        RIGHT ATRIUM           Index LA diam:        4.60 cm 2.49 cm/m   RA Area:     22.90 cm LA Vol (A2C):   50.5 ml 27.35  ml/m  RA Volume:   64.50 ml  34.94 ml/m LA Vol (A4C):   91.2 ml 49.40 ml/m LA Biplane Vol: 74.3 ml 40.25 ml/m  AORTIC VALVE                    PULMONIC VALVE AV Area (Vmax):    1.68 cm     PV Vmax:       0.89 m/s AV Area (Vmean):   1.52 cm     PV Vmean:      60.000 cm/s AV Area (VTI):     1.53 cm     PV VTI:        0.174 m AV Vmax:           110.00 cm/s  PV Peak grad:  3.2 mmHg AV Vmean:          82.300 cm/s  PV Mean grad:  2.0 mmHg AV VTI:  0.235 m AV Peak Grad:      4.8 mmHg AV Mean Grad:      3.0 mmHg LVOT Vmax:         72.50 cm/s LVOT Vmean:        49.300 cm/s LVOT VTI:          0.141 m LVOT/AV VTI ratio: 0.60  AORTA Ao Root diam: 3.10 cm MITRAL VALVE               TRICUSPID VALVE MV Area (PHT): 3.39 cm    TR Peak grad:   30.9 mmHg MV Area VTI:   2.30 cm    TR Vmax:        278.00 cm/s MV Peak grad:  5.2 mmHg MV Mean grad:  2.0 mmHg    SHUNTS MV Vmax:       1.14 m/s    Systemic VTI:  0.14 m MV Vmean:      67.6 cm/s   Systemic Diam: 1.80 cm MV Decel Time: 224 msec MV E velocity: 91.45 cm/s Donnelly Angelica Electronically signed by Donnelly Angelica Signature Date/Time: 04/10/2021/10:53:13 AM    Final     Microbiology: Results for orders placed or performed during the hospital encounter of 04/09/21  Resp Panel by RT-PCR (Flu A&B, Covid) Nasopharyngeal Swab     Status: None   Collection Time: 04/09/21  9:35 PM   Specimen: Nasopharyngeal Swab; Nasopharyngeal(NP) swabs in vial transport medium  Result Value Ref Range Status   SARS Coronavirus 2 by RT PCR NEGATIVE NEGATIVE Final    Comment: (NOTE) SARS-CoV-2 target nucleic acids are NOT DETECTED.  The SARS-CoV-2 RNA is generally detectable in upper respiratory specimens during the acute phase of infection. The lowest concentration of SARS-CoV-2 viral copies this assay can detect is 138 copies/mL. A negative result does not preclude SARS-Cov-2 infection and should not be used as the sole basis for treatment or other patient management decisions. A  negative result may occur with  improper specimen collection/handling, submission of specimen other than nasopharyngeal swab, presence of viral mutation(s) within the areas targeted by this assay, and inadequate number of viral copies(<138 copies/mL). A negative result must be combined with clinical observations, patient history, and epidemiological information. The expected result is Negative.  Fact Sheet for Patients:  EntrepreneurPulse.com.au  Fact Sheet for Healthcare Providers:  IncredibleEmployment.be  This test is no t yet approved or cleared by the [place] FDA and  has been authorized for detection and/or diagnosis of SARS-CoV-2 by FDA under an Emergency Use Authorization (EUA). This EUA will remain  in effect (meaning this test can be used) for the duration of the COVID-19 declaration under Section 564(b)(1) of the Act, 21 U.S.C.section 360bbb-3(b)(1), unless the authorization is terminated  or revoked sooner.       Influenza A by PCR NEGATIVE NEGATIVE Final   Influenza B by PCR NEGATIVE NEGATIVE Final    Comment: (NOTE) The Xpert Xpress SARS-CoV-2/FLU/RSV plus assay is intended as an aid in the diagnosis of influenza from Nasopharyngeal swab specimens and should not be used as a sole basis for treatment. Nasal washings and aspirates are unacceptable for Xpert Xpress SARS-CoV-2/FLU/RSV testing.  Fact Sheet for Patients: EntrepreneurPulse.com.au  Fact Sheet for Healthcare Providers: IncredibleEmployment.be  This test is not yet approved or cleared by the [place] FDA and has been authorized for detection and/or diagnosis of SARS-CoV-2 by FDA under an Emergency Use Authorization (EUA). This EUA will remain in effect (meaning this test can be  used) for the duration of the COVID-19 declaration under Section 564(b)(1) of the Act, 21 U.S.C. section 360bbb-3(b)(1), unless the authorization  is terminated or revoked.  Performed at [facility], [address] [place], [place] [address]     Labs: CBC: Recent Labs  Lab 04/09/21 2041 04/11/21 0602  WBC 7.1 7.6  HGB 12.3 11.7*  HCT 38.4 36.6  MCV 105.5* 103.1*  PLT 189 444   Basic Metabolic Panel: Recent Labs  Lab 04/09/21 2041 04/10/21 0454 04/11/21 0602  NA 137 139 143  K 3.9 3.9 4.5  CL 105 106 107  CO2 '24 24 25  '$ GLUCOSE 136* 167* 152*  BUN 31* 32* 29*  CREATININE 1.23* 1.06* 1.04*  CALCIUM 8.1* 8.3* 9.0     Discharge time spent: greater than 30 minutes.  Signed: Loletha Grayer, MD [facility] 04/11/2021

## 2021-04-12 ENCOUNTER — Telehealth: Payer: Self-pay

## 2021-04-12 NOTE — Telephone Encounter (Signed)
Transition Care Management Follow-up Telephone Call ?Date of discharge and from where: [facility] 04-11-21 Dx: Pneumonia ?How have you been since you were released from the hospital? Weak but ok ?Any questions or concerns? No ? ?Items Reviewed: ?Did the pt receive and understand the discharge instructions provided? Yes  ?Medications obtained and verified? Yes  ?Other? No  ?Any new allergies since your discharge? No  ?Dietary orders reviewed? Yes ?Do you have support at home? Yes  ? ?Home Care and Equipment/Supplies: ?Were home health services ordered? no ?If so, what is the name of the agency? na  ?Has the agency set up a time to come to the patient's home? not applicable ?Were any new equipment or medical supplies ordered?  No ?What is the name of the medical supply agency? na ?Were you able to get the supplies/equipment? not applicable ?Do you have any questions related to the use of the equipment or supplies? No ? ?Functional Questionnaire: (I = Independent and D = Dependent) ?ADLs: I ? ?Bathing/Dressing- I ? ?Meal Prep- I ? ?Eating- I ? ?Maintaining continence- I ? ?Transferring/Ambulation- I with walker ? ?Managing Meds- I ? ?Follow up appointments reviewed: ? ?PCP Hospital f/u appt confirmed? Yes  Scheduled to see Dr Cheryll Cockayne on 04-17-21 @ 140pm. ?[facility] f/u appt confirmed? No  . ?Are transportation arrangements needed? No  ?If their condition worsens, is the pt aware to call PCP or go to the Emergency Dept.? Yes ?Was the patient provided with contact information for the PCP's office or ED? Yes ?Was to pt encouraged to call back with questions or concerns? Yes  ?

## 2021-04-14 NOTE — Progress Notes (Deleted)
? ?Acute Office Visit ? ?Subjective:  ? ? Patient ID: Christina [name], female    DOB: Aug 05, 1935, 86 y.o.   MRN: [id number] ? ?No chief complaint on file. ? ? ?HPI ? ? ?Past Medical History:  ?Diagnosis Date  ? Arthritis   ? COPD (chronic obstructive pulmonary disease) ([place])   ? Coronary artery disease   ? GERD (gastroesophageal reflux disease)   ? Hyperlipidemia   ? Hypertension   ? Macular degeneration   ? PAD (peripheral artery disease) ([place])   ? Sleep apnea   ? ? ?Past Surgical History:  ?Procedure Laterality Date  ? ABDOMINAL HYSTERECTOMY  1980  ? due to dysfunctional uterine bleeding  ? APPENDECTOMY  1980  ? BREAST SURGERY Left 2000  ? biopsy  ? CARDIAC CATHETERIZATION    ? COLONOSCOPY    ? CORONARY ARTERY BYPASS GRAFT  2006  ? HEMORRHOID SURGERY    ? LOWER EXTREMITY ANGIOGRAPHY Right 02/25/2017  ? Procedure: LOWER EXTREMITY ANGIOGRAPHY;  Surgeon: Algernon Huxley, MD;  Location: [facility];  Service: Cardiovascular;  Laterality: Right;  ? LOWER EXTREMITY INTERVENTION  02/25/2017  ? Procedure: LOWER EXTREMITY INTERVENTION;  Surgeon: Algernon Huxley, MD;  Location: [facility];  Service: Cardiovascular;;  ? LUNG BIOPSY  1999  ? Negative  ? TONSILLECTOMY    ? ? ?Family History  ?Problem Relation Age of Onset  ? Hypertension Mother   ? Hyperlipidemia Mother   ? Alzheimer's disease Mother   ? CAD Mother   ? Heart attack Father   ? Lung disease Sister   ? Heart disease Sister   ? Diabetes Paternal Grandfather   ?     Type 2  ? Hearing loss Son   ? ? ?Social History  ? ?Socioeconomic History  ? Marital status: Married  ?  Spouse name: Cliffton Mathis Bud  ? Number of children: 2  ? Years of education: 55  ? Highest education level: 12th grade  ?Occupational History  ? Occupation: retired  ?Tobacco Use  ? Smoking status: Former  ?  Packs/day: 0.50  ?  Years: 5.00  ?  Pack years: 2.50  ?  Types: Cigarettes  ?  Quit date: 04/24/2004  ?  Years since quitting: 16.9  ? Smokeless tobacco: Never  ?Vaping Use  ?  Vaping Use: Never used  ?Substance and Sexual Activity  ? Alcohol use: No  ? Drug use: No  ? Sexual activity: Yes  ?Other Topics Concern  ? Not on file  ?Social History Narrative  ? Not on file  ? ?Social Determinants of Health  ? ?Financial Resource Strain: Low Risk   ? Difficulty of Paying Living Expenses: Not hard at all  ?Food Insecurity: Not on file  ?Transportation Needs: Not on file  ?Physical Activity: Not on file  ?Stress: Not on file  ?Social Connections: Not on file  ?Intimate Partner Violence: Not on file  ? ? ?Outpatient Medications Prior to Visit  ?Medication Sig Dispense Refill  ? acetaminophen (TYLENOL) 500 MG tablet Take 500 mg by mouth every 8 (eight) hours as needed for mild pain or moderate pain.    ? albuterol (VENTOLIN HFA) 108 (90 Base) MCG/ACT inhaler Inhale 2 puffs into the lungs every 6 (six) hours as needed for wheezing or shortness of breath. 18 g 0  ? Ascorbic Acid (VITAMIN C) 1000 MG tablet Take 1,000 mg by mouth daily.    ? aspirin 81 MG tablet Take 81 mg  by mouth daily.     ? azelastine (ASTELIN) 0.1 % nasal spray Place 1 spray into both nostrils daily as needed.    ? azithromycin (ZITHROMAX) 250 MG tablet One tab po daily for two days 2 each 0  ? budesonide-formoterol (SYMBICORT) 160-4.5 MCG/ACT inhaler TAKE 1 PUFF BY MOUTH TWICE A DAY 10.2 each 3  ? Calcium Carb-Cholecalciferol (CALCIUM 600+D3 PO) Take 1 tablet by mouth daily.    ? carboxymethylcellulose (REFRESH PLUS) 0.5 % SOLN 1 drop 3 (three) times daily as needed.    ? Coenzyme Q10 (COQ-10) 100 MG capsule Take 1 capsule (100 mg total) by mouth daily. 30 capsule 1  ? docusate sodium (COLACE) 100 MG capsule Take 200 mg by mouth at bedtime.    ? hydrochlorothiazide (HYDRODIURIL) 12.5 MG tablet Take 12.5 mg by mouth daily.    ? metoprolol tartrate 75 MG TABS Take 75 mg by mouth 2 (two) times daily. 60 tablet 0  ? Multiple Vitamin (MULTI-VITAMIN DAILY PO) Take 1 tablet by mouth daily.    ? pantoprazole (PROTONIX) 40 MG tablet TAKE 1  TABLET (40 MG TOTAL) BY MOUTH ONCE DAILY TAKE 30 MINUTES BEFORE BREAKFAST. 90 tablet 3  ? predniSONE (DELTASONE) 20 MG tablet Two tabs po daily for two days 2 tablet 0  ? rosuvastatin (CRESTOR) 20 MG tablet Take 1 tablet (20 mg total) by mouth daily. 90 tablet 3  ? tobramycin (TOBREX) 0.3 % ophthalmic solution   5  ? ?No facility-administered medications prior to visit.  ? ? ?Allergies  ?Allergen Reactions  ? Baclofen Other (See Comments)  ? Codeine Nausea Only  ? Plavix [Clopidogrel]   ?  brusing all over  ? Sulfa Antibiotics Nausea Only  ? Latex Rash  ? ? ?Review of Systems ? ?   ?Objective:  ?  ?Physical Exam ? ?There were no vitals taken for this visit. ?Wt Readings from Last 3 Encounters:  ?04/11/21 165 lb 8 oz (75.1 kg)  ?04/06/21 170 lb (77.1 kg)  ?03/23/21 165 lb (74.8 kg)  ? ? ?Health Maintenance Due  ?Topic Date Due  ? Zoster Vaccines- Shingrix (1 of 2) Never done  ? DEXA SCAN  01/12/2018  ? COVID-19 Vaccine (4 - Booster for Pfizer series) 01/04/2020  ? ? ?There are no preventive care reminders to display for this patient. ? ? ?Lab Results  ?Component Value Date  ? TSH 4.590 (H) 03/18/2020  ? ?Lab Results  ?Component Value Date  ? WBC 7.6 04/11/2021  ? HGB 11.7 (L) 04/11/2021  ? HCT 36.6 04/11/2021  ? MCV 103.1 (H) 04/11/2021  ? PLT 195 04/11/2021  ? ?Lab Results  ?Component Value Date  ? NA 143 04/11/2021  ? K 4.5 04/11/2021  ? CO2 25 04/11/2021  ? GLUCOSE 152 (H) 04/11/2021  ? BUN 29 (H) 04/11/2021  ? CREATININE 1.04 (H) 04/11/2021  ? BILITOT 0.5 03/18/2020  ? ALKPHOS 92 03/18/2020  ? AST 22 03/18/2020  ? ALT 13 03/18/2020  ? PROT 6.5 03/18/2020  ? ALBUMIN 4.5 03/18/2020  ? CALCIUM 9.0 04/11/2021  ? ANIONGAP 11 04/11/2021  ? ?Lab Results  ?Component Value Date  ? CHOL 159 03/18/2020  ? ?Lab Results  ?Component Value Date  ? HDL 59 03/18/2020  ? ?Lab Results  ?Component Value Date  ? Plain 75 03/18/2020  ? ?Lab Results  ?Component Value Date  ? TRIG 143 03/18/2020  ? ?Lab Results  ?Component Value Date   ? CHOLHDL 3.0 02/20/2019  ? ?Lab  Results  ?Component Value Date  ? HGBA1C 5.9 (H) 03/18/2020  ? ? ?   ?Assessment & Plan:  ? ?Problem List Items Addressed This Visit   ?None ? ? ? ?No orders of the defined types were placed in this encounter. ? ? ? ?Lenetta Quaker, CMA ? ?

## 2021-04-14 NOTE — Progress Notes (Incomplete)
Established patient visit   Patient: Christina [name]   DOB: 02-20-1935   86 y.o. Female  MRN: [id number] Visit Date: 04/17/2021  Today's healthcare provider: [name], PA-C   No chief complaint on file.  Subjective    HPI  Follow up Hospitalization  Patient was admitted to on 04-09-21 and discharged on 04-11-21. She was treated for pneumonia and COPD exacerbation and atrial fibrillation Treatment included: See note in chart.  Telephone follow up was done on 04-12-21 She reports fair compliance with treatment. She reports this condition is improved.  During the hospital stay, chest x-ray showed possible pneumonia.  Patient's blood work was without any concerning anemia or electrolyte abnormality.  Was given a course of IV antibiotics.  Additionally was treated for possible COPD exacerbation with steroids.    * Pneumonia Patient started on Rocephin and Zithromax at hospital and was discharged on Omnicef and Zithromax;  procalcitonina and WBC were negative  COPD with acute exacerbation [facility]) Patient was given Solu-Medrol at hospital and was discharged on prednisone    Atrial fibrillation (HCC) Taking  metoprolol 75 mg twice a day.  Cardiology recommended Eliquis for anticoagulation but the patient refused.  EF normal with mild to moderate MR and severe TR.   Hiatal hernia On previous imaging studies the patient have a hiatal hernia with part of the stomach in the right chest.  This may be contributing to the patient's atrial fibrillation. Dr. Dahlia Byes general surgery will follow-up as outpatient on 05/01/21   Acid reflux Taking PPI   Impaired fasting glucose Sugars a little bit elevated secondary to steroids.    PAD (peripheral artery disease) (HCC) Taking aspirin and rosuvastatin   Stage 3a chronic kidney disease (HCC) Creatinine 1.23 on coming in and 1.04 currently GFR still 53        ----------------------------------------------------------------------------------------- -   Medications: Outpatient Medications Prior to Visit  Medication Sig   acetaminophen (TYLENOL) 500 MG tablet Take 500 mg by mouth every 8 (eight) hours as needed for mild pain or moderate pain.   albuterol (VENTOLIN HFA) 108 (90 Base) MCG/ACT inhaler Inhale 2 puffs into the lungs every 6 (six) hours as needed for wheezing or shortness of breath.   Ascorbic Acid (VITAMIN C) 1000 MG tablet Take 1,000 mg by mouth daily.   aspirin 81 MG tablet Take 81 mg by mouth daily.    azelastine (ASTELIN) 0.1 % nasal spray Place 1 spray into both nostrils daily as needed.   azithromycin (ZITHROMAX) 250 MG tablet One tab po daily for two days   budesonide-formoterol (SYMBICORT) 160-4.5 MCG/ACT inhaler TAKE 1 PUFF BY MOUTH TWICE A DAY   Calcium Carb-Cholecalciferol (CALCIUM 600+D3 PO) Take 1 tablet by mouth daily.   carboxymethylcellulose (REFRESH PLUS) 0.5 % SOLN 1 drop 3 (three) times daily as needed.   Coenzyme Q10 (COQ-10) 100 MG capsule Take 1 capsule (100 mg total) by mouth daily.   docusate sodium (COLACE) 100 MG capsule Take 200 mg by mouth at bedtime.   hydrochlorothiazide (HYDRODIURIL) 12.5 MG tablet Take 12.5 mg by mouth daily.   metoprolol tartrate 75 MG TABS Take 75 mg by mouth 2 (two) times daily.   Multiple Vitamin (MULTI-VITAMIN DAILY PO) Take 1 tablet by mouth daily.   pantoprazole (PROTONIX) 40 MG tablet TAKE 1 TABLET (40 MG TOTAL) BY MOUTH ONCE DAILY TAKE 30 MINUTES BEFORE BREAKFAST.   predniSONE (DELTASONE) 20 MG tablet Two tabs po daily for two days   rosuvastatin (CRESTOR)  20 MG tablet Take 1 tablet (20 mg total) by mouth daily.   tobramycin (TOBREX) 0.3 % ophthalmic solution    No facility-administered medications prior to visit.    Review of Systems  Constitutional:  Positive for fatigue.  HENT:  Positive for dental problem (problems with dentures) and voice change  (mild).   Cardiovascular:  Positive for leg swelling (mild). Negative for chest pain.  Gastrointestinal: Negative.   Genitourinary: Negative.   Skin: Negative.   All other systems reviewed and are negative.    Objective    BP (!) 154/76 (BP Location: Right Arm, Patient Position: Sitting, Cuff Size: Normal)    Pulse 93    Temp 98.2 F (36.8 C) (Oral)    Resp 12    Wt 162 lb 6.4 oz (73.7 kg)    SpO2 97%    BMI 27.02 kg/m    Physical Exam Vitals and nursing note reviewed.  HENT:     Head: Normocephalic and atraumatic.     Mouth/Throat:     Pharynx: Posterior oropharyngeal erythema present.    ***  No results found for any visits on 04/17/21.  Assessment & Plan        1. Hospital discharge follow-up  2. COPD with acute exacerbation [facility]) Patient was given Solu-Medrol during the hospital course and switch over to prednisone. FU with pulmonology?  3. Atrial fibrillation, unspecified type (HCC) Increase metoprolol 75 mg twice a day.  Cardiology recommended Eliquis for anticoagulation but the patient refused.  I explained to her that with atrial fibrillation without a blood thinner we have a higher risk of stroke.  She understands the risk.  EF normal with mild to moderate MR and severe TR. FU with Cardiology on 04/28/21  4. Pneumonia due to infectious organism, unspecified laterality, unspecified part of lung . Took omnicef and zithromax FU with pulmonology?  5. Hiatal hernia On previous imaging studies the patient does have a hiatal hernia with part of the stomach in the right chest.  This may be contributing to the patient's atrial fibrillation.  Case discussed with Dr. Dahlia Byes general surgery yesterday and he will follow-up as outpatient. On 05/01/21   6. Acid reflux Continue PPI   7. Impaired fasting glucose Sugars a little bit elevated secondary to steroids.  Continue to monitor as outpatient. Might consider POCT glucose or POCT A1C   8. PAD (peripheral artery disease)  (HCC) Continue aspirin and rosuvastatin   9. Stage 3a chronic kidney disease (HCC) Creatinine 1.23 on coming in and 1.04 currently GFR still 53 Might consider to order [facility]   Written instructions were provided. FU with [name], her PCP   The patient was advised to call back or seek an in-person evaluation if the symptoms worsen or if the condition fails to improve as anticipated.  I discussed the assessment and treatment plan with the patient. The patient was provided an opportunity to ask questions and all were answered. The patient agreed with the plan and demonstrated an understanding of the instructions.  The entirety of the information documented in the History of Present Illness, Review of Systems and Physical Exam were personally obtained by me. Portions of this information were initially documented by the CMA and reviewed by me for thoroughness and accuracy.     [name], PA-C  [facility] [phone] (phone) [phone] (fax)  [place]

## 2021-04-16 NOTE — Assessment & Plan Note (Signed)
Creatinine 1.23 on coming in and 1.04 currently GFR still 53. 04/11/21 ?

## 2021-04-16 NOTE — Assessment & Plan Note (Signed)
Dr. Dahlia Byes general surgery  will follow-up as outpatient. Scheduled for 05/01/21 ?  ?

## 2021-04-17 ENCOUNTER — Ambulatory Visit (INDEPENDENT_AMBULATORY_CARE_PROVIDER_SITE_OTHER): Payer: Medicare Other | Admitting: Physician Assistant

## 2021-04-17 ENCOUNTER — Other Ambulatory Visit: Payer: Self-pay

## 2021-04-17 ENCOUNTER — Encounter: Payer: Self-pay | Admitting: Physician Assistant

## 2021-04-17 VITALS — BP 154/76 | HR 93 | Temp 98.2°F | Resp 12 | Wt 162.4 lb

## 2021-04-17 DIAGNOSIS — Z09 Encounter for follow-up examination after completed treatment for conditions other than malignant neoplasm: Secondary | ICD-10-CM

## 2021-04-17 DIAGNOSIS — J449 Chronic obstructive pulmonary disease, unspecified: Secondary | ICD-10-CM | POA: Diagnosis not present

## 2021-04-17 DIAGNOSIS — J189 Pneumonia, unspecified organism: Secondary | ICD-10-CM | POA: Diagnosis not present

## 2021-04-17 DIAGNOSIS — N1831 Chronic kidney disease, stage 3a: Secondary | ICD-10-CM | POA: Diagnosis not present

## 2021-04-17 DIAGNOSIS — K449 Diaphragmatic hernia without obstruction or gangrene: Secondary | ICD-10-CM | POA: Diagnosis not present

## 2021-04-17 DIAGNOSIS — K219 Gastro-esophageal reflux disease without esophagitis: Secondary | ICD-10-CM

## 2021-04-17 DIAGNOSIS — R7301 Impaired fasting glucose: Secondary | ICD-10-CM | POA: Diagnosis not present

## 2021-04-17 DIAGNOSIS — I4891 Unspecified atrial fibrillation: Secondary | ICD-10-CM

## 2021-04-17 DIAGNOSIS — I739 Peripheral vascular disease, unspecified: Secondary | ICD-10-CM | POA: Diagnosis not present

## 2021-04-24 DIAGNOSIS — G4733 Obstructive sleep apnea (adult) (pediatric): Secondary | ICD-10-CM | POA: Diagnosis not present

## 2021-04-28 DIAGNOSIS — I10 Essential (primary) hypertension: Secondary | ICD-10-CM | POA: Diagnosis not present

## 2021-04-28 DIAGNOSIS — I071 Rheumatic tricuspid insufficiency: Secondary | ICD-10-CM | POA: Diagnosis not present

## 2021-04-28 DIAGNOSIS — E782 Mixed hyperlipidemia: Secondary | ICD-10-CM | POA: Diagnosis not present

## 2021-04-28 DIAGNOSIS — Z951 Presence of aortocoronary bypass graft: Secondary | ICD-10-CM | POA: Diagnosis not present

## 2021-04-28 DIAGNOSIS — I739 Peripheral vascular disease, unspecified: Secondary | ICD-10-CM | POA: Diagnosis not present

## 2021-04-28 DIAGNOSIS — R0609 Other forms of dyspnea: Secondary | ICD-10-CM | POA: Diagnosis not present

## 2021-04-28 DIAGNOSIS — I25118 Atherosclerotic heart disease of native coronary artery with other forms of angina pectoris: Secondary | ICD-10-CM | POA: Diagnosis not present

## 2021-04-28 DIAGNOSIS — G4733 Obstructive sleep apnea (adult) (pediatric): Secondary | ICD-10-CM | POA: Diagnosis not present

## 2021-04-28 DIAGNOSIS — I6523 Occlusion and stenosis of bilateral carotid arteries: Secondary | ICD-10-CM | POA: Diagnosis not present

## 2021-04-28 DIAGNOSIS — R609 Edema, unspecified: Secondary | ICD-10-CM | POA: Diagnosis not present

## 2021-04-28 DIAGNOSIS — R42 Dizziness and giddiness: Secondary | ICD-10-CM | POA: Diagnosis not present

## 2021-05-01 ENCOUNTER — Telehealth: Payer: Self-pay

## 2021-05-01 ENCOUNTER — Encounter: Payer: Self-pay | Admitting: Surgery

## 2021-05-01 ENCOUNTER — Ambulatory Visit: Payer: Medicare Other | Admitting: Surgery

## 2021-05-01 ENCOUNTER — Other Ambulatory Visit: Payer: Self-pay

## 2021-05-01 VITALS — BP 170/85 | HR 95 | Temp 97.9°F | Ht 66.0 in | Wt 164.4 lb

## 2021-05-01 DIAGNOSIS — K449 Diaphragmatic hernia without obstruction or gangrene: Secondary | ICD-10-CM

## 2021-05-01 NOTE — Telephone Encounter (Signed)
Faxed cardiac clearance to Dr. Dwayne Callwood at [phone]. 

## 2021-05-01 NOTE — Patient Instructions (Addendum)
Your CT is scheduled for 05/09/2021 @ 2 pm (arrive by 1:45 pm) at [facility]. Nothing to eat or drink 4 hours prior. Please pick up contrast between now and the day before.  ? ? ?Your Barium Swallow is scheduled for 05/16/2021 @ 11 am (check in by 10:45 am) at [facility]. Nothing to eat or drink 3 hours prior.  ? ?A referral has been placed to Gastroenterologist for an EGD. They will call you with an appointment.  ? ?If you have any concerns or questions, please feel free to call our office. See follow up appointment below.  ? ? ?Hiatal Hernia ? ?A hiatal hernia occurs when part of the stomach slides above the muscle that separates the abdomen from the chest (diaphragm). A person can be born with a hiatal hernia (congenital), or it may develop over time. In almost all cases of hiatal hernia, only the top part of the stomach pushes through the diaphragm. ?Many people have a hiatal hernia with no symptoms. The larger the hernia, the more likely it is that you will have symptoms. In some cases, a hiatal hernia allows stomach acid to flow back into the tube that carries food from your mouth to your stomach (esophagus). This may cause heartburn symptoms. Severe heartburn symptoms may mean that you have developed a condition called gastroesophageal reflux disease (GERD). ?What are the causes? ?This condition is caused by a weakness in the opening (hiatus) where the esophagus passes through the diaphragm to attach to the upper part of the stomach. A person may be born with a weakness in the hiatus, or a weakness can develop over time. ?What increases the risk? ?This condition is more likely to develop in: ?Older people. Age is a major risk factor for a hiatal hernia, especially if you are over the age of 12. ?Pregnant women. ?People who are overweight. ?People who have frequent constipation. ?What are the signs or symptoms? ?Symptoms of this condition usually develop in the form of GERD symptoms. Symptoms  include: ?Heartburn. ?Belching. ?Indigestion. ?Trouble swallowing. ?Coughing or wheezing. ?Sore throat. ?Hoarseness. ?Chest pain. ?Nausea and vomiting. ?How is this diagnosed? ?This condition may be diagnosed during testing for GERD. Tests that may be done include: ?X-rays of your stomach or chest. ?An upper gastrointestinal (GI) series. This is an X-ray exam of your GI tract that is taken after you swallow a chalky liquid that shows up clearly on the X-ray. ?Endoscopy. This is a procedure to look into your stomach using a thin, flexible tube that has a tiny camera and light on the end of it. ?How is this treated? ?This condition may be treated by: ?Dietary and lifestyle changes to help reduce GERD symptoms. ?Medicines. These may include: ?Over-the-counter antacids. ?Medicines that make your stomach empty more quickly. ?Medicines that block the production of stomach acid (H2 blockers). ?Stronger medicines to reduce stomach acid (proton pump inhibitors). ?Surgery to repair the hernia, if other treatments are not helping. ?If you have no symptoms, you may not need treatment. ?Follow these instructions at home: ?Lifestyle and activity ?Do not use any products that contain nicotine or tobacco, such as cigarettes and e-cigarettes. If you need help quitting, ask your health care provider. ?Try to achieve and maintain a healthy body weight. ?Avoid putting pressure on your abdomen. Anything that puts pressure on your abdomen increases the amount of acid that may be pushed up into your esophagus. ?Avoid bending over, especially after eating. ?Raise the head of your bed  by putting blocks under the legs. This keeps your head and esophagus higher than your stomach. ?Do not wear tight clothing around your chest or stomach. ?Try not to strain when having a bowel movement, when urinating, or when lifting heavy objects. ?Eating and drinking ?Avoid foods that can worsen GERD symptoms. These may include: ?Fatty foods, like fried  foods. ?Citrus fruits, like oranges or lemon. ?Other foods and drinks that contain acid, like orange juice or tomatoes. ?Spicy food. ?Chocolate. ?Eat frequent small meals instead of three large meals a day. This helps prevent your stomach from getting too full. ?Eat slowly. ?Do not lie down right after eating. ?Do not eat 1-2 hours before bed. ?Do not drink beverages with caffeine. These include cola, coffee, cocoa, and tea. ?Do not drink alcohol. ?General instructions ?Take over-the-counter and prescription medicines only as told by your health care provider. ?Keep all follow-up visits as told by your health care provider. This is important. ?Contact a health care provider if: ?Your symptoms are not controlled with medicines or lifestyle changes. ?You are having trouble swallowing. ?You have coughing or wheezing that will not go away. ?Get help right away if: ?Your pain is getting worse. ?Your pain spreads to your arms, neck, jaw, teeth, or back. ?You have shortness of breath. ?You sweat for no reason. ?You feel sick to your stomach (nauseous) or you vomit. ?You vomit blood. ?You have bright red blood in your stools. ?You have black, tarry stools. ?Summary ?A hiatal hernia occurs when part of the stomach slides above the muscle that separates the abdomen from the chest (diaphragm). ?A person may be born with a weakness in the hiatus, or a weakness can develop over time. ?Symptoms of hiatal hernia may include heartburn, trouble swallowing, or sore throat. ?Management of hiatal hernia includes eating frequent small meals instead of three large meals a day. ?Get help right away if you vomit blood, have bright red blood in your stools, or have black, tarry stools. ?This information is not intended to replace advice given to you by your health care provider. Make sure you discuss any questions you have with your health care provider. ?Document Revised: 12/24/2019 Document Reviewed: 12/24/2019 ?Elsevier Patient Education  ? [place]. ? ?

## 2021-05-05 NOTE — Progress Notes (Signed)
Patient ID: Christina [name], female   DOB: 02-17-1935, 86 y.o.   MRN: [id number] ? ?HPI ?Christina [name] is a 86 y.o. female medical history significant for coronary artery disease, COPD, pretension. ?She Is being referred for evaluation of paraesophageal hernia. ?Does have significant history of reflux disease and had prior esophagitis with Schatzki ring that was dilated at some point in time.  Feels acid taste in her mouth chest pressure and some hoarseness.  She feels heartburn as well.  Currently is taking pantoprazole ? ?Chest x-ray personally reviewed showing a large hiatal hernia and a pleural effusion the left side.  She also had a CT scan 2 years ago noting a large hiatal hernia with about half of the stomach within the mediastinum, pers. Reviewed. ?Does have a significant history of coronary artery disease status post CABG in 2006 ?Surgical history include hysterectomy and appendectomy. ?CBC and BMP is normal other than mild ovation of BUN of 29 and creatinine of 1.04.  Glycemia is 152. ?She is functional and she is able to walk and do all her ADLs.  She is competent.  Does have some dyspnea on exertion ?Her heart doctor is Dr. [name] is currently anticoagulated ? ?HPI ? ?Past Medical History:  ?Diagnosis Date  ? Arthritis   ? COPD (chronic obstructive pulmonary disease) ([place])   ? Coronary artery disease   ? GERD (gastroesophageal reflux disease)   ? Hyperlipidemia   ? Hypertension   ? Macular degeneration   ? PAD (peripheral artery disease) ([place])   ? Sleep apnea   ? ? ?Past Surgical History:  ?Procedure Laterality Date  ? ABDOMINAL HYSTERECTOMY  1980  ? due to dysfunctional uterine bleeding  ? APPENDECTOMY  1980  ? BREAST SURGERY Left 2000  ? biopsy  ? CARDIAC CATHETERIZATION    ? COLONOSCOPY    ? CORONARY ARTERY BYPASS GRAFT  2006  ? HEMORRHOID SURGERY    ? LOWER EXTREMITY ANGIOGRAPHY Right 02/25/2017  ? Procedure: LOWER EXTREMITY ANGIOGRAPHY;  Surgeon: Algernon Huxley, MD;  Location: [place]  CV LAB;  Service: Cardiovascular;  Laterality: Right;  ? LOWER EXTREMITY INTERVENTION  02/25/2017  ? Procedure: LOWER EXTREMITY INTERVENTION;  Surgeon: Algernon Huxley, MD;  Location: [place] CV [facility];  Service: Cardiovascular;;  ? LUNG BIOPSY  1999  ? Negative  ? TONSILLECTOMY    ? ? ?Family History  ?Problem Relation Age of Onset  ? Hypertension Mother   ? Hyperlipidemia Mother   ? Alzheimer's disease Mother   ? CAD Mother   ? Heart attack Father   ? Lung disease Sister   ? Heart disease Sister   ? Diabetes Paternal Grandfather   ?     Type 2  ? Hearing loss Son   ? ? ?Social History ?Social History  ? ?Tobacco Use  ? Smoking status: Former  ?  Packs/day: 0.50  ?  Years: 5.00  ?  Pack years: 2.50  ?  Types: Cigarettes  ?  Quit date: 04/24/2004  ?  Years since quitting: 17.0  ? Smokeless tobacco: Never  ?Vaping Use  ? Vaping Use: Never used  ?Substance Use Topics  ? Alcohol use: No  ? Drug use: No  ? ? ?Allergies  ?Allergen Reactions  ? Baclofen Other (See Comments)  ? Codeine Nausea Only  ? Plavix [Clopidogrel]   ?  brusing all over  ? Sulfa Antibiotics Nausea Only  ? Latex Rash  ? ? ?Current Outpatient Medications  ?Medication Sig  Dispense Refill  ? acetaminophen (TYLENOL) 500 MG tablet Take 500 mg by mouth every 8 (eight) hours as needed for mild pain or moderate pain.    ? albuterol (VENTOLIN HFA) 108 (90 Base) MCG/ACT inhaler Inhale 2 puffs into the lungs every 6 (six) hours as needed for wheezing or shortness of breath. 18 g 0  ? apixaban (ELIQUIS) 2.5 MG TABS tablet Take by mouth 2 (two) times daily.    ? Ascorbic Acid (VITAMIN C) 1000 MG tablet Take 1,000 mg by mouth daily.    ? aspirin 81 MG tablet Take 81 mg by mouth daily.     ? azelastine (ASTELIN) 0.1 % nasal spray Place 1 spray into both nostrils daily as needed.    ? azithromycin (ZITHROMAX) 250 MG tablet One tab po daily for two days 2 each 0  ? budesonide-formoterol (SYMBICORT) 160-4.5 MCG/ACT inhaler TAKE 1 PUFF BY MOUTH TWICE A DAY 10.2 each 3  ?  Calcium Carb-Cholecalciferol (CALCIUM 600+D3 PO) Take 1 tablet by mouth daily.    ? carboxymethylcellulose (REFRESH PLUS) 0.5 % SOLN 1 drop 3 (three) times daily as needed.    ? Coenzyme Q10 (COQ-10) 100 MG capsule Take 1 capsule (100 mg total) by mouth daily. 30 capsule 1  ? docusate sodium (COLACE) 100 MG capsule Take 200 mg by mouth at bedtime.    ? hydrochlorothiazide (HYDRODIURIL) 12.5 MG tablet Take 12.5 mg by mouth daily.    ? metoprolol tartrate 75 MG TABS Take 75 mg by mouth 2 (two) times daily. 60 tablet 0  ? Multiple Vitamin (MULTI-VITAMIN DAILY PO) Take 1 tablet by mouth daily.    ? pantoprazole (PROTONIX) 40 MG tablet TAKE 1 TABLET (40 MG TOTAL) BY MOUTH ONCE DAILY TAKE 30 MINUTES BEFORE BREAKFAST. 90 tablet 3  ? rosuvastatin (CRESTOR) 20 MG tablet Take 1 tablet (20 mg total) by mouth daily. 90 tablet 3  ? tobramycin (TOBREX) 0.3 % ophthalmic solution   5  ? ?No current facility-administered medications for this visit.  ? ? ? ?Review of Systems ?Full ROS  was asked and was negative except for the information on the HPI ? ?Physical Exam ?Blood pressure (!) 170/85, pulse 95, temperature 97.9 ?F (36.6 ?C), temperature source Oral, height '5\' 6"'$  (1.676 m), weight 164 lb 6.4 oz (74.6 kg), SpO2 91 %. ?CONSTITUTIONAL: NAd. ?EYES: Pupils are equal, round, , Sclera are non-icteric. ?EARS, NOSE, MOUTH AND THROAT: The oral mucosa is pink and moist. Hearing is intact to voice. ?LYMPH NODES:  Lymph nodes in the neck are normal. ?RESPIRATORY:  Lungs are clear. There is normal respiratory effort, with equal breath sounds bilaterally, and without pathologic use of accessory muscles. ?CARDIOVASCULAR: Heart is regular without murmurs, gallops, or rubs. ?GI: The abdomen is  soft, nontender, and nondistended. There are no palpable masses. There is no hepatosplenomegaly. There are normal bowel sounds in all quadrants. ?GU: Rectal deferred.   ?MUSCULOSKELETAL: Normal muscle strength and tone. No cyanosis or edema.   ?SKIN:  Turgor is good and there are no pathologic skin lesions or ulcers. ?NEUROLOGIC: Motor and sensation is grossly normal. Cranial nerves are grossly intact. ?PSYCH:  Oriented to person, place and time. Affect is normal. ? ?Data Reviewed ? ?I have personally reviewed the patient's imaging, laboratory findings and medical records.   ? ?Assessment/Plan ?86 year old fairly functional female with large paraesophageal hernia that is symptomatic.  Discussed with her in detail.  Given her symptoms I do think that this will need to be repaired  at some point in time the question will be regarding other medical issues we will make sure that we touch base with cardiology given her prior coronary artery disease.  Also we will further work her up with referral for endoscopy and a recent CT scan of the abdomen and pelvis as well as a barium swallow. ?Had an extensive discussion with the patient regarding her disease process and also the role for repair of paraesophageal hernia with an antireflux procedure.  Depending on work-up we may decide with a partial fundoplication's versus full fundoplication. ?Also discussed with her the procedure the risk benefits and possible implications as well as postoperative course.  She seems to be interested in further pursuing diagnostic work-up and potentially repair of the hiatal hernia.  Please note that I spent greater than 60 minutes in this encounter including coordination of her care, placing orders, personally reviewing imaging studies, counseling the patient and performing appropriate documentation ? ? ?Caroleen Hamman, MD FACS ?General Surgeon ?05/05/2021, 4:54 PM ? ?  ?

## 2021-05-09 ENCOUNTER — Other Ambulatory Visit: Payer: Medicare Other

## 2021-05-09 NOTE — Progress Notes (Unsigned)
Cardiac Clearance has been received from Dr [facility] office. The patient is cleared at Medium risk.  ?

## 2021-05-10 DIAGNOSIS — H43813 Vitreous degeneration, bilateral: Secondary | ICD-10-CM | POA: Diagnosis not present

## 2021-05-10 DIAGNOSIS — H353213 Exudative age-related macular degeneration, right eye, with inactive scar: Secondary | ICD-10-CM | POA: Diagnosis not present

## 2021-05-10 DIAGNOSIS — H353221 Exudative age-related macular degeneration, left eye, with active choroidal neovascularization: Secondary | ICD-10-CM | POA: Diagnosis not present

## 2021-05-10 DIAGNOSIS — H26493 Other secondary cataract, bilateral: Secondary | ICD-10-CM | POA: Diagnosis not present

## 2021-05-16 ENCOUNTER — Ambulatory Visit
Admission: RE | Admit: 2021-05-16 | Discharge: 2021-05-16 | Disposition: A | Discharge status: Home / Self Care | Payer: Medicare Other | Source: Ambulatory Visit | Attending: Surgery | Admitting: Surgery

## 2021-05-16 DIAGNOSIS — K222 Esophageal obstruction: Secondary | ICD-10-CM | POA: Diagnosis not present

## 2021-05-16 DIAGNOSIS — K219 Gastro-esophageal reflux disease without esophagitis: Secondary | ICD-10-CM | POA: Diagnosis not present

## 2021-05-16 DIAGNOSIS — K449 Diaphragmatic hernia without obstruction or gangrene: Secondary | ICD-10-CM

## 2021-05-19 ENCOUNTER — Ambulatory Visit
Admission: RE | Admit: 2021-05-19 | Discharge: 2021-05-19 | Disposition: A | Discharge status: Home / Self Care | Payer: Medicare Other | Source: Ambulatory Visit | Attending: Surgery | Admitting: Surgery

## 2021-05-19 DIAGNOSIS — I700 Atherosclerosis of aorta: Secondary | ICD-10-CM | POA: Diagnosis not present

## 2021-05-19 DIAGNOSIS — K5730 Diverticulosis of large intestine without perforation or abscess without bleeding: Secondary | ICD-10-CM | POA: Diagnosis not present

## 2021-05-19 DIAGNOSIS — K449 Diaphragmatic hernia without obstruction or gangrene: Secondary | ICD-10-CM | POA: Insufficient documentation

## 2021-05-19 MED ORDER — IOHEXOL 300 MG/ML  SOLN
100.0000 mL | Freq: Once | INTRAMUSCULAR | Status: AC | PRN
  Administered 2021-05-19: 100 mL via INTRAVENOUS

## 2021-05-25 DIAGNOSIS — G4733 Obstructive sleep apnea (adult) (pediatric): Secondary | ICD-10-CM | POA: Diagnosis not present

## 2021-05-28 ENCOUNTER — Other Ambulatory Visit: Payer: Self-pay | Admitting: Internal Medicine

## 2021-05-29 ENCOUNTER — Other Ambulatory Visit: Payer: Self-pay

## 2021-05-29 ENCOUNTER — Ambulatory Visit: Payer: Medicare Other | Admitting: Surgery

## 2021-05-29 ENCOUNTER — Telehealth: Payer: Self-pay

## 2021-05-29 ENCOUNTER — Encounter: Payer: Self-pay | Admitting: Surgery

## 2021-05-29 VITALS — BP 154/73 | HR 85 | Temp 97.7°F | Ht 66.0 in | Wt 166.4 lb

## 2021-05-29 DIAGNOSIS — I071 Rheumatic tricuspid insufficiency: Secondary | ICD-10-CM | POA: Diagnosis not present

## 2021-05-29 DIAGNOSIS — J449 Chronic obstructive pulmonary disease, unspecified: Secondary | ICD-10-CM | POA: Diagnosis not present

## 2021-05-29 DIAGNOSIS — I6523 Occlusion and stenosis of bilateral carotid arteries: Secondary | ICD-10-CM | POA: Diagnosis not present

## 2021-05-29 DIAGNOSIS — R0609 Other forms of dyspnea: Secondary | ICD-10-CM | POA: Diagnosis not present

## 2021-05-29 DIAGNOSIS — G4733 Obstructive sleep apnea (adult) (pediatric): Secondary | ICD-10-CM | POA: Diagnosis not present

## 2021-05-29 DIAGNOSIS — K449 Diaphragmatic hernia without obstruction or gangrene: Secondary | ICD-10-CM

## 2021-05-29 DIAGNOSIS — E782 Mixed hyperlipidemia: Secondary | ICD-10-CM | POA: Diagnosis not present

## 2021-05-29 DIAGNOSIS — I10 Essential (primary) hypertension: Secondary | ICD-10-CM | POA: Diagnosis not present

## 2021-05-29 DIAGNOSIS — R609 Edema, unspecified: Secondary | ICD-10-CM | POA: Diagnosis not present

## 2021-05-29 DIAGNOSIS — I25118 Atherosclerotic heart disease of native coronary artery with other forms of angina pectoris: Secondary | ICD-10-CM | POA: Diagnosis not present

## 2021-05-29 DIAGNOSIS — I739 Peripheral vascular disease, unspecified: Secondary | ICD-10-CM | POA: Diagnosis not present

## 2021-05-29 DIAGNOSIS — Z951 Presence of aortocoronary bypass graft: Secondary | ICD-10-CM | POA: Diagnosis not present

## 2021-05-29 NOTE — Progress Notes (Signed)
Pt not seen, she needs to complete EGD. ?

## 2021-05-29 NOTE — Telephone Encounter (Signed)
Cardiac Clearance faxed to Dr.Dwayne Callwood. ?  Medical Clearance faxed to [name].  ?

## 2021-05-30 ENCOUNTER — Other Ambulatory Visit: Payer: Self-pay

## 2021-05-30 DIAGNOSIS — K449 Diaphragmatic hernia without obstruction or gangrene: Secondary | ICD-10-CM

## 2021-05-31 ENCOUNTER — Other Ambulatory Visit: Payer: Self-pay | Admitting: Family Medicine

## 2021-05-31 ENCOUNTER — Other Ambulatory Visit: Payer: Self-pay

## 2021-05-31 DIAGNOSIS — I6523 Occlusion and stenosis of bilateral carotid arteries: Secondary | ICD-10-CM

## 2021-06-02 ENCOUNTER — Telehealth: Payer: Self-pay

## 2021-06-02 NOTE — Progress Notes (Unsigned)
Cardiac Clearance has been received from Dr [facility] office. The patient is cleared at Medium risk. Okay to stop Eliquis prior to surgery.  ?

## 2021-06-02 NOTE — Telephone Encounter (Signed)
Called patient but she had to leave her a detailed message letting her know that she is to hold her Eliquis 3 days prior to procedure and restart 3 days after. ?

## 2021-06-07 ENCOUNTER — Encounter: Payer: Self-pay | Admitting: Gastroenterology

## 2021-06-08 ENCOUNTER — Ambulatory Visit: Payer: Medicare Other | Admitting: Anesthesiology

## 2021-06-08 ENCOUNTER — Ambulatory Visit
Admission: RE | Admit: 2021-06-08 | Discharge: 2021-06-08 | Disposition: A | Discharge status: Home / Self Care | Payer: Medicare Other | Attending: Gastroenterology | Admitting: Gastroenterology

## 2021-06-08 ENCOUNTER — Encounter
Admission: RE | Disposition: A | Discharge status: Home / Self Care | Payer: Self-pay | Source: Home / Self Care | Attending: Gastroenterology

## 2021-06-08 ENCOUNTER — Encounter: Payer: Self-pay | Admitting: Gastroenterology

## 2021-06-08 DIAGNOSIS — I480 Paroxysmal atrial fibrillation: Secondary | ICD-10-CM | POA: Diagnosis not present

## 2021-06-08 DIAGNOSIS — Z79899 Other long term (current) drug therapy: Secondary | ICD-10-CM | POA: Diagnosis not present

## 2021-06-08 DIAGNOSIS — Z951 Presence of aortocoronary bypass graft: Secondary | ICD-10-CM | POA: Diagnosis not present

## 2021-06-08 DIAGNOSIS — Z87891 Personal history of nicotine dependence: Secondary | ICD-10-CM | POA: Insufficient documentation

## 2021-06-08 DIAGNOSIS — I2510 Atherosclerotic heart disease of native coronary artery without angina pectoris: Secondary | ICD-10-CM | POA: Diagnosis not present

## 2021-06-08 DIAGNOSIS — J449 Chronic obstructive pulmonary disease, unspecified: Secondary | ICD-10-CM | POA: Insufficient documentation

## 2021-06-08 DIAGNOSIS — Z7951 Long term (current) use of inhaled steroids: Secondary | ICD-10-CM | POA: Insufficient documentation

## 2021-06-08 DIAGNOSIS — K229 Disease of esophagus, unspecified: Secondary | ICD-10-CM | POA: Insufficient documentation

## 2021-06-08 DIAGNOSIS — I10 Essential (primary) hypertension: Secondary | ICD-10-CM | POA: Insufficient documentation

## 2021-06-08 DIAGNOSIS — Z01818 Encounter for other preprocedural examination: Secondary | ICD-10-CM | POA: Insufficient documentation

## 2021-06-08 DIAGNOSIS — M1990 Unspecified osteoarthritis, unspecified site: Secondary | ICD-10-CM | POA: Diagnosis not present

## 2021-06-08 DIAGNOSIS — K449 Diaphragmatic hernia without obstruction or gangrene: Secondary | ICD-10-CM | POA: Diagnosis not present

## 2021-06-08 DIAGNOSIS — G4730 Sleep apnea, unspecified: Secondary | ICD-10-CM | POA: Diagnosis not present

## 2021-06-08 DIAGNOSIS — K219 Gastro-esophageal reflux disease without esophagitis: Secondary | ICD-10-CM | POA: Insufficient documentation

## 2021-06-08 DIAGNOSIS — B379 Candidiasis, unspecified: Secondary | ICD-10-CM

## 2021-06-08 HISTORY — PX: ESOPHAGOGASTRODUODENOSCOPY (EGD) WITH PROPOFOL: SHX5813

## 2021-06-08 LAB — KOH PREP

## 2021-06-08 SURGERY — ESOPHAGOGASTRODUODENOSCOPY (EGD) WITH PROPOFOL
Anesthesia: General

## 2021-06-08 MED ORDER — SODIUM CHLORIDE 0.9 % IV SOLN
INTRAVENOUS | Status: DC
  Administered 2021-06-08: 1000 mL via INTRAVENOUS

## 2021-06-08 MED ORDER — PROPOFOL 10 MG/ML IV BOLUS
INTRAVENOUS | Status: DC | PRN
Start: 2021-06-08 — End: 2021-06-08
  Administered 2021-06-08: 40 mg via INTRAVENOUS

## 2021-06-08 MED ORDER — PROPOFOL 500 MG/50ML IV EMUL
INTRAVENOUS | Status: DC | PRN
  Administered 2021-06-08: 200 ug/kg/min via INTRAVENOUS

## 2021-06-08 MED ORDER — IPRATROPIUM-ALBUTEROL 0.5-2.5 (3) MG/3ML IN SOLN
3.0000 mL | Freq: Once | RESPIRATORY_TRACT | Status: AC
  Administered 2021-06-08: 3 mL via RESPIRATORY_TRACT

## 2021-06-08 MED ORDER — IPRATROPIUM-ALBUTEROL 0.5-2.5 (3) MG/3ML IN SOLN
RESPIRATORY_TRACT | Status: AC
  Filled 2021-06-08: qty 3

## 2021-06-08 NOTE — Anesthesia Procedure Notes (Signed)
Date/Time: 06/08/2021 9:18 AM ?Performed by: Nelda Marseille, CRNA ?Pre-anesthesia Checklist: Patient identified, Emergency Drugs available, Suction available, Patient being monitored and Timeout performed ?Oxygen Delivery Method: Nasal cannula ? ? ? ? ?

## 2021-06-08 NOTE — Progress Notes (Signed)
Inform that she has a yeast infection commence on Diflucan 100 mg daily for 14 days

## 2021-06-08 NOTE — Op Note (Signed)
[facility] ?Gastroenterology ?Patient Name: Christina [name] ?Procedure Date: 06/08/2021 9:11 AM ?MRN: [id number] ?Account #: [id number] ?Date of Birth: [birth year] ?Admit Type: Outpatient ?Age: 86 ?Room: [facility] ENDO ROOM 4 ?Gender: Female ?Note Status: Finalized ?Instrument Name: Upper Endoscope [id number] ?Procedure:             Upper GI endoscopy ?Indications:           Preoperative assessment ?Providers:             [name] MD, MD ?Referring MD:          Jaci Standard. Rollene Rotunda (Referring MD) ?Medicines:             Monitored Anesthesia Care ?Complications:         No immediate complications. ?Procedure:             Pre-Anesthesia Assessment: ?                       - Prior to the procedure, a History and Physical was  ?                       performed, and patient medications, allergies and  ?                       sensitivities were reviewed. The patient's tolerance  ?                       of previous anesthesia was reviewed. ?                       - The risks and benefits of the procedure and the  ?                       sedation options and risks were discussed with the  ?                       patient. All questions were answered and informed  ?                       consent was obtained. ?                       - ASA Grade Assessment: II - A patient with mild  ?                       systemic disease. ?                       After obtaining informed consent, the endoscope was  ?                       passed under direct vision. Throughout the procedure,  ?                       the patient's blood pressure, pulse, and oxygen  ?                       saturations were monitored continuously. The Endoscope  ?                       was introduced  through the mouth, and advanced to the  ?                       third part of duodenum. The upper GI endoscopy was  ?                       accomplished with ease. The patient tolerated the  ?                       procedure well. ?Findings: ?     Diffuse,  white plaques were found in the upper third of the esophagus.  ?     Brushings for KOH prep were obtained in the upper third of the esophagus. ?     The examined duodenum was normal. ?     A 6 cm hiatal hernia was present. ?     The cardia and gastric fundus were normal on retroflexion. ?Impression:            - Esophageal plaques were found, suspicious for  ?                       candidiasis. Brushings performed. ?                       - Normal examined duodenum. ?                       - 6 cm hiatal hernia. ?Recommendation:        - Discharge patient to home (with escort). ?                       - Resume previous diet. ?                       - Continue present medications. ?                       - Return to my office PRN. ?Procedure Code(s):     --- Professional --- ?                       [phone], Esophagogastroduodenoscopy, flexible,  ?                       transoral; diagnostic, including collection of  ?                       specimen(s) by brushing or washing, when performed  ?                       (separate procedure) ?Diagnosis Code(s):     --- Professional --- ?                       K22.9, Disease of esophagus, unspecified ?                       K44.9, Diaphragmatic hernia without obstruction or  ?                       gangrene ?  Y22.336, Encounter for other preprocedural examination ?CPT copyright 2019 American Medical Association. All rights reserved. ?The codes documented in this report are preliminary and upon coder review may  ?be revised to meet current compliance requirements. ?[name], MD ?[name] MD, MD ?06/08/2021 9:22:35 AM ?This report has been signed electronically. ?Number of Addenda: 0 ?Note Initiated On: 06/08/2021 9:11 AM ?Estimated Blood Loss:  Estimated blood loss: none. ?     [facility] ?

## 2021-06-08 NOTE — H&P (Signed)
? ? ? ?Christina Bellows, MD ?[address], [place], [place], [place], [address] ?[address] [place], [place], [place], [address] ?Phone: [phone]  ?Fax: [phone] ? ?Primary Care Physician:  [name], FNP ? ? ?Pre-Procedure History & Physical: ?HPI:  Christina [name] is a 86 y.o. female is here for an endoscopy  ?  ?Past Medical History:  ?Diagnosis Date  ? Arthritis   ? COPD (chronic obstructive pulmonary disease) ([place])   ? Coronary artery disease   ? GERD (gastroesophageal reflux disease)   ? Hyperlipidemia   ? Hypertension   ? Macular degeneration   ? PAD (peripheral artery disease) ([place])   ? Sleep apnea   ? ? ?Past Surgical History:  ?Procedure Laterality Date  ? ABDOMINAL HYSTERECTOMY  1980  ? due to dysfunctional uterine bleeding  ? APPENDECTOMY  1980  ? BREAST SURGERY Left 2000  ? biopsy  ? CARDIAC CATHETERIZATION    ? COLONOSCOPY    ? CORONARY ARTERY BYPASS GRAFT  2006  ? HEMORRHOID SURGERY    ? LOWER EXTREMITY ANGIOGRAPHY Right 02/25/2017  ? Procedure: LOWER EXTREMITY ANGIOGRAPHY;  Surgeon: Algernon Huxley, MD;  Location: [facility];  Service: Cardiovascular;  Laterality: Right;  ? LOWER EXTREMITY INTERVENTION  02/25/2017  ? Procedure: LOWER EXTREMITY INTERVENTION;  Surgeon: Algernon Huxley, MD;  Location: [facility];  Service: Cardiovascular;;  ? LUNG BIOPSY  1999  ? Negative  ? TONSILLECTOMY    ? ? ?Prior to Admission medications   ?Medication Sig Start Date End Date Taking? Authorizing Provider  ?Ascorbic Acid (VITAMIN C) 1000 MG tablet Take 1,000 mg by mouth daily.   Yes [provider]  ?azelastine (ASTELIN) 0.1 % nasal spray Place 1 spray into both nostrils daily as needed. 04/17/20  Yes [provider]  ?budesonide-formoterol (SYMBICORT) 160-4.5 MCG/ACT inhaler INHALE 1 PUFF BY MOUTH TWICE A DAY 05/28/21  Yes Allyne Gee, MD  ?Calcium Carb-Cholecalciferol (CALCIUM 600+D3 PO) Take 1 tablet by mouth daily.   Yes [provider]   ?carboxymethylcellulose (REFRESH PLUS) 0.5 % SOLN 1 drop 3 (three) times daily as needed.   Yes [provider]  ?Coenzyme Q10 (COQ-10) 100 MG capsule Take 1 capsule (100 mg total) by mouth daily. 11/03/20  Yes [name], FNP  ?docusate sodium (COLACE) 100 MG capsule Take 200 mg by mouth at bedtime.   Yes [provider]  ?hydrochlorothiazide (HYDRODIURIL) 12.5 MG tablet Take 12.5 mg by mouth daily. 06/28/20 06/28/21 Yes [provider]  ?metoprolol tartrate 75 MG TABS Take 75 mg by mouth 2 (two) times daily. 04/11/21  Yes Wieting, Richard, MD  ?Multiple Vitamin (MULTI-VITAMIN DAILY PO) Take 1 tablet by mouth daily.   Yes [provider]  ?pantoprazole (PROTONIX) 40 MG tablet TAKE 1 TABLET (40 MG TOTAL) BY MOUTH ONCE DAILY TAKE 30 MINUTES BEFORE BREAKFAST. 11/03/20  Yes [name], FNP  ?rosuvastatin (CRESTOR) 20 MG tablet Take 1 tablet (20 mg total) by mouth daily. 11/03/20  Yes [name], FNP  ?tobramycin (TOBREX) 0.3 % ophthalmic solution  04/06/17  Yes [provider]  ?acetaminophen (TYLENOL) 500 MG tablet Take 500 mg by mouth every 8 (eight) hours as needed for mild pain or moderate pain.    [provider]  ?albuterol (VENTOLIN HFA) 108 (90 Base) MCG/ACT inhaler Inhale 2 puffs into the lungs every 6 (six) hours as needed for wheezing or shortness of breath. 04/11/21   Wieting, Richard,  MD  ?apixaban (ELIQUIS) 2.5 MG TABS tablet Take by mouth 2 (two) times daily.    [provider]  ? ? ?Allergies as of 05/30/2021 - Review Complete 05/29/2021  ?Allergen Reaction Noted  ? Baclofen Other (See Comments) 01/25/2015  ? Codeine Nausea Only 05/19/2010  ? Plavix [clopidogrel]  06/17/2017  ? Sulfa antibiotics Nausea Only 05/19/2010  ? Latex Rash 01/25/2015  ? ? ?Family History  ?Problem Relation Age of Onset  ? Hypertension Mother   ? Hyperlipidemia Mother   ? Alzheimer's disease Mother   ? CAD Mother   ? Heart attack Father   ? Lung disease Sister   ?  Heart disease Sister   ? Diabetes Paternal Grandfather   ?     Type 2  ? Hearing loss Son   ? ? ?Social History  ? ?Socioeconomic History  ? Marital status: Married  ?  Spouse name: Cliffton Mathis Bud  ? Number of children: 2  ? Years of education: 29  ? Highest education level: 12th grade  ?Occupational History  ? Occupation: retired  ?Tobacco Use  ? Smoking status: Former  ?  Packs/day: 0.50  ?  Years: 5.00  ?  Pack years: 2.50  ?  Types: Cigarettes  ?  Quit date: 04/24/2004  ?  Years since quitting: 17.1  ? Smokeless tobacco: Never  ?Vaping Use  ? Vaping Use: Never used  ?Substance and Sexual Activity  ? Alcohol use: No  ? Drug use: No  ? Sexual activity: Yes  ?Other Topics Concern  ? Not on file  ?Social History Narrative  ? Not on file  ? ?Social Determinants of Health  ? ?Financial Resource Strain: Low Risk   ? Difficulty of Paying Living Expenses: Not hard at all  ?Food Insecurity: Not on file  ?Transportation Needs: Not on file  ?Physical Activity: Not on file  ?Stress: Not on file  ?Social Connections: Not on file  ?Intimate Partner Violence: Not on file  ? ? ?Review of Systems: ?See HPI, otherwise negative ROS ? ?Physical Exam: ?BP (!) 154/98   Pulse 74   Temp (!) 96.9 ?F (36.1 ?C) (Temporal)   Resp 16   Ht '5\' 6"'$  (1.676 m)   Wt 74.5 kg   SpO2 96%   BMI 26.52 kg/m?  ?General:   Alert,  pleasant and cooperative in NAD ?Head:  Normocephalic and atraumatic. ?Neck:  Supple; no masses or thyromegaly. ?Lungs:  Clear throughout to auscultation, normal respiratory effort.    ?Heart:  +S1, +S2, Regular rate and rhythm, No edema. ?Abdomen:  Soft, nontender and nondistended. Normal bowel sounds, without guarding, and without rebound.   ?Neurologic:  Alert and  oriented x4;  grossly normal neurologically. ? ?Impression/Plan: ?Christina [name] is here for an endoscopy  to be performed for  evaluation of hiatal hernia ?   ?Risks, benefits, limitations, and alternatives regarding endoscopy have been reviewed  with the patient.  Questions have been answered.  All parties agreeable. ? ? ?Christina Bellows, MD  06/08/2021, 9:02 AM ? ?

## 2021-06-08 NOTE — Anesthesia Postprocedure Evaluation (Signed)
Anesthesia Post Note ? ?Patient: CAILEEN VERACRUZ ? ?Procedure(s) Performed: ESOPHAGOGASTRODUODENOSCOPY (EGD) WITH PROPOFOL ? ?Patient location during evaluation: PACU ?Anesthesia Type: General ?Level of consciousness: awake and alert, oriented and patient cooperative ?Pain management: pain level controlled ?Vital Signs Assessment: post-procedure vital signs reviewed and stable ?Respiratory status: spontaneous breathing, nonlabored ventilation and respiratory function stable ?Cardiovascular status: blood pressure returned to baseline and stable ?Postop Assessment: adequate PO intake ?Anesthetic complications: no ? ? ?No notable events documented. ? ? ?Last Vitals:  ?Vitals:  ? 06/08/21 0936 06/08/21 0946  ?BP: 134/69 130/90  ?Pulse: 85 (!) 112  ?Resp: (!) 27 16  ?Temp:    ?SpO2: 97% 100%  ?  ?Last Pain:  ?Vitals:  ? 06/08/21 0936  ?TempSrc:   ?PainSc: 0-No pain  ? ? ?  ?  ?  ?  ?  ?  ? ?Darrin Nipper ? ? ? ? ?

## 2021-06-08 NOTE — Anesthesia Preprocedure Evaluation (Addendum)
Anesthesia Evaluation  ?Patient identified by MRN, date of birth, ID band ?Patient awake ? ? ? ?Reviewed: ?Allergy & Precautions, NPO status , Patient's Chart, lab work & pertinent test results ? ?History of Anesthesia Complications ?Negative for: history of anesthetic complications ? ?Airway ?Mallampati: II ? ? ?Neck ROM: Full ? ? ? Dental ? ?(+) Partial Upper, Partial Lower ?  ?Pulmonary ?sleep apnea , COPD, former smoker (quit 2006),  ?  ?Pulmonary exam normal ?breath sounds clear to auscultation ? ? ? ? ? ? Cardiovascular ?hypertension, + CAD (s/p CABG) and + Peripheral Vascular Disease  ?Normal cardiovascular exam+ dysrhythmias (a fib on Eliquis) + Valvular Problems/Murmurs (severe TR)  ?Rhythm:Regular Rate:Normal ? ?ECG 04/09/21: A fib, long QT ? ?Echocardiogram 2D complete 04/10/21: ??1. Left ventricular ejection fraction, by estimation, is 50 to 55%. The left ventricle has low normal function. The left ventricle has no regional wall motion abnormalities. There is mild left ventricular hypertrophy. Left ventricular diastolic parameters are indeterminate.  ??2. Right ventricular systolic function is mildly reduced. The right ventricular size is mildly enlarged.  ??3. Left atrial size was moderately dilated.  ??4. Right atrial size was moderately dilated.  ??5. The mitral valve is normal in structure. Mild to moderate mitral valve regurgitation. No evidence of mitral stenosis.  ??6. Tricuspid valve regurgitation?is severe.  ??7. The aortic valve is normal in structure. Aortic valve regurgitation is not visualized. No aortic stenosis is present.  ? ?Myocardial Perfusion 09/30/15: Borderline inferior defect no evidence of redistribution normal left ventricular function at 55% normal size of the ventricular cavity inferior wall may be artifact this is probably a normal scan but consider further evaluation the patient develops or continues to have symptoms ?  ?Neuro/Psych ?negative  neurological ROS ?   ? GI/Hepatic ?hiatal hernia, GERD  ,  ?Endo/Other  ?negative endocrine ROS ? Renal/GU ?negative Renal ROS  ? ?  ?Musculoskeletal ? ?(+) Arthritis ,  ? Abdominal ?  ?Peds ? Hematology ?negative hematology ROS ?(+)   ?Anesthesia Other Findings ?Cardiology note 05/29/21:  ?1 angina unclear etiology recommend conservative medical therapy metoprolol HCTZ ?2 atrial fibrillation paroxysmal continue Eliquis for anticoagulation metoprolol for rate advised patient to stop Eliquis before surgery ?3 COPD by history recommend inhalers as necessary advised patient refrain from tobacco abuse ?4 obesity modest recommend modest weight loss exercise portion control ?5 hypertension reasonably managed continue HCTZ metoprolol ?6 GERD patient currently on Protonix therapy for reflux type symptoms ?7 hyperlipidemia continue Crestor therapy for lipid management ?8 edema recommend support stockings elevation continue HCTZ will recommend increasing at future visit if needed ?9 patient follow-up 6 months ? ? Reproductive/Obstetrics ? ?  ? ? ? ? ? ? ? ? ? ? ? ? ? ?  ?  ? ? ? ? ? ? ? ?Anesthesia Physical ?Anesthesia Plan ? ?ASA: 3 ? ?Anesthesia Plan: General  ? ?Post-op Pain Management:   ? ?Induction: Intravenous ? ?PONV Risk Score and Plan: 3 and Propofol infusion, TIVA and Treatment may vary due to age or medical condition ? ?Airway Management Planned: Natural Airway ? ?Additional Equipment:  ? ?Intra-op Plan:  ? ?Post-operative Plan:  ? ?Informed Consent: I have reviewed the patients History and Physical, chart, labs and discussed the procedure including the risks, benefits and alternatives for the proposed anesthesia with the patient or authorized representative who has indicated his/her understanding and acceptance.  ? ? ? ? ? ?Plan Discussed with: CRNA ? ?Anesthesia Plan Comments: (LMA/GETA backup discussed.  Patient  consented for risks of anesthesia including but not limited to:  ?- adverse reactions to  medications ?- damage to eyes, teeth, lips or other oral mucosa ?- nerve damage due to positioning  ?- sore throat or hoarseness ?- damage to heart, brain, nerves, lungs, other parts of body or loss of life ? ?Informed patient about role of CRNA in peri- and intra-operative care.  Patient voiced understanding.)  ? ? ? ? ? ? ?Anesthesia Quick Evaluation ? ?

## 2021-06-08 NOTE — Transfer of Care (Signed)
Immediate Anesthesia Transfer of Care Note ? ?Patient: Christina [name] ? ?Procedure(s) Performed: ESOPHAGOGASTRODUODENOSCOPY (EGD) WITH PROPOFOL ? ?Patient Location: PACU ? ?Anesthesia Type:General ? ?Level of Consciousness: awake, alert  and oriented ? ?Airway & Oxygen Therapy: Patient Spontanous Breathing and Patient connected to nasal cannula oxygen ? ?Post-op Assessment: Report given to RN and Post -op Vital signs reviewed and stable ? ?Post vital signs: Reviewed and stable ? ?Last Vitals:  ?Vitals Value Taken Time  ?BP 128/76 06/08/21 0926  ?Temp 35.9 ?C 06/08/21 0926  ?Pulse 87 06/08/21 0928  ?Resp 23 06/08/21 0928  ?SpO2 95 % 06/08/21 0928  ?Vitals shown include unvalidated device data. ? ?Last Pain:  ?Vitals:  ? 06/08/21 0926  ?TempSrc: Temporal  ?PainSc: Asleep  ?   ? ?  ? ?Complications: No notable events documented. ?

## 2021-06-09 ENCOUNTER — Other Ambulatory Visit: Payer: Self-pay | Admitting: Family Medicine

## 2021-06-09 ENCOUNTER — Encounter: Payer: Self-pay | Admitting: Gastroenterology

## 2021-06-09 ENCOUNTER — Ambulatory Visit (INDEPENDENT_AMBULATORY_CARE_PROVIDER_SITE_OTHER): Payer: Medicare Other | Admitting: Family Medicine

## 2021-06-09 DIAGNOSIS — I10 Essential (primary) hypertension: Secondary | ICD-10-CM

## 2021-06-09 MED ORDER — METOPROLOL TARTRATE 75 MG PO TABS: 75.0000 mg | ORAL_TABLET | Freq: Two times a day (BID) | ORAL | 1 refills | Status: DC

## 2021-06-09 NOTE — Progress Notes (Signed)
Patient presented for blood pressure medication refill; was changed to higher dose metoprolol 75 mg BID at previous ER follow up and blood pressure checked today is stable with 75 mg BID. Refill provided; 1 month appointment with PCP scheduled.  ?

## 2021-06-09 NOTE — Telephone Encounter (Signed)
Requested medication (s) are due for refill today:   yes ? ?Requested medication (s) are on the active medication list:   Yes ? ?Future visit scheduled:   No   Seen a mo. ago ? ? ?Last ordered: 04/11/2021 #60, 0 refills ? ?Last prescribed by another provider from a hospital visit reason returned.  ? ?Requested Prescriptions  ?Pending Prescriptions Disp Refills  ? Metoprolol Tartrate 75 MG TABS [Pharmacy Med Name: METOPROLOL TARTRATE 75 MG TAB] 60 tablet 0  ?  Sig: TAKE 1 TABLET BY MOUTH TWICE A DAY  ?  ? Cardiovascular:  Beta Blockers Failed - 06/09/2021 11:56 AM  ?  ?  Failed - Last BP in normal range  ?  BP Readings from Last 1 Encounters:  ?06/08/21 130/90  ?  ?  ?  ?  Failed - Last Heart Rate in normal range  ?  Pulse Readings from Last 1 Encounters:  ?06/08/21 (!) 112  ?  ?  ?  ?  Passed - Valid encounter within last 6 months  ?  Recent Outpatient Visits   ? ?      ? 1 month ago Hospital discharge follow-up  ? [facility] [place], [place], PA-C  ? 2 months ago Primary hypertension  ? [facility] [place], [place], PA-C  ? 2 months ago Primary hypertension  ? [facility] [name], FNP  ? 7 months ago Primary hypertension  ? [facility] [name] T, FNP  ? 1 year ago Annual physical exam  ? [facility] [place], Clearnce Sorrel, [place]  ? ?  ?  ? ? ?  ?  ?  ? ?

## 2021-06-09 NOTE — Telephone Encounter (Signed)
Received medical clearance from [name], FNP. Pt's risk assessment is medium and is optimized for surgery. Pt has hx of elevated serum, creatnine, iron def. Anemia, and pre-diabetes.  ?

## 2021-06-12 ENCOUNTER — Other Ambulatory Visit: Payer: Self-pay

## 2021-06-12 ENCOUNTER — Telehealth: Payer: Self-pay

## 2021-06-12 MED ORDER — FLUCONAZOLE 100 MG PO TABS: 100.0000 mg | ORAL_TABLET | Freq: Every day | ORAL | 0 refills | Status: DC

## 2021-06-12 NOTE — Telephone Encounter (Signed)
-----   Message from [name], MD sent at 06/08/2021 12:50 PM EDT ----- ?Inform that she has a yeast infection commence on Diflucan 100 mg daily for 14 days ?

## 2021-06-12 NOTE — Telephone Encounter (Signed)
Left vm regarding EGD results per Dr. Vicente Males. Diflucan '100mg'$  has been sent to [facility].  ?

## 2021-06-21 ENCOUNTER — Ambulatory Visit (INDEPENDENT_AMBULATORY_CARE_PROVIDER_SITE_OTHER): Payer: Medicare Other

## 2021-06-21 DIAGNOSIS — G4733 Obstructive sleep apnea (adult) (pediatric): Secondary | ICD-10-CM | POA: Diagnosis not present

## 2021-06-21 NOTE — Progress Notes (Signed)
95 percentile pressure 12 ? ? 95th percentile leak 62.2 ? ? apnea index 1.7 /hr ? apnea-hypopnea index  2.6 /hr ? ? total days used  >4 hr 88 days ? total days used <4 hr 1 days ? ?Total compliance 98 percent ? ?She is doing great no problems or questions at this time  ?Pt was seen by Claiborne Billings  RRT/RCP  from [facility]  ?

## 2021-06-24 DIAGNOSIS — G4733 Obstructive sleep apnea (adult) (pediatric): Secondary | ICD-10-CM | POA: Diagnosis not present

## 2021-06-29 ENCOUNTER — Telehealth: Payer: Self-pay

## 2021-06-29 NOTE — Progress Notes (Signed)
Chronic Care Management APPOINTMENT REMINDER  Christina [name] was reminded to have all medications, supplements and any blood glucose and blood pressure readings available for review with Junius Argyle, Pharm. D, at her telephone visit on 06/30/2021 at 8:30 am.  Patient Confirm Appointment.  [place] Pharmacist Assistant [phone]

## 2021-06-30 ENCOUNTER — Ambulatory Visit (INDEPENDENT_AMBULATORY_CARE_PROVIDER_SITE_OTHER): Payer: Medicare Other

## 2021-06-30 DIAGNOSIS — I480 Paroxysmal atrial fibrillation: Secondary | ICD-10-CM

## 2021-06-30 DIAGNOSIS — J449 Chronic obstructive pulmonary disease, unspecified: Secondary | ICD-10-CM

## 2021-06-30 NOTE — Patient Instructions (Signed)
Visit Information It was great speaking with you today!  Please let me know if you have any questions about our visit.   Goals Addressed             This Visit's Progress    Track and Manage My Triggers-COPD   On track    Timeframe:  Long-Range Goal Priority:  High Start Date: 04/03/2021                           Expected End Date: 04/03/2022                      Follow Up within 30 days   - eliminate smoking in my home - identify and remove indoor air pollutants - limit outdoor activity during cold weather    Why is this important?   Triggers are activities or things, like tobacco smoke or cold weather, that make your COPD (chronic obstructive pulmonary disease) flare-up.  Knowing these triggers helps you plan how to stay away from them.  When you cannot remove them, you can learn how to manage them.     Notes:         Patient Care Plan: General Pharmacy (Adult)     Problem Identified: Hypertension, Hyperlipidemia, GERD, COPD, Chronic Kidney Disease, and Osteopenia   Priority: High     Long-Range Goal: Patient-Specific Goal   Start Date: 04/10/2021  Expected End Date: 04/11/2022  This Visit's Progress: On track  Recent Progress: On track  Priority: High  Note:   Current Barriers:  Unable to self administer medications as prescribed  Pharmacist Clinical Goal(s):  Patient will maintain control of blood pressure as evidenced by BP less than 140/90  through collaboration with PharmD and provider.   Interventions: 1:1 collaboration with [name], FNP regarding development and update of comprehensive plan of care as evidenced by provider attestation and co-signature Inter-disciplinary care team collaboration (see longitudinal plan of care) Comprehensive medication review performed; medication list updated in electronic medical record  Hypertension (BP goal <140/90) -Controlled -Current treatment: HCTZ 12.5 mg daily: Appropriate, Effective, Safe, Accessible   Metoprolol tartrate 75 mg twice daily: Appropriate, Effective, Safe, Accessible  -Medications previously tried: Amlodipine  -Current home readings:   5/20: 127/73, pulse 62   5/19: 133/69, pulse 68  5/15: 141/72, pulse 61  -Denies hypotensive/hypertensive symptoms -Would benefit from ACEi or ARB given CKD, defer for now.  -Recommended to continue current medication  Atrial Fibrillation (Goal: prevent stroke and major bleeding) -Controlled -CHADSVASC: 5 -Current treatment: Rate control: Metoprolol tartrate 75 mg twice daily: Anticoagulation: Eliquis 2.5 mg twice daily -Medications previously tried: NA -Query effective dose of Eliquis given renal function. Cr <1.5, Age >80, Weight > 60 kg. Patient only meets 1 criteria for reduced dose of Eliquis. Unclear if there is another factor for using reduced dose in A-Fib. Will discuss with Cardiology.  -Some affordability concerns with Eliquis, will need to wait for patient to meet out of pocket requirement prior to starting patient assistance.  -Recommended to continue current medication  Hyperlipidemia: (LDL goal < 70) -Uncontrolled -History of PAD, PVD, CAD s/p CABG x 3 (2006)  -Current treatment: Rosuvastatin 20 mg daily: Appropriate, Effective, Safe, Accessible  -Medications previously tried: Simvastatin  -Recommended to continue current medication  COPD (Goal: control symptoms and prevent exacerbations) -Controlled -Current treatment  ProAir HFA 2 puffs every 6 hours as needed: Appropriate, Effective, Safe, Accessible  Symbicort 1  puff twice daily : Appropriate, Query Effective -Medications previously tried: NA  -Exacerbations requiring treatment in last 6 months: Yes hospitalized for pneumonia 04/09/21 -Patient reports consistent use of maintenance inhaler -Frequency of rescue inhaler use: 1-2x weekly  -She is adherent to her Symbicort, but does report concerns for Thrush which is being treated with fluconazole. Reviewed importance  of rinsing mouth after Symbicort use.  -Recommended to continue current medication  Osteoarthritis (Goal: Prevent inflammation) -Controlled -Current treatment  Acetaminophen 500 mg every 8 hours as needed -Medications previously tried: NA  -Recommended to continue current medication  Patient Goals/Self-Care Activities Patient will:  - check blood pressure weekly, document, and provide at future appointments  Follow Up Plan:  Telephone follow up appointment with care management team member scheduled for:  09/19/2021 at 8:30 AM      Patient agreed to services and verbal consent obtained.   The patient verbalized understanding of instructions, educational materials, and care plan provided today and DECLINED offer to receive copy of patient instructions, educational materials, and care plan.   Junius Argyle, PharmD, [place], CPP  Clinical Pharmacist Practitioner  [facility] [phone]

## 2021-06-30 NOTE — Progress Notes (Signed)
Chronic Care Management Pharmacy Note  06/30/2021 Name:  Christina [name] MRN:  [id number] DOB:  July 27, 1935  Summary: Patient presents for CCM Follow-up. She was recently hospitalized for pneumonia and developed A-Fib. She has multiple medications including stopping amlodipine, starting Eliquis 2.5 mg twice daily. She is adherent to her Symbicort, but does report concerns for Christina [name] which is being treated with fluconazole.   Recommendations/Changes made from today's visit: Recommend increasing Eliquis to 5 mg twice daily. Will discuss with Cardiology.   Plan: CPP follow-up 3 months  Subjective: Christina [name] is an 86 y.o. year old female who is a primary patient of Christina Sprout, FNP.  The CCM team was consulted for assistance with disease management and care coordination needs.    Engaged with patient by telephone for follow up visit in response to provider referral for pharmacy case management and/or care coordination services.   Consent to Services:  The patient was given information about Chronic Care Management services, agreed to services, and gave verbal consent prior to initiation of services.  Please see initial visit note for detailed documentation.   Patient Care Team: Christina Sprout, FNP as PCP - General (Family Medicine) [name] as Consulting Physician (Pulmonary Disease) [name], MD as Consulting Physician (Ophthalmology)  Recent office visits: 04/17/21: Patient presented to [name], PA-C for hospital follow-up.   03/23/2021 Christina Joe FNP (PCP) AMB Referral to [place], No Medication Changes noted, Return in about 2 weeks    Recent consult visits: 04/28/21: Patient persented to Dr. [name] (Cardiology).    02/15/2021 Dr. [name] MD (Cardiology) Stop Simvastatin,follow-up in 4 months 01/23/2021 Dr. [name] MD (Vascular Surgery) No Medication Changes noted, Return in 6 months 01/16/2021 [name] NP  (Pulmonology) No Medication Changes noted   Hospital visits: 06/08/21: Patient admitted for endoscopy  3/5-04/11/21: Patient hospitalized for COPD exacerbation. Amlodipine stopped.    Objective:  Lab Results  Component Value Date   CREATININE 1.04 (H) 04/11/2021   BUN 29 (H) 04/11/2021   GFRNONAA 53 (L) 04/11/2021   GFRAA 47 (L) 03/18/2020   NA 143 04/11/2021   K 4.5 04/11/2021   CALCIUM 9.0 04/11/2021   CO2 25 04/11/2021   GLUCOSE 152 (H) 04/11/2021    Lab Results  Component Value Date/Time   HGBA1C 5.9 (H) 03/18/2020 10:59 AM   HGBA1C 5.8 (H) 02/20/2019 09:58 AM   HGBA1C 6.0 12/28/2013 12:00 AM    Last diabetic Eye exam: No results found for: HMDIABEYEEXA  Last diabetic Foot exam: No results found for: HMDIABFOOTEX   Lab Results  Component Value Date   CHOL 159 03/18/2020   HDL 59 03/18/2020   LDLCALC 75 03/18/2020   TRIG 143 03/18/2020   CHOLHDL 3.0 02/20/2019       Latest Ref Rng & Units 03/18/2020   10:59 AM 07/14/2019   11:56 AM 02/20/2019    9:58 AM  Hepatic Function  Total Protein 6.0 - 8.5 g/dL 6.5   6.6   6.5    Albumin 3.6 - 4.6 g/dL 4.5   4.3   4.4    AST 0 - 40 IU/L _0 ALT 0 - 32 IU/L _1 Alk Phosphatase 44 - 121 IU/L 92   85   85    Total Bilirubin 0.0 - 1.2 mg/dL 0.5   0.4   0.5      Lab Results  Component Value Date/Time   TSH 4.590 (H) 03/18/2020 10:59 AM   TSH 3.470 02/20/2019 09:58 AM       Latest Ref Rng & Units 04/11/2021    6:02 AM 04/09/2021    8:41 PM 03/18/2020   10:59 AM  CBC  WBC 4.0 - 10.5 K/uL 7.6   7.1   5.8    Hemoglobin 12.0 - 15.0 g/dL 11.7   12.3   13.4    Hematocrit 36.0 - 46.0 % 36.6   38.4   40.0    Platelets 150 - 400 K/uL 195   189   153      No results found for: VD25OH  Clinical ASCVD: No  The ASCVD Risk score (Arnett DK, et al., 2019) failed to calculate for the following reasons:   The 2019 ASCVD risk score is only valid for ages 42 to 64       03/23/2021    1:28 PM 03/18/2020    10:05 AM 02/23/2020    3:54 PM  Depression screen PHQ 2/9  Decreased Interest 1 0 0  Down, Depressed, Hopeless 1 0 0  PHQ - 2 Score 2 0 0  Altered sleeping 3 3   Tired, decreased energy 3 1   Change in appetite 0 0   Feeling bad or failure about yourself  0 0   Trouble concentrating 0 0   Moving slowly or fidgety/restless 1 0   Suicidal thoughts 1 0   PHQ-9 Score 10 4   Difficult doing work/chores Somewhat difficult Not difficult at all    Social History   Tobacco Use  Smoking Status Former   Packs/day: 0.50   Years: 5.00   Pack years: 2.50   Types: Cigarettes   Quit date: 04/24/2004   Years since quitting: 17.1  Smokeless Tobacco Never   BP Readings from Last 3 Encounters:  06/09/21 130/78  06/08/21 130/90  05/29/21 (!) 154/73   Pulse Readings from Last 3 Encounters:  06/09/21 92  06/08/21 (!) 112  05/29/21 85   Wt Readings from Last 3 Encounters:  06/08/21 164 lb 5.3 oz (74.5 kg)  05/29/21 166 lb 6.4 oz (75.5 kg)  05/01/21 164 lb 6.4 oz (74.6 kg)   BMI Readings from Last 3 Encounters:  06/08/21 26.52 kg/m  05/29/21 26.86 kg/m  05/01/21 26.53 kg/m    Assessment/Interventions: Review of patient past medical history, allergies, medications, health status, including review of consultants reports, laboratory and other test data, was performed as part of comprehensive evaluation and provision of chronic care management services.   SDOH:  (Social Determinants of Health) assessments and interventions performed: Yes   SDOH Screenings   Alcohol Screen: Not on file  Depression (PHQ2-9): Medium Risk   PHQ-2 Score: 10  Financial Resource Strain: Low Risk    Difficulty of Paying Living Expenses: Not hard at all  Food Insecurity: Not on file  Housing: Not on file  Physical Activity: Not on file  Social Connections: Not on file  Stress: Not on file  Tobacco Use: Medium Risk   Smoking Tobacco Use: Former   Smokeless Tobacco Use: Never   Passive Exposure: Not on  file  Transportation Needs: Not on file    CCM Care Plan  Allergies  Allergen Reactions   Baclofen Other (See Comments)   Codeine Nausea Only   Plavix [Clopidogrel]     brusing all over   Sulfa Antibiotics Nausea Only   Latex Rash    Medications Reviewed Today  Reviewed by Julieta Bellini, CMA (Certified Medical Assistant) on 06/09/21 at 70  Med List Status: <None>   Medication Order Taking? Sig Documenting Provider Last Dose Status Informant  acetaminophen (TYLENOL) 500 MG tablet [id number] Yes Take 500 mg by mouth every 8 (eight) hours as needed for mild pain or moderate pain. [provider] Taking Active Self           Med Note Rosemarie Beath, MELISSA B   Wed Feb 20, 2017  1:14 PM)    albuterol (VENTOLIN HFA) 108 (90 Base) MCG/ACT inhaler [id number] Yes Inhale 2 puffs into the lungs every 6 (six) hours as needed for wheezing or shortness of breath. Loletha Grayer, MD Taking Active   apixaban (ELIQUIS) 2.5 MG TABS tablet [id number] Yes Take by mouth 2 (two) times daily. [provider] Taking Active   Ascorbic Acid (VITAMIN C) 1000 MG tablet [id number] Yes Take 1,000 mg by mouth daily. [provider] Taking Active Self  azelastine (ASTELIN) 0.1 % nasal spray [id number] Yes Place 1 spray into both nostrils daily as needed. [provider] Taking Active Self  budesonide-formoterol (SYMBICORT) 160-4.5 MCG/ACT inhaler [id number] Yes INHALE 1 PUFF BY MOUTH TWICE A DAY Allyne Gee, MD Taking Active   Calcium Carb-Cholecalciferol (CALCIUM 600+D3 PO) [id number] Yes Take 1 tablet by mouth daily. [provider] Taking Active Self  carboxymethylcellulose (REFRESH PLUS) 0.5 % SOLN [id number] Yes 1 drop 3 (three) times daily as needed. [provider] Taking Active Self  Coenzyme Q10 (COQ-10) 100 MG capsule [id number] Yes Take 1 capsule (100 mg total) by mouth daily. Christina Sprout, FNP Taking Active Self  docusate sodium (COLACE) 100 MG capsule  [id number] Yes Take 200 mg by mouth at bedtime. [provider] Taking Active Self  hydrochlorothiazide (HYDRODIURIL) 12.5 MG tablet [id number] Yes Take 12.5 mg by mouth daily. [provider] Taking Active Self  Metoprolol Tartrate 75 MG TABS [id number] Yes Take 75 mg by mouth 2 (two) times daily. Christina Sprout, FNP Taking Active   Multiple Vitamin (MULTI-VITAMIN DAILY PO) [id number] Yes Take 1 tablet by mouth daily. [provider] Taking Active Self  pantoprazole (PROTONIX) 40 MG tablet [id number] Yes TAKE 1 TABLET (40 MG TOTAL) BY MOUTH ONCE DAILY TAKE 30 MINUTES BEFORE BREAKFAST. Christina Sprout, FNP Taking Active Self  rosuvastatin (CRESTOR) 20 MG tablet [id number] Yes Take 1 tablet (20 mg total) by mouth daily. Christina [name] T, FNP Taking Active Self  tobramycin (TOBREX) 0.3 % ophthalmic solution [id number] Yes  [provider] Taking Active Self            Patient Active Problem List   Diagnosis Date Noted   Impaired fasting glucose 04/11/2021   Pneumonia 04/09/2021   Atrial fibrillation ([place])    Primary hypertension 11/24/2020   Need for influenza vaccination 11/03/2020   Hiatal hernia with gastroesophageal reflux disease without esophagitis 11/03/2020   Atherosclerosis of both carotid arteries 03/18/2020   Memory loss 03/18/2020   Hand arthritis 03/18/2020   Neck pain 03/18/2020   Aortic atherosclerosis ([place]) 10/26/2016   Claudication ([place]) 10/26/2016   COPD exacerbation ([place]) 10/22/2016   OSA on CPAP 09/16/2015   Abnormal chest x-ray 01/25/2015   Atrophic vaginitis 01/25/2015   Coronary artery disease 01/25/2015   Stage 3a chronic kidney disease ([place]) 01/25/2015   Bloodgood disease 01/25/2015   Blood glucose elevated 01/25/2015   Hyperlipidemia 01/25/2015   Cannot sleep 01/25/2015   Hypertension 11/02/2014   Acid reflux 11/02/2014  Arthritis 10/19/2014   Adenomatous colon polyp 07/27/2013   Gastric catarrh 07/27/2013   Hiatal hernia  07/27/2013   Barrett esophagus 07/07/2013   Osteopenia 12/13/2009   PAD (peripheral artery disease) ([place]) 09/24/2007   DD (diverticular disease) 12/19/2005    Immunization History  Administered Date(s) Administered   Fluad Quad(high Dose 65+) 10/20/2018, 10/21/2019, 11/03/2020   Influenza Split 12/01/2005, 11/17/2007   Influenza, High Dose Seasonal PF 10/25/2016, 10/21/2019   Influenza-Unspecified 10/06/2013, 10/07/2015, 10/25/2016, 10/28/2017   PFIZER(Purple Top)SARS-COV-2 Vaccination 02/27/2019, 03/20/2019, 11/09/2019   Pneumococcal Conjugate-13 12/28/2013   Pneumococcal Polysaccharide-23 05/31/2003   Zoster, Live 03/17/2008    Conditions to be addressed/monitored:  Hypertension, Hyperlipidemia, Atrial Fibrillation, GERD, COPD, Chronic Kidney Disease, and Osteopenia  There are no care plans that you recently modified to display for this patient.     Medication Assistance: None required.  Patient affirms current coverage meets needs.  Compliance/Adherence/Medication fill history: Care Gaps: Shingrix Vaccine Dexa Scan COVID-19 Vaccine  Star-Rating Drugs: Rosuvastatin 20 mg last filled 01/21/2021 [facility].  Patient's preferred pharmacy is:  [id number] [place], [id number]: [phone]: [phone] Uses pill box? Yes Pt endorses 100% compliance  We discussed: Current pharmacy is preferred with insurance plan and patient is satisfied with pharmacy services Patient decided to: Continue current medication management strategy  Care Plan and Follow Up Patient Decision:  Patient agrees to Care Plan and Follow-up.  Plan: Telephone follow up appointment with care management team member scheduled for:  09/19/2021 at 8:30 AM  AJunius Argyle PharmD, B[phone]

## 2021-07-05 ENCOUNTER — Encounter: Payer: Self-pay | Admitting: Surgery

## 2021-07-05 ENCOUNTER — Telehealth: Payer: Self-pay

## 2021-07-05 ENCOUNTER — Ambulatory Visit (INDEPENDENT_AMBULATORY_CARE_PROVIDER_SITE_OTHER): Payer: Medicare Other | Admitting: Surgery

## 2021-07-05 ENCOUNTER — Other Ambulatory Visit: Payer: Self-pay

## 2021-07-05 VITALS — BP 162/82 | HR 89 | Temp 97.8°F | Ht 66.0 in | Wt 165.2 lb

## 2021-07-05 DIAGNOSIS — M1990 Unspecified osteoarthritis, unspecified site: Secondary | ICD-10-CM

## 2021-07-05 DIAGNOSIS — I4891 Unspecified atrial fibrillation: Secondary | ICD-10-CM

## 2021-07-05 DIAGNOSIS — E785 Hyperlipidemia, unspecified: Secondary | ICD-10-CM

## 2021-07-05 DIAGNOSIS — K449 Diaphragmatic hernia without obstruction or gangrene: Secondary | ICD-10-CM

## 2021-07-05 DIAGNOSIS — I10 Essential (primary) hypertension: Secondary | ICD-10-CM

## 2021-07-05 DIAGNOSIS — J449 Chronic obstructive pulmonary disease, unspecified: Secondary | ICD-10-CM

## 2021-07-05 NOTE — Progress Notes (Unsigned)
Outpatient Surgical Follow Up  07/05/2021  Christina [name] is an 86 y.o. female.   F/U   HPI: Christina [name] is a 86 y.o. female medical history significant for coronary artery disease, COPD, pretension. She Is being referred for evaluation of paraesophageal hernia. She Does have significant history of reflux disease and had prior esophagitis with Schatzki ring that was dilated at some point in time.  She Feels acid taste in her mouth chest pressure and some hoarseness.  She feels heartburn as well.  Currently is taking pantoprazole  She had a CT scan and swallow recently noting a large hiatal hernia with about half of the stomach within the mediastinum, pers. Reviewed. There was some questionable stricture but endoscopic imaging disprove stricture but confirmed large hernia. She also had esophageal candidiasis and was appropriately treated w diflucan by dr. Vicente Males.  She Does have a significant history of coronary artery disease status post CABG in 2006 Surgical history include hysterectomy and appendectomy. CBC and BMP is normal other than mild ovation of BUN of 29 and creatinine of 1.04.  Glycemia is 152. She is functional and she is able to walk and do all her ADLs.  She is competent. She  Does have some dyspnea on exertion Her heart doctor is Dr. [name] is currently anticoagulated and he has optimized her preop.   Past Medical History:  Diagnosis Date   Arthritis    COPD (chronic obstructive pulmonary disease) ([place])    Coronary artery disease    GERD (gastroesophageal reflux disease)    Hyperlipidemia    Hypertension    Macular degeneration    PAD (peripheral artery disease) ([place])    Sleep apnea     Past Surgical History:  Procedure Laterality Date   ABDOMINAL HYSTERECTOMY  1980   due to dysfunctional uterine bleeding   APPENDECTOMY  1980   BREAST SURGERY Left 2000   biopsy   CARDIAC CATHETERIZATION     COLONOSCOPY     CORONARY ARTERY BYPASS GRAFT  2006    ESOPHAGOGASTRODUODENOSCOPY (EGD) WITH PROPOFOL N/A 06/08/2021   Procedure: ESOPHAGOGASTRODUODENOSCOPY (EGD) WITH PROPOFOL;  Surgeon: [name], MD;  Location[facility] ENDOSCOPY;  Service: Gastroenterology;  Laterality: N/A;   HEMORRHOID SURGERY     LOWER EXTREMITY ANGIOGRAPHY Right 02/25/2017   Procedure: LOWER EXTREMITY ANGIOGRAPHY;  Surgeon: Algernon Huxley, MD;  Location: [facility];  Service: Cardiovascular;  Laterality: Right;   LOWER EXTREMITY INTERVENTION  02/25/2017   Procedure: LOWER EXTREMITY INTERVENTION;  Surgeon: Algernon Huxley, MD;  Location: [facility];  Service: Cardiovascular;;   LUNG BIOPSY  1999   Negative   TONSILLECTOMY      Family History  Problem Relation Age of Onset   Hypertension Mother    Hyperlipidemia Mother    Alzheimer's disease Mother    CAD Mother    Heart attack Father    Lung disease Sister    Heart disease Sister    Diabetes Paternal Grandfather        Type 2   Hearing loss Son     Social History:  reports that she quit smoking about 17 years ago. Her smoking use included cigarettes. She has a 2.50 pack-year smoking history. She has never used smokeless tobacco. She reports that she does not drink alcohol and does not use drugs.  Allergies:  Allergies  Allergen Reactions   Baclofen Other (See Comments)   Codeine Nausea Only   Plavix [Clopidogrel]     brusing  all over   Sulfa Antibiotics Nausea Only   Latex Rash    Medications reviewed.    ROS Full ROS performed and is otherwise negative other than what is stated in HPI   There were no vitals taken for this visit.  Physical Exam CONSTITUTIONAL: NAd. EYES: Pupils are equal, round, , Sclera are non-icteric. EARS, NOSE, MOUTH AND THROAT: The oral mucosa is pink and moist. Hearing is intact to voice. LYMPH NODES:  Lymph nodes in the neck are normal. RESPIRATORY:  Lungs are clear. There is normal respiratory effort, with equal breath sounds bilaterally, and without  pathologic use of accessory muscles. CARDIOVASCULAR: Heart is regular without murmurs, gallops, or rubs. GI: The abdomen is  soft, nontender, and nondistended. There are no palpable masses. There is no hepatosplenomegaly. There are normal bowel sounds in all quadrants. GU: Rectal deferred.   MUSCULOSKELETAL: Normal muscle strength and tone. No cyanosis or edema.   SKIN: Turgor is good and there are no pathologic skin lesions or ulcers. NEUROLOGIC: Motor and sensation is grossly normal. Cranial nerves are grossly intact. PSYCH:  Oriented to person, place and time. Affect is normal.     Assessment/Plan: 86 year old fairly functional female with large paraesophageal hernia that is symptomatic.  Discussed with her in detail.  Given her symptoms I do think that this will need to be repaired  I do think that she will benefit from robotic repair of paraesophageal type III w mesh and Nissen fundoplication. Clinically she dos not have dysphagia and endoscopy shows a patent esophagus. Surgery d/w the pt in detail, risks, benefits and possible complications ( including but not limited to bleeding, infection, bowel and esophageal injuries, prolonged hospitalization)  She understands and wishes to proceed. We will stop eliquis 3 days before surgery Please note that I spent greater than 40 minutes in this encounter including coordination of her care, placing orders, personally reviewing imaging studies, counseling the patient and performing appropriate documentation   Caroleen Hamman, MD [facility]

## 2021-07-05 NOTE — Patient Instructions (Addendum)
Do not take your Eliquis three days prior to your surgery.    Eating Plan After Nissen Fundoplication After a Nissen fundoplication procedure, it is common to have some difficulty swallowing. The part of your body that moves food and liquid from your mouth to your stomach (esophagus) will be swollen and may feel tight. It will take several weeks or months for your esophagus and stomach to heal. By following a special eating plan, you can prevent problems such as pain, swelling or pressure in the abdomen (bloating), gas, nausea, or diarrhea. What are tips for following this plan? Cooking Cook all foods until they are soft. Remove skins and seeds from fruits and vegetables before eating. Remove skin and gristle from meats. Grind or finely mince meats before eating. Avoid over-cooking meat. Dry, tough meat is more difficult to swallow. Avoid using oil when cooking, or use only a small amount of oil. Avoid using seasoning when cooking, or use only a small amount of seasoning. Toast bread before eating. This makes it easier to swallow. Meal planning  Eat 6-8 small meals throughout the day. Right after the surgery, have a few meals that are only clear liquids. Clear liquids include: Water. Clear fruit juice, no pulp. Chicken, beef, or vegetable broth. Gelatin. Decaffeinated tea or coffee without milk. Popsicles or shaved ice. Depending on your progress, you may move to a full liquid diet as told by your health care provider. This includes clear liquids and the following: Dairy and alternative milks, such as soy milk. Strained creamed soups. Ice cream or sherbet. Pudding. Nutritional supplement drinks. Yogurt. A few days after surgery, you may be able to start eating a diet of soft foods. You may need to eat according to this plan for several weeks. Do not eat sweets or sweetened drinks at the beginning of a meal. Doing that may cause your stomach to empty faster than it should (dumping  syndrome). Lifestyle Always sit upright when eating or drinking. Eat slowly. Take small bites and chew food well before swallowing. Do not lie down after eating. Stay sitting up for 30 minutes or longer after each meal. Sip fluids between meals. Limit how much you drink at one time. With meals and snacks, have 4-8 oz (120-240 mL). This is equal to  cup-1 cup. Do not mix solid foods and liquids in the same mouthful. Drink enough fluid to keep your urine pale yellow. Do not chew gum or drink fluids through a straw. Doing those things may cause you to swallow extra air. General information Do not drink carbonated drinks or alcohol. Avoid foods and drinks that contain caffeine and chocolate. Avoid foods and drinks that contain citrus or tomato. Allow hot soups and drinks to cool before eating. Avoid foods that cause gas, such as beans, peas, broccoli, or cabbage. If dairy milk products cause diarrhea, avoid them or eat them in small amounts. Recommended foods Fruits Any soft-cooked fruits after skins and seeds are removed. Fruit juice. Vegetables Any soft-cooked vegetables after skins and seeds are removed. Vegetable juice. Grains Cooked cereals. Dry cereals softened with liquid. Cooked pasta, rice, or other grains. Toasted bread. Bland crackers, such as soda or graham crackers. Meats and other protein foods Tender cuts of meat, poultry, or fish after bones, skin, and gristle are removed. Poached, boiled, or scrambled eggs. Canned fish. Tofu. Creamy nut butters. Dairy Milk. Yogurt. Cottage cheese. Mild cheeses. Beverages Nutritional supplement drinks. Decaffeinated tea or coffee. Sports drinks. Fats and oils Butter. Margarine. Mayonnaise. Vegetable  oil. Smooth salad dressing. Sweets and desserts Plain hard candy. Marshmallows. Pudding. Ice cream. Gelatin. Sherbet. Seasoning and other foods Salt. Light seasonings. Mustard. Vinegar. The items listed above may not be a complete list of  recommended foods and beverages. Contact a dietitian for more information. Foods to avoid Fruits Oranges. Grapefruit. Lemons. Limes. Citrus juices. Dried fruit. Crunchy, raw fruits. Vegetables Tomato sauce. Tomato juice. Broccoli. Cauliflower. Cabbage. Brussels sprouts. Crunchy, raw vegetables. Grains High-fiber or bran cereal. Cereal with nuts, dried fruit, or coconut. Sweet breads, rolls, coffee cake, or donuts. Chewy or crusty breads. Popcorn. Meats and other protein foods Beans, peas, and lentils. Tough or fatty meats. Fried meats, chicken, or fish. Fried eggs. Nuts and seeds. Crunchy nut butters. Dairy Chocolate milk. Yogurt with chunks of fruit, nuts, seeds, or coconut. Strong cheeses. Beverages Carbonated soft drinks. Alcohol. Cocoa. Hot drinks. Fats and oils Bacon fat. Lard. Sweets and desserts Chocolate. Candy with nuts, coconut, or seeds. Peppermint. Cookies. Cakes. Pie crust. Seasoning and other foods Heavy seasonings. Chili sauce. Ketchup. Barbecue sauce. Christina [name]. Horseradish. The items listed above may not be a complete list of foods and beverages to avoid. Contact a dietitian for more information. Summary Following this eating plan after a Nissen fundoplication is an important part of healing after surgery. After surgery, you will start with a clear liquid diet before you progress to full liquids and soft foods. You may need to eat soft foods for several weeks. Avoid eating foods that cause irritation, gas, nausea, diarrhea, or swelling or pressure in the abdomen (bloating), and avoid foods that are difficult to swallow. Talk with a dietitian about which dietary choices are best for you. This information is not intended to replace advice given to you by your health care provider. Make sure you discuss any questions you have with your health care provider. Document Revised: 08/09/2019 Document Reviewed: 08/09/2019 Elsevier Patient Education  [place]  surgery scheduler will call you within 24-48 hours to schedule your surgery. Please have the Box surgery sheet available when speaking with her.    Open Nissen Fundoplication  Open Nissen fundoplication is a surgery to relieve heartburn and other problems caused by fluid from your stomach (gastric fluids) flowing up into your esophagus. The esophagus is the part of the body that moves food from the mouth to the stomach. Normally, the muscle that sits between the stomach and the esophagus (lower esophageal sphincter, LES) keeps stomach fluids in the stomach. In some people, the LES does not work properly, and stomach fluids flow up into the esophagus (reflux). This can happen when part of the stomach bulges through the LES (hiatal hernia). The backward flow of stomach fluids can cause a type of severe and long-lasting heartburn that is called gastroesophageal reflux disease (GERD). You may need this surgery if other treatments for GERD have not helped. In this procedure, the upper part of your stomach is wrapped around the lower end of your esophagus and stitched together (sutured). This tightens the connection between your esophagus and stomach to prevent stomach acid reflux. During this procedure, a hiatal hernia may also be repaired. Tell your health care provider about: Any allergies you have. All medicines you are taking, including vitamins, herbs, eye drops, creams, and over-the-counter medicines. Any problems you or family members have had with anesthetic medicines. Any blood disorders you have. Any surgeries you have had. Any medical conditions you have. Whether you are pregnant or may be pregnant. What are the  risks? Generally, this is a safe procedure. However, problems may occur, including: Return of GERD symptoms. Difficulty swallowing (dysphagia). Inability to burp. This can cause uncomfortable bloating. Procedure failure. The closure could be too tight or too loose. Damage to a  lung. A hole in the esophagus or stomach (perforation). Damage to nearby structures including the nerves, blood vessels, liver, or spleen. Bleeding. Infection. Allergic reactions to medicines. What happens before the procedure? Staying hydrated Follow instructions from your health care provider about hydration, which may include: Up to two hours before the procedure - you may continue to drink clear liquids, such as water, clear fruit juice, black coffee and plain tea. Eating and drinking restrictions Follow instructions from your health care provider about eating and drinking, which may include: 8 hours before the procedure - stop eating heavy meals or foods, such as meat, fried foods, or fatty foods. 6 hours before the procedure - stop eating light meals or foods, such as toast or cereal. 6 hours before the procedure - stop drinking milk or drinks that contain milk. 2 hours before the procedure - stop drinking clear liquids. Medicines Ask your health care provider about: Changing or stopping your regular medicines. This is especially important if you are taking diabetes medicines or blood thinners. Taking medicines such as aspirin and ibuprofen. These medicines can thin your blood. Do not take these medicines unless your health care provider tells you to take them. Taking over-the-counter medicines, vitamins, herbs, and supplements. Tests Your health care provider will do tests to plan the procedure. This may include: An exam using a flexible scope passed down your esophagus into your stomach (endoscopy). Imaging studies. General instructions Plan to have a responsible adult take you home from the hospital or clinic. Ask your health care provider: How your surgery site will be marked. What steps will be taken to help prevent infection. These steps may include: Removing hair at the surgery site. Washing skin with a germ-killing soap. Taking antibiotic medicine. Do not use any  products that contain nicotine or tobacco for at least 4 weeks before the procedure. These products include cigarettes, chewing tobacco, and vaping devices, such as e-cigarettes. If you need help quitting, ask your health care provider. What happens during the procedure? An IV will be inserted into one of your veins. You will be given medicine in your IV to help you relax (sedative) just before the procedure and a medicine to make you fall asleep (general anesthetic). You may have a tube placed through your nose into your stomach to drain stomach acid during the procedure (nasogastric tube). Your surgeon will make an incision in your upper abdomen. Your surgeon will carefully separate the layers of muscle and other connecting tissue surrounding the area where your esophagus connects to the top of your stomach. If you have a hiatal hernia, the opening in your diaphragm may be tightened or closed with sutures. The upper part of your stomach will be wrapped around the lower part of your esophagus and then sutured around the esophagus. This will strengthen the lower esophageal sphincter and prevent reflux. The layers of muscle and tissue will be closed with sutures. The incision in your belly will be closed with sutures or staples. A bandage (dressing) will be placed over the incision. The procedure may vary among health care providers and hospitals. What happens after the procedure? Your blood pressure, heart rate, breathing rate, and blood oxygen level will be monitored until you leave the hospital or clinic. You  may need to stay in the hospital for several days. You will be given pain medicine as needed. Your nasogastric tube will be removed when you can start on a diet of clear liquids. You may have an imaging study to check the surgery area. Your health care provider will send you home with instructions for taking care of yourself at home after your procedure. You will be encouraged to get up  and walk around as soon as possible. Summary Open Nissen fundoplication is a surgery to treat heartburn or damage to your esophagus from GERD. In this procedure, the upper part of your stomach is wrapped around the lower end of your esophagus and stitched together (sutured). This tightens the connection between your esophagus and stomach, preventing stomach acid reflux. A hiatal hernia may also be repaired during this procedure. You may need to stay in the hospital for several days. This information is not intended to replace advice given to you by your health care provider. Make sure you discuss any questions you have with your health care provider. Document Revised: 08/09/2019 Document Reviewed: 08/09/2019 Elsevier Patient Education  [place].

## 2021-07-05 NOTE — Progress Notes (Signed)
Per Clinical pharmacist, Please reach out to Dr. Etta Quill office and ask to have the following relayed to Dr. [name]:   "I am the clinical pharmacist working at [facility]. I spoke with Mrs. Gent on 06/30/21 for Chronic Care Management. Can you please clarify her Eliquis dose? Do you think she would be a candidate for Eliquis 5 mg twice daily since her creatinine is less than 1.5 and her weight is greater than 60 kg? Was there another reason we are using the reduced dose in her?"  Please ask them to let you know Dr. Etta Quill response.  Per Cardiology office they will have the nurse to return my call once they have a response from Dr. [name].  Per Cardiology RN, they were unsure why her Eliquis was reduce but Dr. Etta Quill agreed to increase her Eliquis back to 5 mg twice daily.They will send a prescription over to her pharmacy and inform the patient.Notified Clinical pharmacist.  Anderson Malta Clinical Pharmacist Assistant [phone]

## 2021-07-06 ENCOUNTER — Telehealth: Payer: Self-pay | Admitting: Surgery

## 2021-07-06 NOTE — Telephone Encounter (Signed)
Patient and husband has decided to hold off on surgery for now.  So surgery for robotic paraesophageal hernia repair scheduled on 07/27/21 is cancelled for now at their request.  Patient is made a follow up appointment with Dr. Dahlia Byes 08/16/21 and they will discuss rescheduling for surgery then.

## 2021-07-07 NOTE — Progress Notes (Signed)
Established patient visit   Patient: Christina [name]   DOB: 04-25-1935   86 y.o. Female  MRN: [id number] Visit Date: 07/11/2021  Today's healthcare provider: [name], FNP  Re Introduced to nurse practitioner role and practice setting.  All questions answered.  Discussed provider/patient relationship and expectations.   I,Tiffany J Bragg,acting as a scribe for [name], FNP.,have documented all relevant documentation on the behalf of [name], FNP,as directed by  [name], FNP while in the presence of [name], FNP.   Chief Complaint  Patient presents with   Hypertension   Subjective    HPI  Hypertension, follow-up  BP Readings from Last 3 Encounters:  07/11/21 122/67  07/05/21 (!) 162/82  06/09/21 130/78   Wt Readings from Last 3 Encounters:  07/11/21 166 lb (75.3 kg)  07/05/21 165 lb 3.2 oz (74.9 kg)  06/08/21 164 lb 5.3 oz (74.5 kg)     She was last seen for hypertension 2 months ago.  BP at that visit was 154/99. Management since that visit includes continue medication.  She reports excellent compliance with treatment. She is not having side effects.  She is following a Regular diet. She is exercising. She does not smoke.  Use of agents associated with hypertension: none.   Outside blood pressures are around 135/70; copies of home logs made and prepared for scan into chart for review by specialty teams.  Symptoms: No chest pain No chest pressure  No palpitations No syncope  Yes dyspnea No orthopnea  Yes paroxysmal nocturnal dyspnea Yes lower extremity edema   Pertinent labs Lab Results  Component Value Date   CHOL 159 03/18/2020   HDL 59 03/18/2020   LDLCALC 75 03/18/2020   TRIG 143 03/18/2020   CHOLHDL 3.0 02/20/2019   Lab Results  Component Value Date   NA 143 04/11/2021   K 4.5 04/11/2021   CREATININE 1.04 (H) 04/11/2021   GFRNONAA 53 (L) 04/11/2021   GLUCOSE 152 (H) 04/11/2021   TSH 4.590 (H) 03/18/2020      The ASCVD Risk score (Arnett DK, et al., 2019) failed to calculate for the following reasons:   The 2019 ASCVD risk score is only valid for ages 64 to 78  ---------------------------------------------------------------------------------------------------   Medications: Outpatient Medications Prior to Visit  Medication Sig   acetaminophen (TYLENOL) 500 MG tablet Take 500 mg by mouth every 8 (eight) hours as needed for mild pain or moderate pain.   albuterol (VENTOLIN HFA) 108 (90 Base) MCG/ACT inhaler Inhale 2 puffs into the lungs every 6 (six) hours as needed for wheezing or shortness of breath.   apixaban (ELIQUIS) 2.5 MG TABS tablet Take by mouth 2 (two) times daily.   Ascorbic Acid (VITAMIN C) 1000 MG tablet Take 1,000 mg by mouth daily.   azelastine (ASTELIN) 0.1 % nasal spray Place 1 spray into both nostrils daily as needed.   budesonide-formoterol (SYMBICORT) 160-4.5 MCG/ACT inhaler INHALE 1 PUFF BY MOUTH TWICE A DAY   Calcium Carb-Cholecalciferol (CALCIUM 600+D3 PO) Take 1 tablet by mouth daily.   carboxymethylcellulose (REFRESH PLUS) 0.5 % SOLN 1 drop 3 (three) times daily as needed.   Coenzyme Q10 (COQ-10) 100 MG capsule Take 1 capsule (100 mg total) by mouth daily.   docusate sodium (COLACE) 100 MG capsule Take 200 mg by mouth at bedtime.   fluconazole (DIFLUCAN) 100 MG tablet Take 1 tablet (100 mg total) by mouth daily.   Metoprolol Tartrate 75  MG TABS Take 75 mg by mouth 2 (two) times daily.   Multiple Vitamin (MULTI-VITAMIN DAILY PO) Take 1 tablet by mouth daily.   pantoprazole (PROTONIX) 40 MG tablet TAKE 1 TABLET (40 MG TOTAL) BY MOUTH ONCE DAILY TAKE 30 MINUTES BEFORE BREAKFAST.   tobramycin (TOBREX) 0.3 % ophthalmic solution    [DISCONTINUED] rosuvastatin (CRESTOR) 20 MG tablet Take 1 tablet (20 mg total) by mouth daily.   hydrochlorothiazide (HYDRODIURIL) 12.5 MG tablet Take 12.5 mg by mouth daily.   No facility-administered medications prior to visit.    Review of  Systems     Objective    BP 122/67 Comment: home  Pulse 80   Temp 97.7 F (36.5 C) (Oral)   Resp 16   Ht '5\' 6"'$  (1.676 m)   Wt 166 lb (75.3 kg)   SpO2 98%   BMI 26.79 kg/m    Physical Exam Vitals and nursing note reviewed.  Constitutional:      General: She is not in acute distress.    Appearance: Normal appearance. She is overweight. She is not ill-appearing, toxic-appearing or diaphoretic.  HENT:     Head: Normocephalic and atraumatic.  Cardiovascular:     Rate and Rhythm: Normal rate and regular rhythm.     Pulses: Normal pulses.     Heart sounds: Normal heart sounds. No murmur heard.   No friction rub. No gallop.  Pulmonary:     Effort: Pulmonary effort is normal. No respiratory distress.     Breath sounds: Normal breath sounds. No stridor. No wheezing, rhonchi or rales.  Chest:     Chest wall: No tenderness.  Abdominal:     General: Bowel sounds are normal.     Palpations: Abdomen is soft.  Musculoskeletal:        General: No swelling, tenderness, deformity or signs of injury. Normal range of motion.     Right lower leg: No edema.     Left lower leg: No edema.  Skin:    General: Skin is warm and dry.     Capillary Refill: Capillary refill takes less than 2 seconds.     Coloration: Skin is not jaundiced or pale.     Findings: No bruising, erythema, lesion or rash.  Neurological:     General: No focal deficit present.     Mental Status: She is alert and oriented to person, place, and time. Mental status is at baseline.     Cranial Nerves: No cranial nerve deficit.     Sensory: No sensory deficit.     Motor: No weakness.     Coordination: Coordination normal.  Psychiatric:        Mood and Affect: Mood normal.        Behavior: Behavior normal.        Thought Content: Thought content normal.        Judgment: Judgment normal.     No results found for any visits on 07/11/21.  Assessment & Plan     Problem List Items Addressed This Visit        Cardiovascular and Mediastinum   Primary hypertension - Primary    Chronic, stable on home logs Denies CP Denies SOB/ DOE Denies low blood pressure/hypotension Denies vision changes No LE Edema noted on exam Continue medications HCTZ 12.5 mg daily; Metop 75 mg Denies side effects RTC for CPE in 3 months, as surgery schedule allows Seek emergent care if you develop chest pain or chest pressure  Relevant Medications   rosuvastatin (CRESTOR) 20 MG tablet     Respiratory   Hiatal hernia with gastroesophageal reflux disease without esophagitis    Chronic, stable/likely worsening Plan for surgery Refused pre-surgical labs today due to the fact that she has just eaten Followed by [facility], cardiology and [facility], general surgery          Other   Hyperlipidemia    Chronic, stable Continue Crestor at 20 mg qHS/evening dosing for carotid stennosis mgmt and HLD Is f/b vascular team I recommend diet low in saturated fat and regular exercise - 30 min at least 5 times per week Continues to walk, with walker for peace of mind, for exercise        Relevant Medications   rosuvastatin (CRESTOR) 20 MG tablet     Return in about 3 months (around 10/11/2021).      Vonna Kotyk, FNP, have reviewed all documentation for this visit. The documentation on 07/11/21 for the exam, diagnosis, procedures, and orders are all accurate and complete.    [name], [place] [phone] (phone) [phone] (fax)  [place]

## 2021-07-11 ENCOUNTER — Ambulatory Visit (INDEPENDENT_AMBULATORY_CARE_PROVIDER_SITE_OTHER): Payer: Medicare Other | Admitting: Family Medicine

## 2021-07-11 ENCOUNTER — Encounter: Payer: Self-pay | Admitting: Family Medicine

## 2021-07-11 VITALS — BP 122/67 | HR 80 | Temp 97.7°F | Resp 16 | Ht 66.0 in | Wt 166.0 lb

## 2021-07-11 DIAGNOSIS — I10 Essential (primary) hypertension: Secondary | ICD-10-CM | POA: Diagnosis not present

## 2021-07-11 DIAGNOSIS — K219 Gastro-esophageal reflux disease without esophagitis: Secondary | ICD-10-CM

## 2021-07-11 DIAGNOSIS — E7800 Pure hypercholesterolemia, unspecified: Secondary | ICD-10-CM

## 2021-07-11 DIAGNOSIS — K449 Diaphragmatic hernia without obstruction or gangrene: Secondary | ICD-10-CM | POA: Diagnosis not present

## 2021-07-11 MED ORDER — ROSUVASTATIN CALCIUM 20 MG PO TABS: 20.0000 mg | ORAL_TABLET | Freq: Every evening | ORAL | 3 refills | Status: DC

## 2021-07-11 NOTE — Assessment & Plan Note (Signed)
Chronic, stable on home logs Denies CP Denies SOB/ DOE Denies low blood pressure/hypotension Denies vision changes No LE Edema noted on exam Continue medications HCTZ 12.5 mg daily; Metop 75 mg Denies side effects RTC for CPE in 3 months, as surgery schedule allows Seek emergent care if you develop chest pain or chest pressure

## 2021-07-11 NOTE — Assessment & Plan Note (Signed)
Chronic, stable/likely worsening Plan for surgery Refused pre-surgical labs today due to the fact that she has just eaten Followed by [facility], cardiology and [facility], general surgery

## 2021-07-11 NOTE — Assessment & Plan Note (Signed)
Chronic, stable Continue Crestor at 20 mg qHS/evening dosing for carotid stennosis mgmt and HLD Is f/b vascular team I recommend diet low in saturated fat and regular exercise - 30 min at least 5 times per week Continues to walk, with walker for peace of mind, for exercise

## 2021-07-14 DIAGNOSIS — H353221 Exudative age-related macular degeneration, left eye, with active choroidal neovascularization: Secondary | ICD-10-CM | POA: Diagnosis not present

## 2021-07-14 DIAGNOSIS — H43813 Vitreous degeneration, bilateral: Secondary | ICD-10-CM | POA: Diagnosis not present

## 2021-07-14 DIAGNOSIS — H35432 Paving stone degeneration of retina, left eye: Secondary | ICD-10-CM | POA: Diagnosis not present

## 2021-07-14 DIAGNOSIS — H353213 Exudative age-related macular degeneration, right eye, with inactive scar: Secondary | ICD-10-CM | POA: Diagnosis not present

## 2021-07-17 ENCOUNTER — Ambulatory Visit: Payer: Medicare Other | Admitting: Nurse Practitioner

## 2021-07-17 ENCOUNTER — Encounter: Payer: Self-pay | Admitting: Nurse Practitioner

## 2021-07-17 VITALS — BP 140/70 | HR 74 | Temp 98.1°F | Resp 16 | Ht 66.0 in | Wt 169.4 lb

## 2021-07-17 DIAGNOSIS — G4733 Obstructive sleep apnea (adult) (pediatric): Secondary | ICD-10-CM

## 2021-07-17 DIAGNOSIS — R0602 Shortness of breath: Secondary | ICD-10-CM | POA: Diagnosis not present

## 2021-07-17 DIAGNOSIS — Z9989 Dependence on other enabling machines and devices: Secondary | ICD-10-CM | POA: Diagnosis not present

## 2021-07-17 DIAGNOSIS — J449 Chronic obstructive pulmonary disease, unspecified: Secondary | ICD-10-CM

## 2021-07-17 MED ORDER — STIOLTO RESPIMAT 2.5-2.5 MCG/ACT IN AERS: 2.0000 | INHALATION_SPRAY | Freq: Two times a day (BID) | RESPIRATORY_TRACT | 3 refills | Status: DC

## 2021-07-17 MED ORDER — ALBUTEROL SULFATE HFA 108 (90 BASE) MCG/ACT IN AERS: 2.0000 | INHALATION_SPRAY | Freq: Four times a day (QID) | RESPIRATORY_TRACT | 5 refills | Status: DC | PRN

## 2021-07-17 NOTE — Progress Notes (Signed)
[name], [name]  Internal MEDICINE  Office Visit Note  Patient Name: Christina [name]  [id number]  [id number]  Date of Service: 07/17/2021  Chief Complaint  Patient presents with   Follow-up   Gastroesophageal Reflux   Hypertension   Hyperlipidemia    HPI [name] presents for a follow-up visit for COPD and sleep apnea.  Her most recent pulmonary function test from October 2022 showed mild obstructive lung disease and mild restrictive lung disease which is consistent with the findings of her previous pulmonary function test from October 2021. [name] today showed approximate decreased in FEV1 and FVC by 2% when compared to her PFT results from October 2022. She is considering a possible surgical repair of an esophageal hernia in July but has not made her final decision. She would most likely be a moderate risk for surgery related to her respiratory status. She is also considering her age, possible recovery time after surgery and the impact on her quality of life.   CPAP download: 95 percentile pressure 12 95th percentile leak 62.2 apnea index 1.7 /hr apnea-hypopnea index  2.6 /hr total days used  >4 hr 88 days total days used <4 hr 1 days Total compliance 98 percent   She is doing great no problems or questions at this time  Pt was seen by [name]  RRT/RCP  from [name]   Current Medication: Outpatient Encounter Medications as of 07/17/2021  Medication Sig   acetaminophen (TYLENOL) 500 MG tablet Take 500 mg by mouth every 8 (eight) hours as needed for mild pain or moderate pain.   apixaban (ELIQUIS) 2.5 MG TABS tablet Take by mouth 2 (two) times daily.   Ascorbic Acid (VITAMIN C) 1000 MG tablet Take 1,000 mg by mouth daily.   azelastine (ASTELIN) 0.1 % nasal spray Place 1 spray into both nostrils daily as needed.   Calcium Carb-Cholecalciferol (CALCIUM 600+D3 PO) Take 1 tablet by mouth daily.   carboxymethylcellulose (REFRESH PLUS)  0.5 % SOLN 1 drop 3 (three) times daily as needed.   Coenzyme Q10 (COQ-10) 100 MG capsule Take 1 capsule (100 mg total) by mouth daily.   docusate sodium (COLACE) 100 MG capsule Take 200 mg by mouth at bedtime.   Metoprolol Tartrate 75 MG TABS Take 75 mg by mouth 2 (two) times daily.   Multiple Vitamin (MULTI-VITAMIN DAILY PO) Take 1 tablet by mouth daily.   pantoprazole (PROTONIX) 40 MG tablet TAKE 1 TABLET (40 MG TOTAL) BY MOUTH ONCE DAILY TAKE 30 MINUTES BEFORE BREAKFAST.   rosuvastatin (CRESTOR) 20 MG tablet Take 1 tablet (20 mg total) by mouth every evening.   Tiotropium Bromide-Olodaterol (STIOLTO RESPIMAT) 2.5-2.5 MCG/ACT AERS Inhale 2 puffs into the lungs in the morning and at bedtime.   tobramycin (TOBREX) 0.3 % ophthalmic solution    [DISCONTINUED] albuterol (VENTOLIN HFA) 108 (90 Base) MCG/ACT inhaler Inhale 2 puffs into the lungs every 6 (six) hours as needed for wheezing or shortness of breath.   [DISCONTINUED] budesonide-formoterol (SYMBICORT) 160-4.5 MCG/ACT inhaler INHALE 1 PUFF BY MOUTH TWICE A DAY   [DISCONTINUED] fluconazole (DIFLUCAN) 100 MG tablet Take 1 tablet (100 mg total) by mouth daily.   albuterol (VENTOLIN HFA) 108 (90 Base) MCG/ACT inhaler Inhale 2 puffs into the lungs every 6 (six) hours as needed for wheezing or shortness of breath.   hydrochlorothiazide (HYDRODIURIL) 12.5 MG tablet Take 12.5 mg by mouth daily.   [DISCONTINUED] albuterol (VENTOLIN HFA) 108 (90 Base) MCG/ACT inhaler Inhale 2  puffs into the lungs every 6 (six) hours as needed for wheezing or shortness of breath.   [DISCONTINUED] amLODipine (NORVASC) 5 MG tablet Take 1 tablet (5 mg total) by mouth daily.   [DISCONTINUED] aspirin 81 MG tablet Take 81 mg by mouth daily.    [DISCONTINUED] budesonide-formoterol (SYMBICORT) 160-4.5 MCG/ACT inhaler TAKE 1 PUFF BY MOUTH TWICE A DAY   [DISCONTINUED] metoprolol tartrate (LOPRESSOR) 25 MG tablet Take 1 tablet (25 mg total) by mouth 2 (two) times daily.    [DISCONTINUED] Multiple Vitamins-Minerals (VITAMIN D3 COMPLETE PO) Take by mouth.   [DISCONTINUED] rosuvastatin (CRESTOR) 20 MG tablet Take 1 tablet (20 mg total) by mouth daily.   [DISCONTINUED] Turmeric (QC TUMERIC COMPLEX PO) Take by mouth. (Patient not taking: Reported on 03/23/2021)   No facility-administered encounter medications on file as of 07/17/2021.    Surgical History: Past Surgical History:  Procedure Laterality Date   ABDOMINAL HYSTERECTOMY  1980   due to dysfunctional uterine bleeding   APPENDECTOMY  1980   BREAST SURGERY Left 2000   biopsy   CARDIAC CATHETERIZATION     COLONOSCOPY     CORONARY ARTERY BYPASS GRAFT  2006   ESOPHAGOGASTRODUODENOSCOPY (EGD) WITH PROPOFOL N/A 06/08/2021   Procedure: ESOPHAGOGASTRODUODENOSCOPY (EGD) WITH PROPOFOL;  Surgeon: Christina Bellows, MD;  [name];  Service: Gastroenterology;  Laterality: N/A;   HEMORRHOID SURGERY     LOWER EXTREMITY ANGIOGRAPHY Right 02/25/2017   Procedure: LOWER EXTREMITY ANGIOGRAPHY;  Surgeon: Christina Huxley, MD;  Location: [facility];  Service: Cardiovascular;  Laterality: Right;   LOWER EXTREMITY INTERVENTION  02/25/2017   Procedure: LOWER EXTREMITY INTERVENTION;  Surgeon: Christina Huxley, MD;  Location: [facility];  Service: Cardiovascular;;   LUNG BIOPSY  1999   Negative   TONSILLECTOMY      Medical History: Past Medical History:  Diagnosis Date   Arthritis    COPD (chronic obstructive pulmonary disease) ([place])    Coronary artery disease    GERD (gastroesophageal reflux disease)    Hyperlipidemia    Hypertension    Macular degeneration    PAD (peripheral artery disease) ([place])    Sleep apnea     Family History: Family History  Problem Relation Age of Onset   Hypertension Mother    Hyperlipidemia Mother    Alzheimer's disease Mother    CAD Mother    Heart attack Father    Lung disease Sister    Heart disease Sister    Diabetes Paternal Grandfather        Type 2    Hearing loss Son     Social History   Socioeconomic History   Marital status: Married    Spouse name: Christina [name]   Number of children: 2   Years of education: 12   Highest education level: 12th grade  Occupational History   Occupation: retired  Tobacco Use   Smoking status: Former    Packs/day: 0.50    Years: 5.00    Total pack years: 2.50    Types: Cigarettes    Quit date: 04/24/2004    Years since quitting: 17.2   Smokeless tobacco: Never  Vaping Use   Vaping Use: Never used  Substance and Sexual Activity   Alcohol use: No   Drug use: No   Sexual activity: Yes  Other Topics Concern   Not on file  Social History Narrative   Not on file   Social Determinants of Health   Financial Resource Strain: Low  Risk  (04/10/2021)   Overall Financial Resource Strain (CARDIA)    Difficulty of Paying Living Expenses: Not hard at all  Food Insecurity: No Food Insecurity (02/23/2020)   Hunger Vital Sign    Worried About Running Out of Food in the Last Year: Never true    Ran Out of Food in the Last Year: Never true  Transportation Needs: No Transportation Needs (02/23/2020)   PRAPARE - Hydrologist (Medical): No    Lack of Transportation (Non-Medical): No  Physical Activity: Inactive (02/23/2020)   Exercise Vital Sign    Days of Exercise per Week: 0 days    Minutes of Exercise per Session: 0 min  Stress: No Stress Concern Present (02/23/2020)   [place]    Feeling of Stress : Not at all  Social Connections: Moderately Integrated (02/23/2020)   Social Connection and Isolation Panel [NHANES]    Frequency of Communication with Friends and Family: Three times a week    Frequency of Social Gatherings with Friends and Family: More than three times a week    Attends Religious Services: More than 4 times per year    Active Member of [employer] or Organizations: No    Attends Theatre manager Meetings: Never    Marital Status: Married  Human resources officer Violence: Not At Risk (02/23/2020)   Humiliation, Afraid, Rape, and Kick questionnaire    Fear of Current or Ex-Partner: No    Emotionally Abused: No    Physically Abused: No    Sexually Abused: No      Review of Systems  Constitutional:  Positive for fatigue. Negative for chills and unexpected weight change.  HENT:  Negative for congestion, rhinorrhea, sneezing and sore throat.   Eyes:  Negative for redness.  Respiratory:  Positive for choking (related to an esophageal hernia, not a life threatening issue at this time.). Negative for cough, chest tightness, shortness of breath and wheezing.   Cardiovascular: Negative.  Negative for chest pain and palpitations.  Gastrointestinal:  Negative for abdominal pain, constipation, diarrhea, nausea and vomiting.  Psychiatric/Behavioral:  Positive for sleep disturbance. Negative for behavioral problems (Depression) and suicidal ideas. The patient is not nervous/anxious.     Vital Signs: BP 140/70   Pulse 74   Temp 98.1 F (36.7 C)   Resp 16   Ht '5\' 6"'$  (1.676 m)   Wt 169 lb 6.4 oz (76.8 kg)   SpO2 98%   BMI 27.34 kg/m    Physical Exam Vitals reviewed.  Constitutional:      General: She is not in acute distress.    Appearance: Normal appearance. She is not ill-appearing.  HENT:     Head: Normocephalic and atraumatic.  Eyes:     Pupils: Pupils are equal, round, and reactive to light.  Cardiovascular:     Rate and Rhythm: Normal rate and regular rhythm.     Heart sounds: Normal heart sounds. No murmur heard. Pulmonary:     Effort: Pulmonary effort is normal. No respiratory distress.     Breath sounds: Normal breath sounds. No wheezing.  Neurological:     Mental Status: She is alert and oriented to person, place, and time.  Psychiatric:        Mood and Affect: Mood normal.        Behavior: Behavior normal.        Assessment/Plan: 1. Chronic  obstructive pulmonary disease, unspecified COPD type ([place]) Was  on symbicort but cannot afford it esp while in the donut hole. Was given dulera at discharge from [name] recently but this medication is a tier 4 on her insurance.  No refills needed at this time, spiro approximately 2% decrease in pulmonary function since PFT in October 2022. Mild restrictive and mild obstructive lung disease. samples provided of stiolto respimat. Prescription sent to pharmacy.    2. OSA on CPAP Doing well on CPAP, 98% compliance. No questions or concerns per patient. She does not need any supplies.follow up in 6 months.   3. Shortness of breath [name] done today, slight decrease in pulmonary function when compared to PFT in October last year.  - Spirometry with Graph  General Counseling: leanndra pember understanding of the findings of todays visit and agrees with plan of treatment. I have discussed any further diagnostic evaluation that may be needed or ordered today. We also reviewed her medications today. she has been encouraged to call the office with any questions or concerns that should arise related to todays visit.    Orders Placed This Encounter  Procedures   Spirometry with Graph    Meds ordered this encounter  Medications   albuterol (VENTOLIN HFA) 108 (90 Base) MCG/ACT inhaler    Sig: Inhale 2 puffs into the lungs every 6 (six) hours as needed for wheezing or shortness of breath.    Dispense:  18 g    Refill:  5   Tiotropium Bromide-Olodaterol (STIOLTO RESPIMAT) 2.5-2.5 MCG/ACT AERS    Sig: Inhale 2 puffs into the lungs in the morning and at bedtime.    Dispense:  1 each    Refill:  3    Return in about 6 months (around 01/16/2022) for F/U, pulmonary/sleep, Kurt Azimi .   Total time spent:30 Minutes Time spent includes review of chart, medications, test results, and follow up plan with the patient.   [place] Controlled Substance Database was reviewed by me.  This patient was seen by [name], FNP-C in collaboration with Dr. [name] as a part of collaborative care agreement.   Beulah Matusek R. Valetta Fuller, MSN, FNP-C Internal medicine

## 2021-07-19 ENCOUNTER — Other Ambulatory Visit: Payer: Medicare Other

## 2021-07-27 ENCOUNTER — Ambulatory Visit: Admit: 2021-07-27 | Payer: Medicare Other | Admitting: Surgery

## 2021-07-27 SURGERY — REPAIR, HERNIA, PARAESOPHAGEAL, ROBOT-ASSISTED
Anesthesia: General

## 2021-08-03 ENCOUNTER — Ambulatory Visit (INDEPENDENT_AMBULATORY_CARE_PROVIDER_SITE_OTHER): Payer: Medicare Other

## 2021-08-03 ENCOUNTER — Ambulatory Visit (INDEPENDENT_AMBULATORY_CARE_PROVIDER_SITE_OTHER): Payer: Medicare Other | Admitting: Vascular Surgery

## 2021-08-03 ENCOUNTER — Other Ambulatory Visit (INDEPENDENT_AMBULATORY_CARE_PROVIDER_SITE_OTHER): Payer: Self-pay | Admitting: Vascular Surgery

## 2021-08-03 DIAGNOSIS — I6523 Occlusion and stenosis of bilateral carotid arteries: Secondary | ICD-10-CM

## 2021-08-09 ENCOUNTER — Telehealth: Payer: Self-pay

## 2021-08-09 NOTE — Progress Notes (Signed)
Chronic Care Management Pharmacy Assistant   Name: Christina [name]  MRN: [id number] DOB: 04-Jan-1936  Reason for Encounter: COPD Disease State Call.   Recent office visits:  07/11/2021 [name] FNP (PCP) No Medication Changes noted, follow up in 3 months  Recent consult visits:  07/17/2021 [name] NP (Pulmonology) Start Stiolto Respimat 2.5-2.5 MCG/ACT inhale 2 puffs in the morning and bedtime (Samples given), Stop Symbicort due to cost, Return in 6 months  07/05/2021 Dr. Dahlia Byes MD (General Surgery) Stop eliquis 3 days before surgery  Hospital visits:  None in previous 6 months  Medications: Outpatient Encounter Medications as of 08/09/2021  Medication Sig   acetaminophen (TYLENOL) 500 MG tablet Take 500 mg by mouth every 8 (eight) hours as needed for mild pain or moderate pain.   albuterol (VENTOLIN HFA) 108 (90 Base) MCG/ACT inhaler Inhale 2 puffs into the lungs every 6 (six) hours as needed for wheezing or shortness of breath.   apixaban (ELIQUIS) 2.5 MG TABS tablet Take by mouth 2 (two) times daily.   Ascorbic Acid (VITAMIN C) 1000 MG tablet Take 1,000 mg by mouth daily.   azelastine (ASTELIN) 0.1 % nasal spray Place 1 spray into both nostrils daily as needed.   Calcium Carb-Cholecalciferol (CALCIUM 600+D3 PO) Take 1 tablet by mouth daily.   carboxymethylcellulose (REFRESH PLUS) 0.5 % SOLN 1 drop 3 (three) times daily as needed.   Coenzyme Q10 (COQ-10) 100 MG capsule Take 1 capsule (100 mg total) by mouth daily.   docusate sodium (COLACE) 100 MG capsule Take 200 mg by mouth at bedtime.   hydrochlorothiazide (HYDRODIURIL) 12.5 MG tablet Take 12.5 mg by mouth daily.   Metoprolol Tartrate 75 MG TABS Take 75 mg by mouth 2 (two) times daily.   Multiple Vitamin (MULTI-VITAMIN DAILY PO) Take 1 tablet by mouth daily.   pantoprazole (PROTONIX) 40 MG tablet TAKE 1 TABLET (40 MG TOTAL) BY MOUTH ONCE DAILY TAKE 30 MINUTES BEFORE BREAKFAST.   rosuvastatin (CRESTOR) 20 MG  tablet Take 1 tablet (20 mg total) by mouth every evening.   Tiotropium Bromide-Olodaterol (STIOLTO RESPIMAT) 2.5-2.5 MCG/ACT AERS Inhale 2 puffs into the lungs in the morning and at bedtime.   tobramycin (TOBREX) 0.3 % ophthalmic solution    No facility-administered encounter medications on file as of 08/09/2021.    Care Gaps: Shingrix Vaccine Dexa Scan COVID-19 Vaccine   Star Rating Drugs: Rosuvastatin 20 mg last filled 07/11/2021 [facility] day supply at [facility].   Medication Fill Gaps: None ID  Current COPD regimen:  Ventolin HFA 2 puffs every 6 hours as needed Stiolto Respimat 2.5-2.5 MCG/ACT inhale 2 puffs in the morning and at bedtime.      No data to display         Any recent hospitalizations or ED visits since last visit with CPP? No Denies COPD symptoms, including Increased shortness of breath , Rescue medicine is not helping, Shortness of breath at rest, Symptoms worse with exercise, Symptoms worse at night, and Wheezing  What recent interventions/DTPs have been made by any provider to improve breathing since last visit: 07/17/2021 [name] NP (Pulmonology) Start Stiolto Respimat 2.5-2.5 MCG/ACT inhale 2 puffs in the morning and bedtime, Stop Symbicort due to cost  Have you had exacerbation/flare-up since last visit? No  What do you do when you are short of breath?  Rescue medication and Rest  Respiratory Devices/Equipment Do you have a nebulizer? No Do you use a Peak Flow Meter? No Do you use a  maintenance inhaler? Yes How often do you forget to use your daily inhaler?  Patient states she was given samples of Stiolto Respimat, and she uses only uses this inhaler once a day in the evenings.  Do you use a rescue inhaler? Yes How often do you use your rescue inhaler?  daily,Patient reports she use her HFA Inhaler daily espically when the weather is this hot. Do you use a spacer with your inhaler? No  Patient reports she stays inside most of the time with  the A/C on to avoid having exacerbation/Flare- up.Patient reports her breathing seems to be doing good with no issues at this time.   CAT ASSESSMENT  Rank each of the following items on a scale of 0 to 5 (with 5 being most severe) Write a # 0-5 in each box  I never cough (0) > I cough all the time (5) 2  I have no phlegm (mucus) in my chest (0) > My chest is completely full of phlegm (mucus) (5) 1  My chest does not feel tight at all (0) > My chest feels very tight (5) 0  When I walk up a hill or one flight of stairs I am not breathless (0) > When I walk up a hill or one flight of stairs I am very breathless (5) 4  I am not limited doing any activities at home (0) > I am very limited doing activities at home (5) 1  I am confident leaving my home despite my lung function (0) > I am not at all confident leaving my home because of my lung condition (5)  0  I sleep soundly (0) > I don't sleep soundly because of my lung condition (5) 0  I have lots of energy (0) > I have no energy at all (5) 1   Total CAT Score: 9  Adherence Review: Does the patient have >5 day gap between last estimated fill date for maintenance inhaler medications? No   Patient states she has a appointment next week with a surgeon to discuss her hernia surgery, and she is going to ask if they can check her blood type again.Patient states she has a rare blood type, and would like to have her blood check just to be on the safe side incase anything happens during her sugery.   Patient reports since her PCP increase her metoprolol to 75 mg her blood pressure has been doing wonderful.Patient states she gave her blood pressure log to her PCP whom documented it in her chart.Patient is aware to continue checking her blood pressure at home and keeping a blood pressure log, so she can inform the Clinical pharmacist of her results at her follow up on 09/19/2021.  [place] Pharmacist Assistant [phone]

## 2021-08-14 ENCOUNTER — Ambulatory Visit: Payer: Medicare Other | Admitting: Surgery

## 2021-08-16 ENCOUNTER — Ambulatory Visit: Payer: Medicare Other | Admitting: Surgery

## 2021-08-16 ENCOUNTER — Encounter: Payer: Self-pay | Admitting: Surgery

## 2021-08-16 ENCOUNTER — Other Ambulatory Visit: Payer: Self-pay

## 2021-08-16 VITALS — BP 174/93 | HR 93 | Temp 97.9°F | Ht 65.0 in | Wt 168.6 lb

## 2021-08-16 DIAGNOSIS — K449 Diaphragmatic hernia without obstruction or gangrene: Secondary | ICD-10-CM

## 2021-08-16 NOTE — Progress Notes (Signed)
MRN : [id number]  Christina [name] is a 86 y.o. (Apr 20, 1935) female who presents with chief complaint of check carotid arteries.  History of Present Illness:   The patient is seen for evaluation of carotid stenosis. The carotid stenosis was identified remotely but has not been followed recently.   The patient denies amaurosis fugax. There is no recent history of TIA symptoms or focal motor deficits. There is no prior documented CVA.   There is no history of migraine headaches. There is no history of seizures.   The patient is taking enteric-coated aspirin 81 mg daily.   The patient has a history of coronary artery disease, no recent episodes of angina or shortness of breath. The patient denies PAD or claudication symptoms. There is a history of hyperlipidemia which is being treated with a statin.     Duplex ultrasound of the carotid arteries shows RICA 5-57% and LICA 32-20% No significant change compared to last study.  No outpatient medications have been marked as taking for the 08/17/21 encounter (Appointment) with [name], Dolores Lory, MD.    Past Medical History:  Diagnosis Date   Arthritis    COPD (chronic obstructive pulmonary disease) ([place])    Coronary artery disease    GERD (gastroesophageal reflux disease)    Hyperlipidemia    Hypertension    Macular degeneration    PAD (peripheral artery disease) ([place])    Sleep apnea     Past Surgical History:  Procedure Laterality Date   ABDOMINAL HYSTERECTOMY  1980   due to dysfunctional uterine bleeding   APPENDECTOMY  1980   BREAST SURGERY Left 2000   biopsy   CARDIAC CATHETERIZATION     COLONOSCOPY     CORONARY ARTERY BYPASS GRAFT  2006   ESOPHAGOGASTRODUODENOSCOPY (EGD) WITH PROPOFOL N/A 06/08/2021   Procedure: ESOPHAGOGASTRODUODENOSCOPY (EGD) WITH PROPOFOL;  Surgeon: [name], MD;  Location: [facility] ENDOSCOPY;  Service: Gastroenterology;  Laterality: N/A;   HEMORRHOID SURGERY     LOWER EXTREMITY ANGIOGRAPHY  Right 02/25/2017   Procedure: LOWER EXTREMITY ANGIOGRAPHY;  Surgeon: Algernon Huxley, MD;  Location: [place] [facility];  Service: Cardiovascular;  Laterality: Right;   LOWER EXTREMITY INTERVENTION  02/25/2017   Procedure: LOWER EXTREMITY INTERVENTION;  Surgeon: Algernon Huxley, MD;  Location: [place] [facility];  Service: Cardiovascular;;   LUNG BIOPSY  1999   Negative   TONSILLECTOMY      Social History Social History   Tobacco Use   Smoking status: Former    Packs/day: 0.50    Years: 5.00    Total pack years: 2.50    Types: Cigarettes    Quit date: 04/24/2004    Years since quitting: 17.3   Smokeless tobacco: Never  Vaping Use   Vaping Use: Never used  Substance Use Topics   Alcohol use: No   Drug use: No    Family History Family History  Problem Relation Age of Onset   Hypertension Mother    Hyperlipidemia Mother    Alzheimer's disease Mother    CAD Mother    Heart attack Father    Lung disease Sister    Heart disease Sister    Diabetes Paternal Grandfather        Type 2   Hearing loss Son     Allergies  Allergen Reactions   Baclofen Other (See Comments)   Codeine Nausea Only   Plavix [Clopidogrel]     brusing all over   Sulfa Antibiotics Nausea Only  Latex Rash     REVIEW OF SYSTEMS (Negative unless checked)  Constitutional: '[]'$ Weight loss  '[]'$ Fever  '[]'$ Chills Cardiac: '[]'$ Chest pain   '[]'$ Chest pressure   '[]'$ Palpitations   '[]'$ Shortness of breath when laying flat   '[]'$ Shortness of breath with exertion. Vascular:  '[x]'$ Pain in legs with walking   '[]'$ Pain in legs at rest  '[]'$ History of DVT   '[]'$ Phlebitis   '[]'$ Swelling in legs   '[]'$ Varicose veins   '[]'$ Non-healing ulcers Pulmonary:   '[]'$ Uses home oxygen   '[]'$ Productive cough   '[]'$ Hemoptysis   '[]'$ Wheeze  '[]'$ COPD   '[]'$ Asthma Neurologic:  '[]'$ Dizziness   '[]'$ Seizures   '[]'$ History of stroke   '[]'$ History of TIA  '[]'$ Aphasia   '[]'$ Vissual changes   '[]'$ Weakness or numbness in arm   '[]'$ Weakness or numbness in leg Musculoskeletal:   '[]'$ Joint swelling    '[]'$ Joint pain   '[]'$ Low back pain Hematologic:  '[]'$ Easy bruising  '[]'$ Easy bleeding   '[]'$ Hypercoagulable state   '[]'$ Anemic Gastrointestinal:  '[]'$ Diarrhea   '[]'$ Vomiting  '[]'$ Gastroesophageal reflux/heartburn   '[]'$ Difficulty swallowing. Genitourinary:  '[]'$ Chronic kidney disease   '[]'$ Difficult urination  '[]'$ Frequent urination   '[]'$ Blood in urine Skin:  '[]'$ Rashes   '[]'$ Ulcers  Psychological:  '[]'$ History of anxiety   '[]'$  History of major depression.  Physical Examination  There were no vitals filed for this visit. There is no height or weight on file to calculate BMI. Gen: WD/WN, NAD Head: [place]/AT, No temporalis wasting.  Ear/Nose/Throat: Hearing grossly intact, nares w/o erythema or drainage Eyes: PER, EOMI, sclera nonicteric.  Neck: Supple, no masses.  No bruit or JVD.  Pulmonary:  Good air movement, no audible wheezing, no use of accessory muscles.  Cardiac: RRR, normal S1, S2, no Murmurs. Vascular:  left carotid bruit noted Vessel Right Left  Radial Palpable Palpable  Carotid  Palpable  Palpable  Subclav  Palpable Palpable  Gastrointestinal: soft, non-distended. No guarding/no peritoneal signs.  Musculoskeletal: M/S 5/5 throughout.  No visible deformity.  Neurologic: CN 2-12 intact. Pain and light touch intact in extremities.  Symmetrical.  Speech is fluent. Motor exam as listed above. Psychiatric: Judgment intact, Mood & affect appropriate for pt's clinical situation. Dermatologic: No rashes or ulcers noted.  No changes consistent with cellulitis.   CBC Lab Results  Component Value Date   WBC 7.6 04/11/2021   HGB 11.7 (L) 04/11/2021   HCT 36.6 04/11/2021   MCV 103.1 (H) 04/11/2021   PLT 195 04/11/2021    BMET    Component Value Date/Time   NA 143 04/11/2021 0602   NA 142 03/18/2020 1059   K 4.5 04/11/2021 0602   CL 107 04/11/2021 0602   CO2 25 04/11/2021 0602   GLUCOSE 152 (H) 04/11/2021 0602   BUN 29 (H) 04/11/2021 0602   BUN 23 03/18/2020 1059   CREATININE 1.04 (H) 04/11/2021 0602    CALCIUM 9.0 04/11/2021 0602   GFRNONAA 53 (L) 04/11/2021 0602   GFRAA 47 (L) 03/18/2020 1059   CrCl cannot be calculated (Patient's most recent lab result is older than the maximum 21 days allowed.).  COAG No results found for: "INR", "PROTIME"  Radiology VAS US CAROTID  Result Date: 08/14/2021 Carotid Arterial Duplex Study Patient Name:  Christina [name]  Date of Exam:   08/03/2021 Medical Rec #: [id number]            Accession #:    [id number] Date of Birth: 1935/10/24            Patient Gender: F Patient Age:   29  years Exam Location:  [facility] Procedure:      VAS US CAROTID Referring Phys: Hortencia Pilar --------------------------------------------------------------------------------  Indications: Carotid artery disease. Performing Technologist: Concha Norway RVT  Examination Guidelines: A complete evaluation includes B-mode imaging, spectral Doppler, color Doppler, and power Doppler as needed of all accessible portions of each vessel. Bilateral testing is considered an integral part of a complete examination. Limited examinations for reoccurring indications may be performed as noted.  Right Carotid Findings: +----------+--------+--------+--------+------------------+--------+           PSV cm/sEDV cm/sStenosisPlaque DescriptionComments +----------+--------+--------+--------+------------------+--------+ CCA Prox  69      12                                         +----------+--------+--------+--------+------------------+--------+ CCA Mid   76      14                                         +----------+--------+--------+--------+------------------+--------+ CCA Distal63      9                                          +----------+--------+--------+--------+------------------+--------+ ICA Prox  56      11      1-39%   heterogenous               +----------+--------+--------+--------+------------------+--------+ ICA Mid   55      8                                           +----------+--------+--------+--------+------------------+--------+ ICA Distal54      8                                          +----------+--------+--------+--------+------------------+--------+ ECA       61      7                                          +----------+--------+--------+--------+------------------+--------+ +----------+--------+-------+----------------+-------------------+           PSV cm/sEDV cmsDescribe        Arm Pressure (mmHG) +----------+--------+-------+----------------+-------------------+ [id number]             Multiphasic, WNL                    +----------+--------+-------+----------------+-------------------+ +---------+--------+--+--------+--+---------+ VertebralPSV cm/s48EDV cm/s11Antegrade +---------+--------+--+--------+--+---------+  Left Carotid Findings: +----------+--------+--------+--------+-------------------+--------+           PSV cm/sEDV cm/sStenosisPlaque Description Comments +----------+--------+--------+--------+-------------------+--------+ CCA Prox  58      8                                           +----------+--------+--------+--------+-------------------+--------+ CCA Mid   52      9                                           +----------+--------+--------+--------+-------------------+--------+  CCA Distal44      10                                          +----------+--------+--------+--------+-------------------+--------+ ICA Prox  129     32      40-59%  calcific and smooth         +----------+--------+--------+--------+-------------------+--------+ ICA Mid   104     19                                          +----------+--------+--------+--------+-------------------+--------+ ICA Distal60      14                                          +----------+--------+--------+--------+-------------------+--------+ ECA       103     8                                            +----------+--------+--------+--------+-------------------+--------+ +----------+--------+--------+----------------+-------------------+           PSV cm/sEDV cm/sDescribe        Arm Pressure (mmHG) +----------+--------+--------+----------------+-------------------+ [id number]              Multiphasic, WNL                    +----------+--------+--------+----------------+-------------------+ +---------+--------+--+--------+-+---------+ VertebralPSV cm/s24EDV cm/s6Antegrade +---------+--------+--+--------+-+---------+   Summary: Right Carotid: The extracranial vessels were near-normal with only minimal wall                thickening or plaque. Left Carotid: Velocities in the left ICA are consistent with a 40-59% stenosis.               Tortuous proximal ICA with mild calcified plaque. Vertebrals:  Bilateral vertebral arteries demonstrate antegrade flow. Subclavians: Normal flow hemodynamics were seen in bilateral subclavian              arteries. *See table(s) above for measurements and observations.  Electronically signed by Hortencia Pilar MD on 08/14/2021 at 5:19:24 PM.    Final      Assessment/Plan 1. Atherosclerosis of both carotid arteries Recommend:  Given the patient's asymptomatic subcritical stenosis no further invasive testing or surgery at this time.  Duplex ultrasound shows 1-39% RICA and 86-76% LICA stenosis.  No change compared to previous study.  Continue antiplatelet therapy as prescribed Continue management of CAD, HTN and Hyperlipidemia Healthy heart diet,  encouraged exercise at least 4 times per week Follow up in 12 months with duplex ultrasound and physical exam    The patient is cleared for up coming surgery from a vascular standpoint.   - VAS US CAROTID; Future  2. PAD (peripheral artery disease) (HCC)  Recommend:  The patient has evidence of atherosclerosis of the lower extremities with claudication.  The patient does not voice  lifestyle limiting changes at this point in time.  Noninvasive studies do not suggest clinically significant change.  No invasive studies, angiography or surgery at this time The patient should continue walking and begin a more formal exercise program.  The patient should continue  antiplatelet therapy and aggressive treatment of the lipid abnormalities  No changes in the patient's medications at this time  Continued surveillance is indicated as atherosclerosis is likely to progress with time.    The patient will continue follow up with noninvasive studies as ordered.    3. Coronary artery disease involving native coronary artery of native heart without angina pectoris Continue cardiac and antihypertensive medications as already ordered and reviewed, no changes at this time.  Continue statin as ordered and reviewed, no changes at this time  Nitrates PRN for chest pain   4. Primary hypertension Continue antihypertensive medications as already ordered, these medications have been reviewed and there are no changes at this time.   5. Paroxysmal atrial fibrillation (HCC) Continue antiarrhythmia medications as already ordered, these medications have been reviewed and there are no changes at this time.  Continue anticoagulation as ordered by Cardiology Service     Hortencia Pilar, MD  08/16/2021 4:56 PM

## 2021-08-16 NOTE — Patient Instructions (Addendum)
Our surgery scheduler will call you within 24-48 hours to schedule your surgery. Please have the [facility] surgery sheet available when speaking with her.   Hold the Eliquis three days prior to your surgery.   Eating Plan After Nissen Fundoplication After a Nissen fundoplication procedure, it is common to have some difficulty swallowing. The part of your body that moves food and liquid from your mouth to your stomach (esophagus) will be swollen and may feel tight. It will take several weeks or months for your esophagus and stomach to heal. By following a special eating plan, you can prevent problems such as pain, swelling or pressure in the abdomen (bloating), gas, nausea, or diarrhea. What are tips for following this plan? Cooking Cook all foods until they are soft. Remove skins and seeds from fruits and vegetables before eating. Remove skin and gristle from meats. Grind or finely mince meats before eating. Avoid over-cooking meat. Dry, tough meat is more difficult to swallow. Avoid using oil when cooking, or use only a small amount of oil. Avoid using seasoning when cooking, or use only a small amount of seasoning. Toast bread before eating. This makes it easier to swallow. Meal planning  Eat 6-8 small meals throughout the day. Right after the surgery, have a few meals that are only clear liquids. Clear liquids include: Water. Clear fruit juice, no pulp. Chicken, beef, or vegetable broth. Gelatin. Decaffeinated tea or coffee without milk. Popsicles or shaved ice. Depending on your progress, you may move to a full liquid diet as told by your health care provider. This includes clear liquids and the following: Dairy and alternative milks, such as soy milk. Strained creamed soups. Ice cream or sherbet. Pudding. Nutritional supplement drinks. Yogurt. A few days after surgery, you may be able to start eating a diet of soft foods. You may need to eat according to this plan for several  weeks. Do not eat sweets or sweetened drinks at the beginning of a meal. Doing that may cause your stomach to empty faster than it should (dumping syndrome). Lifestyle Always sit upright when eating or drinking. Eat slowly. Take small bites and chew food well before swallowing. Do not lie down after eating. Stay sitting up for 30 minutes or longer after each meal. Sip fluids between meals. Limit how much you drink at one time. With meals and snacks, have 4-8 oz (120-240 mL). This is equal to  cup-1 cup. Do not mix solid foods and liquids in the same mouthful. Drink enough fluid to keep your urine pale yellow. Do not chew gum or drink fluids through a straw. Doing those things may cause you to swallow extra air. General information Do not drink carbonated drinks or alcohol. Avoid foods and drinks that contain caffeine and chocolate. Avoid foods and drinks that contain citrus or tomato. Allow hot soups and drinks to cool before eating. Avoid foods that cause gas, such as beans, peas, broccoli, or cabbage. If dairy milk products cause diarrhea, avoid them or eat them in small amounts. Recommended foods Fruits Any soft-cooked fruits after skins and seeds are removed. Fruit juice. Vegetables Any soft-cooked vegetables after skins and seeds are removed. Vegetable juice. Grains Cooked cereals. Dry cereals softened with liquid. Cooked pasta, rice, or other grains. Toasted bread. Bland crackers, such as soda or graham crackers. Meats and other protein foods Tender cuts of meat, poultry, or fish after bones, skin, and gristle are removed. Poached, boiled, or scrambled eggs. Canned fish. Tofu. Creamy nut butters. Dairy  Milk. Yogurt. Cottage cheese. Mild cheeses. Beverages Nutritional supplement drinks. Decaffeinated tea or coffee. Sports drinks. Fats and oils Butter. Margarine. Mayonnaise. Vegetable oil. Smooth salad dressing. Sweets and desserts Plain hard candy. Marshmallows. Pudding. Ice  cream. Gelatin. Sherbet. Seasoning and other foods Salt. Light seasonings. Mustard. Vinegar. The items listed above may not be a complete list of recommended foods and beverages. Contact a dietitian for more information. Foods to avoid Fruits Oranges. Grapefruit. Lemons. Limes. Citrus juices. Dried fruit. Crunchy, raw fruits. Vegetables Tomato sauce. Tomato juice. Broccoli. Cauliflower. Cabbage. Brussels sprouts. Crunchy, raw vegetables. Grains High-fiber or bran cereal. Cereal with nuts, dried fruit, or coconut. Sweet breads, rolls, coffee cake, or donuts. Chewy or crusty breads. Popcorn. Meats and other protein foods Beans, peas, and lentils. Tough or fatty meats. Fried meats, chicken, or fish. Fried eggs. Nuts and seeds. Crunchy nut butters. Dairy Chocolate milk. Yogurt with chunks of fruit, nuts, seeds, or coconut. Strong cheeses. Beverages Carbonated soft drinks. Alcohol. Cocoa. Hot drinks. Fats and oils Bacon fat. Lard. Sweets and desserts Chocolate. Candy with nuts, coconut, or seeds. Peppermint. Cookies. Cakes. Pie crust. Seasoning and other foods Heavy seasonings. Chili sauce. Ketchup. Barbecue sauce. Angie Fava. Horseradish. The items listed above may not be a complete list of foods and beverages to avoid. Contact a dietitian for more information. Summary Following this eating plan after a Nissen fundoplication is an important part of healing after surgery. After surgery, you will start with a clear liquid diet before you progress to full liquids and soft foods. You may need to eat soft foods for several weeks. Avoid eating foods that cause irritation, gas, nausea, diarrhea, or swelling or pressure in the abdomen (bloating), and avoid foods that are difficult to swallow. Talk with a dietitian about which dietary choices are best for you. This information is not intended to replace advice given to you by your health care provider. Make sure you discuss any questions you have with  your health care provider. Document Revised: 08/09/2019 Document Reviewed: 08/09/2019 Elsevier Patient Education  [place].     Hiatal Hernia  A hiatal hernia occurs when part of the stomach slides above the muscle that separates the abdomen from the chest (diaphragm). A person can be born with a hiatal hernia (congenital), or it may develop over time. In almost all cases of hiatal hernia, only the top part of the stomach pushes through the diaphragm. Many people have a hiatal hernia with no symptoms. The larger the hernia, the more likely it is that you will have symptoms. In some cases, a hiatal hernia allows stomach acid to flow back into the tube that carries food from your mouth to your stomach (esophagus). This may cause heartburn symptoms. Severe heartburn symptoms may mean that you have developed a condition called gastroesophageal reflux disease (GERD). What are the causes? This condition is caused by a weakness in the opening (hiatus) where the esophagus passes through the diaphragm to attach to the upper part of the stomach. A person may be born with a weakness in the hiatus, or a weakness can develop over time. What increases the risk? This condition is more likely to develop in: Older people. Age is a major risk factor for a hiatal hernia, especially if you are over the age of 82. Pregnant women. People who are overweight. People who have frequent constipation. What are the signs or symptoms? Symptoms of this condition usually develop in the form of GERD symptoms. Symptoms include: Heartburn. Belching. Indigestion. Trouble  swallowing. Coughing or wheezing. Sore throat. Hoarseness. Chest pain. Nausea and vomiting. How is this diagnosed? This condition may be diagnosed during testing for GERD. Tests that may be done include: X-rays of your stomach or chest. An upper gastrointestinal (GI) series. This is an X-ray exam of your GI tract that is taken after you  swallow a chalky liquid that shows up clearly on the X-ray. Endoscopy. This is a procedure to look into your stomach using a thin, flexible tube that has a tiny camera and light on the end of it. How is this treated? This condition may be treated by: Dietary and lifestyle changes to help reduce GERD symptoms. Medicines. These may include: Over-the-counter antacids. Medicines that make your stomach empty more quickly. Medicines that block the production of stomach acid (H2 blockers). Stronger medicines to reduce stomach acid (proton pump inhibitors). Surgery to repair the hernia, if other treatments are not helping. If you have no symptoms, you may not need treatment. Follow these instructions at home: Lifestyle and activity Do not use any products that contain nicotine or tobacco, such as cigarettes and e-cigarettes. If you need help quitting, ask your health care provider. Try to achieve and maintain a healthy body weight. Avoid putting pressure on your abdomen. Anything that puts pressure on your abdomen increases the amount of acid that may be pushed up into your esophagus. Avoid bending over, especially after eating. Raise the head of your bed by putting blocks under the legs. This keeps your head and esophagus higher than your stomach. Do not wear tight clothing around your chest or stomach. Try not to strain when having a bowel movement, when urinating, or when lifting heavy objects. Eating and drinking Avoid foods that can worsen GERD symptoms. These may include: Fatty foods, like fried foods. Citrus fruits, like oranges or lemon. Other foods and drinks that contain acid, like orange juice or tomatoes. Spicy food. Chocolate. Eat frequent small meals instead of three large meals a day. This helps prevent your stomach from getting too full. Eat slowly. Do not lie down right after eating. Do not eat 1-2 hours before bed. Do not drink beverages with caffeine. These include cola,  coffee, cocoa, and tea. Do not drink alcohol. General instructions Take over-the-counter and prescription medicines only as told by your health care provider. Keep all follow-up visits as told by your health care provider. This is important. Contact a health care provider if: Your symptoms are not controlled with medicines or lifestyle changes. You are having trouble swallowing. You have coughing or wheezing that will not go away. Get help right away if: Your pain is getting worse. Your pain spreads to your arms, neck, jaw, teeth, or back. You have shortness of breath. You sweat for no reason. You feel sick to your stomach (nauseous) or you vomit. You vomit blood. You have bright red blood in your stools. You have black, tarry stools. Summary A hiatal hernia occurs when part of the stomach slides above the muscle that separates the abdomen from the chest (diaphragm). A person may be born with a weakness in the hiatus, or a weakness can develop over time. Symptoms of hiatal hernia may include heartburn, trouble swallowing, or sore throat. Management of hiatal hernia includes eating frequent small meals instead of three large meals a day. Get help right away if you vomit blood, have bright red blood in your stools, or have black, tarry stools. This information is not intended to replace advice given to you by  your health care provider. Make sure you discuss any questions you have with your health care provider. Document Revised: 12/06/2020 Document Reviewed: 12/24/2019 Elsevier Patient Education  [place].

## 2021-08-17 ENCOUNTER — Encounter: Payer: Self-pay | Admitting: Surgery

## 2021-08-17 ENCOUNTER — Telehealth: Payer: Self-pay

## 2021-08-17 ENCOUNTER — Ambulatory Visit (INDEPENDENT_AMBULATORY_CARE_PROVIDER_SITE_OTHER): Payer: Medicare Other | Admitting: Vascular Surgery

## 2021-08-17 ENCOUNTER — Encounter (INDEPENDENT_AMBULATORY_CARE_PROVIDER_SITE_OTHER): Payer: Self-pay | Admitting: Vascular Surgery

## 2021-08-17 ENCOUNTER — Telehealth: Payer: Self-pay | Admitting: Surgery

## 2021-08-17 VITALS — BP 148/71 | HR 89 | Resp 17 | Ht 66.0 in | Wt 169.0 lb

## 2021-08-17 DIAGNOSIS — I739 Peripheral vascular disease, unspecified: Secondary | ICD-10-CM | POA: Diagnosis not present

## 2021-08-17 DIAGNOSIS — I10 Essential (primary) hypertension: Secondary | ICD-10-CM | POA: Diagnosis not present

## 2021-08-17 DIAGNOSIS — I480 Paroxysmal atrial fibrillation: Secondary | ICD-10-CM | POA: Diagnosis not present

## 2021-08-17 DIAGNOSIS — I6523 Occlusion and stenosis of bilateral carotid arteries: Secondary | ICD-10-CM | POA: Diagnosis not present

## 2021-08-17 DIAGNOSIS — I2510 Atherosclerotic heart disease of native coronary artery without angina pectoris: Secondary | ICD-10-CM

## 2021-08-17 NOTE — Telephone Encounter (Signed)
Faxed cardiac clearance to Dr. [name] at [phone].

## 2021-08-17 NOTE — Telephone Encounter (Signed)
Outgoing call is made, left message for patient to call.  Please inform regarding scheduled surgery:    Pre-Admission date/time, and Surgery date.  Surgery Date: 09/05/21 Preadmission Testing Date: 08/25/21 (phone 8a-1p)  Also patient to call at [phone], between 1-3:00pm the day before surgery, to find out what time to arrive for surgery.

## 2021-08-17 NOTE — H&P (View-Only) (Signed)
Outpatient Surgical Follow Up  08/17/2021  Christina [name] is an 86 y.o. female.   Chief Complaint  Patient presents with   Follow-up    Discuss hiatal surgery    HPI: Christina [name] is a 86 y.o. female medical history significant for coronary artery disease, COPD, pretension. She Is being referred for evaluation of paraesophageal hernia. She Does have significant history of reflux disease and had prior esophagitis with Schatzki ring that was dilated at some point in time.  She Feels acid taste in her mouth chest pressure and some hoarseness.  She feels heartburn as well.  Currently is taking pantoprazole She does have some regurgitation and chronic cough. No dysphagia  She had a CT scan and swallow recently noting a large hiatal hernia with about half of the stomach within the mediastinum, pers. Reviewed. There was some questionable stricture but endoscopic imaging disprove stricture but confirmed large hernia. She also had esophageal candidiasis and was appropriately treated w diflucan by dr. Vicente Males.  She Does have a significant history of coronary artery disease status post CABG in 2006 Surgical history include hysterectomy and appendectomy. She was scheduled for robotic repair of paraesophageal hernia but wanted to postpone the procedure.  She has been already cleared by cardiology.  She now feels more comfortable with the decision. The husband is very supportive and is at bedside.  Husband used to be a truck driver She is functional and she is able to walk and do all her ADLs.  She is competent. She  Does have some dyspnea on exertion   Past Medical History:  Diagnosis Date   Arthritis    COPD (chronic obstructive pulmonary disease) (HCC)    Coronary artery disease    GERD (gastroesophageal reflux disease)    Hyperlipidemia    Hypertension    Macular degeneration    PAD (peripheral artery disease) ([place])    Sleep apnea     Past Surgical History:  Procedure Laterality  Date   ABDOMINAL HYSTERECTOMY  1980   due to dysfunctional uterine bleeding   APPENDECTOMY  1980   BREAST SURGERY Left 2000   biopsy   CARDIAC CATHETERIZATION     COLONOSCOPY     CORONARY ARTERY BYPASS GRAFT  2006   ESOPHAGOGASTRODUODENOSCOPY (EGD) WITH PROPOFOL N/A 06/08/2021   Procedure: ESOPHAGOGASTRODUODENOSCOPY (EGD) WITH PROPOFOL;  Surgeon: [name], MD;  Location: [facility] ENDOSCOPY;  Service: Gastroenterology;  Laterality: N/A;   HEMORRHOID SURGERY     LOWER EXTREMITY ANGIOGRAPHY Right 02/25/2017   Procedure: LOWER EXTREMITY ANGIOGRAPHY;  Surgeon: Algernon Huxley, MD;  Location: [place] [facility];  Service: Cardiovascular;  Laterality: Right;   LOWER EXTREMITY INTERVENTION  02/25/2017   Procedure: LOWER EXTREMITY INTERVENTION;  Surgeon: Algernon Huxley, MD;  Location: [place] [facility];  Service: Cardiovascular;;   LUNG BIOPSY  1999   Negative   TONSILLECTOMY      Family History  Problem Relation Age of Onset   Hypertension Mother    Hyperlipidemia Mother    Alzheimer's disease Mother    CAD Mother    Heart attack Father    Lung disease Sister    Heart disease Sister    Diabetes Paternal Grandfather        Type 2   Hearing loss Son     Social History:  reports that she quit smoking about 17 years ago. Her smoking use included cigarettes. She has a 2.50 pack-year smoking history. She has never used smokeless tobacco. She reports  that she does not drink alcohol and does not use drugs.  Allergies:  Allergies  Allergen Reactions   Baclofen Other (See Comments)   Codeine Nausea Only   Plavix [Clopidogrel]     brusing all over   Sulfa Antibiotics Nausea Only   Latex Rash    Medications reviewed.    ROS Full ROS performed and is otherwise negative other than what is stated in HPI   BP (!) 174/93   Pulse 93   Temp 97.9 F (36.6 C) (Oral)   Ht '5\' 5"'$  (1.651 m)   Wt 168 lb 9.6 oz (76.5 kg)   SpO2 97%   BMI 28.06 kg/m   Physical Exam CONSTITUTIONAL:  NAd. EYES: Pupils are equal, round, , Sclera are non-icteric. EARS, NOSE, MOUTH AND THROAT: The oral mucosa is pink and moist. Hearing is intact to voice. LYMPH NODES:  Lymph nodes in the neck are normal. RESPIRATORY:  Lungs are clear. There is normal respiratory effort, with equal breath sounds bilaterally, and without pathologic use of accessory muscles. CARDIOVASCULAR: Heart is regular without murmurs, gallops, or rubs. GI: The abdomen is  soft, nontender, and nondistended. There are no palpable masses. There is no hepatosplenomegaly. There are normal bowel sounds in all quadrants. GU: Rectal deferred.   MUSCULOSKELETAL: Normal muscle strength and tone. No cyanosis or edema.   SKIN: Turgor is good and there are no pathologic skin lesions or ulcers. NEUROLOGIC: Motor and sensation is grossly normal. Cranial nerves are grossly intact. PSYCH:  Oriented to person, place and time. Affect is normal.     Assessment/Plan: 86 year old fairly functional female with large paraesophageal hernia that is symptomatic.  Discussed with her in detail.  Given her symptoms I do think that this will need to be repaired  I do think that she will benefit from robotic repair of paraesophageal type III w mesh and Nissen fundoplication. Clinically she dos not have dysphagia and endoscopy shows a patent esophagus. Surgery d/w the pt in detail, risks, benefits and possible complications ( including but not limited to bleeding, infection, bowel and esophageal injuries, prolonged hospitalization)  She understands and wishes to proceed. We will stop eliquis 3 days before surgery Please note that I spent greater than 40 minutes in this encounter including coordination of her care, placing orders, personally reviewing imaging studies, counseling the patient and performing appropriate documentation   Caroleen Hamman, MD [place] [facility]geon

## 2021-08-17 NOTE — Progress Notes (Signed)
Outpatient Surgical Follow Up  08/17/2021  Christina [name] is an 86 y.o. female.   Chief Complaint  Patient presents with   Follow-up    Discuss hiatal surgery    HPI: Christina [name] is a 86 y.o. female medical history significant for coronary artery disease, COPD, pretension. She Is being referred for evaluation of paraesophageal hernia. She Does have significant history of reflux disease and had prior esophagitis with Schatzki ring that was dilated at some point in time.  She Feels acid taste in her mouth chest pressure and some hoarseness.  She feels heartburn as well.  Currently is taking pantoprazole She does have some regurgitation and chronic cough. No dysphagia  She had a CT scan and swallow recently noting a large hiatal hernia with about half of the stomach within the mediastinum, pers. Reviewed. There was some questionable stricture but endoscopic imaging disprove stricture but confirmed large hernia. She also had esophageal candidiasis and was appropriately treated w diflucan by dr. Vicente Males.  She Does have a significant history of coronary artery disease status post CABG in 2006 Surgical history include hysterectomy and appendectomy. She was scheduled for robotic repair of paraesophageal hernia but wanted to postpone the procedure.  She has been already cleared by cardiology.  She now feels more comfortable with the decision. The husband is very supportive and is at bedside.  Husband used to be a truck driver She is functional and she is able to walk and do all her ADLs.  She is competent. She  Does have some dyspnea on exertion   Past Medical History:  Diagnosis Date   Arthritis    COPD (chronic obstructive pulmonary disease) (HCC)    Coronary artery disease    GERD (gastroesophageal reflux disease)    Hyperlipidemia    Hypertension    Macular degeneration    PAD (peripheral artery disease) ([place])    Sleep apnea     Past Surgical History:  Procedure Laterality  Date   ABDOMINAL HYSTERECTOMY  1980   due to dysfunctional uterine bleeding   APPENDECTOMY  1980   BREAST SURGERY Left 2000   biopsy   CARDIAC CATHETERIZATION     COLONOSCOPY     CORONARY ARTERY BYPASS GRAFT  2006   ESOPHAGOGASTRODUODENOSCOPY (EGD) WITH PROPOFOL N/A 06/08/2021   Procedure: ESOPHAGOGASTRODUODENOSCOPY (EGD) WITH PROPOFOL;  Surgeon: [name], MD;  Location: [facility] ENDOSCOPY;  Service: Gastroenterology;  Laterality: N/A;   HEMORRHOID SURGERY     LOWER EXTREMITY ANGIOGRAPHY Right 02/25/2017   Procedure: LOWER EXTREMITY ANGIOGRAPHY;  Surgeon: Algernon Huxley, MD;  Location: [place] [facility];  Service: Cardiovascular;  Laterality: Right;   LOWER EXTREMITY INTERVENTION  02/25/2017   Procedure: LOWER EXTREMITY INTERVENTION;  Surgeon: Algernon Huxley, MD;  Location: [place] [facility];  Service: Cardiovascular;;   LUNG BIOPSY  1999   Negative   TONSILLECTOMY      Family History  Problem Relation Age of Onset   Hypertension Mother    Hyperlipidemia Mother    Alzheimer's disease Mother    CAD Mother    Heart attack Father    Lung disease Sister    Heart disease Sister    Diabetes Paternal Grandfather        Type 2   Hearing loss Son     Social History:  reports that she quit smoking about 17 years ago. Her smoking use included cigarettes. She has a 2.50 pack-year smoking history. She has never used smokeless tobacco. She reports  that she does not drink alcohol and does not use drugs.  Allergies:  Allergies  Allergen Reactions   Baclofen Other (See Comments)   Codeine Nausea Only   Plavix [Clopidogrel]     brusing all over   Sulfa Antibiotics Nausea Only   Latex Rash    Medications reviewed.    ROS Full ROS performed and is otherwise negative other than what is stated in HPI   BP (!) 174/93   Pulse 93   Temp 97.9 F (36.6 C) (Oral)   Ht '5\' 5"'$  (1.651 m)   Wt 168 lb 9.6 oz (76.5 kg)   SpO2 97%   BMI 28.06 kg/m   Physical Exam CONSTITUTIONAL:  NAd. EYES: Pupils are equal, round, , Sclera are non-icteric. EARS, NOSE, MOUTH AND THROAT: The oral mucosa is pink and moist. Hearing is intact to voice. LYMPH NODES:  Lymph nodes in the neck are normal. RESPIRATORY:  Lungs are clear. There is normal respiratory effort, with equal breath sounds bilaterally, and without pathologic use of accessory muscles. CARDIOVASCULAR: Heart is regular without murmurs, gallops, or rubs. GI: The abdomen is  soft, nontender, and nondistended. There are no palpable masses. There is no hepatosplenomegaly. There are normal bowel sounds in all quadrants. GU: Rectal deferred.   MUSCULOSKELETAL: Normal muscle strength and tone. No cyanosis or edema.   SKIN: Turgor is good and there are no pathologic skin lesions or ulcers. NEUROLOGIC: Motor and sensation is grossly normal. Cranial nerves are grossly intact. PSYCH:  Oriented to person, place and time. Affect is normal.     Assessment/Plan: 86 year old fairly functional female with large paraesophageal hernia that is symptomatic.  Discussed with her in detail.  Given her symptoms I do think that this will need to be repaired  I do think that she will benefit from robotic repair of paraesophageal type III w mesh and Nissen fundoplication. Clinically she dos not have dysphagia and endoscopy shows a patent esophagus. Surgery d/w the pt in detail, risks, benefits and possible complications ( including but not limited to bleeding, infection, bowel and esophageal injuries, prolonged hospitalization)  She understands and wishes to proceed. We will stop eliquis 3 days before surgery Please note that I spent greater than 40 minutes in this encounter including coordination of her care, placing orders, personally reviewing imaging studies, counseling the patient and performing appropriate documentation   Caroleen Hamman, MD [place] Surgeon

## 2021-08-17 NOTE — Telephone Encounter (Signed)
Patient calls back, she is now informed of all dates regarding her surgery and verbalized understanding.

## 2021-08-18 ENCOUNTER — Encounter (INDEPENDENT_AMBULATORY_CARE_PROVIDER_SITE_OTHER): Payer: Self-pay | Admitting: Vascular Surgery

## 2021-08-21 ENCOUNTER — Telehealth: Payer: Self-pay

## 2021-08-21 NOTE — Telephone Encounter (Signed)
Received cardiac clearance from Dr. [name]. Pt's risk assessment is low and is optimized for surgery.

## 2021-08-23 ENCOUNTER — Ambulatory Visit: Payer: Medicare Other | Admitting: Gastroenterology

## 2021-08-25 ENCOUNTER — Encounter
Admission: RE | Admit: 2021-08-25 | Discharge: 2021-08-25 | Disposition: A | Discharge status: Home / Self Care | Payer: Medicare Other | Source: Ambulatory Visit | Attending: Surgery | Admitting: Surgery

## 2021-08-25 ENCOUNTER — Other Ambulatory Visit: Payer: Self-pay

## 2021-08-25 DIAGNOSIS — G4733 Obstructive sleep apnea (adult) (pediatric): Secondary | ICD-10-CM

## 2021-08-25 DIAGNOSIS — I480 Paroxysmal atrial fibrillation: Secondary | ICD-10-CM

## 2021-08-25 HISTORY — DX: Chronic kidney disease, unspecified: N18.9

## 2021-08-25 HISTORY — DX: Unspecified atrial fibrillation: I48.91

## 2021-08-25 HISTORY — DX: Dyspnea, unspecified: R06.00

## 2021-08-25 NOTE — Patient Instructions (Addendum)
Your procedure is scheduled on:09/05/2021 Report to the Registration Desk on the 1st floor of the [facility]. To find out your arrival time, please call [phone] between 1PM - 3PM on: 09/04/2021  If your arrival time is 6:00 am, do not arrive prior to that time as the [facility] entrance doors do not open until 6:00 am.  REMEMBER: Instructions that are not followed completely may result in serious medical risk, up to and including death; or upon the discretion of your surgeon and anesthesiologist your surgery may need to be rescheduled.  Do not eat food after midnight the night before surgery.  No gum chewing, lozengers or hard candies.  You may however, drink CLEAR liquids up to 2 hours before you are scheduled to arrive for your surgery. Do not drink anything within 2 hours of your scheduled arrival time.   Clear liquids include: - water  - apple juice without pulp - gatorade (not RED colors) - black coffee or tea (Do NOT add milk or creamers to the coffee or tea) Do NOT drink anything that is not on this list.   TAKE THESE MEDICATIONS THE MORNING OF SURGERY WITH A SIP OF WATER: Metoprolol Tartrate 2. pantoprazole - (take one the night before and one on the morning of surgery - helps to prevent nausea after surgery.)  Use inhalers at home  on the day of surgery .   Follow recommendations from Cardiologist, Pulmonologist or PCP regarding stopping Eliquis. You were instructed to verify with Dr. [name] about ELiquis.  One week prior to surgery: Stop Anti-inflammatories (NSAIDS) such as Advil, Aleve, Ibuprofen, Motrin, Naproxen, Naprosyn and Aspirin based products such as Excedrin, Goodys Powder, BC Powder. Stop ANY OVER THE COUNTER supplements until after surgery Coenzyme Q10, (VITAMIN C) ,calcium, multivitamin.                                                                           You may however, continue to take Tylenol if needed for pain up until the day of  surgery.  No Alcohol for 24 hours before or after surgery.  No Smoking including e-cigarettes for 24 hours prior to surgery.  No chewable tobacco products for at least 6 hours prior to surgery.  No nicotine patches on the day of surgery.  Do not use any "recreational" drugs for at least a week prior to your surgery.  Please be advised that the combination of cocaine and anesthesia may have negative outcomes, up to and including death. If you test positive for cocaine, your surgery will be cancelled.  On the morning of surgery brush your teeth with toothpaste and water, you may rinse your mouth with mouthwash if you wish. Do not swallow any toothpaste or mouthwash.  Use CHG Soap  as directed on instruction sheet.  Do not wear jewelry, make-up, hairpins, clips or nail polish.  Do not wear lotions, powders, or perfumes.   Do not shave body from the neck down 48 hours prior to surgery just in case you cut yourself which could leave a site for infection.  Also, freshly shaved skin may become irritated if using the CHG soap.  Contact lenses, hearing aids and dentures may not be worn into  surgery.  Do not bring valuables to the hospital. [facility] is not responsible for any missing/lost belongings or valuables.   Bring your C-PAP to the hospital with you in case you may have to spend the night.   Notify your doctor if there is any change in your medical condition (cold, fever, infection).  Wear comfortable clothing (specific to your surgery type) to the hospital.  After surgery, you can help prevent lung complications by doing breathing exercises.  Take deep breaths and cough every 1-2 hours. Your doctor may order a device called an Incentive Spirometer to help you take deep breaths.   If you are being admitted to the hospital overnight, leave your suitcase in the car. After surgery it may be brought to your room.  If you are being discharged the day of surgery, you will not be  allowed to drive home. You will need a responsible adult (18 years or older) to drive you home and stay with you that night.   If you are taking public transportation, you will need to have a responsible adult (18 years or older) with you. Please confirm with your physician that it is acceptable to use public transportation.   Please call the [facility] Dept. at [phone] if you have any questions about these instructions.  Surgery Visitation Policy:  Patients undergoing a surgery or procedure may have two family members or support persons with them as long as the person is not COVID-19 positive or experiencing its symptoms.   Inpatient Visitation:    Visiting hours are 7 a.m. to 8 p.m. Up to four visitors are allowed at one time in a patient room, including children. The visitors may rotate out with other people during the day. One designated support person (adult) may remain overnight.

## 2021-08-29 ENCOUNTER — Encounter
Admission: RE | Admit: 2021-08-29 | Discharge: 2021-08-29 | Disposition: A | Discharge status: Home / Self Care | Payer: Medicare Other | Source: Ambulatory Visit | Attending: Surgery | Admitting: Surgery

## 2021-08-29 DIAGNOSIS — Z9989 Dependence on other enabling machines and devices: Secondary | ICD-10-CM | POA: Insufficient documentation

## 2021-08-29 DIAGNOSIS — G4733 Obstructive sleep apnea (adult) (pediatric): Secondary | ICD-10-CM

## 2021-08-29 DIAGNOSIS — I480 Paroxysmal atrial fibrillation: Secondary | ICD-10-CM | POA: Insufficient documentation

## 2021-08-29 DIAGNOSIS — Z01812 Encounter for preprocedural laboratory examination: Secondary | ICD-10-CM | POA: Insufficient documentation

## 2021-08-29 LAB — BASIC METABOLIC PANEL
Anion gap: 9 (ref 5–15)
BUN: 23 mg/dL (ref 8–23)
CO2: 27 mmol/L (ref 22–32)
Calcium: 9.4 mg/dL (ref 8.9–10.3)
Chloride: 106 mmol/L (ref 98–111)
Creatinine, Ser: 1.14 mg/dL — ABNORMAL HIGH (ref 0.44–1.00)
GFR, Estimated: 48 mL/min — ABNORMAL LOW (ref >60)
Glucose, Bld: 87 mg/dL (ref 70–99)
Potassium: 4.2 mmol/L (ref 3.5–5.1)
Sodium: 142 mmol/L (ref 135–145)

## 2021-08-29 LAB — CBC
HCT: 40.8 % (ref 36.0–46.0)
Hemoglobin: 13.1 g/dL (ref 12.0–15.0)
MCH: 33.6 pg (ref 26.0–34.0)
MCHC: 32.1 g/dL (ref 30.0–36.0)
MCV: 104.6 fL — ABNORMAL HIGH (ref 80.0–100.0)
Platelets: 166 10*3/uL (ref 150–400)
RBC: 3.9 MIL/uL (ref 3.87–5.11)
RDW: 13.4 % (ref 11.5–15.5)
WBC: 6.4 10*3/uL (ref 4.0–10.5)
nRBC: 0 % (ref 0.0–0.2)

## 2021-08-31 ENCOUNTER — Encounter: Payer: Self-pay | Admitting: Surgery

## 2021-08-31 NOTE — Progress Notes (Signed)
Perioperative Services  Pre-Admission/Anesthesia Testing Clinical Review  Date: 09/01/21  Patient Demographics:  Name: Christina [name] DOB:   [birth year] MRN:   [id number]  Planned Surgical Procedure(s):    Case: [id number] Date/Time: 09/05/21 0922   Procedure: XI ROBOTIC ASSISTED PARAESOPHAGEAL HERNIA REPAIR, RNFA to assist   Anesthesia type: General   Pre-op diagnosis: Paraesophageal hernia   Location: [facility] OR ROOM 04 / [place] ORS FOR ANESTHESIA GROUP   Surgeons: Jules Husbands, MD   NOTE: Available PAT nursing documentation and vital signs have been reviewed. Clinical nursing staff has updated patient's PMH/PSHx, current medication list, and drug allergies/intolerances to ensure comprehensive history available to assist in medical decision making as it pertains to the aforementioned surgical procedure and anticipated anesthetic course. Extensive review of available clinical information performed. [place] PM[facility] and PSHx updated with any diagnoses/procedures that  may have been inadvertently omitted during her intake with the pre-admission testing department's nursing staff.  Clinical Discussion:  Christina [name] is a 86 y.o. female who is submitted for pre-surgical anesthesia review and clearance prior to her undergoing the above procedure. Patient is a Former Smoker (2.5 pack years; quit 04/2004). Pertinent PMH includes: CAD, PAF, PSVT, valvular heart disease, PVD, aortic atherosclerosis, bilateral carotid artery stenosis, DOE, COPD, OSAH (requires nocturnal PAP therapy), GERD (on daily PPI), hiatal hernia, Barrett's esophagus, CKD-III, anemia, OA, thoracolumbar scoliosis.  Patient is followed by cardiology [name], MD). She was last seen in the cardiology clinic on 05/29/2021; notes reviewed.  At the time of her clinic visit, patient denied any episodes of chest pain, however was experiencing chronic shortness of breath related to her underlying COPD diagnosis.  She was also  experiencing peripheral edema for which she is treating with extremity elevation and antiembolism stockings.  Patient denied any PND, orthopnea, palpitations, vertiginous symptoms, or presyncope/syncope.  Patient with a past medical history significant for cardiovascular diagnoses.  Patient underwent diagnostic left heart catheterization on 04/21/2004 revealing a normal left ventricular systolic function with an EF of 66%.  There was multivessel CAD; 50% distal LCx, 75% proximal LCx, 75% ostial RCA, and 50% proximal RCA.  Patient was transferred to [facility] for CVTS consult regarding CABG procedure.  Patient underwent three-vessel [facility]G at [facility] in 04/2004.  Patient underwent a peripheral vascular angiography study on 12/10/2016.  Study revealed significant stenosis and the BILATERAL common iliac arteries.  PTA was performed placing 7 x 26 mm stents to the BILATERAL common iliac arteries.  Most recent TTE was performed on 04/10/2021 revealing a low normal left ventricular systolic function with an EF of 50-55%.  There was mild LVH.  Diastolic Doppler parameters indeterminate.  Right ventricular systolic function mildly reduced.  There was moderate biatrial enlargement.  There was mild to moderate mitral valve regurgitation, in addition to severe tricuspid valve regurgitation.  There was no evidence of a significant transvalvular gradient to suggest stenosis.  Carotid Doppler study performed on 08/03/2021 revealing 40-59% stenosis of the LICA, with a near normal vessel contralaterally.  BILATERAL vertebral arteries demonstrated antegrade flow.  Patient with an atrial fibrillation diagnosis; CHA2DS2-VASc Score = 6 (age x2, sex, HTN, vascular disease, T2DM). Her rate and rhythm are currently being maintained on oral metoprolol tartrate. She is chronically anticoagulated using reduced dose apixaban; reported to be compliant with therapy with no evidence or reports of GI bleeding.   Blood pressure well controlled at 120/80 on currently prescribed beta-blocker monotherapy. She is on rosuvastatin for her HLD diagnosis and  further ASCVD prevention.  Patient is not diabetic. She has an OSAH diagnosis and is compliant with prescribed nocturnal PAP therapy. Functional capacity limited by age and multiple medical comorbidities.  With that being said, patient questionably able to achieve 4 METS of activity without experiencing angina/anginal equivalent symptoms.  No changes were made to her medication regimen.  Patient follow-up with outpatient cardiology and 6 months or sooner if needed.  [name] is scheduled for an XI ROBOTIC ASSISTED PARAESOPHAGEAL HERNIA REPAIR on 09/05/2021 with Dr. Caroleen Hamman, MD. Given patient's past medical history significant for cardiovascular diagnoses, presurgical cardiac clearance was sought by the performing surgeon's office and PAT team. Per cardiology, "this patient is optimized for surgery and may proceed with the planned procedural course with a LOW risk of significant perioperative cardiovascular complications".  Again, this patient is on daily anticoagulation therapy.  She has been instructed on recommendations from her cardiologist for holding her apixaban dose for 3 days prior to her procedure with plans to restart as soon as postoperatively respectively minimized by her primary attending surgeon.  Patient is aware that her last dose of apixaban should be on 09/01/2021.  Patient denies previous perioperative complications with anesthesia in the past. In review of the available records, it is noted that patient underwent a general anesthetic course here at [facility] ([facility]) in 06/2021 without documented complications.      08/17/2021   10:33 AM 08/16/2021    1:46 PM 07/17/2021   10:29 AM  Vitals with BMI  Height '5\' 6"'$  '5\' 5"'$  '5\' 6"'$   Weight 169 lbs 168 lbs 10 oz 169 lbs 6 oz  BMI 27.29 40.97 35.32  Systolic  [phone]  Diastolic 71 93 70  Pulse 89 93 74    Providers/Specialists:   NOTE: Primary physician provider listed below. Patient may have been seen by APP or partner within same practice.   PROVIDER ROLE / SPECIALTY LAST Suszanne Finch, MD General Surgery (Surgeon) 08/16/2021  [name], FNP Primary Care Provider 07/17/2021  Katrine Coho, MD Cardiology 05/29/2021  Hortencia Pilar, MD Vascular Surgery 08/17/2021  Devona Konig, MD Pulmonary Medicine 07/17/2021   Allergies:  Baclofen, Codeine, Plavix [clopidogrel], Sulfa antibiotics, and Latex  Current Home Medications:   No current facility-administered medications for this encounter.    acetaminophen (TYLENOL) 500 MG tablet   albuterol (VENTOLIN HFA) 108 (90 Base) MCG/ACT inhaler   apixaban (ELIQUIS) 2.5 MG TABS tablet   Ascorbic Acid (VITAMIN C) 1000 MG tablet   azelastine (ASTELIN) 0.1 % nasal spray   Calcium Carb-Cholecalciferol (CALCIUM 600+D3 PO)   carboxymethylcellulose (REFRESH PLUS) 0.5 % SOLN   Coenzyme Q10 (COQ-10) 100 MG capsule   docusate sodium (COLACE) 100 MG capsule   hydrochlorothiazide (HYDRODIURIL) 12.5 MG tablet   Metoprolol Tartrate 75 MG TABS   Multiple Vitamin (MULTI-VITAMIN DAILY PO)   pantoprazole (PROTONIX) 40 MG tablet   rosuvastatin (CRESTOR) 20 MG tablet   SYMBICORT 160-4.5 MCG/ACT inhaler   Tiotropium Bromide-Olodaterol (STIOLTO RESPIMAT) 2.5-2.5 MCG/ACT AERS   tobramycin (TOBREX) 0.3 % ophthalmic solution   History:   Past Medical History:  Diagnosis Date   Adenomatous colon polyp    Anemia    Aortic atherosclerosis (HCC)    Arthritis    Barrett's esophagus    Bilateral carotid artery disease (HCC)    a.) carotid doppler 07/18/2020: 1-96% RICA, 22-29% LICA; b.) carotid doppler 79/89/2119: 41-74% LICA, no sig RICA; c.) carotid doppler  01/23/2021: 4-19% RICA, 37-90% LICA; d.) carotid doppler 24/10/7351: 29-92% LICA, near norm RICA   CKD (chronic kidney disease), stage III  (HCC)    COPD (chronic obstructive pulmonary disease) ([place])    Coronary artery disease 04/21/2004   a.) LHC 04/21/2004 [facility] EF 66%, 50% dLCx, 75% pLCx, 75% oRCA, 50% pRCA --> transferred to [facility] for CVTS consult; b.) 3v CABG   Dyspnea on exertion    GERD (gastroesophageal reflux disease)    Hiatal hernia    Hyperlipidemia    Hypertension    Long term current use of anticoagulant    a.) reduced dose apixaban   Macular degeneration    OSA on CPAP    PAD (peripheral artery disease) ([place]) 12/10/2016   a.) PTA of BILATERAL common iliac arteries --> 7 x 26 mm stents placed   PAF (paroxysmal atrial fibrillation) (HCC)    a.) CHA2DS2-VASc = 6 (age x 2, sex, HTN, vascular disease, T2DM);  b.) rate/rhythm maintained on oral metoprolol tartrate; chronically anticoagulated using dose reduced apixaban   Peripheral edema    PSVT (paroxysmal supraventricular tachycardia) ([place])    S/P CABG x 3 04/2004   Schatzki's ring    Scoliosis of thoracolumbar spine    Valvular heart disease    a.) TTE 04/20/2004: EF >55%, mild MR; b.)  TTE 04/10/2021: EF 50-55%, LVH, reduced RV SF, RVE, moderate BAE, mild-mod MR, severe TR.   Past Surgical History:  Procedure Laterality Date   ABDOMINAL HYSTERECTOMY  1980   due to dysfunctional uterine bleeding   APPENDECTOMY  1980   BREAST SURGERY Left 2000   biopsy   CARDIAC CATHETERIZATION     COLONOSCOPY     CORONARY ARTERY BYPASS GRAFT  2006   ESOPHAGOGASTRODUODENOSCOPY (EGD) WITH PROPOFOL N/A 06/08/2021   Procedure: ESOPHAGOGASTRODUODENOSCOPY (EGD) WITH PROPOFOL;  Surgeon: [name], MD;  [facility] ENDOSCOPY;  Service: Gastroenterology;  Laterality: N/A;   HEMORRHOID SURGERY     LOWER EXTREMITY ANGIOGRAPHY Right 02/25/2017   Procedure: LOWER EXTREMITY ANGIOGRAPHY;  Surgeon: Algernon Huxley, MD;  Location: [place] CV LAB;  Service: Cardiovascular;  Laterality: Right;   LOWER EXTREMITY INTERVENTION  02/25/2017   Procedure: LOWER EXTREMITY  INTERVENTION;  Surgeon: Algernon Huxley, MD;  Location: [place] CV LAB;  Service: Cardiovascular;;   LUNG BIOPSY  1999   Negative   TONSILLECTOMY     Family History  Problem Relation Age of Onset   Hypertension Mother    Hyperlipidemia Mother    Alzheimer's disease Mother    CAD Mother    Heart attack Father    Lung disease Sister    Heart disease Sister    Diabetes Paternal Grandfather        Type 2   Hearing loss Son    Social History   Tobacco Use   Smoking status: Former    Packs/day: 0.50    Years: 5.00    Total pack years: 2.50    Types: Cigarettes    Quit date: 04/24/2004    Years since quitting: 17.3   Smokeless tobacco: Never  Vaping Use   Vaping Use: Never used  Substance Use Topics   Alcohol use: No   Drug use: No    Pertinent Clinical Results:  LABS: Labs reviewed: Acceptable for surgery.  Hospital Outpatient Visit on 08/29/2021  Component Date Value Ref Range Status   ABO/RH(D) 08/29/2021 A NEG   Final   Antibody Screen 08/29/2021 NEG   Final   Sample  Expiration 08/29/2021 09/12/2021, 2359   Final   Extend sample reason 08/29/2021    Final                   Value:NO TRANSFUSIONS OR PREGNANCY IN THE PAST 3 MONTHS Performed at [facility], [place], [place] [address]    Sodium 08/29/2021 142  135 - 145 mmol/L Final   Potassium 08/29/2021 4.2  3.5 - 5.1 mmol/L Final   Chloride 08/29/2021 106  98 - 111 mmol/L Final   CO2 08/29/2021 27  22 - 32 mmol/L Final   Glucose, Bld 08/29/2021 87  70 - 99 mg/dL Final   Glucose reference range applies only to samples taken after fasting for at least 8 hours.   BUN 08/29/2021 23  8 - 23 mg/dL Final   Creatinine, Ser 08/29/2021 1.14 (H)  0.44 - 1.00 mg/dL Final   Calcium 08/29/2021 9.4  8.9 - 10.3 mg/dL Final   GFR, Estimated 08/29/2021 48 (L)  >60 mL/min Final   Comment: (NOTE) Calculated using the CKD-EPI Creatinine Equation (2021)    Anion gap 08/29/2021 9  5 - 15 Final    Performed at [facility], [address] [place], [place] [address]   WBC 08/29/2021 6.4  4.0 - 10.5 K/uL Final   RBC 08/29/2021 3.90  3.87 - 5.11 MIL/uL Final   Hemoglobin 08/29/2021 13.1  12.0 - 15.0 g/dL Final   HCT 08/29/2021 40.8  36.0 - 46.0 % Final   MCV 08/29/2021 104.6 (H)  80.0 - 100.0 fL Final   MCH 08/29/2021 33.6  26.0 - 34.0 pg Final   MCHC 08/29/2021 32.1  30.0 - 36.0 g/dL Final   RDW 08/29/2021 13.4  11.5 - 15.5 % Final   Platelets 08/29/2021 166  150 - 400 K/uL Final   nRBC 08/29/2021 0.0  0.0 - 0.2 % Final   Performed at [facility], [address] [place], [place] [address]    ECG: Date: 04/09/2021 Time ECG obtained: 2039 PM Rate: 78 bpm Rhythm: atrial fibrillation Axis (leads I and aVF): Normal Intervals: QRS 70 ms. QTc 54 ms. ST segment and T wave changes: Evidence of an age undetermined septal infarct; prolonged QTc Comparison: Similar to previous tracing obtained on 04/06/2021.  QTc interval had increased when compared to previous tracing.   IMAGING / PROCEDURES: VAS US CAROTID performed on 08/03/2021 Right Carotid: The extracranial vessels were near-normal with only minimal wall thickening or plaque.  Left Carotid: Velocities in the left ICA are consistent with a 40-59% stenosis. Tortuous proximal ICA with mild calcified plaque.  Vertebrals:  Bilateral vertebral arteries demonstrate antegrade flow.  Subclavians: Normal flow hemodynamics were seen in bilateral subclavian arteries.   CT ABDOMEN PELVIS W CONTRAST performed on 05/19/2021 Interval development of trace volume right pleural effusion. Stable large hilar hernia containing the almost the entirety of the stomach. No associated findings suggest associated ischemia or bowel obstruction. Diffuse sigmoid diverticulosis with no definite finding of acute diverticulitis. A 1 x 0.6 cm right middle lobe pulmonary nodule. Non-contrast chest CT at 6-12 months is recommended. If the nodule  is stable at time of repeat CT, then future CT at 18-24 months (from today's scan) is considered optional for low-risk patients, but is recommended for high-risk patients.  Aortic atherosclerosis  Right common iliac artery stent patent.  TRANSTHORACIC ECHOCARDIOGRAM performed on 04/10/2021 Left ventricular ejection fraction, by estimation, is 50 to 55%. The left ventricle has low normal function.  The left ventricle has no regional wall motion abnormalities. There is mild left ventricular hypertrophy. Left ventricular diastolic parameters are indeterminate.  Right ventricular systolic function is mildly reduced. The right ventricular size is mildly enlarged.  Left atrial size was moderately dilated.  Right atrial size was moderately dilated.  The mitral valve is normal in structure. Mild to moderate mitral valve regurgitation. No evidence of mitral stenosis.  Tricuspid valve regurgitation is severe.  The aortic valve is normal in structure. Aortic valve regurgitation is not visualized. No aortic stenosis is present.   Impression and Plan:  DANIQUA CAMPOY has been referred for pre-anesthesia review and clearance prior to her undergoing the planned anesthetic and procedural courses. Available labs, pertinent testing, and imaging results were personally reviewed by me. This patient has been appropriately cleared by cardiology with an overall LOW risk of significant perioperative cardiovascular complications.  Based on clinical review performed today (09/01/21), barring any significant acute changes in the patient's overall condition, it is anticipated that she will be able to proceed with the planned surgical intervention. Any acute changes in clinical condition may necessitate her procedure being postponed and/or cancelled. Patient will meet with anesthesia team (MD and/or CRNA) on the day of her procedure for preoperative evaluation/assessment. Questions regarding anesthetic course will be fielded  at that time.   Pre-surgical instructions were reviewed with the patient during her PAT appointment and questions were fielded by PAT clinical staff. Patient was advised that if any questions or concerns arise prior to her procedure then she should return a call to PAT and/or her surgeon's office to discuss.  Honor Loh, MSN, APRN, FNP-C, CEN [facility]  Peri-operative Services Nurse Practitioner Phone: [phone] Fax: [phone] 09/01/21 1:53 PM  NOTE: This note has been prepared using Dragon dictation software. Despite my best ability to proofread, there is always the potential that unintentional transcriptional errors may still occur from this process.

## 2021-09-05 ENCOUNTER — Encounter
Admission: RE | Disposition: A | Discharge status: Home Health | Payer: Self-pay | Source: Home / Self Care | Attending: Surgery

## 2021-09-05 ENCOUNTER — Encounter: Payer: Self-pay | Admitting: Surgery

## 2021-09-05 ENCOUNTER — Inpatient Hospital Stay
Admission: RE | Admit: 2021-09-05 | Discharge: 2021-09-18 | DRG: 326 | Disposition: A | Discharge status: Home Health | Payer: Medicare Other | Attending: Surgery | Admitting: Surgery

## 2021-09-05 ENCOUNTER — Ambulatory Visit: Payer: Medicare Other | Admitting: Urgent Care

## 2021-09-05 ENCOUNTER — Other Ambulatory Visit: Payer: Self-pay

## 2021-09-05 DIAGNOSIS — Z83438 Family history of other disorder of lipoprotein metabolism and other lipidemia: Secondary | ICD-10-CM

## 2021-09-05 DIAGNOSIS — I129 Hypertensive chronic kidney disease with stage 1 through stage 4 chronic kidney disease, or unspecified chronic kidney disease: Secondary | ICD-10-CM | POA: Diagnosis not present

## 2021-09-05 DIAGNOSIS — N1831 Chronic kidney disease, stage 3a: Secondary | ICD-10-CM | POA: Diagnosis present

## 2021-09-05 DIAGNOSIS — I25810 Atherosclerosis of coronary artery bypass graft(s) without angina pectoris: Secondary | ICD-10-CM | POA: Diagnosis not present

## 2021-09-05 DIAGNOSIS — D696 Thrombocytopenia, unspecified: Secondary | ICD-10-CM | POA: Diagnosis not present

## 2021-09-05 DIAGNOSIS — I480 Paroxysmal atrial fibrillation: Secondary | ICD-10-CM | POA: Diagnosis present

## 2021-09-05 DIAGNOSIS — I700 Atherosclerosis of aorta: Secondary | ICD-10-CM | POA: Diagnosis not present

## 2021-09-05 DIAGNOSIS — E785 Hyperlipidemia, unspecified: Secondary | ICD-10-CM | POA: Diagnosis present

## 2021-09-05 DIAGNOSIS — J9601 Acute respiratory failure with hypoxia: Secondary | ICD-10-CM | POA: Diagnosis present

## 2021-09-05 DIAGNOSIS — K449 Diaphragmatic hernia without obstruction or gangrene: Principal | ICD-10-CM | POA: Diagnosis present

## 2021-09-05 DIAGNOSIS — B3781 Candidal esophagitis: Secondary | ICD-10-CM | POA: Diagnosis present

## 2021-09-05 DIAGNOSIS — R509 Fever, unspecified: Secondary | ICD-10-CM | POA: Diagnosis not present

## 2021-09-05 DIAGNOSIS — G4733 Obstructive sleep apnea (adult) (pediatric): Secondary | ICD-10-CM | POA: Diagnosis not present

## 2021-09-05 DIAGNOSIS — Z8719 Personal history of other diseases of the digestive system: Secondary | ICD-10-CM

## 2021-09-05 DIAGNOSIS — Z91040 Latex allergy status: Secondary | ICD-10-CM | POA: Diagnosis not present

## 2021-09-05 DIAGNOSIS — Z951 Presence of aortocoronary bypass graft: Secondary | ICD-10-CM

## 2021-09-05 DIAGNOSIS — Z955 Presence of coronary angioplasty implant and graft: Secondary | ICD-10-CM

## 2021-09-05 DIAGNOSIS — J449 Chronic obstructive pulmonary disease, unspecified: Secondary | ICD-10-CM | POA: Diagnosis present

## 2021-09-05 DIAGNOSIS — I959 Hypotension, unspecified: Secondary | ICD-10-CM | POA: Diagnosis not present

## 2021-09-05 DIAGNOSIS — I2510 Atherosclerotic heart disease of native coronary artery without angina pectoris: Secondary | ICD-10-CM | POA: Diagnosis not present

## 2021-09-05 DIAGNOSIS — I10 Essential (primary) hypertension: Secondary | ICD-10-CM | POA: Diagnosis present

## 2021-09-05 DIAGNOSIS — K219 Gastro-esophageal reflux disease without esophagitis: Secondary | ICD-10-CM | POA: Diagnosis present

## 2021-09-05 DIAGNOSIS — H3530 Unspecified macular degeneration: Secondary | ICD-10-CM | POA: Diagnosis not present

## 2021-09-05 DIAGNOSIS — N189 Chronic kidney disease, unspecified: Secondary | ICD-10-CM | POA: Diagnosis not present

## 2021-09-05 DIAGNOSIS — D72819 Decreased white blood cell count, unspecified: Secondary | ICD-10-CM | POA: Diagnosis not present

## 2021-09-05 DIAGNOSIS — Z885 Allergy status to narcotic agent status: Secondary | ICD-10-CM | POA: Diagnosis not present

## 2021-09-05 DIAGNOSIS — J90 Pleural effusion, not elsewhere classified: Secondary | ICD-10-CM | POA: Diagnosis not present

## 2021-09-05 DIAGNOSIS — Z9889 Other specified postprocedural states: Secondary | ICD-10-CM | POA: Diagnosis not present

## 2021-09-05 DIAGNOSIS — K2289 Other specified disease of esophagus: Secondary | ICD-10-CM | POA: Diagnosis not present

## 2021-09-05 DIAGNOSIS — Z7951 Long term (current) use of inhaled steroids: Secondary | ICD-10-CM

## 2021-09-05 DIAGNOSIS — Z87891 Personal history of nicotine dependence: Secondary | ICD-10-CM

## 2021-09-05 DIAGNOSIS — Z888 Allergy status to other drugs, medicaments and biological substances status: Secondary | ICD-10-CM | POA: Diagnosis not present

## 2021-09-05 DIAGNOSIS — Z882 Allergy status to sulfonamides status: Secondary | ICD-10-CM

## 2021-09-05 DIAGNOSIS — I739 Peripheral vascular disease, unspecified: Secondary | ICD-10-CM | POA: Diagnosis not present

## 2021-09-05 DIAGNOSIS — Z8249 Family history of ischemic heart disease and other diseases of the circulatory system: Secondary | ICD-10-CM

## 2021-09-05 HISTORY — DX: Atherosclerosis of aorta: I70.0

## 2021-09-05 HISTORY — DX: Long term (current) use of anticoagulants: Z79.01

## 2021-09-05 HISTORY — DX: Diaphragmatic hernia without obstruction or gangrene: K44.9

## 2021-09-05 HISTORY — DX: Scoliosis, unspecified: M41.9

## 2021-09-05 HISTORY — DX: Paroxysmal atrial fibrillation: I48.0

## 2021-09-05 HISTORY — DX: Barrett's esophagus without dysplasia: K22.70

## 2021-09-05 HISTORY — PX: INSERTION OF MESH: SHX5868

## 2021-09-05 HISTORY — DX: Other forms of dyspnea: R06.09

## 2021-09-05 HISTORY — DX: Localized edema: R60.0

## 2021-09-05 HISTORY — DX: Anemia, unspecified: D64.9

## 2021-09-05 HISTORY — DX: Edema, unspecified: R60.9

## 2021-09-05 HISTORY — DX: Supraventricular tachycardia: I47.1

## 2021-09-05 HISTORY — DX: Supraventricular tachycardia, unspecified: I47.10

## 2021-09-05 HISTORY — DX: Disorder of arteries and arterioles, unspecified: I77.9

## 2021-09-05 HISTORY — DX: Endocarditis, valve unspecified: I38

## 2021-09-05 HISTORY — DX: Chronic kidney disease, stage 3 unspecified: N18.30

## 2021-09-05 HISTORY — DX: Obstructive sleep apnea (adult) (pediatric): G47.33

## 2021-09-05 HISTORY — DX: Esophageal obstruction: K22.2

## 2021-09-05 HISTORY — DX: Benign neoplasm of colon, unspecified: D12.6

## 2021-09-05 HISTORY — PX: XI ROBOTIC ASSISTED PARAESOPHAGEAL HERNIA REPAIR: SHX6871

## 2021-09-05 LAB — CBC
HCT: 40.2 % (ref 36.0–46.0)
Hemoglobin: 12.7 g/dL (ref 12.0–15.0)
MCH: 33.2 pg (ref 26.0–34.0)
MCHC: 31.6 g/dL (ref 30.0–36.0)
MCV: 105 fL — ABNORMAL HIGH (ref 80.0–100.0)
Platelets: 142 10*3/uL — ABNORMAL LOW (ref 150–400)
RBC: 3.83 MIL/uL — ABNORMAL LOW (ref 3.87–5.11)
RDW: 13.3 % (ref 11.5–15.5)
WBC: 8.6 10*3/uL (ref 4.0–10.5)
nRBC: 0 % (ref 0.0–0.2)

## 2021-09-05 LAB — ABO/RH: ABO/RH(D): A NEG

## 2021-09-05 LAB — CREATININE, SERUM
Creatinine, Ser: 1.48 mg/dL — ABNORMAL HIGH (ref 0.44–1.00)
GFR, Estimated: 35 mL/min — ABNORMAL LOW (ref >60)

## 2021-09-05 SURGERY — REPAIR, HERNIA, PARAESOPHAGEAL, ROBOT-ASSISTED
Anesthesia: General

## 2021-09-05 MED ORDER — CELECOXIB 200 MG PO CAPS
ORAL_CAPSULE | ORAL | Status: AC
  Administered 2021-09-05: 200 mg via ORAL
  Filled 2021-09-05: qty 1

## 2021-09-05 MED ORDER — GABAPENTIN 300 MG PO CAPS: 300.0000 mg | ORAL_CAPSULE | ORAL | Status: AC

## 2021-09-05 MED ORDER — ONDANSETRON HCL 4 MG/2ML IJ SOLN: 4.0000 mg | Freq: Once | INTRAMUSCULAR | Status: DC | PRN

## 2021-09-05 MED ORDER — FENTANYL CITRATE (PF) 100 MCG/2ML IJ SOLN
INTRAMUSCULAR | Status: AC
  Filled 2021-09-05: qty 2

## 2021-09-05 MED ORDER — ACETAMINOPHEN 500 MG PO TABS
1000.0000 mg | ORAL_TABLET | Freq: Four times a day (QID) | ORAL | Status: DC
  Administered 2021-09-05 – 2021-09-07 (×7): 1000 mg via ORAL
  Filled 2021-09-05 (×7): qty 2

## 2021-09-05 MED ORDER — DIPHENHYDRAMINE HCL 12.5 MG/5ML PO ELIX: 12.5000 mg | ORAL_SOLUTION | Freq: Four times a day (QID) | ORAL | Status: DC | PRN

## 2021-09-05 MED ORDER — GABAPENTIN 300 MG PO CAPS
ORAL_CAPSULE | ORAL | Status: AC
  Administered 2021-09-05: 300 mg via ORAL
  Filled 2021-09-05: qty 1

## 2021-09-05 MED ORDER — VISTASEAL 10 ML SINGLE DOSE KIT
PACK | CUTANEOUS | Status: AC
  Filled 2021-09-05: qty 10

## 2021-09-05 MED ORDER — ONDANSETRON HCL 4 MG/2ML IJ SOLN
4.0000 mg | Freq: Four times a day (QID) | INTRAMUSCULAR | Status: DC | PRN
  Administered 2021-09-11: 4 mg via INTRAVENOUS
  Filled 2021-09-05: qty 2

## 2021-09-05 MED ORDER — GLYCOPYRROLATE 0.2 MG/ML IJ SOLN
INTRAMUSCULAR | Status: DC | PRN
  Administered 2021-09-05: .2 mg via INTRAVENOUS

## 2021-09-05 MED ORDER — SODIUM CHLORIDE 0.9 % IV SOLN: INTRAVENOUS | Status: DC

## 2021-09-05 MED ORDER — LIDOCAINE HCL (CARDIAC) PF 100 MG/5ML IV SOSY
PREFILLED_SYRINGE | INTRAVENOUS | Status: DC | PRN
  Administered 2021-09-05: 100 mg via INTRAVENOUS

## 2021-09-05 MED ORDER — PHENYLEPHRINE HCL-NACL 20-0.9 MG/250ML-% IV SOLN
INTRAVENOUS | Status: DC | PRN
  Administered 2021-09-05: 25 ug/min via INTRAVENOUS

## 2021-09-05 MED ORDER — SUCCINYLCHOLINE CHLORIDE 200 MG/10ML IV SOSY
PREFILLED_SYRINGE | INTRAVENOUS | Status: AC
  Filled 2021-09-05: qty 10

## 2021-09-05 MED ORDER — ORAL CARE MOUTH RINSE: 15.0000 mL | Freq: Once | OROMUCOSAL | Status: AC

## 2021-09-05 MED ORDER — KETOROLAC TROMETHAMINE 15 MG/ML IJ SOLN
15.0000 mg | Freq: Four times a day (QID) | INTRAMUSCULAR | Status: DC
  Administered 2021-09-05 – 2021-09-08 (×13): 15 mg via INTRAVENOUS
  Filled 2021-09-05 (×12): qty 1

## 2021-09-05 MED ORDER — LIDOCAINE HCL (PF) 2 % IJ SOLN
INTRAMUSCULAR | Status: AC
  Filled 2021-09-05: qty 5

## 2021-09-05 MED ORDER — ONDANSETRON HCL 4 MG/2ML IJ SOLN
INTRAMUSCULAR | Status: DC | PRN
  Administered 2021-09-05 (×2): 4 mg via INTRAVENOUS

## 2021-09-05 MED ORDER — VASOPRESSIN 20 UNIT/ML IV SOLN
INTRAVENOUS | Status: AC
  Filled 2021-09-05: qty 1

## 2021-09-05 MED ORDER — FENTANYL CITRATE (PF) 100 MCG/2ML IJ SOLN
INTRAMUSCULAR | Status: DC | PRN
  Administered 2021-09-05 (×4): 50 ug via INTRAVENOUS

## 2021-09-05 MED ORDER — CHLORHEXIDINE GLUCONATE CLOTH 2 % EX PADS: 6.0000 | MEDICATED_PAD | Freq: Once | CUTANEOUS | Status: DC

## 2021-09-05 MED ORDER — ROCURONIUM BROMIDE 10 MG/ML (PF) SYRINGE
PREFILLED_SYRINGE | INTRAVENOUS | Status: AC
  Filled 2021-09-05: qty 10

## 2021-09-05 MED ORDER — ONDANSETRON 4 MG PO TBDP: 4.0000 mg | ORAL_TABLET | Freq: Four times a day (QID) | ORAL | Status: DC | PRN

## 2021-09-05 MED ORDER — SEVOFLURANE IN SOLN
RESPIRATORY_TRACT | Status: AC
  Filled 2021-09-05: qty 250

## 2021-09-05 MED ORDER — TOBRAMYCIN 0.3 % OP SOLN: 1.0000 [drp] | Freq: Three times a day (TID) | OPHTHALMIC | Status: DC | PRN

## 2021-09-05 MED ORDER — METOPROLOL TARTRATE 50 MG PO TABS
75.0000 mg | ORAL_TABLET | Freq: Two times a day (BID) | ORAL | Status: DC
  Administered 2021-09-05 – 2021-09-07 (×4): 75 mg via ORAL
  Filled 2021-09-05 (×4): qty 2

## 2021-09-05 MED ORDER — PROPOFOL 1000 MG/100ML IV EMUL
INTRAVENOUS | Status: AC
  Filled 2021-09-05: qty 100

## 2021-09-05 MED ORDER — FENTANYL CITRATE (PF) 100 MCG/2ML IJ SOLN: 25.0000 ug | INTRAMUSCULAR | Status: DC | PRN

## 2021-09-05 MED ORDER — BUPIVACAINE-EPINEPHRINE (PF) 0.25% -1:200000 IJ SOLN
INTRAMUSCULAR | Status: AC
  Filled 2021-09-05: qty 30

## 2021-09-05 MED ORDER — CEFAZOLIN SODIUM-DEXTROSE 2-4 GM/100ML-% IV SOLN
2.0000 g | Freq: Three times a day (TID) | INTRAVENOUS | Status: AC
  Administered 2021-09-05 (×2): 2 g via INTRAVENOUS
  Filled 2021-09-05: qty 100

## 2021-09-05 MED ORDER — PHENYLEPHRINE 80 MCG/ML (10ML) SYRINGE FOR IV PUSH (FOR BLOOD PRESSURE SUPPORT)
PREFILLED_SYRINGE | INTRAVENOUS | Status: DC | PRN
  Administered 2021-09-05 (×3): 160 ug via INTRAVENOUS

## 2021-09-05 MED ORDER — ARFORMOTEROL TARTRATE 15 MCG/2ML IN NEBU
15.0000 ug | INHALATION_SOLUTION | Freq: Two times a day (BID) | RESPIRATORY_TRACT | Status: DC
  Administered 2021-09-05 – 2021-09-18 (×23): 15 ug via RESPIRATORY_TRACT
  Filled 2021-09-05 (×28): qty 2

## 2021-09-05 MED ORDER — COQ-10 100 MG PO CPCR: 100.0000 mg | ORAL_CAPSULE | Freq: Every day | ORAL | Status: DC

## 2021-09-05 MED ORDER — UMECLIDINIUM BROMIDE 62.5 MCG/ACT IN AEPB
1.0000 | INHALATION_SPRAY | Freq: Every day | RESPIRATORY_TRACT | Status: DC
  Administered 2021-09-06 – 2021-09-18 (×13): 1 via RESPIRATORY_TRACT
  Filled 2021-09-05 (×2): qty 7

## 2021-09-05 MED ORDER — CEFAZOLIN SODIUM-DEXTROSE 2-4 GM/100ML-% IV SOLN
2.0000 g | INTRAVENOUS | Status: AC
  Administered 2021-09-05: 2 g via INTRAVENOUS

## 2021-09-05 MED ORDER — SODIUM CHLORIDE 0.9 % IR SOLN
Status: DC | PRN
  Administered 2021-09-05: 1000 mL

## 2021-09-05 MED ORDER — OXYCODONE HCL 5 MG PO TABS
5.0000 mg | ORAL_TABLET | ORAL | Status: DC | PRN
  Administered 2021-09-16 – 2021-09-17 (×2): 5 mg via ORAL
  Filled 2021-09-05 (×3): qty 1

## 2021-09-05 MED ORDER — CELECOXIB 200 MG PO CAPS: 200.0000 mg | ORAL_CAPSULE | ORAL | Status: AC

## 2021-09-05 MED ORDER — ACETAMINOPHEN 500 MG PO TABS
ORAL_TABLET | ORAL | Status: AC
  Administered 2021-09-05: 1000 mg via ORAL
  Filled 2021-09-05: qty 2

## 2021-09-05 MED ORDER — BUPIVACAINE LIPOSOME 1.3 % IJ SUSP
INTRAMUSCULAR | Status: DC | PRN
  Administered 2021-09-05: 20 mL

## 2021-09-05 MED ORDER — GLYCOPYRROLATE 0.2 MG/ML IJ SOLN
INTRAMUSCULAR | Status: AC
  Filled 2021-09-05: qty 1

## 2021-09-05 MED ORDER — VASOPRESSIN 20 UNIT/ML IV SOLN
INTRAVENOUS | Status: DC | PRN
  Administered 2021-09-05 (×2): 2 [IU] via INTRAVENOUS

## 2021-09-05 MED ORDER — KETOROLAC TROMETHAMINE 15 MG/ML IJ SOLN
INTRAMUSCULAR | Status: AC
  Filled 2021-09-05: qty 1

## 2021-09-05 MED ORDER — CHLORHEXIDINE GLUCONATE 0.12 % MT SOLN: 15.0000 mL | Freq: Once | OROMUCOSAL | Status: AC

## 2021-09-05 MED ORDER — MELATONIN 5 MG PO TABS: 2.5000 mg | ORAL_TABLET | Freq: Every evening | ORAL | Status: DC | PRN

## 2021-09-05 MED ORDER — ONDANSETRON HCL 4 MG/2ML IJ SOLN
INTRAMUSCULAR | Status: AC
  Filled 2021-09-05: qty 2

## 2021-09-05 MED ORDER — BUPIVACAINE-EPINEPHRINE 0.25% -1:200000 IJ SOLN
INTRAMUSCULAR | Status: DC | PRN
  Administered 2021-09-05: 30 mL

## 2021-09-05 MED ORDER — CHLORHEXIDINE GLUCONATE 0.12 % MT SOLN
OROMUCOSAL | Status: AC
  Administered 2021-09-05: 15 mL via OROMUCOSAL
  Filled 2021-09-05: qty 15

## 2021-09-05 MED ORDER — PHENYLEPHRINE HCL-NACL 20-0.9 MG/250ML-% IV SOLN
INTRAVENOUS | Status: AC
  Filled 2021-09-05: qty 250

## 2021-09-05 MED ORDER — MORPHINE SULFATE (PF) 2 MG/ML IV SOLN
2.0000 mg | INTRAVENOUS | Status: DC | PRN
  Administered 2021-09-05 – 2021-09-13 (×3): 2 mg via INTRAVENOUS
  Filled 2021-09-05 (×3): qty 1

## 2021-09-05 MED ORDER — HEPARIN SODIUM (PORCINE) 5000 UNIT/ML IJ SOLN
5000.0000 [IU] | Freq: Three times a day (TID) | INTRAMUSCULAR | Status: DC
  Administered 2021-09-06 – 2021-09-08 (×7): 5000 [IU] via SUBCUTANEOUS
  Filled 2021-09-05 (×7): qty 1

## 2021-09-05 MED ORDER — ROCURONIUM BROMIDE 100 MG/10ML IV SOLN
INTRAVENOUS | Status: DC | PRN
  Administered 2021-09-05: 10 mg via INTRAVENOUS
  Administered 2021-09-05 (×2): 20 mg via INTRAVENOUS
  Administered 2021-09-05: 40 mg via INTRAVENOUS

## 2021-09-05 MED ORDER — SUCCINYLCHOLINE CHLORIDE 200 MG/10ML IV SOSY
PREFILLED_SYRINGE | INTRAVENOUS | Status: DC | PRN
  Administered 2021-09-05: 100 mg via INTRAVENOUS

## 2021-09-05 MED ORDER — PHENYLEPHRINE 80 MCG/ML (10ML) SYRINGE FOR IV PUSH (FOR BLOOD PRESSURE SUPPORT)
PREFILLED_SYRINGE | INTRAVENOUS | Status: AC
  Filled 2021-09-05: qty 20

## 2021-09-05 MED ORDER — CEFAZOLIN SODIUM-DEXTROSE 2-4 GM/100ML-% IV SOLN
INTRAVENOUS | Status: AC
  Filled 2021-09-05: qty 100

## 2021-09-05 MED ORDER — MOMETASONE FURO-FORMOTEROL FUM 200-5 MCG/ACT IN AERO
2.0000 | INHALATION_SPRAY | Freq: Two times a day (BID) | RESPIRATORY_TRACT | Status: DC
  Administered 2021-09-05 – 2021-09-18 (×25): 2 via RESPIRATORY_TRACT
  Filled 2021-09-05: qty 8.8

## 2021-09-05 MED ORDER — CHLORHEXIDINE GLUCONATE CLOTH 2 % EX PADS
6.0000 | MEDICATED_PAD | Freq: Once | CUTANEOUS | Status: AC
  Administered 2021-09-05: 6 via TOPICAL

## 2021-09-05 MED ORDER — LACTATED RINGERS IV SOLN: INTRAVENOUS | Status: DC

## 2021-09-05 MED ORDER — ACETAMINOPHEN 500 MG PO TABS: 1000.0000 mg | ORAL_TABLET | ORAL | Status: AC

## 2021-09-05 MED ORDER — DEXAMETHASONE SODIUM PHOSPHATE 10 MG/ML IJ SOLN
INTRAMUSCULAR | Status: AC
  Filled 2021-09-05: qty 1

## 2021-09-05 MED ORDER — VISTASEAL 10 ML SINGLE DOSE KIT
PACK | CUTANEOUS | Status: DC | PRN
  Administered 2021-09-05: 10 mL via TOPICAL

## 2021-09-05 MED ORDER — DIPHENHYDRAMINE HCL 50 MG/ML IJ SOLN: 12.5000 mg | Freq: Four times a day (QID) | INTRAMUSCULAR | Status: DC | PRN

## 2021-09-05 MED ORDER — STERILE WATER FOR IRRIGATION IR SOLN
Status: DC | PRN
  Administered 2021-09-05: 1000 mL

## 2021-09-05 MED ORDER — SUGAMMADEX SODIUM 200 MG/2ML IV SOLN
INTRAVENOUS | Status: DC | PRN
  Administered 2021-09-05: 300 mg via INTRAVENOUS

## 2021-09-05 MED ORDER — PROPOFOL 10 MG/ML IV BOLUS
INTRAVENOUS | Status: DC | PRN
  Administered 2021-09-05: 100 mg via INTRAVENOUS

## 2021-09-05 MED ORDER — ALBUTEROL SULFATE (2.5 MG/3ML) 0.083% IN NEBU: 3.0000 mL | INHALATION_SOLUTION | Freq: Four times a day (QID) | RESPIRATORY_TRACT | Status: DC | PRN

## 2021-09-05 MED ORDER — BUPIVACAINE LIPOSOME 1.3 % IJ SUSP
INTRAMUSCULAR | Status: AC
  Filled 2021-09-05: qty 20

## 2021-09-05 MED ORDER — DEXAMETHASONE SODIUM PHOSPHATE 10 MG/ML IJ SOLN
INTRAMUSCULAR | Status: DC | PRN
  Administered 2021-09-05: 10 mg via INTRAVENOUS

## 2021-09-05 SURGICAL SUPPLY — 60 items
APPLICATOR VISTASEAL 35 (MISCELLANEOUS) ×1 IMPLANT
BLADE SURG 15 STRL LF DISP TIS (BLADE) ×2 IMPLANT
BLADE SURG 15 STRL SS (BLADE) ×3
CANNULA REDUC XI 12-8 STAPL (CANNULA) ×3
CANNULA REDUCER 12-8 DVNC XI (CANNULA) ×2 IMPLANT
DERMABOND ADVANCED (GAUZE/BANDAGES/DRESSINGS) ×3
DERMABOND ADVANCED .7 DNX12 (GAUZE/BANDAGES/DRESSINGS) ×2 IMPLANT
DRAPE 3/4 80X56 (DRAPES) ×2 IMPLANT
DRAPE ARM DVNC X/XI (DISPOSABLE) ×8 IMPLANT
DRAPE COLUMN DVNC XI (DISPOSABLE) ×2 IMPLANT
DRAPE DA VINCI XI ARM (DISPOSABLE) ×12
DRAPE DA VINCI XI COLUMN (DISPOSABLE) ×3
ELECT CAUTERY BLADE 6.4 (BLADE) ×3 IMPLANT
ELECT REM PT RETURN 9FT ADLT (ELECTROSURGICAL) ×3
ELECTRODE REM PT RTRN 9FT ADLT (ELECTROSURGICAL) ×2 IMPLANT
GLOVE BIO SURGEON STRL SZ7 (GLOVE) ×14 IMPLANT
GOWN STRL REUS W/ TWL LRG LVL3 (GOWN DISPOSABLE) ×8 IMPLANT
GOWN STRL REUS W/TWL LRG LVL3 (GOWN DISPOSABLE) ×12
GRASPER LAPSCPC 5X45 DSP (INSTRUMENTS) ×3 IMPLANT
IRRIGATION STRYKERFLOW (MISCELLANEOUS) IMPLANT
IRRIGATOR STRYKERFLOW (MISCELLANEOUS) ×3
IV NS 1000ML (IV SOLUTION) ×3
IV NS 1000ML BAXH (IV SOLUTION) IMPLANT
KIT PINK PAD W/HEAD ARE REST (MISCELLANEOUS) ×3
KIT PINK PAD W/HEAD ARM REST (MISCELLANEOUS) ×2 IMPLANT
KIT TURNOVER CYSTO (KITS) ×3 IMPLANT
LABEL OR SOLS (LABEL) ×3 IMPLANT
MANIFOLD NEPTUNE II (INSTRUMENTS) ×3 IMPLANT
MESH BIO-A 7X10 SYN MAT (Mesh General) ×1 IMPLANT
NEEDLE HYPO 22GX1.5 SAFETY (NEEDLE) ×3 IMPLANT
OBTURATOR OPTICAL STANDARD 8MM (TROCAR) ×3
OBTURATOR OPTICAL STND 8 DVNC (TROCAR) ×2
OBTURATOR OPTICALSTD 8 DVNC (TROCAR) ×2 IMPLANT
PACK LAP CHOLECYSTECTOMY (MISCELLANEOUS) ×3 IMPLANT
SEAL CANN UNIV 5-8 DVNC XI (MISCELLANEOUS) ×6 IMPLANT
SEAL XI 5MM-8MM UNIVERSAL (MISCELLANEOUS) ×9
SEALER VESSEL DA VINCI XI (MISCELLANEOUS) ×3
SEALER VESSEL EXT DVNC XI (MISCELLANEOUS) ×2 IMPLANT
SOLUTION ELECTROLUBE (MISCELLANEOUS) ×3 IMPLANT
SPIKE FLUID TRANSFER (MISCELLANEOUS) ×3 IMPLANT
SPONGE T-LAP 18X18 ~~LOC~~+RFID (SPONGE) ×3 IMPLANT
SPONGE T-LAP 4X18 ~~LOC~~+RFID (SPONGE) ×1 IMPLANT
STAPLER CANNULA SEAL DVNC XI (STAPLE) ×2 IMPLANT
STAPLER CANNULA SEAL XI (STAPLE) ×3
SUT MNCRL 4-0 (SUTURE) ×6
SUT MNCRL 4-0 27XMFL (SUTURE) ×4
SUT SILK 2 0 SH (SUTURE) ×9 IMPLANT
SUT VIC AB 3-0 SH 27 (SUTURE)
SUT VIC AB 3-0 SH 27X BRD (SUTURE) IMPLANT
SUT VICRYL 0 AB UR-6 (SUTURE) ×6 IMPLANT
SUT VLOC 90 S/L VL9 GS22 (SUTURE) ×3 IMPLANT
SUTURE MNCRL 4-0 27XMF (SUTURE) ×2 IMPLANT
SYR 20ML LL LF (SYRINGE) ×3 IMPLANT
SYR 30ML LL (SYRINGE) ×3 IMPLANT
SYS BAG RETRIEVAL 10MM (BASKET) ×3
SYSTEM BAG RETRIEVAL 10MM (BASKET) IMPLANT
TRAY FOLEY SLVR 16FR LF STAT (SET/KITS/TRAYS/PACK) ×3 IMPLANT
TROCAR XCEL NON-BLD 5MMX100MML (ENDOMECHANICALS) ×3 IMPLANT
TUBING EVAC SMOKE HEATED PNEUM (TUBING) ×3 IMPLANT
WATER STERILE IRR 500ML POUR (IV SOLUTION) ×2 IMPLANT

## 2021-09-05 NOTE — Interval H&P Note (Signed)
History and Physical Interval Note:  09/05/2021 7:16 AM  Christina [name]  has presented today for surgery, with the diagnosis of Paraesophageal hernia.  The various methods of treatment have been discussed with the patient and family. After consideration of risks, benefits and other options for treatment, the patient has consented to  Procedure(s): XI ROBOTIC ASSISTED PARAESOPHAGEAL HERNIA REPAIR, RNFA to assist (N/A) as a surgical intervention.  The patient's history has been reviewed, patient examined, no change in status, stable for surgery.  I have reviewed the patient's chart and labs.  Questions were answered to the patient's satisfaction.     [place]

## 2021-09-05 NOTE — Op Note (Signed)
Robotic assisted laparoscopic  repair of  Paraesophageal hiatal hernia with Bio-A Mesh 10x 7 cms and Nissen fundoplication   Pre-operative Diagnosis: GERD,paraesophageal hernia  Post-operative Diagnosis: same  Procedure:  Surgeon: Caroleen Hamman, MD FACS  Assistant: Gladstone Lighter RNFA  Required due to the complexity of the case the need for exposure and lack of first assist.  Anesthesia: Gen. with endotracheal tube  Findings: Giant Paraesophageal hernia type III with at least 2/3 of the stomach within the mediastinum Very large and redundant Sac making dissection and exposure difficult Chronically organoaxial volvulized stomach Loose wrap 360 degree over 50 FR Bougie   Estimated Blood Loss: 25cc       Specimens: Sac         Complications: none   Procedure Details  The patient was seen again in the Holding Room. The benefits, complications, treatment options, and expected outcomes were discussed with the patient. The risks of bleeding, infection, recurrence of symptoms, failure to resolve symptoms,  esophageal damage, Dysphagia, bowel injury, any of which could require further surgery were reviewed with the patient. The likelihood of improving the patient's symptoms with return to their baseline status is good.  The patient and/or family concurred with the proposed plan, giving informed consent.  The patient was taken to Operating Room, identified  and the procedure verified.  A Time Out was held and the above information confirmed.  Prior to the induction of general anesthesia, antibiotic prophylaxis was administered. VTE prophylaxis was in place. General endotracheal anesthesia was then administered and tolerated well. After the induction, the abdomen was prepped with Chloraprep and draped in the sterile fashion. The patient was positioned in the supine position.  Cut down technique was used to enter the abdominal cavity and a Hasson trochar was placed after two vicryl stitches were  anchored to the fascia. Pneumoperitoneum was then created with CO2 and tolerated well without any adverse changes in the patient's vital signs.  Three 8-mm ports were placed under direct vision. All skin incisions  were infiltrated with a local anesthetic agent before making the incision and placing the trocars. An additional 5 mm regular laparoscopic port was placed to assist with retraction and exposure.   The patient was positioned  in reverse Trendelenburg, robot was brought to the surgical field and docked in the standard fashion.  We made sure all the instrumentation was kept indirect view at all times and that there were no collision between the arms. I scrubbed out and went to the console.  I used a robotic arm to retract the liver, the vessel sealer on my right hand and a forced bipolar grasper on my left hand.  There is along the extra 5 mm port allow me ample exposure and the ability to perform meticulous dissection  We Started dividing the lesser omentum via the pars flaccida.  We Were able to dissect the lesser curvature of the stomach and  dissected the fundus free from the right and left crus. This was  a giant paraesophageal w volvulized stomach chronically. THis posed significant challenges when dissecting structures. We took our time and performed meticulous dissection We circumferentially dissected the GE junction.  The hernia sac was also completely reduced and we were able to bring the stomach into the intra-abdominal position.  Attention then was turned to the greater curvature where the short gastrics were divided with sealer device.  We were able to identify the left crus and again were able to make sure there was a good  circumferential dissection and that the hernia sac was completely excised.   We did perform a good dissection within the mediastinum to allow a complete reduction of the sac and a to completely allow an intra-abdominal Nissen fundoplication. THe crus was  approximated using two strips of BIO-A as pledgets and we used a 2-0 vlock to approximate the crus.  We Asked anesthesia to place a 50 French bougie and this went easily.  We also observe trajectory of the bougie. 360 degree Nissen fundoplication was created with multiple 2-0 silk sutures and we placed 3 stitches taking some of the esophagus within that bite.  The fundoplication measured approximately 3-1/2 cm and he was floppy. I was very happy with the way the fundoplication laid and the repair of the hernia. Hernia sac was removed.  Inspection of the  upper quadrant was performed. No bleeding, bile  Or esophageal injuries leaks, or bowel injuries were noted. Robotic instruments and robotic arms were undocked in the standard fashion. All the needles were removed under direct visualization.   I scrubbed back in.  Pneumoperitoneum was released.  The periumbilical port site was closed with interrumpted 0 Vicryl sutures. 4-0 subcuticular Monocryl was used to close the skin. Liposomal marcaine was injected to all the incisions sites.  Dermabond was  applied.  The patient was then extubated and brought to the recovery room in stable condition. Sponge, lap, and needle counts were correct at closure and at the conclusion of the case.               Caroleen Hamman, MD, FACS

## 2021-09-05 NOTE — Anesthesia Procedure Notes (Signed)

## 2021-09-05 NOTE — Anesthesia Postprocedure Evaluation (Signed)
Anesthesia Post Note  Patient: Christina [name]  Procedure(s) Performed: XI ROBOTIC ASSISTED PARAESOPHAGEAL HERNIA REPAIR, RNFA to assist INSERTION OF MESH  Patient location during evaluation: PACU Anesthesia Type: General Level of consciousness: awake and alert Pain management: pain level controlled Vital Signs Assessment: post-procedure vital signs reviewed and stable Respiratory status: spontaneous breathing, nonlabored ventilation, respiratory function stable and patient connected to nasal cannula oxygen Cardiovascular status: blood pressure returned to baseline and stable Postop Assessment: no apparent nausea or vomiting Anesthetic complications: no   No notable events documented.   Last Vitals:  Vitals:   09/05/21 1345 09/05/21 1400  BP: 129/68   Pulse: 73   Resp: 11   Temp:  36.6 C  SpO2: 98%     Last Pain:  Vitals:   09/05/21 1400  TempSrc:   PainSc: [place]

## 2021-09-05 NOTE — Transfer of Care (Signed)
Immediate Anesthesia Transfer of Care Note  Patient: Christina [name]  Procedure(s) Performed: XI ROBOTIC ASSISTED PARAESOPHAGEAL HERNIA REPAIR, RNFA to assist INSERTION OF MESH  Patient Location: PACU  Anesthesia Type:General  Level of Consciousness: drowsy  Airway & Oxygen Therapy: Patient Spontanous Breathing and Patient connected to face mask oxygen  Post-op Assessment: Report given to RN and Post -op Vital signs reviewed and stable  Post vital signs: Reviewed and stable  Last Vitals:  Vitals Value Taken Time  BP 164/71 09/05/21 1154  Temp 37.1 C 09/05/21 1154  Pulse 81 09/05/21 1200  Resp 12 09/05/21 1200  SpO2 92 % 09/05/21 1200  Vitals shown include unvalidated device data.  Last Pain:  Vitals:   09/05/21 0646  TempSrc: Temporal  PainSc: 0-No pain         Complications: No notable events documented.

## 2021-09-05 NOTE — Anesthesia Preprocedure Evaluation (Signed)
Anesthesia Evaluation  Patient identified by MRN, date of birth, ID band Patient awake    Reviewed: Allergy & Precautions, NPO status , Patient's Chart, lab work & pertinent test results  History of Anesthesia Complications Negative for: history of anesthetic complications  Airway Mallampati: II   Neck ROM: Full    Dental  (+) Partial Upper, Partial Lower, Dental Advidsory Given, Poor Dentition   Pulmonary shortness of breath and with exertion, sleep apnea , COPD, neg recent URI, former smoker,    Pulmonary exam normal breath sounds clear to auscultation       Cardiovascular hypertension, (-) angina+ CAD (s/p CABG), + CABG and + Peripheral Vascular Disease  (-) Past MI and (-) Cardiac Stents + dysrhythmias (a fib on Eliquis) Atrial Fibrillation + Valvular Problems/Murmurs (severe TR)  Rhythm:Regular Rate:Normal  ECG 04/09/21: A fib, long QT  Echocardiogram 2D complete 04/10/21: 1. Left ventricular ejection fraction, by estimation, is 50 to 55%. The left ventricle has low normal function. The left ventricle has no regional wall motion abnormalities. There is mild left ventricular hypertrophy. Left ventricular diastolic parameters are indeterminate.  2. Right ventricular systolic function is mildly reduced. The right ventricular size is mildly enlarged.  3. Left atrial size was moderately dilated.  4. Right atrial size was moderately dilated.  5. The mitral valve is normal in structure. Mild to moderate mitral valve regurgitation. No evidence of mitral stenosis.  6. Tricuspid valve regurgitationis severe.  7. The aortic valve is normal in structure. Aortic valve regurgitation is not visualized. No aortic stenosis is present.   Myocardial Perfusion 09/30/15: Borderline inferior defect no evidence of redistribution normal left ventricular function at 55% normal size of the ventricular cavity inferior wall may be artifact this is  probably a normal scan but consider further evaluation the patient develops or continues to have symptoms   Neuro/Psych negative neurological ROS     GI/Hepatic Neg liver ROS, hiatal hernia, GERD  ,  Endo/Other  negative endocrine ROS  Renal/GU CRFRenal disease     Musculoskeletal  (+) Arthritis ,   Abdominal   Peds  Hematology negative hematology ROS (+)   Anesthesia Other Findings Cardiology note 05/29/21:  1 angina unclear etiology recommend conservative medical therapy metoprolol HCTZ 2 atrial fibrillation paroxysmal continue Eliquis for anticoagulation metoprolol for rate advised patient to stop Eliquis before surgery 3 COPD by history recommend inhalers as necessary advised patient refrain from tobacco abuse 4 obesity modest recommend modest weight loss exercise portion control 5 hypertension reasonably managed continue HCTZ metoprolol 6 GERD patient currently on Protonix therapy for reflux type symptoms 7 hyperlipidemia continue Crestor therapy for lipid management 8 edema recommend support stockings elevation continue HCTZ will recommend increasing at future visit if needed 9 patient follow-up 6 months   Reproductive/Obstetrics                             Anesthesia Physical  Anesthesia Plan  ASA: 3  Anesthesia Plan: General   Post-op Pain Management:    Induction: Intravenous  PONV Risk Score and Plan: 3 and Treatment may vary due to age or medical condition, Ondansetron and Dexamethasone  Airway Management Planned: Oral ETT  Additional Equipment:   Intra-op Plan:   Post-operative Plan: Extubation in OR  Informed Consent: I have reviewed the patients History and Physical, chart, labs and discussed the procedure including the risks, benefits and alternatives for the proposed anesthesia with the patient or authorized  representative who has indicated his/her understanding and acceptance.       Plan Discussed with:  CRNA  Anesthesia Plan Comments: (LMA/GETA backup discussed.  Patient consented for risks of anesthesia including but not limited to:  - adverse reactions to medications - damage to eyes, teeth, lips or other oral mucosa - nerve damage due to positioning  - sore throat or hoarseness - damage to heart, brain, nerves, lungs, other parts of body or loss of life  Informed patient about role of CRNA in peri- and intra-operative care.  Patient voiced understanding.)        Anesthesia Quick Evaluation

## 2021-09-06 ENCOUNTER — Observation Stay: Payer: Medicare Other

## 2021-09-06 ENCOUNTER — Encounter: Payer: Self-pay | Admitting: Surgery

## 2021-09-06 DIAGNOSIS — K449 Diaphragmatic hernia without obstruction or gangrene: Secondary | ICD-10-CM | POA: Diagnosis not present

## 2021-09-06 DIAGNOSIS — K2289 Other specified disease of esophagus: Secondary | ICD-10-CM | POA: Diagnosis not present

## 2021-09-06 LAB — BASIC METABOLIC PANEL
Anion gap: 6 (ref 5–15)
BUN: 29 mg/dL — ABNORMAL HIGH (ref 8–23)
CO2: 25 mmol/L (ref 22–32)
Calcium: 8 mg/dL — ABNORMAL LOW (ref 8.9–10.3)
Chloride: 108 mmol/L (ref 98–111)
Creatinine, Ser: 1.5 mg/dL — ABNORMAL HIGH (ref 0.44–1.00)
GFR, Estimated: 34 mL/min — ABNORMAL LOW (ref >60)
Glucose, Bld: 130 mg/dL — ABNORMAL HIGH (ref 70–99)
Potassium: 4.6 mmol/L (ref 3.5–5.1)
Sodium: 139 mmol/L (ref 135–145)

## 2021-09-06 LAB — CBC
HCT: 38.4 % (ref 36.0–46.0)
Hemoglobin: 11.9 g/dL — ABNORMAL LOW (ref 12.0–15.0)
MCH: 32.8 pg (ref 26.0–34.0)
MCHC: 31 g/dL (ref 30.0–36.0)
MCV: 105.8 fL — ABNORMAL HIGH (ref 80.0–100.0)
Platelets: 146 10*3/uL — ABNORMAL LOW (ref 150–400)
RBC: 3.63 MIL/uL — ABNORMAL LOW (ref 3.87–5.11)
RDW: 13.7 % (ref 11.5–15.5)
WBC: 7 10*3/uL (ref 4.0–10.5)
nRBC: 0 % (ref 0.0–0.2)

## 2021-09-06 LAB — SURGICAL PATHOLOGY

## 2021-09-06 MED ORDER — ADULT MULTIVITAMIN LIQUID CH
15.0000 mL | Freq: Every day | ORAL | Status: DC
  Administered 2021-09-07: 15 mL
  Filled 2021-09-06: qty 15

## 2021-09-06 MED ORDER — ALBUMIN HUMAN 25 % IV SOLN
12.5000 g | Freq: Once | INTRAVENOUS | Status: AC
  Administered 2021-09-06: 12.5 g via INTRAVENOUS
  Filled 2021-09-06: qty 50

## 2021-09-06 MED ORDER — IOHEXOL 300 MG/ML  SOLN
150.0000 mL | Freq: Once | INTRAMUSCULAR | Status: AC | PRN
  Administered 2021-09-06: 150 mL via ORAL

## 2021-09-06 MED ORDER — ENSURE ENLIVE PO LIQD
237.0000 mL | Freq: Three times a day (TID) | ORAL | Status: DC
  Administered 2021-09-06 – 2021-09-07 (×3): 237 mL via ORAL

## 2021-09-06 NOTE — Care Management Obs Status (Signed)
[place] NOTIFICATION   Patient Details  Name: Christina [name] MRN: [id number] Date of Birth: 1935-11-24   Medicare Observation Status Notification Given:  Yes    Candie Chroman, LCSW 09/06/2021, 4:11 PM

## 2021-09-06 NOTE — Discharge Instructions (Addendum)
In addition to included general post-operative instructions,  Diet: Recommend following Nissen diet recommendations x4 weeks minimum. Handout given.    Activity: No heavy lifting >20 pounds (children, pets, laundry, garbage) for 4 weeks, but light activity and walking are encouraged. Do not drive or drink alcohol if taking narcotic pain medications or having pain that might distract from driving.  Wound care: Keep dressing on right chest wall for minimum 2 days before removing, you may shower/get incision wet with soapy water and pat dry (do not rub incisions), but no baths or submerging incision underwater until follow-up.   Medications: Resume Eliquis tomorrow 08/15. Resume all home medications. For mild to moderate pain: acetaminophen (Tylenol) or ibuprofen/naproxen (if no kidney disease). Combining Tylenol with alcohol can substantially increase your risk of causing liver disease. Narcotic pain medications, if prescribed, can be used for severe pain, though may cause nausea, constipation, and drowsiness. Do not combine Tylenol and Percocet (or similar) within a 6 hour period as Percocet (and similar) contain(s) Tylenol. If you do not need the narcotic pain medication, you do not need to fill the prescription.  Call office [phone] / [phone]) at any time if any questions, worsening pain, fevers/chills, bleeding, drainage from incision site, or other concerns.

## 2021-09-06 NOTE — Progress Notes (Addendum)
[place] [facility] Day(s): 0.   Post op day(s): 1 Day Post-Op.   Interval History:  Patient seen and examined No acute events or new complaints overnight.  Patient reports she is sore this morning, upper abdominal, lower chest No fever, chills, nausea, emesis She denied any trouble swallowing She remains without leukocytosis; WBC 7.0K Hgb to 11.9; dilutional Renal function is slightly above her baseline; sCr - 1.50; UO - unmeasured No significant electrolyte derangements She tolerated CLD; advance to FLD this AM   Vital signs in last 24 hours: [min-max] current  Temp:  [97.6 F (36.4 C)-98.7 F (37.1 C)] 97.9 F (36.6 C) (08/02 0817) Pulse Rate:  [69-98] 82 (08/02 0817) Resp:  [10-20] 18 (08/02 0817) BP: (102-164)/(50-83) 102/65 (08/02 0817) SpO2:  [88 %-99 %] 92 % (08/02 0817)     Height: '5\' 6"'$  (167.6 cm) Weight: 76.2 kg BMI (Calculated): 27.13   Intake/Output last 2 shifts:  08/01 0701 - 08/02 0700 In: 2312.9 [P.O.:240; I.V.:1772.9; IV Piggyback:300] Out: 20 [Blood:20]   Physical Exam:  Constitutional: alert, cooperative and no distress  Respiratory: breathing non-labored at rest  Cardiovascular: regular rate and sinus rhythm  Gastrointestinal: Soft, incisional soreness, non-distended, no rebound/guarding.  Integumentary: Laparoscopic incisions are CDI with dermabond, no erythema or drainage, some ecchymosis   Labs:     Latest Ref Rng & Units 09/06/2021    4:56 AM 09/05/2021    5:05 PM 08/29/2021   11:01 AM  CBC  WBC 4.0 - 10.5 K/uL 7.0  8.6  6.4   Hemoglobin 12.0 - 15.0 g/dL 11.9  12.7  13.1   Hematocrit 36.0 - 46.0 % 38.4  40.2  40.8   Platelets 150 - 400 K/uL 146  142  166       Latest Ref Rng & Units 09/06/2021    4:56 AM 09/05/2021    5:05 PM 08/29/2021   11:01 AM  CMP  Glucose 70 - 99 mg/dL 130   87   BUN 8 - 23 mg/dL 29   23   Creatinine 0.44 - 1.00 mg/dL 1.50  1.48  1.14   Sodium 135 - 145 mmol/L 139   142    Potassium 3.5 - 5.1 mmol/L 4.6   4.2   Chloride 98 - 111 mmol/L 108   106   CO2 22 - 32 mmol/L 25   27   Calcium 8.9 - 10.3 mg/dL 8.0   9.4      Imaging studies: No new pertinent imaging studies   Assessment/Plan: 86 y.o. female 1 Day Post-Op s/p robotic assisted laparoscopic paraesophageal hernia repair with Nissen fundoplication.   - Okay to continue FLD; reviewed Nissen diet recommendation and provided handout   - Will get UGI for reassurance  - Continue gentle IVF resuscitation; 50 ml/hr   - Monitor abdominal examination    - Pain control prn; antiemetics prn  - Home medications; start Eliquis on 08/04 - Mobilization as tolerated; can get PT if needed  - Discharge Planning; given age, comorbidities, and extensive nature of the procedure, we will keep her 24 hours. Will plan on DC home tomorrow morning if doing well.    All of the above findings and recommendations were discussed with the patient, patient's family (husband at bedside), and the medical team, and all of patient's and family's questions were answered to their expressed satisfaction.  -- Edison Simon, PA-C [facility] 09/06/2021, 9:09 AM M-F: 7am - 4pm

## 2021-09-06 NOTE — Progress Notes (Signed)
Mobility Specialist - Progress Note     09/06/21 1239  Mobility  Activity Ambulated with assistance in hallway;Ambulated with assistance to bathroom  Level of Assistance Minimal assist, patient does 75% or more  Assistive Device Front wheel walker  Distance Ambulated (ft) 190 ft  Activity Response Tolerated well  $Mobility charge 1 Mobility   Pt supine upon entry. Pt utilizing RA. Pt transferred to EOB and stood up with min assist. Pt ambulated one lap around NS. Pt ambulated to and from bathroom with min assist. Pt left supine with alarm set and needs in reach. Husband present at bedside. No complaints.  Candie Mile Mobility Specialist 09/06/21 12:46 PM

## 2021-09-07 ENCOUNTER — Observation Stay: Payer: Medicare Other

## 2021-09-07 ENCOUNTER — Encounter: Payer: Self-pay | Admitting: Surgery

## 2021-09-07 DIAGNOSIS — I480 Paroxysmal atrial fibrillation: Secondary | ICD-10-CM | POA: Diagnosis not present

## 2021-09-07 DIAGNOSIS — Z951 Presence of aortocoronary bypass graft: Secondary | ICD-10-CM | POA: Diagnosis not present

## 2021-09-07 DIAGNOSIS — J449 Chronic obstructive pulmonary disease, unspecified: Secondary | ICD-10-CM

## 2021-09-07 DIAGNOSIS — D696 Thrombocytopenia, unspecified: Secondary | ICD-10-CM | POA: Diagnosis not present

## 2021-09-07 DIAGNOSIS — I959 Hypotension, unspecified: Secondary | ICD-10-CM | POA: Diagnosis not present

## 2021-09-07 DIAGNOSIS — I2510 Atherosclerotic heart disease of native coronary artery without angina pectoris: Secondary | ICD-10-CM | POA: Diagnosis not present

## 2021-09-07 DIAGNOSIS — J939 Pneumothorax, unspecified: Secondary | ICD-10-CM | POA: Diagnosis not present

## 2021-09-07 DIAGNOSIS — R509 Fever, unspecified: Secondary | ICD-10-CM

## 2021-09-07 DIAGNOSIS — J9601 Acute respiratory failure with hypoxia: Secondary | ICD-10-CM | POA: Diagnosis present

## 2021-09-07 DIAGNOSIS — R531 Weakness: Secondary | ICD-10-CM | POA: Diagnosis not present

## 2021-09-07 DIAGNOSIS — Z885 Allergy status to narcotic agent status: Secondary | ICD-10-CM | POA: Diagnosis not present

## 2021-09-07 DIAGNOSIS — R0602 Shortness of breath: Secondary | ICD-10-CM | POA: Diagnosis not present

## 2021-09-07 DIAGNOSIS — J90 Pleural effusion, not elsewhere classified: Secondary | ICD-10-CM | POA: Diagnosis not present

## 2021-09-07 DIAGNOSIS — I739 Peripheral vascular disease, unspecified: Secondary | ICD-10-CM | POA: Diagnosis present

## 2021-09-07 DIAGNOSIS — I517 Cardiomegaly: Secondary | ICD-10-CM | POA: Diagnosis not present

## 2021-09-07 DIAGNOSIS — I10 Essential (primary) hypertension: Secondary | ICD-10-CM | POA: Diagnosis not present

## 2021-09-07 DIAGNOSIS — Z91040 Latex allergy status: Secondary | ICD-10-CM | POA: Diagnosis not present

## 2021-09-07 DIAGNOSIS — I898 Other specified noninfective disorders of lymphatic vessels and lymph nodes: Secondary | ICD-10-CM | POA: Diagnosis not present

## 2021-09-07 DIAGNOSIS — E785 Hyperlipidemia, unspecified: Secondary | ICD-10-CM | POA: Diagnosis present

## 2021-09-07 DIAGNOSIS — D72819 Decreased white blood cell count, unspecified: Secondary | ICD-10-CM | POA: Diagnosis not present

## 2021-09-07 DIAGNOSIS — I700 Atherosclerosis of aorta: Secondary | ICD-10-CM | POA: Diagnosis present

## 2021-09-07 DIAGNOSIS — G4733 Obstructive sleep apnea (adult) (pediatric): Secondary | ICD-10-CM | POA: Diagnosis present

## 2021-09-07 DIAGNOSIS — M419 Scoliosis, unspecified: Secondary | ICD-10-CM | POA: Diagnosis not present

## 2021-09-07 DIAGNOSIS — B3781 Candidal esophagitis: Secondary | ICD-10-CM | POA: Diagnosis present

## 2021-09-07 DIAGNOSIS — I129 Hypertensive chronic kidney disease with stage 1 through stage 4 chronic kidney disease, or unspecified chronic kidney disease: Secondary | ICD-10-CM | POA: Diagnosis present

## 2021-09-07 DIAGNOSIS — K6389 Other specified diseases of intestine: Secondary | ICD-10-CM | POA: Diagnosis not present

## 2021-09-07 DIAGNOSIS — H3530 Unspecified macular degeneration: Secondary | ICD-10-CM | POA: Diagnosis present

## 2021-09-07 DIAGNOSIS — M2578 Osteophyte, vertebrae: Secondary | ICD-10-CM | POA: Diagnosis not present

## 2021-09-07 DIAGNOSIS — Z888 Allergy status to other drugs, medicaments and biological substances status: Secondary | ICD-10-CM | POA: Diagnosis not present

## 2021-09-07 DIAGNOSIS — Z9889 Other specified postprocedural states: Secondary | ICD-10-CM | POA: Diagnosis not present

## 2021-09-07 DIAGNOSIS — K449 Diaphragmatic hernia without obstruction or gangrene: Secondary | ICD-10-CM | POA: Diagnosis not present

## 2021-09-07 DIAGNOSIS — Z882 Allergy status to sulfonamides status: Secondary | ICD-10-CM | POA: Diagnosis not present

## 2021-09-07 DIAGNOSIS — K219 Gastro-esophageal reflux disease without esophagitis: Secondary | ICD-10-CM | POA: Diagnosis present

## 2021-09-07 DIAGNOSIS — Z8719 Personal history of other diseases of the digestive system: Secondary | ICD-10-CM | POA: Diagnosis not present

## 2021-09-07 DIAGNOSIS — N1831 Chronic kidney disease, stage 3a: Secondary | ICD-10-CM | POA: Diagnosis not present

## 2021-09-07 DIAGNOSIS — J9811 Atelectasis: Secondary | ICD-10-CM | POA: Diagnosis not present

## 2021-09-07 LAB — RESPIRATORY PANEL BY PCR

## 2021-09-07 LAB — CBC
HCT: 34.6 % — ABNORMAL LOW (ref 36.0–46.0)
Hemoglobin: 11.1 g/dL — ABNORMAL LOW (ref 12.0–15.0)
MCH: 33.4 pg (ref 26.0–34.0)
MCHC: 32.1 g/dL (ref 30.0–36.0)
MCV: 104.2 fL — ABNORMAL HIGH (ref 80.0–100.0)
Platelets: 130 10*3/uL — ABNORMAL LOW (ref 150–400)
RBC: 3.32 MIL/uL — ABNORMAL LOW (ref 3.87–5.11)
RDW: 13.7 % (ref 11.5–15.5)
WBC: 8.4 10*3/uL (ref 4.0–10.5)
nRBC: 0 % (ref 0.0–0.2)

## 2021-09-07 LAB — BASIC METABOLIC PANEL
Anion gap: 7 (ref 5–15)
BUN: 35 mg/dL — ABNORMAL HIGH (ref 8–23)
CO2: 21 mmol/L — ABNORMAL LOW (ref 22–32)
Calcium: 7.7 mg/dL — ABNORMAL LOW (ref 8.9–10.3)
Chloride: 105 mmol/L (ref 98–111)
Creatinine, Ser: 1.56 mg/dL — ABNORMAL HIGH (ref 0.44–1.00)
GFR, Estimated: 33 mL/min — ABNORMAL LOW (ref >60)
Glucose, Bld: 96 mg/dL (ref 70–99)
Potassium: 4.5 mmol/L (ref 3.5–5.1)
Sodium: 133 mmol/L — ABNORMAL LOW (ref 135–145)

## 2021-09-07 LAB — TROPONIN I (HIGH SENSITIVITY)
Troponin I (High Sensitivity): 15 ng/L (ref <18)
Troponin I (High Sensitivity): 13 ng/L (ref <18)

## 2021-09-07 MED ORDER — PIPERACILLIN-TAZOBACTAM 3.375 G IVPB
3.3750 g | Freq: Three times a day (TID) | INTRAVENOUS | Status: DC
  Administered 2021-09-07 – 2021-09-14 (×21): 3.375 g via INTRAVENOUS
  Filled 2021-09-07 (×21): qty 50

## 2021-09-07 MED ORDER — FENTANYL CITRATE (PF) 100 MCG/2ML IJ SOLN
INTRAMUSCULAR | Status: DC | PRN
  Administered 2021-09-07: 25 ug via INTRAVENOUS

## 2021-09-07 MED ORDER — OXYCODONE HCL 5 MG PO TABS: 5.0000 mg | ORAL_TABLET | Freq: Four times a day (QID) | ORAL | 0 refills | Status: DC | PRN

## 2021-09-07 MED ORDER — ENSURE ENLIVE PO LIQD: 237.0000 mL | Freq: Two times a day (BID) | ORAL | Status: DC

## 2021-09-07 MED ORDER — ACETAMINOPHEN 10 MG/ML IV SOLN
1000.0000 mg | Freq: Four times a day (QID) | INTRAVENOUS | Status: AC
Start: 2021-09-07 — End: 2021-09-09
  Administered 2021-09-07 – 2021-09-09 (×7): 1000 mg via INTRAVENOUS
  Filled 2021-09-07 (×7): qty 100

## 2021-09-07 MED ORDER — IOHEXOL 350 MG/ML SOLN
60.0000 mL | Freq: Once | INTRAVENOUS | Status: AC | PRN
  Administered 2021-09-07: 60 mL via INTRAVENOUS

## 2021-09-07 MED ORDER — FUROSEMIDE 20 MG PO TABS
20.0000 mg | ORAL_TABLET | Freq: Once | ORAL | Status: AC
  Administered 2021-09-07: 20 mg via ORAL
  Filled 2021-09-07: qty 1

## 2021-09-07 MED ORDER — FENTANYL CITRATE (PF) 100 MCG/2ML IJ SOLN
INTRAMUSCULAR | Status: AC
  Filled 2021-09-07: qty 2

## 2021-09-07 MED ORDER — IOHEXOL 300 MG/ML  SOLN: 60.0000 mL | Freq: Once | INTRAMUSCULAR | Status: DC | PRN

## 2021-09-07 MED ORDER — KCL IN DEXTROSE-NACL 20-5-0.9 MEQ/L-%-% IV SOLN
INTRAVENOUS | Status: DC
  Filled 2021-09-07 (×2): qty 1000

## 2021-09-07 MED ORDER — ALBUTEROL SULFATE (2.5 MG/3ML) 0.083% IN NEBU
2.5000 mg | INHALATION_SOLUTION | RESPIRATORY_TRACT | Status: DC | PRN
  Administered 2021-09-07: 2.5 mg via RESPIRATORY_TRACT
  Filled 2021-09-07: qty 3

## 2021-09-07 MED ORDER — METOPROLOL TARTRATE 25 MG PO TABS
12.5000 mg | ORAL_TABLET | Freq: Two times a day (BID) | ORAL | Status: DC
  Administered 2021-09-07: 12.5 mg via ORAL
  Filled 2021-09-07 (×2): qty 1

## 2021-09-07 NOTE — Procedures (Signed)
Interventional Radiology Procedure Note  Procedure: CT RT CHEST TUBE 14 FR    Complications: None  Estimated Blood Loss:  MIN  Findings: THIN BLOODY PLEURAL FLD ASPIRATED CX SENT     Tamera Punt, MD

## 2021-09-07 NOTE — Progress Notes (Addendum)
Mobility Specialist - Progress Note   09/07/21 1100  Mobility  Activity Ambulated with assistance in room;Transferred to/from BSC  Level of Assistance Moderate assist, patient does 50-74%  Assistive Device Front wheel walker;BSC  Distance Ambulated (ft) 6 ft  Activity Response Tolerated well  $Mobility charge 1 Mobility     Pt received on BSC. Assist for peri-hygiene d/t need for BUE support onto RW. NT in to assist. Cues for hand placement. While performing pericare, pt experienced LOB x2 with modA from author to recover and prevent fall. No buckling noted. FOF. Pt ambulated back to bed and returned supine with min-modA. SOB on exertion. Pt left in bed with alarm set, needs in reach. RN notified.    Kathee Delton Mobility Specialist 09/07/21, 11:34 AM

## 2021-09-07 NOTE — Evaluation (Signed)
Physical Therapy Evaluation Patient Details Name: Christina [name] MRN: [id number] DOB: 06/30/1935 Today's Date: 09/07/2021  History of Present Illness  Christina [name] is a 86 y.o. female medical history significant for coronary artery disease, COPD, pretension.  She Is being referred for evaluation of paraesophageal hernia. Post-Op s/p robotic assisted laparoscopic paraesophageal hernia repair with Nissen fundoplication 10/14/90.   Clinical Impression  Patient received in bed, she is agreeable to PT assessment. Patient received on 2 liters O2. She requires mod A with bed mobility ( although husband jumping in trying to assist and not allowing me to direct) Probably requires min A. Patient is able to stand from elevated surface with min A. She ambulated 150 feet with RW and min guard. Patient limited by fatigue and sob. She will continue to benefit from skilled PT to improve strength, activity tolerance and safety with mobility for hopeful return home with family assist.         Recommendations for follow up therapy are one component of a multi-disciplinary discharge planning process, led by the attending physician.  Recommendations may be updated based on patient status, additional functional criteria and insurance authorization.  Follow Up Recommendations Home health PT      Assistance Recommended at Discharge Frequent or constant Supervision/Assistance  Patient can return home with the following  A little help with walking and/or transfers;A lot of help with bathing/dressing/bathroom;Assistance with cooking/housework;Assist for transportation    Equipment Recommendations Rolling walker (2 wheels)  Recommendations for Other Services  OT consult    Functional Status Assessment Patient has had a recent decline in their functional status and demonstrates the ability to make significant improvements in function in a reasonable and predictable amount of time.     Precautions /  Restrictions Precautions Precautions: Fall Restrictions Weight Bearing Restrictions: No      Mobility  Bed Mobility Overal bed mobility: Needs Assistance Bed Mobility: Supine to Sit, Sit to Supine     Supine to sit: Min assist Sit to supine: Min assist        Transfers Overall transfer level: Needs assistance Equipment used: Rolling walker (2 wheels) Transfers: Sit to/from Stand Sit to Stand: Min assist, From elevated surface                Ambulation/Gait Ambulation/Gait assistance: Min guard Gait Distance (Feet): 150 Feet Assistive device: Rolling walker (2 wheels) Gait Pattern/deviations: Step-through pattern, Decreased step length - right, Decreased step length - left Gait velocity: decr     General Gait Details: patient ambulated on 2 liters O2 with sob, O2 sats at 94% despite heavy breathing upon return to room.  Stairs            Wheelchair Mobility    Modified Rankin (Stroke Patients Only)       Balance Overall balance assessment: Needs assistance Sitting-balance support: Feet supported Sitting balance-Leahy Scale: Fair Sitting balance - Comments: initially has posterior lean in sitting until positioned well at edge of bed with assist. Postural control: Posterior lean Standing balance support: Bilateral upper extremity supported, During functional activity Standing balance-Leahy Scale: Fair Standing balance comment: benefits from B UE                             Pertinent Vitals/Pain Pain Assessment Pain Assessment: Faces Faces Pain Scale: Hurts little more Pain Location: abdomen Pain Descriptors / Indicators: Sore, Grimacing, Guarding Pain Intervention(s): Monitored during session, Repositioned    Home  Living Family/patient expects to be discharged to:: Private residence Living Arrangements: Spouse/significant other Available Help at Discharge: Family;Available 24 hours/day Type of Home: House Home Access: Level  entry       Home Layout: One level Home Equipment: Rollator (4 wheels);Other (comment)      Prior Function Prior Level of Function : Independent/Modified Independent;Driving             Mobility Comments: uses rollator for community distances recently ADLs Comments: limited driving 2/2 macular degeneration     Hand Dominance        Extremity/Trunk Assessment   Upper Extremity Assessment Upper Extremity Assessment: Defer to OT evaluation    Lower Extremity Assessment Lower Extremity Assessment: Generalized weakness    Cervical / Trunk Assessment Cervical / Trunk Assessment: Normal  Communication   Communication: No difficulties  Cognition Arousal/Alertness: Awake/alert Behavior During Therapy: WFL for tasks assessed/performed Overall Cognitive Status: Within Functional Limits for tasks assessed                                          General Comments      Exercises     Assessment/Plan    PT Assessment Patient needs continued PT services  PT Problem List Decreased strength;Decreased mobility;Decreased activity tolerance;Decreased balance;Pain;Cardiopulmonary status limiting activity;Decreased safety awareness       PT Treatment Interventions DME instruction;Therapeutic exercise;Gait training;Balance training;Stair training;Functional mobility training;Therapeutic activities;Patient/family education    PT Goals (Current goals can be found in the Care Plan section)  Acute Rehab PT Goals Patient Stated Goal: to improve PT Goal Formulation: With patient Time For Goal Achievement: 09/21/21 Potential to Achieve Goals: Good    Frequency Min 2X/week     Co-evaluation               AM-PAC PT "6 Clicks" Mobility  Outcome Measure Help needed turning from your back to your side while in a flat bed without using bedrails?: A Lot Help needed moving from lying on your back to sitting on the side of a flat bed without using bedrails?: A  Lot Help needed moving to and from a bed to a chair (including a wheelchair)?: A Little Help needed standing up from a chair using your arms (e.g., wheelchair or bedside chair)?: A Little Help needed to walk in hospital room?: A Little Help needed climbing 3-5 steps with a railing? : A Lot 6 Click Score: 15    End of Session Equipment Utilized During Treatment: Gait belt Activity Tolerance: Patient tolerated treatment well Patient left: in bed;with family/visitor present Nurse Communication: Mobility status PT Visit Diagnosis: Other abnormalities of gait and mobility (R26.89);Muscle weakness (generalized) (M62.81);Difficulty in walking, not elsewhere classified (R26.2);Pain;Unsteadiness on feet (R26.81) Pain - part of body:  (abdomen)    Time: 2836-6294 PT Time Calculation (min) (ACUTE ONLY): 42 min   Charges:   PT Evaluation $PT Eval Moderate Complexity: 1 Mod PT Treatments $Gait Training: 8-22 mins        Krysten Veronica, PT, GCS 09/07/21,3:05 PM

## 2021-09-07 NOTE — Consult Note (Addendum)
Initial Consultation Note   Patient: Christina [name] YPP:[id number] DOB: 07-24-1935 PCP: [name], FNP DOA: 09/05/2021 DOS: the patient was seen and examined on 09/07/2021 Primary service: Jules Husbands, MD  Referring physician: Dr Dahlia Byes Reason for consult: Fever, SOB   Assessment and Plan: * Fever Patient with a low-grade fever, Tmax of 100.5 F. Etiology of fever could be secondary to atelectasis versus pneumonia Chest x-ray shows diffuse bilateral interstitial pulmonary opacity concerning for edema in the setting of cardiomegaly. Obtain CT scan of the chest without contrast for further evaluation We will send blood cultures and respiratory viral panel  COPD (chronic obstructive pulmonary disease) (HCC) Stable and not acutely exacerbated Continue as needed bronchodilator therapy as well as inhaled steroids  Acute respiratory failure with hypoxia ([place]) Patient endorsing shortness of breath when compared to her baseline on room air pulse oximetry of 86% at rest currently on 2 L with improvement in her pulse oximetry to 94%. Patient has conversational dyspnea Acute respiratory failure likely secondary to atelectasis versus pneumonia versus pulmonary edema Patient received a dose of Lasix 20 mg Follow-up results of CT scan of the chest without contrast for further evaluation Continue oxygen supplementation to maintain pulse oximetry greater than 92  PAF (paroxysmal atrial fibrillation) (HCC) Continue metoprolol as tolerated for rate control Apixaban was placed on hold for planned surgery  S/P repair of paraesophageal hernia Stable Further treatment plans per surgery  Primary hypertension Patient is normotensive Continue metoprolol with holding parameters  Stage 3a chronic kidney disease ([place]) Renal function is stable and appears to be at baseline We will monitor closely during this hospitalization.  Coronary artery disease Stable Continue metoprolol with holding  parameters We will resume statins when appropriate       TRH will continue to follow the patient.  HPI: Christina [name] is a 86 y.o. female with past medical history significant for COPD, obstructive sleep apnea, peripheral arterial disease, coronary artery disease status post CABG, stage III chronic kidney disease, status post recent repair of paraesophageal hernia (09/05/21). Medical consult requested for evaluation of worsening shortness of breath from her baseline associated with a low-grade fever (Tmax of 100.5) Patient has a cough productive of clear phlegm and states that at baseline she is short of breath from her COPD but this is worse.  She denies having any chest pain and has no lower extremity swelling.  She denies having any abdominal pain, no changes in her bowel habits, no urinary symptoms, no dizziness, no lightheadedness, no palpitations, no blurred vision no focal deficit.  Review of Systems: As mentioned in the history of present illness. All other systems reviewed and are negative. Past Medical History:  Diagnosis Date   Adenomatous colon polyp    Anemia    Aortic atherosclerosis (HCC)    Arthritis    Barrett's esophagus    Bilateral carotid artery disease ([place])    a.) carotid doppler 07/18/2020: 4-58% RICA, 09-98% LICA; b.) carotid doppler 33/82/5053: 97-67% LICA, no sig RICA; c.) carotid doppler 01/23/2021: 3-41% RICA, 93-79% LICA; d.) carotid doppler 02/40/9735: 32-99% LICA, near norm RICA   CKD (chronic kidney disease), stage III (HCC)    COPD (chronic obstructive pulmonary disease) ([place])    Coronary artery disease 04/21/2004   a.) LHC 04/21/2004 [facility]): EF 66%, 50% dLCx, 75% pLCx, 75% oRCA, 50% pRCA --> transferred to [facility] for CVTS consult; b.) 3v CABG   Dyspnea on exertion    GERD (gastroesophageal reflux disease)  Hiatal hernia    Hyperlipidemia    Hypertension    Long term current use of anticoagulant    a.) reduced dose apixaban   Macular  degeneration    OSA on CPAP    PAD (peripheral artery disease) ([place]) 12/10/2016   a.) PTA of BILATERAL common iliac arteries --> 7 x 26 mm stents placed   PAF (paroxysmal atrial fibrillation) (HCC)    a.) CHA2DS2-VASc = 6 (age x 2, sex, HTN, vascular disease, T2DM);  b.) rate/rhythm maintained on oral metoprolol tartrate; chronically anticoagulated using dose reduced apixaban   Peripheral edema    PSVT (paroxysmal supraventricular tachycardia) (HCC)    S/P CABG x 3 04/2004   Schatzki's ring    Scoliosis of thoracolumbar spine    Valvular heart disease    a.) TTE 04/20/2004: EF >55%, mild MR; b.)  TTE 04/10/2021: EF 50-55%, LVH, reduced RV SF, RVE, moderate BAE, mild-mod MR, severe TR.   Past Surgical History:  Procedure Laterality Date   ABDOMINAL HYSTERECTOMY  1980   due to dysfunctional uterine bleeding   APPENDECTOMY  1980   BREAST SURGERY Left 2000   biopsy   CARDIAC CATHETERIZATION     COLONOSCOPY     CORONARY ARTERY BYPASS GRAFT  2006   ESOPHAGOGASTRODUODENOSCOPY (EGD) WITH PROPOFOL N/A 06/08/2021   Procedure: ESOPHAGOGASTRODUODENOSCOPY (EGD) WITH PROPOFOL;  Surgeon: [name], MD;  Location: [facility] ENDOSCOPY;  Service: Gastroenterology;  Laterality: N/A;   HEMORRHOID SURGERY     INSERTION OF MESH  09/05/2021   Procedure: INSERTION OF MESH;  Surgeon: Jules Husbands, MD;  Location: [facility] ORS;  Service: General;;   LOWER EXTREMITY ANGIOGRAPHY Right 02/25/2017   Procedure: LOWER EXTREMITY ANGIOGRAPHY;  Surgeon: Algernon Huxley, MD;  Location: [place] CV LAB;  Service: Cardiovascular;  Laterality: Right;   LOWER EXTREMITY INTERVENTION  02/25/2017   Procedure: LOWER EXTREMITY INTERVENTION;  Surgeon: Algernon Huxley, MD;  Location: [place] CV LAB;  Service: Cardiovascular;;   LUNG BIOPSY  1999   Negative   TONSILLECTOMY     XI ROBOTIC ASSISTED PARAESOPHAGEAL HERNIA REPAIR N/A 09/05/2021   Procedure: XI ROBOTIC ASSISTED PARAESOPHAGEAL HERNIA REPAIR, RNFA to assist;  Surgeon: Jules Husbands, MD;  Location: [facility] ORS;  Service: General;  Laterality: N/A;   Social History:  reports that she quit smoking about 17 years ago. Her smoking use included cigarettes. She has a 2.50 pack-year smoking history. She has never used smokeless tobacco. She reports that she does not drink alcohol and does not use drugs.  Allergies  Allergen Reactions   Baclofen Other (See Comments)   Codeine Nausea Only   Plavix [Clopidogrel]     brusing all over   Sulfa Antibiotics Nausea Only   Latex Rash    Family History  Problem Relation Age of Onset   Hypertension Mother    Hyperlipidemia Mother    Alzheimer's disease Mother    CAD Mother    Heart attack Father    Lung disease Sister    Heart disease Sister    Diabetes Paternal Grandfather        Type 2   Hearing loss Son     Prior to Admission medications   Medication Sig Start Date End Date Taking? Authorizing Provider  acetaminophen (TYLENOL) 500 MG tablet Take 500 mg by mouth every 8 (eight) hours as needed for mild pain or moderate pain.   Yes [provider]  albuterol (VENTOLIN HFA) 108 (90 Base) MCG/ACT inhaler Inhale  2 puffs into the lungs every 6 (six) hours as needed for wheezing or shortness of breath. 07/17/21  Yes Abernathy, Yetta Flock, NP  azelastine (ASTELIN) 0.1 % nasal spray Place 1 spray into both nostrils daily as needed. 04/17/20  Yes [provider]  carboxymethylcellulose (REFRESH PLUS) 0.5 % SOLN 1 drop 3 (three) times daily as needed.   Yes [provider]  docusate sodium (COLACE) 100 MG capsule Take 200 mg by mouth at bedtime.   Yes [provider]  hydrochlorothiazide (HYDRODIURIL) 12.5 MG tablet Take 12.5 mg by mouth daily. 06/28/20 09/05/21 Yes [provider]  Metoprolol Tartrate 75 MG TABS Take 75 mg by mouth 2 (two) times daily. 06/09/21  Yes [name] T, FNP  pantoprazole (PROTONIX) 40 MG tablet TAKE 1 TABLET (40 MG TOTAL) BY MOUTH ONCE DAILY TAKE 30 MINUTES BEFORE  BREAKFAST. 11/03/20  Yes [name], FNP  rosuvastatin (CRESTOR) 20 MG tablet Take 1 tablet (20 mg total) by mouth every evening. 07/11/21  Yes [name], FNP  SYMBICORT 160-4.5 MCG/ACT inhaler 1 puff 2 (two) times daily. 07/26/21  Yes [provider]  Tiotropium Bromide-Olodaterol (STIOLTO RESPIMAT) 2.5-2.5 MCG/ACT AERS Inhale 2 puffs into the lungs in the morning and at bedtime. 07/17/21  Yes Abernathy, Yetta Flock, NP  tobramycin (TOBREX) 0.3 % ophthalmic solution  04/06/17  Yes [provider]  apixaban (ELIQUIS) 2.5 MG TABS tablet Take by mouth 2 (two) times daily.    [provider]  Ascorbic Acid (VITAMIN C) 1000 MG tablet Take 1,000 mg by mouth daily.    [provider]  Calcium Carb-Cholecalciferol (CALCIUM 600+D3 PO) Take 1 tablet by mouth daily.    [provider]  Coenzyme Q10 (COQ-10) 100 MG capsule Take 1 capsule (100 mg total) by mouth daily. 11/03/20   [name], FNP  Multiple Vitamin (MULTI-VITAMIN DAILY PO) Take 1 tablet by mouth daily.    [provider]  oxyCODONE (OXY IR/ROXICODONE) 5 MG immediate release tablet Take 1 tablet (5 mg total) by mouth every 6 (six) hours as needed for severe pain or breakthrough pain. 09/07/21   Tylene Fantasia, PA-C    Physical Exam: Vitals:   09/07/21 0721 09/07/21 0757 09/07/21 0903 09/07/21 0907  BP:  (!) 102/53 (!) 105/50   Pulse:  82    Resp:  18 16   Temp:  (!) 100.5 F (38.1 C) 100.3 F (37.9 C)   TempSrc:  Oral Oral   SpO2: 91% (!) 86% (!) 86% 94%  Weight:      Height:       Physical Exam Vitals and nursing note reviewed.  Constitutional:      Appearance: Normal appearance.     Comments: Elderly female sitting up in bed, has conversational dyspnea  HENT:     Head: Normocephalic and atraumatic.     Nose:     Comments: Nasal cannula in place    Mouth/Throat:     Mouth: Mucous membranes are moist.  Eyes:     Conjunctiva/sclera: Conjunctivae normal.  Cardiovascular:      Rate and Rhythm: Normal rate and regular rhythm.  Pulmonary:     Breath sounds: Rales present.     Comments: Fine rales at the bases bilaterally Abdominal:     General: Abdomen is flat. Bowel sounds are normal.     Palpations: Abdomen is soft.  Musculoskeletal:        General: Normal range of motion.     Cervical  back: Normal range of motion and neck supple.  Skin:    General: Skin is warm and dry.  Neurological:     General: No focal deficit present.     Mental Status: She is alert.     Motor: Weakness present.  Psychiatric:        Mood and Affect: Mood normal.        Behavior: Behavior normal.     Data Reviewed:  Relevant notes from primary care and specialist visits, past discharge summaries as available in EHR, including Care Everywhere. Prior diagnostic testing as pertinent to current admission diagnoses Updated medications and problem lists for reconciliation ED course, including vitals, labs, imaging, treatment and response to treatment Triage notes, nursing and pharmacy notes and ED provider's notes Notable results as noted in HPI Labs reviewed.  Sodium 133, potassium 4.5, chloride 105, bicarb 21, glucose 96, BUN 35, creatinine 1.56, calcium 7.7, white count 8.4, hemoglobin 11.1, hematocrit 34.6, platelet count 130 Chest x-ray reviewed by me shows improved right pleural effusion, now small. Persistent small, likely loculated left pleural effusion. Unchanged mild, diffuse bilateral interstitial pulmonary opacity, likely edema in the setting of cardiomegaly. No focal airspace opacity.  There are no new results to review at this time.    Family Communication: Greater than 50% of time was spent discussing patient's condition and plan of care with her and her husband at the bedside.  All questions and concerns have been addressed.  They verbalized understanding and agrees with plan. Primary team communication:  Thank you very much for involving [place] in the care of your  patient.  Author: Collier Bullock, MD 09/07/2021 1:22 PM  For on call review [email or web address].

## 2021-09-07 NOTE — Assessment & Plan Note (Addendum)
Renal function is slowly trending up, monitor closely.  Consider nephro consult if it continues to get worse.  Acute worsening could be due to hypotension leading to ATN  Lab Results  Component Value Date   CREATININE 1.95 (H) 09/08/2021   CREATININE 1.56 (H) 09/07/2021   CREATININE 1.50 (H) 09/06/2021

## 2021-09-07 NOTE — Assessment & Plan Note (Addendum)
Further treatment plans per surgery

## 2021-09-07 NOTE — Assessment & Plan Note (Addendum)
Holding metoprolol due to hypotension Apixaban was placed on hold for planned surgery.  On heparin drip for now

## 2021-09-07 NOTE — Assessment & Plan Note (Addendum)
Patient endorsing shortness of breath when compared to her baseline on room air pulse oximetry of 86% at rest currently on 2 L with improvement in her pulse oximetry to 94%. Acute respiratory failure likely secondary to atelectasis versus pneumonia versus pulmonary edema Continue oxygen supplementation to maintain pulse oximetry greater than 92.  Wean oxygen as able

## 2021-09-07 NOTE — Assessment & Plan Note (Addendum)
Stable and not acutely exacerbated Continue as needed bronchodilator therapy as well as inhaled steroids 

## 2021-09-07 NOTE — Progress Notes (Signed)
Mobility Specialist - Progress Note   09/07/21 1000  Mobility  Activity Ambulated with assistance in hallway;Stood at bedside;Dangled on edge of bed  Level of Assistance Moderate assist, patient does 50-74%  Assistive Device Front wheel walker  Distance Ambulated (ft) 180 ft  Activity Response Tolerated fair  $Mobility charge 1 Mobility     Pt lying in bed upon arrival using 2L. Pt completes bed mobility with ModA -- significant SOB while dangling EOB and needed assist for UE support to prevent posterior lean. Completes STS with ModA and ambulates 1 lap with SBA. Pt O2 destats to 86% and is bumped to 3L for return to room -- SOB noted and voices pain in abdomen throughout sessions. Pt descends to [facility] with MinA and heavy verbal cues. Pt O2 lowered back to 2L with O2 at 91%. Respiratory Therapist enters upon exit and NT is aware that  pt is left on BSC.  Merrily Brittle Mobility Specialist 09/07/21, 10:43 AM

## 2021-09-07 NOTE — Progress Notes (Signed)
Patient clinically stable post 14FR CT placement per DR Shick,vitals stable, only Fentanyl 25 mcg IV as well as local given for procedure. Ct to suction upon return to room 225 per MD IR request. Husband at bedside post procedure with update given. Report given to care nurse at bedside post procedure.

## 2021-09-07 NOTE — Assessment & Plan Note (Addendum)
Patient is hypotensive.  Hold metoprolol

## 2021-09-07 NOTE — Consult Note (Signed)
Chief Complaint: Patient was seen in consultation today for right sided loculated pleural effusion at the request of Tylene Fantasia, PA-C  Referring Physician(s): Tylene Fantasia, PA-C  Supervising Physician: Daryll Brod  Patient Status: [place] - In-pt  History of Present Illness: Christina [name] is a 86 y.o. female with PMHx significant for recent robotic assisted laparoscopic repair of paraesophageal hiatal hernia and nissen fundoplication on 8/1 for gerd. The patient was doing well post operatively until this morning when she developed loss of balance, shortness of breath and low grade fever. CT done revealed right loculated pleural effusion and concern for post op esophageal leak. IR received request for chest tube placement.  The patient does admit to difficulty breathing currently with increased congestion, she does not normally wear oxygen.  Past Medical History:  Diagnosis Date   Adenomatous colon polyp    Anemia    Aortic atherosclerosis (HCC)    Arthritis    Barrett's esophagus    Bilateral carotid artery disease ([place])    a.) carotid doppler 07/18/2020: 1-03% RICA, 15-94% LICA; b.) carotid doppler 58/59/2924: 46-28% LICA, no sig RICA; c.) carotid doppler 01/23/2021: 6-38% RICA, 17-71% LICA; d.) carotid doppler 16/57/9038: 33-38% LICA, near norm RICA   CKD (chronic kidney disease), stage III (HCC)    COPD (chronic obstructive pulmonary disease) ([place])    Coronary artery disease 04/21/2004   a.) LHC 04/21/2004 [facility]): EF 66%, 50% dLCx, 75% pLCx, 75% oRCA, 50% pRCA --> transferred to [facility] for CVTS consult; b.) 3v CABG   Dyspnea on exertion    GERD (gastroesophageal reflux disease)    Hiatal hernia    Hyperlipidemia    Hypertension    Long term current use of anticoagulant    a.) reduced dose apixaban   Macular degeneration    OSA on CPAP    PAD (peripheral artery disease) ([place]) 12/10/2016   a.) PTA of BILATERAL common iliac arteries --> 7 x 26 mm  stents placed   PAF (paroxysmal atrial fibrillation) (HCC)    a.) CHA2DS2-VASc = 6 (age x 2, sex, HTN, vascular disease, T2DM);  b.) rate/rhythm maintained on oral metoprolol tartrate; chronically anticoagulated using dose reduced apixaban   Peripheral edema    PSVT (paroxysmal supraventricular tachycardia) ([place])    S/P CABG x 3 04/2004   Schatzki's ring    Scoliosis of thoracolumbar spine    Valvular heart disease    a.) TTE 04/20/2004: EF >55%, mild MR; b.)  TTE 04/10/2021: EF 50-55%, LVH, reduced RV SF, RVE, moderate BAE, mild-mod MR, severe TR.    Past Surgical History:  Procedure Laterality Date   ABDOMINAL HYSTERECTOMY  1980   due to dysfunctional uterine bleeding   APPENDECTOMY  1980   BREAST SURGERY Left 2000   biopsy   CARDIAC CATHETERIZATION     COLONOSCOPY     CORONARY ARTERY BYPASS GRAFT  2006   ESOPHAGOGASTRODUODENOSCOPY (EGD) WITH PROPOFOL N/A 06/08/2021   Procedure: ESOPHAGOGASTRODUODENOSCOPY (EGD) WITH PROPOFOL;  Surgeon: [name], MD;  Location: [facility] ENDOSCOPY;  Service: Gastroenterology;  Laterality: N/A;   HEMORRHOID SURGERY     INSERTION OF MESH  09/05/2021   Procedure: INSERTION OF MESH;  Surgeon: Jules Husbands, MD;  Location: [facility] ORS;  Service: General;;   LOWER EXTREMITY ANGIOGRAPHY Right 02/25/2017   Procedure: LOWER EXTREMITY ANGIOGRAPHY;  Surgeon: Algernon Huxley, MD;  Location: [place] CV LAB;  Service: Cardiovascular;  Laterality: Right;   LOWER EXTREMITY INTERVENTION  02/25/2017   Procedure:  LOWER EXTREMITY INTERVENTION;  Surgeon: Algernon Huxley, MD;  Location: [place] CV LAB;  Service: Cardiovascular;;   LUNG BIOPSY  1999   Negative   TONSILLECTOMY     XI ROBOTIC ASSISTED PARAESOPHAGEAL HERNIA REPAIR N/A 09/05/2021   Procedure: XI ROBOTIC ASSISTED PARAESOPHAGEAL HERNIA REPAIR, RNFA to assist;  Surgeon: Jules Husbands, MD;  Location: [facility] ORS;  Service: General;  Laterality: N/A;    Allergies: Baclofen, Codeine, Plavix [clopidogrel], Sulfa  antibiotics, and Latex  Medications: Prior to Admission medications   Medication Sig Start Date End Date Taking? Authorizing Provider  acetaminophen (TYLENOL) 500 MG tablet Take 500 mg by mouth every 8 (eight) hours as needed for mild pain or moderate pain.   Yes [provider]  albuterol (VENTOLIN HFA) 108 (90 Base) MCG/ACT inhaler Inhale 2 puffs into the lungs every 6 (six) hours as needed for wheezing or shortness of breath. 07/17/21  Yes Abernathy, Yetta Flock, NP  azelastine (ASTELIN) 0.1 % nasal spray Place 1 spray into both nostrils daily as needed. 04/17/20  Yes [provider]  carboxymethylcellulose (REFRESH PLUS) 0.5 % SOLN 1 drop 3 (three) times daily as needed.   Yes [provider]  docusate sodium (COLACE) 100 MG capsule Take 200 mg by mouth at bedtime.   Yes [provider]  hydrochlorothiazide (HYDRODIURIL) 12.5 MG tablet Take 12.5 mg by mouth daily. 06/28/20 09/05/21 Yes [provider]  Metoprolol Tartrate 75 MG TABS Take 75 mg by mouth 2 (two) times daily. 06/09/21  Yes [name] T, FNP  pantoprazole (PROTONIX) 40 MG tablet TAKE 1 TABLET (40 MG TOTAL) BY MOUTH ONCE DAILY TAKE 30 MINUTES BEFORE BREAKFAST. 11/03/20  Yes [name], FNP  rosuvastatin (CRESTOR) 20 MG tablet Take 1 tablet (20 mg total) by mouth every evening. 07/11/21  Yes [name], FNP  SYMBICORT 160-4.5 MCG/ACT inhaler 1 puff 2 (two) times daily. 07/26/21  Yes [provider]  Tiotropium Bromide-Olodaterol (STIOLTO RESPIMAT) 2.5-2.5 MCG/ACT AERS Inhale 2 puffs into the lungs in the morning and at bedtime. 07/17/21  Yes Abernathy, Yetta Flock, NP  tobramycin (TOBREX) 0.3 % ophthalmic solution  04/06/17  Yes [provider]  apixaban (ELIQUIS) 2.5 MG TABS tablet Take by mouth 2 (two) times daily.    [provider]  Ascorbic Acid (VITAMIN C) 1000 MG tablet Take 1,000 mg by mouth daily.    [provider]  Calcium Carb-Cholecalciferol (CALCIUM  600+D3 PO) Take 1 tablet by mouth daily.    [provider]  Coenzyme Q10 (COQ-10) 100 MG capsule Take 1 capsule (100 mg total) by mouth daily. 11/03/20   [name], FNP  Multiple Vitamin (MULTI-VITAMIN DAILY PO) Take 1 tablet by mouth daily.    [provider]  oxyCODONE (OXY IR/ROXICODONE) 5 MG immediate release tablet Take 1 tablet (5 mg total) by mouth every 6 (six) hours as needed for severe pain or breakthrough pain. 09/07/21   Tylene Fantasia, PA-C     Family History  Problem Relation Age of Onset   Hypertension Mother    Hyperlipidemia Mother    Alzheimer's disease Mother    CAD Mother    Heart attack Father    Lung disease Sister    Heart disease Sister    Diabetes Paternal Grandfather        Type 2   Hearing loss Son     Social History   Socioeconomic History   Marital status: Married    Spouse name: Cliffton  Mathis Bud   Number of children: 2   Years of education: 12   Highest education level: 12th grade  Occupational History   Occupation: retired  Tobacco Use   Smoking status: Former    Packs/day: 0.50    Years: 5.00    Total pack years: 2.50    Types: Cigarettes    Quit date: 04/24/2004    Years since quitting: 17.3   Smokeless tobacco: Never  Vaping Use   Vaping Use: Never used  Substance and Sexual Activity   Alcohol use: No   Drug use: No   Sexual activity: Yes  Other Topics Concern   Not on file  Social History Narrative   Not on file   Social Determinants of Health   Financial Resource Strain: Low Risk  (04/10/2021)   Overall Financial Resource Strain (CARDIA)    Difficulty of Paying Living Expenses: Not hard at all  Food Insecurity: No Food Insecurity (02/23/2020)   Hunger Vital Sign    Worried About Running Out of Food in the Last Year: Never true    Ran Out of Food in the Last Year: Never true  Transportation Needs: No Transportation Needs (02/23/2020)   PRAPARE - Hydrologist (Medical): No     Lack of Transportation (Non-Medical): No  Physical Activity: Inactive (02/23/2020)   Exercise Vital Sign    Days of Exercise per Week: 0 days    Minutes of Exercise per Session: 0 min  Stress: No Stress Concern Present (02/23/2020)   [place]    Feeling of Stress : Not at all  Social Connections: Moderately Integrated (02/23/2020)   Social Connection and Isolation Panel [NHANES]    Frequency of Communication with Friends and Family: Three times a week    Frequency of Social Gatherings with Friends and Family: More than three times a week    Attends Religious Services: More than 4 times per year    Active Member of [employer] or Organizations: No    Attends Archivist Meetings: Never    Marital Status: Married    Review of Systems: A 12 point ROS discussed and pertinent positives are indicated in the HPI above.  All other systems are negative.  Review of Systems  Vital Signs: BP (!) 105/50 (BP Location: Left Arm)   Pulse 82   Temp 100.3 F (37.9 C) (Oral)   Resp 16   Ht '5\' 6"'$  (1.676 m)   Wt 168 lb (76.2 kg)   SpO2 94%   BMI 27.12 kg/m   Physical Exam Constitutional:      Comments: Appears to have some labored breathing  HENT:     Head: Normocephalic and atraumatic.  Cardiovascular:     Rate and Rhythm: Normal rate and regular rhythm.  Pulmonary:     Comments: Decreased BS on right, some labored breathing noted  Neurological:     Mental Status: She is alert and oriented to person, place, and time.    Imaging: CT CHEST WO CONTRAST  Result Date: 09/07/2021 CLINICAL DATA:  Pneumonia EXAM: CT CHEST WITHOUT CONTRAST TECHNIQUE: Multidetector CT imaging of the chest was performed following the standard protocol without IV contrast. RADIATION DOSE REDUCTION: This exam was performed according to the departmental dose-optimization program which includes automated exposure control, adjustment of the mA  and/or kV according to patient size and/or use of iterative reconstruction technique. COMPARISON:  Chest x-ray dated September 07, 2021; CT  abdomen and pelvis dated May 19, 2021; leak study dated September 06, 2021 FINDINGS: Cardiovascular: Cardiomegaly. No pericardial effusion. Severe left main and three-vessel coronary artery calcifications status post CABG. Normal caliber thoracic aorta with moderate calcified plaque. Mediastinum/Nodes: Thyroid is unremarkable. Calcified left hilar lymph nodes. No pathologically enlarged lymph nodes seen in the chest. Lungs/Pleura: Central airways are patent. Moderate right and small left pleural effusions with associated atelectasis. Focal area of hyperdensity is seen within the right pleural effusion adjacent to the lower esophagus on series 2, image 108. Calcified granuloma of the left lower lobe. Small solid perifissural nodules are seen in the right middle lobe, likely intrapulmonary lymph nodes, no follow-up imaging is recommended. Upper Abdomen: Postsurgical changes of prior Nissen fundoplication. Small locule of extraluminal air is seen anterior to the liver, likely postsurgical. Musculoskeletal: Prior median sternotomy with intact sternal wires. No chest wall mass or suspicious bone lesions identified. IMPRESSION: 1. Moderate right pleural effusion with focal area of hyperdensity seen within the right pleural effusion adjacent to the expected area of the lower esophagus, finding may be due to a slipped portion of the wrap or contained leak. Recommend CT esophagram with oral and IV contrast for further evaluation. 2. Trace right pneumothorax. 3. Small left pleural effusion. Critical Value/emergent results were called by telephone at the time of interpretation on 09/07/2021 at 1:52 pm to provider Delena Bali, [place], who verbally acknowledged these results. Electronically Signed   By: Yetta Glassman M.D.   On: 09/07/2021 13:55   DG Chest Port 1 View  Result Date: 09/07/2021 CLINICAL  DATA:  Shortness of breath EXAM: PORTABLE CHEST 1 VIEW COMPARISON:  04/09/2021 FINDINGS: Cardiomegaly status post median sternotomy and CABG. Improved right pleural effusion, now small. Persistent small, likely loculated left pleural effusion. Unchanged mild, diffuse bilateral interstitial pulmonary opacity. Benign, calcified nodule of the left midlung. IMPRESSION: 1. Improved right pleural effusion, now small. Persistent small, likely loculated left pleural effusion. 2. Unchanged mild, diffuse bilateral interstitial pulmonary opacity, likely edema in the setting of cardiomegaly. No focal airspace opacity. Electronically Signed   By: Delanna Ahmadi M.D.   On: 09/07/2021 10:25   DG UGI W SINGLE CM (SOL OR THIN BA)  Result Date: 09/06/2021 CLINICAL DATA:  Patient s/p paraesophageal hernia repair with Nissen fundoplication. Request for upper GI study to evaluate for leak. EXAM: DG UGI W SINGLE CM TECHNIQUE: Single contrast examination was performed using water-soluble contrast (Omnipaque). This exam was performed by Narda Rutherford, NP, and was supervised and interpreted by Sabino Dick, MD. FLUOROSCOPY: Radiation Exposure Index (as provided by the fluoroscopic device): 50.90 mGy Kerma COMPARISON:  Double contrast esophagram study dated 05/16/2021 FINDINGS: Tertiary contractions of the esophagus can be seen with presbyesophagus. There is a delay in transit with to and fro motion of contrast. Postoperative changes of hiatal hernia repair. There is mild narrowing at the Nissen wrap. There is no evidence of contrast leak. IMPRESSION: 1. Tertiary contractions of the esophagus can be seen with presbyesophagus. 2. Postoperative changes of hiatal hernia repair. No evidence of contrast leak. Read by: Narda Rutherford, AGNP-BC Electronically Signed   By: Marijo Conception M.D.   On: 09/06/2021 15:16    Labs:  CBC: Recent Labs    08/29/21 1101 09/05/21 1705 09/06/21 0456 09/07/21 1146  WBC 6.4 8.6 7.0 8.4  HGB 13.1 12.7 11.9*  11.1*  HCT 40.8 40.2 38.4 34.6*  PLT 166 142* 146* 130*    COAGS: No results for input(s): "INR", "APTT" in the  last 8760 hours.  BMP: Recent Labs    04/11/21 0602 08/29/21 1101 09/05/21 1705 09/06/21 0456 09/07/21 1146  NA 143 142  --  139 133*  K 4.5 4.2  --  4.6 4.5  CL 107 106  --  108 105  CO2 25 27  --  25 21*  GLUCOSE 152* 87  --  130* 96  BUN 29* 23  --  29* 35*  CALCIUM 9.0 9.4  --  8.0* 7.7*  CREATININE 1.04* 1.14* 1.48* 1.50* 1.56*  GFRNONAA 53* 48* 35* 34* 33*   Assessment and Plan: This is a 86 year old female with PMHx significant for recent robotic assisted laparoscopic repair of paraesophageal hiatal hernia and nissen fundoplication on 8/1 for gerd. The patient was doing well post operatively until this morning when she developed loss of balance, shortness of breath and low grade fever. CT done revealed right loculated pleural effusion and concern for post op esophageal leak. IR received request for chest tube placement.   The patient has not been NPO, sq heparin given today- Dr. Annamaria Boots aware and ok to proceed, imaging, labs and vitals have been reviewed. Ok to proceed today with chest tube placement with local anesthesia and fentanyl only- patient in agreement to proceed.  Risks and benefits of chest tube placement were discussed with the patient and her husband bedside including bleeding, infection, damage to adjacent structures, malfunction of the tube requiring additional procedures and sepsis.  All of the patient's questions were answered, patient is agreeable to proceed. Consent signed and in chart.   Thank you for this interesting consult.  I greatly enjoyed meeting ATLAS KUC and look forward to participating in their care.  A copy of this report was sent to the requesting provider on this date.  Electronically Signed: Hedy Jacob, PA-C 09/07/2021, 3:07 PM   I spent a total of 20 Minutes in face to face in clinical consultation, greater  than 50% of which was counseling/coordinating care for loculated right pleural effusion.

## 2021-09-07 NOTE — Discharge Summary (Deleted)
[facility] SURGICAL DISCHARGE SUMMARY  Patient ID: MARTIKA EGLER MRN: [id number] DOB/AGE: [birth year] 86 y.o.  Admit date: 09/05/2021 Discharge date: 09/07/2021  Discharge Diagnoses Patient Active Problem List   Diagnosis Date Noted   S/P repair of paraesophageal hernia 09/05/2021    Consultants None  Procedures 09/05/2021:  Robotic assisted laparoscopic paraesophageal hernia repair with Nissen fundoplication   HPI: Christina [name] is a 86 y.o. female with history of large paraesophageal hernia and chronic reflux who presents to [facility] for repair on 08/01  Hospital Course: Informed consent was obtained and documented, and patient underwent uneventful robotic assisted laparoscopic paraesophageal hernia repair (Dr Dahlia Byes, 09/05/2021).  Post-operatively, patient did well. We observed her for 24 hours post-op given her fragility and extensive nature of her procedure. She also underwent UGI as a precaution which was reassuring. Advancement of patient's diet and ambulation were well-tolerated. The remainder of patient's hospital course was essentially unremarkable, and discharge planning was initiated accordingly with patient safely able to be discharged home with appropriate discharge instructions, pain control, and outpatient follow-up after all of her questions were answered to her expressed satisfaction.   Discharge Condition: Good   Physical Examination:  Constitutional: Well appearing female, NAD Pulmonary: Normal effort, no respiratory distress Gastrointestinal: Soft, mild incisional soreness, she has mild distension over her stomach, no rebound/guarding Skin: Laparoscopic incisions are CDI with dermabond, no erythema or drainage, some ecchymosis    Allergies as of 09/07/2021       Reactions   Baclofen Other (See Comments)   Codeine Nausea Only   Plavix [clopidogrel]    brusing all over   Sulfa Antibiotics Nausea Only   Latex Rash         Medication List     STOP taking these medications    pantoprazole 40 MG tablet Commonly known as: PROTONIX       TAKE these medications    acetaminophen 500 MG tablet Commonly known as: TYLENOL Take 500 mg by mouth every 8 (eight) hours as needed for mild pain or moderate pain.   albuterol 108 (90 Base) MCG/ACT inhaler Commonly known as: VENTOLIN HFA Inhale 2 puffs into the lungs every 6 (six) hours as needed for wheezing or shortness of breath.   apixaban 2.5 MG Tabs tablet Commonly known as: ELIQUIS Take by mouth 2 (two) times daily.   azelastine 0.1 % nasal spray Commonly known as: ASTELIN Place 1 spray into both nostrils daily as needed.   CALCIUM 600+D3 PO Take 1 tablet by mouth daily.   carboxymethylcellulose 0.5 % Soln Commonly known as: REFRESH PLUS 1 drop 3 (three) times daily as needed.   CoQ-10 100 MG capsule Take 1 capsule (100 mg total) by mouth daily.   docusate sodium 100 MG capsule Commonly known as: COLACE Take 200 mg by mouth at bedtime.   hydrochlorothiazide 12.5 MG tablet Commonly known as: HYDRODIURIL Take 12.5 mg by mouth daily.   Metoprolol Tartrate 75 MG Tabs Take 75 mg by mouth 2 (two) times daily.   MULTI-VITAMIN DAILY PO Take 1 tablet by mouth daily.   oxyCODONE 5 MG immediate release tablet Commonly known as: Oxy IR/ROXICODONE Take 1 tablet (5 mg total) by mouth every 6 (six) hours as needed for severe pain or breakthrough pain.   rosuvastatin 20 MG tablet Commonly known as: Crestor Take 1 tablet (20 mg total) by mouth every evening.   Stiolto Respimat 2.5-2.5 MCG/ACT Aers Generic drug: Tiotropium Bromide-Olodaterol Inhale 2 puffs into the lungs in  the morning and at bedtime.   Symbicort 160-4.5 MCG/ACT inhaler Generic drug: budesonide-formoterol 1 puff 2 (two) times daily.   tobramycin 0.3 % ophthalmic solution Commonly known as: TOBREX   vitamin C 1000 MG tablet Take 1,000 mg by mouth daily.           Follow-up Information     Jules Husbands, MD. Go on 09/20/2021.   Specialty: General Surgery Why: Go to appointment on 08/16 at 9:00 AM....s/p laparoscopic paraesophageal hernia repair Contact information: [address] [place] [address] [phone]                  Time spent on discharge management including discussion of hospital course, clinical condition, outpatient instructions, prescriptions, and follow up with the patient and members of the medical team: >30 minutes  -- Edison Simon , PA-C [facility]  09/07/2021, 8:34 AM [phone] M-F: 7am - 4pm

## 2021-09-07 NOTE — Assessment & Plan Note (Addendum)
Hold metoprolol due to hypotension We will resume statins when appropriate

## 2021-09-07 NOTE — Assessment & Plan Note (Addendum)
Patient with a low-grade fever, Tmax of 100.5 F. Etiology of fever could be secondary to atelectasis versus pneumonia Chest x-ray shows diffuse bilateral interstitial pulmonary opacity concerning for edema in the setting of cardiomegaly. CT chest shows improvement on the right hemithorax after chest tube placement Negative blood cultures and respiratory viral panel as well

## 2021-09-07 NOTE — Progress Notes (Signed)
[place] Hospital Day(s): 0.   Post op day(s): 2 Days Post-Op.   Interval History:  Patient seen and examined No acute events or new complaints overnight.  Initially was ready for DC this morning however developed low grade fever with T-max 100.5, SOB, and difficulty with ambulating Abdomen remains sore No nausea, emesis, trouble swallowing  Labs are pending She tolerated FLD; had BM  Vital signs in last 24 hours: [min-max] current  Temp:  [97.9 F (36.6 C)-100.5 F (38.1 C)] 100.3 F (37.9 C) (08/03 0903) Pulse Rate:  [60-82] 82 (08/03 0757) Resp:  [16-18] 16 (08/03 0903) BP: (102-116)/(50-59) 105/50 (08/03 0903) SpO2:  [86 %-94 %] 94 % (08/03 0907) FiO2 (%):  [21 %] 21 % (08/02 2053)     Height: '5\' 6"'$  (167.6 cm) Weight: 76.2 kg BMI (Calculated): 27.13   Intake/Output last 2 shifts:  08/02 0701 - 08/03 0700 In: 1877 [P.O.:1200; I.V.:677] Out: -    Physical Exam:  Constitutional: alert, cooperative and no distress  Respiratory: breathing non-labored at rest, now on [place], lungs CTAB Cardiovascular: regular rate and sinus rhythm  Gastrointestinal: Soft, incisional soreness, distension and tympany over the stomach, no rebound/guarding. Integumentary: Laparoscopic incisions are CDI with dermabond, no erythema or drainage, some ecchymosis   Labs:     Latest Ref Rng & Units 09/06/2021    4:56 AM 09/05/2021    5:05 PM 08/29/2021   11:01 AM  CBC  WBC 4.0 - 10.5 K/uL 7.0  8.6  6.4   Hemoglobin 12.0 - 15.0 g/dL 11.9  12.7  13.1   Hematocrit 36.0 - 46.0 % 38.4  40.2  40.8   Platelets 150 - 400 K/uL 146  142  166       Latest Ref Rng & Units 09/06/2021    4:56 AM 09/05/2021    5:05 PM 08/29/2021   11:01 AM  CMP  Glucose 70 - 99 mg/dL 130   87   BUN 8 - 23 mg/dL 29   23   Creatinine 0.44 - 1.00 mg/dL 1.50  1.48  1.14   Sodium 135 - 145 mmol/L 139   142   Potassium 3.5 - 5.1 mmol/L 4.6   4.2   Chloride 98 - 111 mmol/L 108   106   CO2 22  - 32 mmol/L 25   27   Calcium 8.9 - 10.3 mg/dL 8.0   9.4      Imaging studies:   CXR (09/07/2021) personally reviewed which shows ? Of atelectasis vs edema, and radiologist report reviewed:  IMPRESSION: 1. Improved right pleural effusion, now small. Persistent small, likely loculated left pleural effusion. 2. Unchanged mild, diffuse bilateral interstitial pulmonary opacity, likely edema in the setting of cardiomegaly. No focal airspace opacity.  Assessment/Plan: 86 y.o. female now with low grade fever, ? Secondary to atelectasis vs edema 2 Days Post-Op s/p robotic assisted laparoscopic paraesophageal hernia repair with Nissen fundoplication.   - Can trial soft diet; spent time again reviewing menu and recommendation; Nissen diet handout is at bedside  - Will add CBC/BMP  - May benefit from lasix   - Hold IVF  - Monitor abdominal examination    - Pain control prn; antiemetics prn  - Home medications; start Eliquis on 08/04 - Mobilization as tolerated; PT ordered  - Discharge Planning; Given fever and decompensation this morning, will hold on discharge    All of the above findings and recommendations were discussed with the patient, patient's  family (husband at bedside), and the medical team, and all of patient's and family's questions were answered to their expressed satisfaction.  -- Edison Simon, PA-C [facility] 09/07/2021, 10:23 AM M-F: 7am - 4pm

## 2021-09-07 NOTE — Progress Notes (Signed)
09/07/21 2:47 PM  Appreciate medicine consult and input. Patient underwent CT Chest which showed large right pleural effusion. There is also some concern for possible esophageal leak. This was discussed with reading radiologist. Again, she had UGI yesterday which did not show a leak. Recommended repeating CT Chest with PO and IV contrast (Esophagram). This was ordered  Case also reviewed with IR (Dr Annamaria Boots) with request for drain placement in to right pleural effusion. Order placed.   Patient made NPO Started Zosyn  Above was discussed and agreed upon with Dr Dahlia Byes.   Patient was seen as well just following PT. Remains SOB but otherwise has done well. Abdominal examination is unchanged and reassuring. She is certainly not peritonitic nor toxic at this time. Discussed findings and POC with patient and her family at bedside. They are understanding. All questions answered.   -- Edison Simon, PA-C [facility] 09/07/2021, 2:50 PM M-F: 7am - 4pm

## 2021-09-08 ENCOUNTER — Inpatient Hospital Stay: Payer: Medicare Other

## 2021-09-08 DIAGNOSIS — R509 Fever, unspecified: Secondary | ICD-10-CM | POA: Diagnosis not present

## 2021-09-08 DIAGNOSIS — J449 Chronic obstructive pulmonary disease, unspecified: Secondary | ICD-10-CM | POA: Diagnosis not present

## 2021-09-08 DIAGNOSIS — I2510 Atherosclerotic heart disease of native coronary artery without angina pectoris: Secondary | ICD-10-CM

## 2021-09-08 DIAGNOSIS — I480 Paroxysmal atrial fibrillation: Secondary | ICD-10-CM

## 2021-09-08 DIAGNOSIS — J9601 Acute respiratory failure with hypoxia: Secondary | ICD-10-CM | POA: Diagnosis not present

## 2021-09-08 LAB — COMPREHENSIVE METABOLIC PANEL
ALT: 24 U/L (ref 0–44)
AST: 70 U/L — ABNORMAL HIGH (ref 15–41)
Albumin: 2.6 g/dL — ABNORMAL LOW (ref 3.5–5.0)
Alkaline Phosphatase: 71 U/L (ref 38–126)
Anion gap: 5 (ref 5–15)
BUN: 44 mg/dL — ABNORMAL HIGH (ref 8–23)
CO2: 21 mmol/L — ABNORMAL LOW (ref 22–32)
Calcium: 7.3 mg/dL — ABNORMAL LOW (ref 8.9–10.3)
Chloride: 106 mmol/L (ref 98–111)
Creatinine, Ser: 1.95 mg/dL — ABNORMAL HIGH (ref 0.44–1.00)
GFR, Estimated: 25 mL/min — ABNORMAL LOW (ref >60)
Glucose, Bld: 119 mg/dL — ABNORMAL HIGH (ref 70–99)
Potassium: 5.1 mmol/L (ref 3.5–5.1)
Sodium: 132 mmol/L — ABNORMAL LOW (ref 135–145)
Total Bilirubin: 1.4 mg/dL — ABNORMAL HIGH (ref 0.3–1.2)
Total Protein: 5.2 g/dL — ABNORMAL LOW (ref 6.5–8.1)

## 2021-09-08 LAB — CBC
HCT: 34.7 % — ABNORMAL LOW (ref 36.0–46.0)
Hemoglobin: 11.3 g/dL — ABNORMAL LOW (ref 12.0–15.0)
MCH: 33.2 pg (ref 26.0–34.0)
MCHC: 32.6 g/dL (ref 30.0–36.0)
MCV: 102.1 fL — ABNORMAL HIGH (ref 80.0–100.0)
Platelets: 118 10*3/uL — ABNORMAL LOW (ref 150–400)
RBC: 3.4 MIL/uL — ABNORMAL LOW (ref 3.87–5.11)
RDW: 13.8 % (ref 11.5–15.5)
WBC: 9.4 10*3/uL (ref 4.0–10.5)
nRBC: 0 % (ref 0.0–0.2)

## 2021-09-08 LAB — MAGNESIUM: Magnesium: 2 mg/dL (ref 1.7–2.4)

## 2021-09-08 LAB — PROTIME-INR
INR: 1.6 — ABNORMAL HIGH (ref 0.8–1.2)
Prothrombin Time: 18.5 seconds — ABNORMAL HIGH (ref 11.4–15.2)

## 2021-09-08 LAB — APTT: aPTT: 39 seconds — ABNORMAL HIGH (ref 24–36)

## 2021-09-08 LAB — HEPARIN LEVEL (UNFRACTIONATED): Heparin Unfractionated: 0.48 IU/mL (ref 0.30–0.70)

## 2021-09-08 MED ORDER — HEPARIN (PORCINE) 25000 UT/250ML-% IV SOLN
1250.0000 [IU]/h | INTRAVENOUS | Status: DC
  Administered 2021-09-08 – 2021-09-11 (×4): 1100 [IU]/h via INTRAVENOUS
  Administered 2021-09-12 – 2021-09-14 (×4): 1250 [IU]/h via INTRAVENOUS
  Filled 2021-09-08 (×8): qty 250

## 2021-09-08 MED ORDER — ALBUMIN HUMAN 25 % IV SOLN
25.0000 g | Freq: Once | INTRAVENOUS | Status: AC
Start: 2021-09-08 — End: 2021-09-08
  Administered 2021-09-08: 25 g via INTRAVENOUS
  Filled 2021-09-08: qty 100

## 2021-09-08 MED ORDER — DEXTROSE-NACL 5-0.9 % IV SOLN: INTRAVENOUS | Status: DC

## 2021-09-08 NOTE — Consult Note (Signed)
ANTICOAGULATION CONSULT NOTE   Pharmacy Consult for heparin drip Indication: atrial fibrillation  Allergies  Allergen Reactions   Baclofen Other (See Comments)   Codeine Nausea Only   Plavix [Clopidogrel]     brusing all over   Sulfa Antibiotics Nausea Only   Latex Rash    Patient Measurements: Height: '5\' 6"'$  (167.6 cm) Weight: 76.2 kg (168 lb) IBW/kg (Calculated) : 59.3 Heparin Dosing Weight: 74.7 kg  Vital Signs: Temp: 98 F (36.7 C) (08/04 1944) Temp Source: Oral (08/04 1615) BP: 114/65 (08/04 1944) Pulse Rate: 98 (08/04 1944)  Labs: Recent Labs    09/06/21 0456 09/07/21 1146 09/07/21 1444 09/07/21 1717 09/08/21 0550 09/08/21 1034 09/08/21 1908  HGB 11.9* 11.1*  --   --  11.3*  --   --   HCT 38.4 34.6*  --   --  34.7*  --   --   PLT 146* 130*  --   --  118*  --   --   APTT  --   --   --   --   --  39*  --   LABPROT  --   --   --   --   --  18.5*  --   INR  --   --   --   --   --  1.6*  --   HEPARINUNFRC  --   --   --   --   --   --  0.48  CREATININE 1.50* 1.56*  --   --  1.95*  --   --   TROPONINIHS  --   --  15 13  --   --   --      Estimated Creatinine Clearance: 22.8 mL/min (A) (by C-G formula based on SCr of 1.95 mg/dL (H)).   Medical History: Past Medical History:  Diagnosis Date   Adenomatous colon polyp    Anemia    Aortic atherosclerosis (HCC)    Arthritis    Barrett's esophagus    Bilateral carotid artery disease ([place])    a.) carotid doppler 07/18/2020: 8-65% RICA, 78-46% LICA; b.) carotid doppler 96/29/5284: 13-24% LICA, no sig RICA; c.) carotid doppler 01/23/2021: 4-01% RICA, 02-72% LICA; d.) carotid doppler 53/66/4403: 47-42% LICA, near norm RICA   CKD (chronic kidney disease), stage III (HCC)    COPD (chronic obstructive pulmonary disease) ([place])    Coronary artery disease 04/21/2004   a.) LHC 04/21/2004 [facility]): EF 66%, 50% dLCx, 75% pLCx, 75% oRCA, 50% pRCA --> transferred to [facility] for CVTS consult; b.) 3v CABG   Dyspnea on  exertion    GERD (gastroesophageal reflux disease)    Hiatal hernia    Hyperlipidemia    Hypertension    Long term current use of anticoagulant    a.) reduced dose apixaban   Macular degeneration    OSA on CPAP    PAD (peripheral artery disease) ([place]) 12/10/2016   a.) PTA of BILATERAL common iliac arteries --> 7 x 26 mm stents placed   PAF (paroxysmal atrial fibrillation) (HCC)    a.) CHA2DS2-VASc = 6 (age x 2, sex, HTN, vascular disease, T2DM);  b.) rate/rhythm maintained on oral metoprolol tartrate; chronically anticoagulated using dose reduced apixaban   Peripheral edema    PSVT (paroxysmal supraventricular tachycardia) ([place])    S/P CABG x 3 04/2004   Schatzki's ring    Scoliosis of thoracolumbar spine    Valvular heart disease    a.) TTE 04/20/2004: EF >55%, mild  MR; b.)  TTE 04/10/2021: EF 50-55%, LVH, reduced RV SF, RVE, moderate BAE, mild-mod MR, severe TR.    Medications:  PTA apixaban 2.5 mg BID, last dose 09/01/2021 Heparin 5,000 units SQ TID, last dose 09/08/2021 @ 0530  Assessment: Patient is a 46 YOF with PMH significant for COPD, obstructive sleep apnea, peripheral arterial disease, coronary artery disease status post CABG, stage III chronic kidney disease, status post recent repair of paraesophageal hernia (09/05/21). Presented to [facility] for  robotic repair of paraesophageal hernia. During hospitalization she developed SOB and fever. Currently POD4. Patient was on apixaban 2.5 mg BID PTA an was receiving heparin 5000 units SQ for DVT ppx inpatient. Pharmacy has been consulted for heparin drip (Afib).  Baseline labs: Hgb 11.3, Hct 34.7, Plt 118, aPTT 39, PT 18.5, INR 1.6  8/4 19:08    HL 0.48, at 1100 units/hr therapeutic x 1  Goal of Therapy:  Heparin level 0.3-0.7 units/ml Monitor platelets by anticoagulation protocol: Yes   Plan:  HL 0.48 is therapeutic x 1, will continue with current rate of 1100 units/hr Recheck HL in 8 hours for confirmation Daily CBC while on  Heparin drip  Paulina Fusi, PharmD, BCPS 09/08/2021 7:57 PM

## 2021-09-08 NOTE — Progress Notes (Signed)
Physical Therapy Treatment Patient Details Name: Christina [name] MRN: [id number] DOB: Oct 05, 1935 Today's Date: 09/08/2021   History of Present Illness Christina [name] is an 9yoF who comes to [facility] on 8/1 for scheduled surgery with Dr. Dahlia Byes to address her paraesophogeal hernia. Pt moving well post surgery able to AMB the unit with moblity team twice, then postop day 2, difficulty AMB, breathing, ABD pain- CT revealing of pleural effusion and IR was called for chest tube placement PM 8/3. PMH: CAD s/p CABG x 3, severe tricuspid regurg on echo 2019, COPD, HTN CKD 3a, PAD, chronic dyspnea.    PT Comments    Pt in room with husband and sister, agreeable to session. RN moves wall suction to water seal for moblity.. Pt on 2L/min , then moved to 0.5L/min durin gAMB without any frank dyspnea or desaturation. Pt able to performed several STS transfers safely with RW, then FWD/BWD AMB at bedside, and then progress AMB out of room to >354f at a steady pace without LOB, but still using RW at this time. Pt continues to have significant ABD swelling/bloating, pain well managed.     Recommendations for follow up therapy are one component of a multi-disciplinary discharge planning process, led by the attending physician.  Recommendations may be updated based on patient status, additional functional criteria and insurance authorization.  Follow Up Recommendations  Home health PT     Assistance Recommended at Discharge Frequent or constant Supervision/Assistance  Patient can return home with the following A little help with walking and/or transfers;A lot of help with bathing/dressing/bathroom;Assistance with cooking/housework;Assist for transportation   Equipment Recommendations  Rolling walker (2 wheels)    Recommendations for Other Services       Precautions / Restrictions Precautions Precautions: Fall Precaution Comments: R chest tube Restrictions Weight Bearing Restrictions: No      Mobility  Bed Mobility Overal bed mobility: Needs Assistance Bed Mobility: Supine to Sit, Sit to Supine     Supine to sit: Min assist, HOB elevated (requires 1 hand to pull self to sitting) Sit to supine: Min assist   General bed mobility comments: requires minA of both legs individually and +2 to slide up in bed    Transfers Overall transfer level: Needs assistance Equipment used: Rolling walker (2 wheels) Transfers: Sit to/from Stand Sit to Stand: Min guard, Supervision           General transfer comment: technique cues    Ambulation/Gait Ambulation/Gait assistance: Supervision Gait Distance (Feet): 360 Feet Assistive device: Rolling walker (2 wheels) Gait Pattern/deviations: Step-through pattern Gait velocity: 0.446m     General Gait Details: maintained on 0.5L/min with SpO2 at 91-94%   Stairs             Wheelchair Mobility    Modified Rankin (Stroke Patients Only)       Balance                                            Cognition Arousal/Alertness: Awake/alert Behavior During Therapy: WFL for tasks assessed/performed Overall Cognitive Status: Within Functional Limits for tasks assessed                                          Exercises Other Exercises Other Exercises: 5xSTS from elevated surface c  RW ( no LOB)    General Comments        Pertinent Vitals/Pain Pain Assessment Pain Assessment: 0-10 Pain Score: 5  Pain Location: ABD/chest tube Pain Descriptors / Indicators: Sore, Grimacing, Guarding Pain Intervention(s): Monitored during session, Premedicated before session, Repositioned    Home Living Family/patient expects to be discharged to:: Private residence Living Arrangements: Spouse/significant other Available Help at Discharge: Family;Available 24 hours/day Type of Home: House Home Access: Level entry       Home Layout: One level Home Equipment: Rollator (4 wheels);Other  (comment)      Prior Function            PT Goals (current goals can now be found in the care plan section) Acute Rehab PT Goals Patient Stated Goal: return to home on Monday PT Goal Formulation: With patient Time For Goal Achievement: 09/21/21 Potential to Achieve Goals: Good Progress towards PT goals: Progressing toward goals    Frequency           PT Plan Current plan remains appropriate    Co-evaluation              AM-PAC PT "6 Clicks" Mobility   Outcome Measure  Help needed turning from your back to your side while in a flat bed without using bedrails?: A Lot Help needed moving from lying on your back to sitting on the side of a flat bed without using bedrails?: A Lot Help needed moving to and from a bed to a chair (including a wheelchair)?: A Little Help needed standing up from a chair using your arms (e.g., wheelchair or bedside chair)?: A Little Help needed to walk in hospital room?: A Little Help needed climbing 3-5 steps with a railing? : A Little 6 Click Score: 16    End of Session Equipment Utilized During Treatment: Gait belt Activity Tolerance: Patient tolerated treatment well;No increased pain Patient left: in bed;with family/visitor present;with nursing/sitter in room Nurse Communication: Mobility status PT Visit Diagnosis: Other abnormalities of gait and mobility (R26.89);Muscle weakness (generalized) (M62.81);Difficulty in walking, not elsewhere classified (R26.2);Pain;Unsteadiness on feet (R26.81) Pain - part of body:  (ABD)     Time: 8101-7510 PT Time Calculation (min) (ACUTE ONLY): 54 min  Charges:  $Therapeutic Exercise: 53-67 mins           3:31 PM, 09/08/21 Etta Grandchild, PT, DPT Physical Therapist - [facility]  [phone] ([place])     [place] C 09/08/2021, 3:29 PM

## 2021-09-08 NOTE — Progress Notes (Signed)
  Progress Note   Patient: Christina [name] HCW:[id number] DOB: 27-Jun-1935 DOA: 09/05/2021     1 DOS: the patient was seen and examined on 09/08/2021   Brief hospital course:  86 y.o. female with past medical history significant for COPD, obstructive sleep apnea, peripheral arterial disease, coronary artery disease status post CABG, stage III chronic kidney disease, status post recent repair of paraesophageal hernia (09/05/21), right pleural effusion s/p chest tube placement on 8/3 with some concern for possible esophageal injury/leak. Medical consult requested for evaluation of worsening shortness of breath from her baseline associated with a low-grade fever (Tmax of 100.5)  8/4: Stopped metoprolol as patient is hypotensive.  Chest tube to suction   Assessment and Plan: * Fever Patient with a low-grade fever, Tmax of 100.5 F. Etiology of fever could be secondary to atelectasis versus pneumonia Chest x-ray shows diffuse bilateral interstitial pulmonary opacity concerning for edema in the setting of cardiomegaly. CT chest shows improvement on the right hemithorax after chest tube placement Negative blood cultures and respiratory viral panel as well  COPD (chronic obstructive pulmonary disease) (HCC) Stable and not acutely exacerbated Continue as needed bronchodilator therapy as well as inhaled steroids.  Acute respiratory failure with hypoxia (HCC) Patient endorsing shortness of breath when compared to her baseline on room air pulse oximetry of 86% at rest currently on 2 L with improvement in her pulse oximetry to 94%. Acute respiratory failure likely secondary to atelectasis versus pneumonia versus pulmonary edema Continue oxygen supplementation to maintain pulse oximetry greater than 92.  Wean oxygen as able  PAF (paroxysmal atrial fibrillation) (HCC) Holding metoprolol due to hypotension Apixaban was placed on hold for planned surgery.  On heparin drip for now  S/P repair of  paraesophageal hernia Further treatment plans per surgery  Primary hypertension Patient is hypotensive.  Hold metoprolol  Stage 3a chronic kidney disease ([place]) Renal function is slowly trending up, monitor closely.  Consider nephro consult if it continues to get worse.  Acute worsening could be due to hypotension leading to ATN  Lab Results  Component Value Date   CREATININE 1.95 (H) 09/08/2021   CREATININE 1.56 (H) 09/07/2021   CREATININE 1.50 (H) 09/06/2021    Coronary artery disease Hold metoprolol due to hypotension We will resume statins when appropriate        Subjective: Feeling somewhat better since chest tube placement.  She wants to eat  Physical Exam: Vitals:   09/08/21 0239 09/08/21 0420 09/08/21 0741 09/08/21 0822  BP: (!) 81/55 (!) 97/54  (!) 81/55  Pulse: 73 85  76  Resp: '17 15  20  '$ Temp: 98 F (36.7 C)   97.6 F (36.4 C)  TempSrc: Oral     SpO2: 96% 96% 94% 96%  Weight:      Height:       86 year old female lying in the bed comfortably without any acute distress Lungs decreased breath sounds at the right base.  Chest tube to the right lateral chest wall drainage is serosanguineous.  No air leak Cardiovascular regular rate and rhythm Abdomen soft, incisional tenderness.  No rebound or guarding Neuro alert and oriented, nonfocal Psych normal mood and affect Data Reviewed:  Creatinine slowly trending up.  Hemoglobin 11.3, sodium 132  Family Communication: None  DVT prophylaxis-heparin drip  Time spent: 15 minutes  Author: Max Sane, MD 09/08/2021 1:53 PM  For on call review [email or web address].

## 2021-09-08 NOTE — Evaluation (Signed)
Occupational Therapy Evaluation Patient Details Name: Christina [name] MRN: [id number] DOB: [birth year] Today's Date: 09/08/2021   History of Present Illness Christina [name] is a 86 y.o. female medical history significant for coronary artery disease, COPD, pretension.  She Is being referred for evaluation of paraesophageal hernia. Post-Op s/p robotic assisted laparoscopic paraesophageal hernia repair with Nissen fundoplication 10/10/60   Clinical Impression   Pt was seen for OT evaluation this date. Prior to hospital admission, pt was generally independent, only occasionally using a rollator for longer community distances. Pt lives with her spouse in a level entry home. Currently pt demonstrates impairments as described below (See OT problem list) which functionally limit her ability to perform ADL/self-care tasks. Pt currently requires MIN A for bed mobility, MIN A for LB ADL, CGA for ADL transfers, and supv-CGA for standing grooming tasks at the sink. Pt endorsed mild fatigue afterwards and 5/10 abdominal pain during session.  Pt would benefit from skilled OT services to address noted impairments and functional limitations (see below for any additional details) in order to maximize safety and independence while minimizing falls risk and caregiver burden. Upon hospital discharge, recommend HHOT to maximize pt safety and return to functional independence during meaningful occupations of daily life.     Recommendations for follow up therapy are one component of a multi-disciplinary discharge planning process, led by the attending physician.  Recommendations may be updated based on patient status, additional functional criteria and insurance authorization.   Follow Up Recommendations  Home health OT    Assistance Recommended at Discharge Frequent or constant Supervision/Assistance  Patient can return home with the following A little help with walking and/or transfers;A little help with  bathing/dressing/bathroom;Assistance with cooking/housework;Assist for transportation;Help with stairs or ramp for entrance;Direct supervision/assist for medications management    Functional Status Assessment  Patient has had a recent decline in their functional status and demonstrates the ability to make significant improvements in function in a reasonable and predictable amount of time.  Equipment Recommendations  BSC/3in1    Recommendations for Other Services       Precautions / Restrictions Precautions Precautions: Fall Precaution Comments: R chest tube Restrictions Weight Bearing Restrictions: No      Mobility Bed Mobility Overal bed mobility: Needs Assistance Bed Mobility: Supine to Sit, Sit to Supine     Supine to sit: Min assist, HOB elevated Sit to supine: Supervision        Transfers Overall transfer level: Needs assistance Equipment used:  (reached for sink/counter to pull up, per pt's request) Transfers: Sit to/from Stand Sit to Stand: Min guard                  Balance Overall balance assessment: Needs assistance Sitting-balance support: Feet supported Sitting balance-Leahy Scale: Fair     Standing balance support: Single extremity supported, During functional activity Standing balance-Leahy Scale: Fair                             ADL either performed or assessed with clinical judgement   ADL                                         General ADL Comments: Pt requires MIN A for LB ADL tasks, CGA for ADL transfers, and set up and supv-CGA for standing grooming tasks at the sink.  Vision         Perception     Praxis      Pertinent Vitals/Pain Pain Assessment Pain Assessment: 0-10 Pain Score: 5  Pain Location: abdomen Pain Descriptors / Indicators: Sore, Grimacing, Guarding Pain Intervention(s): Limited activity within patient's tolerance, Monitored during session, Repositioned     Hand Dominance      Extremity/Trunk Assessment Upper Extremity Assessment Upper Extremity Assessment: Generalized weakness   Lower Extremity Assessment Lower Extremity Assessment: Generalized weakness   Cervical / Trunk Assessment Cervical / Trunk Assessment: Normal   Communication Communication Communication: No difficulties   Cognition Arousal/Alertness: Awake/alert Behavior During Therapy: WFL for tasks assessed/performed Overall Cognitive Status: Within Functional Limits for tasks assessed                                       General Comments       Exercises     Shoulder Instructions      Home Living Family/patient expects to be discharged to:: Private residence Living Arrangements: Spouse/significant other Available Help at Discharge: Family;Available 24 hours/day Type of Home: House Home Access: Level entry     Home Layout: One level     Bathroom Shower/Tub: Walk-in shower;Tub only   Bathroom Toilet: Handicapped height     Home Equipment: Rollator (4 wheels);Other (comment)          Prior Functioning/Environment Prior Level of Function : Independent/Modified Independent;Driving             Mobility Comments: uses rollator for community distances recently ADLs Comments: limited driving 2/2 macular degeneration        OT Problem List: Decreased strength;Decreased activity tolerance;Impaired balance (sitting and/or standing);Decreased knowledge of use of DME or AE      OT Treatment/Interventions: Self-care/ADL training;Therapeutic exercise;Therapeutic activities;Energy conservation;DME and/or AE instruction;Patient/family education;Balance training    OT Goals(Current goals can be found in the care plan section) Acute Rehab OT Goals Patient Stated Goal: go home OT Goal Formulation: With patient Time For Goal Achievement: 09/22/21 Potential to Achieve Goals: Good ADL Goals Pt Will Perform Lower Body Dressing: with modified independence;sit  to/from stand;with adaptive equipment Pt Will Transfer to Toilet: with supervision;ambulating (elevated commode, LRAD) Additional ADL Goal #1: Pt will complete bed mobiltiy with supervision. Additional ADL Goal #2: Pt will complete all aspects of a shower primarily from seated position with supervision for safety.  OT Frequency: Min 2X/week    Co-evaluation              AM-PAC OT "6 Clicks" Daily Activity     Outcome Measure Help from another person eating meals?: None Help from another person taking care of personal grooming?: A Little Help from another person toileting, which includes using toliet, bedpan, or urinal?: A Little Help from another person bathing (including washing, rinsing, drying)?: A Little Help from another person to put on and taking off regular upper body clothing?: None Help from another person to put on and taking off regular lower body clothing?: A Little 6 Click Score: 20   End of Session Equipment Utilized During Treatment: Oxygen Nurse Communication: Mobility status  Activity Tolerance: Patient tolerated treatment well Patient left: in bed;with call bell/phone within reach;with bed alarm set  OT Visit Diagnosis: Other abnormalities of gait and mobility (R26.89);Muscle weakness (generalized) (M62.81)                Time: 1125-1150 OT  Time Calculation (min): 25 min Charges:  OT General Charges $OT Visit: 1 Visit OT Evaluation $OT Eval Moderate Complexity: 1 Mod OT Treatments $Self Care/Home Management : 8-22 mins  Ardeth Perfect., MPH, MS, OTR/L ascom [phone] 09/08/21, 1:45 PM

## 2021-09-08 NOTE — Progress Notes (Addendum)
[place] [facility] Day(s): 1.   Post op day(s): 3 Days Post-Op.   Interval History:  Patient seen and examined No acute events or new complaints overnight.  Patient reports she feels her breathing is improved s/p chest tube placements Abdomen remains sore No fever, chills, nausea, emesis She remains without leukocytosis: WBC 9.4K Hgb is stable 11.3 She does have worsening AKI; sCr - 1.95; UO - unmeasured. Likely secondary to need for IV contrast yesterday  Chest drain output: 1500 ccs; serosanguinous; no air leak  She is NPO; is hungry  Continues on Zosyn   Vital signs in last 24 hours: [min-max] current  Temp:  [98 F (36.7 C)-100.5 F (38.1 C)] 98 F (36.7 C) (08/04 0239) Pulse Rate:  [60-85] 85 (08/04 0420) Resp:  [15-29] 15 (08/04 0420) BP: (81-114)/(43-66) 97/54 (08/04 0420) SpO2:  [86 %-96 %] 96 % (08/04 0420)     Height: '5\' 6"'$  (167.6 cm) Weight: 76.2 kg BMI (Calculated): 27.13   Intake/Output last 2 shifts:  08/03 0701 - 08/04 0700 In: 300 [IV Piggyback:300] Out: -    Physical Exam:  Constitutional: alert, cooperative and no distress  Respiratory: breathing non-labored at rest; on [place] Chest: Chest tube to right lateral chest wall; site is CDI; output serosanguinous; no air leal  Cardiovascular: regular rate and sinus rhythm  Gastrointestinal: Soft, incisional soreness, distension and tympany over the stomach, no rebound/guarding. Integumentary: Laparoscopic incisions are CDI with dermabond, no erythema or drainage, some ecchymosis   Labs:     Latest Ref Rng & Units 09/08/2021    5:50 AM 09/07/2021   11:46 AM 09/06/2021    4:56 AM  CBC  WBC 4.0 - 10.5 K/uL 9.4  8.4  7.0   Hemoglobin 12.0 - 15.0 g/dL 11.3  11.1  11.9   Hematocrit 36.0 - 46.0 % 34.7  34.6  38.4   Platelets 150 - 400 K/uL 118  130  146       Latest Ref Rng & Units 09/08/2021    5:50 AM 09/07/2021   11:46 AM 09/06/2021    4:56 AM  CMP  Glucose 70 - 99 mg/dL  119  96  130   BUN 8 - 23 mg/dL 44  35  29   Creatinine 0.44 - 1.00 mg/dL 1.95  1.56  1.50   Sodium 135 - 145 mmol/L 132  133  139   Potassium 3.5 - 5.1 mmol/L 5.1  4.5  4.6   Chloride 98 - 111 mmol/L 106  105  108   CO2 22 - 32 mmol/L '21  21  25   '$ Calcium 8.9 - 10.3 mg/dL 7.3  7.7  8.0   Total Protein 6.5 - 8.1 g/dL 5.2     Total Bilirubin 0.3 - 1.2 mg/dL 1.4     Alkaline Phos 38 - 126 U/L 71     AST 15 - 41 U/L 70     ALT 0 - 44 U/L 24        Imaging studies:   Repeat CT Chest with PO/IV contrast (09/07/2021):  IMPRESSION: No evidence of esophageal leak. Unchanged trace mediastinal gas and small area of hyperattenuation within the lower mediastinum as seen on recent CT, likely sequela of recent surgery.  Significantly decreased right pleural effusion after chest tube placement. Small residual hydropneumothorax with predominant gas component, and residual atelectasis in the right lung base.   Stable small left pleural effusion.   Postsurgical changes in the  upper abdomen related to recent Nissen fundoplication.   These results were discussed via telephone at the time of interpretation on 09/07/2021 at 5:10 pm with Caroleen Hamman MD, who verbally acknowledged these results.    Assessment/Plan:  86 y.o. female with right pleural effusion s/p chest tube placement (08/03) and concern for possible esophageal injury although work up without evidence of this 3 Days Post-Op s/p robotic assisted laparoscopic paraesophageal hernia repair with Nissen fundoplication.   - For now, we will continue to manage her as though she has a leak out of precaution although there is no evidence of such on imaging. Continue NPO throughout the weekend. We will plan on UGI on Monday before restarting diet.     - Continue chest tube to suction; -20 cm. Monitor and record output. I will repeat CXR this morning. If lung is inflated, we can place this to water seal   - Continue IVF support; will give albumin -  Continue IV Abx (Zosyn) - Monitor abdominal examination   - Anticoagulation; Okay for heparin drip wWITHOUT bolus today   - Pain control prn    - Mobilization; PT on board; recommending HHPT  - Appreciate medicine assistance   All of the above findings and recommendations were discussed with the patient, and the medical team, and all of patient's questions were answered to her expressed satisfaction.  -- Edison Simon, PA-C [facility] 09/08/2021, 7:19 AM M-F: 7am - 4pm

## 2021-09-08 NOTE — Consult Note (Signed)
ANTICOAGULATION CONSULT NOTE - Initial Consult  Pharmacy Consult for heparin drip Indication: atrial fibrillation  Allergies  Allergen Reactions   Baclofen Other (See Comments)   Codeine Nausea Only   Plavix [Clopidogrel]     brusing all over   Sulfa Antibiotics Nausea Only   Latex Rash    Patient Measurements: Height: '5\' 6"'$  (167.6 cm) Weight: 76.2 kg (168 lb) IBW/kg (Calculated) : 59.3 Heparin Dosing Weight: 74.7 kg  Vital Signs: Temp: 97.6 F (36.4 C) (08/04 0822) Temp Source: Oral (08/04 0239) BP: 81/55 (08/04 0822) Pulse Rate: 76 (08/04 0822)  Labs: Recent Labs    09/06/21 0456 09/07/21 1146 09/07/21 1444 09/07/21 1717 09/08/21 0550  HGB 11.9* 11.1*  --   --  11.3*  HCT 38.4 34.6*  --   --  34.7*  PLT 146* 130*  --   --  118*  CREATININE 1.50* 1.56*  --   --  1.95*  TROPONINIHS  --   --  15 13  --     Estimated Creatinine Clearance: 22.8 mL/min (A) (by C-G formula based on SCr of 1.95 mg/dL (H)).   Medical History: Past Medical History:  Diagnosis Date   Adenomatous colon polyp    Anemia    Aortic atherosclerosis (HCC)    Arthritis    Barrett's esophagus    Bilateral carotid artery disease ([place])    a.) carotid doppler 07/18/2020: 6-26% RICA, 94-85% LICA; b.) carotid doppler 46/27/0350: 09-38% LICA, no sig RICA; c.) carotid doppler 01/23/2021: 1-82% RICA, 99-37% LICA; d.) carotid doppler 16/96/7893: 81-01% LICA, near norm RICA   CKD (chronic kidney disease), stage III (HCC)    COPD (chronic obstructive pulmonary disease) ([place])    Coronary artery disease 04/21/2004   a.) LHC 04/21/2004 [facility]): EF 66%, 50% dLCx, 75% pLCx, 75% oRCA, 50% pRCA --> transferred to [facility] for CVTS consult; b.) 3v CABG   Dyspnea on exertion    GERD (gastroesophageal reflux disease)    Hiatal hernia    Hyperlipidemia    Hypertension    Long term current use of anticoagulant    a.) reduced dose apixaban   Macular degeneration    OSA on CPAP    PAD (peripheral artery  disease) ([place]) 12/10/2016   a.) PTA of BILATERAL common iliac arteries --> 7 x 26 mm stents placed   PAF (paroxysmal atrial fibrillation) (HCC)    a.) CHA2DS2-VASc = 6 (age x 2, sex, HTN, vascular disease, T2DM);  b.) rate/rhythm maintained on oral metoprolol tartrate; chronically anticoagulated using dose reduced apixaban   Peripheral edema    PSVT (paroxysmal supraventricular tachycardia) ([place])    S/P CABG x 3 04/2004   Schatzki's ring    Scoliosis of thoracolumbar spine    Valvular heart disease    a.) TTE 04/20/2004: EF >55%, mild MR; b.)  TTE 04/10/2021: EF 50-55%, LVH, reduced RV SF, RVE, moderate BAE, mild-mod MR, severe TR.    Medications:  PTA apixaban 2.5 mg BID, last dose 09/01/2021 Heparin 5,000 units SQ TID, last dose 09/08/2021 @ 0530  Assessment: Patient is a 39 YOF with PMH significant for COPD, obstructive sleep apnea, peripheral arterial disease, coronary artery disease status post CABG, stage III chronic kidney disease, status post recent repair of paraesophageal hernia (09/05/21). Presented to [facility] for  robotic repair of paraesophageal hernia. During hospitalization she developed SOB and fever. Currently POD4. Patient was on apixaban 2.5 mg BID PTA an was receiving heparin 5000 units SQ for DVT ppx inpatient. Pharmacy  has been consulted for heparin drip (Afib).  Baseline labs: Hgb 11.3, Hct 34.7, Plt 118, aPTT 39, PT 18.5, INR 1.6  Goal of Therapy:  Heparin level 0.3-0.7 units/ml Monitor platelets by anticoagulation protocol: Yes   Plan:  Start heparin infusion at 1100 units/hr Check anti-Xa level in 8 hours and daily while on heparin Continue to monitor H&H and platelets  Gretel Acre, PharmD PGY1 Pharmacy Resident 09/08/2021 10:35 AM

## 2021-09-08 NOTE — Hospital Course (Addendum)
86 y.o. female with past medical history significant for COPD, obstructive sleep apnea, peripheral arterial disease, coronary artery disease status post CABG, stage III chronic kidney disease, status post recent repair of paraesophageal hernia (09/05/21), right pleural effusion s/p chest tube placement on 8/3 with some concern for possible esophageal injury/leak. Medical consult requested for evaluation of worsening shortness of breath from her baseline associated with a low-grade fever (Tmax of 100.5)  8/4: Stopped metoprolol as patient is hypotensive.  Chest tube to suction 8/5: Chest x-ray unchanged. 8/6: Echo shows normal LV systolic function and grade 1 diastolic dysfunction.  Blood pressure and heart rate trending up so metoprolol restarted.  Kidney function and platelets improving 8/7: Chest tube to waterseal, clear liquid diet started per primary team 8/8: Full liquid diet today and advance to soft per primary team.  TRH will sign off, please call with any questions.  Discussed with [facility]

## 2021-09-09 ENCOUNTER — Inpatient Hospital Stay: Payer: Medicare Other

## 2021-09-09 DIAGNOSIS — I10 Essential (primary) hypertension: Secondary | ICD-10-CM | POA: Diagnosis not present

## 2021-09-09 DIAGNOSIS — N1831 Chronic kidney disease, stage 3a: Secondary | ICD-10-CM

## 2021-09-09 DIAGNOSIS — R509 Fever, unspecified: Secondary | ICD-10-CM | POA: Diagnosis not present

## 2021-09-09 DIAGNOSIS — D696 Thrombocytopenia, unspecified: Secondary | ICD-10-CM | POA: Diagnosis present

## 2021-09-09 DIAGNOSIS — J9601 Acute respiratory failure with hypoxia: Secondary | ICD-10-CM | POA: Diagnosis not present

## 2021-09-09 LAB — CBC
HCT: 30.6 % — ABNORMAL LOW (ref 36.0–46.0)
Hemoglobin: 10 g/dL — ABNORMAL LOW (ref 12.0–15.0)
MCH: 33.7 pg (ref 26.0–34.0)
MCHC: 32.7 g/dL (ref 30.0–36.0)
MCV: 103 fL — ABNORMAL HIGH (ref 80.0–100.0)
Platelets: 110 10*3/uL — ABNORMAL LOW (ref 150–400)
RBC: 2.97 MIL/uL — ABNORMAL LOW (ref 3.87–5.11)
RDW: 14 % (ref 11.5–15.5)
WBC: 4.5 10*3/uL (ref 4.0–10.5)
nRBC: 0.7 % — ABNORMAL HIGH (ref 0.0–0.2)

## 2021-09-09 LAB — MAGNESIUM: Magnesium: 2.2 mg/dL (ref 1.7–2.4)

## 2021-09-09 LAB — PHOSPHORUS: Phosphorus: 3.9 mg/dL (ref 2.5–4.6)

## 2021-09-09 LAB — BASIC METABOLIC PANEL
Anion gap: 7 (ref 5–15)
BUN: 48 mg/dL — ABNORMAL HIGH (ref 8–23)
CO2: 20 mmol/L — ABNORMAL LOW (ref 22–32)
Calcium: 7.2 mg/dL — ABNORMAL LOW (ref 8.9–10.3)
Chloride: 109 mmol/L (ref 98–111)
Creatinine, Ser: 1.85 mg/dL — ABNORMAL HIGH (ref 0.44–1.00)
GFR, Estimated: 27 mL/min — ABNORMAL LOW (ref >60)
Glucose, Bld: 104 mg/dL — ABNORMAL HIGH (ref 70–99)
Potassium: 4 mmol/L (ref 3.5–5.1)
Sodium: 136 mmol/L (ref 135–145)

## 2021-09-09 LAB — HEPARIN LEVEL (UNFRACTIONATED): Heparin Unfractionated: 0.49 IU/mL (ref 0.30–0.70)

## 2021-09-09 MED ORDER — ACETAMINOPHEN 10 MG/ML IV SOLN
1000.0000 mg | Freq: Four times a day (QID) | INTRAVENOUS | Status: AC
  Administered 2021-09-10 (×4): 1000 mg via INTRAVENOUS
  Filled 2021-09-09 (×4): qty 100

## 2021-09-09 NOTE — Assessment & Plan Note (Signed)
Patient endorsing shortness of breath when compared to her baseline on room air pulse oximetry of 86% at rest currently on 2 L with improvement in her pulse oximetry to 94%. Acute respiratory failure likely secondary to atelectasis versus pneumonia versus pulmonary edema Continue oxygen supplementation to maintain pulse oximetry greater than 92.  Wean oxygen as able. Chest x-ray is unchanged.  Getting echo

## 2021-09-09 NOTE — Progress Notes (Signed)
CC: s/p paraesophageal H Subjective: Doing better, some abd soreness CT close to 2 lts Cxr no ptx Creat improving  Objective: Vital signs in last 24 hours: Temp:  [97.7 F (36.5 C)-98 F (36.7 C)] 97.8 F (36.6 C) (08/05 0751) Pulse Rate:  [55-98] 55 (08/05 0751) Resp:  [18] 18 (08/05 0751) BP: (95-114)/(49-65) 110/60 (08/05 0751) SpO2:  [95 %-98 %] 98 % (08/05 0751) Last BM Date : 09/08/21  Intake/Output from previous day: 08/04 0701 - 08/05 0700 In: 1088.6 [I.V.:644.3; IV Piggyback:444.4] Out: 2370 [Urine:400; Chest Tube:1970] Intake/Output this shift: No intake/output data recorded.  Physical exam:  NAD alert Chest: decrease bs bilateral, CT w serous flui no air leak Abd: soft, mildly distended, no peritonitis  Lab Results: CBC  Recent Labs    09/08/21 0550 09/09/21 0410  WBC 9.4 4.5  HGB 11.3* 10.0*  HCT 34.7* 30.6*  PLT 118* 110*   BMET Recent Labs    09/08/21 0550 09/09/21 0410  NA 132* 136  K 5.1 4.0  CL 106 109  CO2 21* 20*  GLUCOSE 119* 104*  BUN 44* 48*  CREATININE 1.95* 1.85*  CALCIUM 7.3* 7.2*   PT/INR Recent Labs    09/08/21 1034  LABPROT 18.5*  INR 1.6*   ABG No results for input(s): "PHART", "HCO3" in the last 72 hours.  Invalid input(s): "PCO2", "PO2"  Studies/Results: DG CHEST PORT 1 VIEW  Result Date: 09/09/2021 CLINICAL DATA:  Recent hiatal hernia surgery. EXAM: PORTABLE CHEST 1 VIEW COMPARISON:  09/08/2021 FINDINGS: The cardio pericardial silhouette is enlarged. Stable fullness right hilar region. Interstitial markings are diffusely coarsened with chronic features. Right pleural drain again noted. IMPRESSION: No interval change in exam. No new or progressive findings. Electronically Signed   By: Misty Stanley M.D.   On: 09/09/2021 10:09   DG ABD ACUTE 2+V W 1V CHEST  Result Date: 09/08/2021 CLINICAL DATA:  Three days postop repair of paraesophageal hernia EXAM: DG ABDOMEN ACUTE WITH 1 VIEW CHEST COMPARISON:  Chest  radiograph and chest CT 1 day prior, CT abdomen/pelvis 05/19/2021 FINDINGS: Chest: The median sternotomy wires are unchanged. The cardiomediastinal silhouette is stable. A right basilar chest tube remains in place. Consolidative opacities are again seen in the medial lung bases with a small left pleural effusion, grossly similar to the prior CT. The upper lungs well aerated. There is no significant right pleural effusion. The small pneumothorax seen on the prior CT is not appreciated on the current study. There is no left pneumothorax Abdomen: There is mild gaseous distention of the small and large bowel in the abdomen without evidence of mechanical obstruction. Enteric contrast is seen in the colon and rectum. There is no evidence of free intraperitoneal air. Scoliotic curvature and multilevel degenerative changes in the spine are again seen. IMPRESSION: 1. Small left pleural effusion and bibasilar airspace consolidation, grossly similar to the prior CT chest. 2. Right basilar chest tube in place. The small pneumothorax seen on the prior CT chest is not appreciated on the current study. No significant residual pleural effusion. 3. Mild gaseous distention of the bowel without evidence of mechanical obstruction. Enteric contrast is seen in the rectum. Electronically Signed   By: Valetta Mole M.D.   On: 09/08/2021 10:35   CT CHEST W CONTRAST  Result Date: 09/07/2021 CLINICAL DATA:  Esophageal perforation Potential Esophageal Leak; Rule out EXAM: CT CHEST WITH CONTRAST TECHNIQUE: Multidetector CT imaging of the chest was performed during intravenous contrast administration. RADIATION DOSE REDUCTION: This exam  was performed according to the departmental dose-optimization program which includes automated exposure control, adjustment of the mA and/or kV according to patient size and/or use of iterative reconstruction technique. CONTRAST:  72m OMNIPAQUE IOHEXOL 350 MG/ML SOLN COMPARISON:  Same day CT chest. FINDINGS:  Cardiovascular: Unchanged cardiomegaly. Prior CABG with calcifications of the native coronary arteries.There is moderate atherosclerosis of the thoracic aortic arch. Mediastinum/Nodes: No lymphadenopathy.The thyroid is unremarkable.Oral contrast was administered.There is no evidence of contrast leak from the esophagus. There is a persistent small area of hyperattenuation unchanged from recent noncontrast chest CT within the lower right aspect of the mediastinum (series 2, image 95). There is as a small amount of mediastinal gas in the lower mediastinum related to recent Nissen fundoplication. Lungs/Pleura: Significantly decreased size of the right pleural effusion after basilar chest tube placement. There is a small residual hydropneumothorax, with prominent gas component. There is residual segmental atelectasis in the right lower lobe. There is a small left pleural effusion with adjacent basilar atelectasis. Unchanged perifissural nodules on the right, no dedicated follow-up recommended. Upper Abdomen: Postsurgical changes of Nissen fundoplication with mildly edematous appearance and postsurgical stranding. There is no focal fluid collection the upper abdomen. Trace free gas in the upper abdomen consistent with postoperative pneumoperitoneum. Musculoskeletal: No acute osseous abnormality. Prior median sternotomy.No suspicious osseous lesion. Multilevel degenerative changes of the spine. IMPRESSION: No evidence of esophageal leak. Unchanged trace mediastinal gas and small area of hyperattenuation within the lower mediastinum as seen on recent CT, likely sequela of recent surgery. Significantly decreased right pleural effusion after chest tube placement. Small residual hydropneumothorax with predominant gas component, and residual atelectasis in the right lung base. Stable small left pleural effusion. Postsurgical changes in the upper abdomen related to recent Nissen fundoplication. These results were discussed via  telephone at the time of interpretation on 09/07/2021 at 5:10 pm with DCaroleen HammanMD, who verbally acknowledged these results. Electronically Signed   By: JMaurine SimmeringM.D.   On: 09/07/2021 17:29   CT PERC PLEURAL DRAIN W/INDWELL CATH W/IMG GUIDE  Result Date: 09/07/2021 INDICATION: Shortness of breath, postop large right effusion EXAM: CT 14 FRENCH RIGHT CHEST TUBE INSERTION MEDICATIONS: The patient is currently admitted to the hospital and receiving intravenous antibiotics. The antibiotics were administered within an appropriate time frame prior to the initiation of the procedure. ANESTHESIA/SEDATION: Moderate (conscious) sedation was employed during this procedure. A total of Versed 0 mg and Fentanyl 25 mcg was administered intravenously by the radiology nurse. Total intra-service moderate Sedation Time: None. The patient's level of consciousness and vital signs were monitored continuously by radiology nursing throughout the procedure under my direct supervision. COMPLICATIONS: None immediate. PROCEDURE: Informed written consent was obtained from the patient after a thorough discussion of the procedural risks, benefits and alternatives. All questions were addressed. Maximal Sterile Barrier Technique was utilized including caps, mask, sterile gowns, sterile gloves, sterile drape, hand hygiene and skin antiseptic. A timeout was performed prior to the initiation of the procedure. Previous imaging reviewed. Patient positioned right anterior oblique. Noncontrast localization CT performed. A posterior lower intercostal space was marked for access. Under sterile conditions and local anesthesia, a 18 gauge 15 cm access was advanced into the pleural space. Needle position confirmed with CT. Syringe aspiration yielded thin blood tinged pleural fluid. Guidewire inserted followed by tract dilatation insert a 14 French drain. Drain catheter position confirmed with CT. Syringe aspiration yielded additional blood tinged pleural  fluid. Sample sent for Gram stain and culture. Catheter secured with  a silk suture and a sterile dressing. Pleura vac device connected. Patient tolerated the procedure well.  No immediate complication. IMPRESSION: Successful CT-guided 14 French right chest tube insertion. Electronically Signed   By: Jerilynn Mages.  Shick M.D.   On: 09/07/2021 16:59   CT CHEST WO CONTRAST  Result Date: 09/07/2021 CLINICAL DATA:  Pneumonia EXAM: CT CHEST WITHOUT CONTRAST TECHNIQUE: Multidetector CT imaging of the chest was performed following the standard protocol without IV contrast. RADIATION DOSE REDUCTION: This exam was performed according to the departmental dose-optimization program which includes automated exposure control, adjustment of the mA and/or kV according to patient size and/or use of iterative reconstruction technique. COMPARISON:  Chest x-ray dated September 07, 2021; CT abdomen and pelvis dated May 19, 2021; leak study dated September 06, 2021 FINDINGS: Cardiovascular: Cardiomegaly. No pericardial effusion. Severe left main and three-vessel coronary artery calcifications status post CABG. Normal caliber thoracic aorta with moderate calcified plaque. Mediastinum/Nodes: Thyroid is unremarkable. Calcified left hilar lymph nodes. No pathologically enlarged lymph nodes seen in the chest. Lungs/Pleura: Central airways are patent. Moderate right and small left pleural effusions with associated atelectasis. Focal area of hyperdensity is seen within the right pleural effusion adjacent to the lower esophagus on series 2, image 108. Calcified granuloma of the left lower lobe. Small solid perifissural nodules are seen in the right middle lobe, likely intrapulmonary lymph nodes, no follow-up imaging is recommended. Upper Abdomen: Postsurgical changes of prior Nissen fundoplication. Small locule of extraluminal air is seen anterior to the liver, likely postsurgical. Musculoskeletal: Prior median sternotomy with intact sternal wires. No chest  wall mass or suspicious bone lesions identified. IMPRESSION: 1. Moderate right pleural effusion with focal area of hyperdensity seen within the right pleural effusion adjacent to the expected area of the lower esophagus, finding may be due to a slipped portion of the wrap or contained leak. Recommend CT esophagram with oral and IV contrast for further evaluation. 2. Trace right pneumothorax. 3. Small left pleural effusion. Critical Value/emergent results were called by telephone at the time of interpretation on 09/07/2021 at 1:52 pm to provider Delena Bali, [place], who verbally acknowledged these results. Electronically Signed   By: Yetta Glassman M.D.   On: 09/07/2021 13:55    Anti-infectives: Anti-infectives (From admission, onward)    Start     Dose/Rate Route Frequency Ordered Stop   09/07/21 1445  piperacillin-tazobactam (ZOSYN) IVPB 3.375 g        3.375 g 12.5 mL/hr over 240 Minutes Intravenous Every 8 hours 09/07/21 1356     09/05/21 1551  ceFAZolin (ANCEF) 2-4 GM/100ML-% IVPB       Note to Pharmacy: Tippett, Amy J: cabinet override      09/05/21 1551 09/06/21 0359   09/05/21 1530  ceFAZolin (ANCEF) IVPB 2g/100 mL premix        2 g 200 mL/hr over 30 Minutes Intravenous Every 8 hours 09/05/21 1240 09/05/21 2304   09/05/21 0650  ceFAZolin (ANCEF) 2-4 GM/100ML-% IVPB       Note to Pharmacy: Jeanene Erb E: cabinet override      09/05/21 0650 09/05/21 0813   09/05/21 0600  ceFAZolin (ANCEF) IVPB 2g/100 mL premix        2 g 200 mL/hr over 30 Minutes Intravenous On call to O.R. 09/05/21 0114 09/05/21 0745       Assessment/Plan:  Slowly imrproviong Watch HIT as PL are dropping Continue daily cbc, ct, a/bs and NPO  Repeat swallow Monday  Caroleen Hamman, MD, [facility]  09/09/2021

## 2021-09-09 NOTE — Assessment & Plan Note (Addendum)
Platelets slowly dropping.  May need to consider alternative anticoagulation if this is due to HIT, checking HIT antibodies ,    Latest Ref Rng & Units 09/09/2021    4:10 AM 09/08/2021    5:50 AM 09/07/2021   11:46 AM  CBC  WBC 4.0 - 10.5 K/uL 4.5  9.4  8.4   Hemoglobin 12.0 - 15.0 g/dL 10.0  11.3  11.1   Hematocrit 36.0 - 46.0 % 30.6  34.7  34.6   Platelets 150 - 400 K/uL 110  118  130

## 2021-09-09 NOTE — Consult Note (Signed)
ANTICOAGULATION CONSULT NOTE   Pharmacy Consult for heparin drip Indication: atrial fibrillation  Allergies  Allergen Reactions   Baclofen Other (See Comments)   Codeine Nausea Only   Plavix [Clopidogrel]     brusing all over   Sulfa Antibiotics Nausea Only   Latex Rash    Patient Measurements: Height: '5\' 6"'$  (167.6 cm) Weight: 76.2 kg (168 lb) IBW/kg (Calculated) : 59.3 Heparin Dosing Weight: 74.7 kg  Vital Signs: Temp: 98 F (36.7 C) (08/04 1944) BP: 95/50 (08/05 0424) Pulse Rate: 97 (08/05 0424)  Labs: Recent Labs    09/07/21 1146 09/07/21 1444 09/07/21 1717 09/08/21 0550 09/08/21 1034 09/08/21 1908 09/09/21 0410  HGB 11.1*  --   --  11.3*  --   --  10.0*  HCT 34.6*  --   --  34.7*  --   --  30.6*  PLT 130*  --   --  118*  --   --  110*  APTT  --   --   --   --  39*  --   --   LABPROT  --   --   --   --  18.5*  --   --   INR  --   --   --   --  1.6*  --   --   HEPARINUNFRC  --   --   --   --   --  0.48 0.49  CREATININE 1.56*  --   --  1.95*  --   --  1.85*  TROPONINIHS  --  15 13  --   --   --   --      Estimated Creatinine Clearance: 24 mL/min (A) (by C-G formula based on SCr of 1.85 mg/dL (H)).   Medical History: Past Medical History:  Diagnosis Date   Adenomatous colon polyp    Anemia    Aortic atherosclerosis (HCC)    Arthritis    Barrett's esophagus    Bilateral carotid artery disease ([place])    a.) carotid doppler 07/18/2020: 8-41% RICA, 32-44% LICA; b.) carotid doppler 02/07/7251: 66-44% LICA, no sig RICA; c.) carotid doppler 01/23/2021: 0-34% RICA, 74-25% LICA; d.) carotid doppler 95/63/8756: 43-32% LICA, near norm RICA   CKD (chronic kidney disease), stage III (HCC)    COPD (chronic obstructive pulmonary disease) ([place])    Coronary artery disease 04/21/2004   a.) LHC 04/21/2004 [facility]): EF 66%, 50% dLCx, 75% pLCx, 75% oRCA, 50% pRCA --> transferred to [facility] for CVTS consult; b.) 3v CABG   Dyspnea on exertion    GERD (gastroesophageal  reflux disease)    Hiatal hernia    Hyperlipidemia    Hypertension    Long term current use of anticoagulant    a.) reduced dose apixaban   Macular degeneration    OSA on CPAP    PAD (peripheral artery disease) ([place]) 12/10/2016   a.) PTA of BILATERAL common iliac arteries --> 7 x 26 mm stents placed   PAF (paroxysmal atrial fibrillation) (HCC)    a.) CHA2DS2-VASc = 6 (age x 2, sex, HTN, vascular disease, T2DM);  b.) rate/rhythm maintained on oral metoprolol tartrate; chronically anticoagulated using dose reduced apixaban   Peripheral edema    PSVT (paroxysmal supraventricular tachycardia) (HCC)    S/P CABG x 3 04/2004   Schatzki's ring    Scoliosis of thoracolumbar spine    Valvular heart disease    a.) TTE 04/20/2004: EF >55%, mild MR; b.)  TTE 04/10/2021: EF 50-55%,  LVH, reduced RV SF, RVE, moderate BAE, mild-mod MR, severe TR.    Medications:  PTA apixaban 2.5 mg BID, last dose 09/01/2021 Heparin 5,000 units SQ TID, last dose 09/08/2021 @ 0530  Assessment: Patient is a 65 YOF with PMH significant for COPD, obstructive sleep apnea, peripheral arterial disease, coronary artery disease status post CABG, stage III chronic kidney disease, status post recent repair of paraesophageal hernia (09/05/21). Presented to [facility] for  robotic repair of paraesophageal hernia. During hospitalization she developed SOB and fever. Currently POD4. Patient was on apixaban 2.5 mg BID PTA an was receiving heparin 5000 units SQ for DVT ppx inpatient. Pharmacy has been consulted for heparin drip (Afib).  Baseline labs: Hgb 11.3, Hct 34.7, Plt 118, aPTT 39, PT 18.5, INR 1.6  8/4 19:08    HL 0.48, at 1100 units/hr therapeutic x 1  Goal of Therapy:  Heparin level 0.3-0.7 units/ml Monitor platelets by anticoagulation protocol: Yes   Plan:  8/5:  HL @ 0410 = 0.49, therapeutic X 2 Will continue pt on current rate and recheck HL on 8/6 with AM labs.   Zaylen Susman D 09/09/2021 4:48 AM

## 2021-09-09 NOTE — Assessment & Plan Note (Signed)
Further treatment plans per surgery.

## 2021-09-09 NOTE — Assessment & Plan Note (Signed)
Patient with a low-grade fever, Tmax of 100.5 F. Etiology of fever could be secondary to atelectasis versus pneumonia Chest x-ray shows diffuse bilateral interstitial pulmonary opacity concerning for edema in the setting of cardiomegaly. CT chest shows improvement on the right hemithorax after chest tube placement Negative blood cultures and respiratory viral panel as well

## 2021-09-09 NOTE — Assessment & Plan Note (Signed)
Renal function is improving, monitor closely.  Consider nephro consult if it continues to get worse.  Acute worsening likely due to hypotension leading to ATN.  Blood pressure improving as well  Lab Results  Component Value Date   CREATININE 1.85 (H) 09/09/2021   CREATININE 1.95 (H) 09/08/2021   CREATININE 1.56 (H) 09/07/2021

## 2021-09-09 NOTE — Progress Notes (Cosign Needed)
Patient is not able to walk the distance required to go the bathroom, or he/she is unable to safely negotiate stairs required to access the bathroom.  A 3in1 BSC will alleviate this problem  Valente David, RN, MSN, CCM Nurse Case Manager

## 2021-09-09 NOTE — TOC Initial Note (Signed)
Transition of Care [facility]) - Initial/Assessment Note    Patient Details  Name: Christina [name] MRN: [id number] Date of Birth: 06-01-1935  Transition of Care [facility]) CM/SW Contact:    Valente David, RN Phone Number: 09/09/2021, 2:46 PM  Clinical Narrative:                 Spoke with patient, admitted from home with husband.  PCP is Carmel [place], FNP.  [facility].  She has rollator at home, declines recommendation for rolling walker.  HHPT/OT recommended as well as 3in1.  Provided with choices of agencies, agrees to [employer].  Spoke with [name], agrees to service patient at discharge.  Jacquelyn with [employer] notified or order for 3in1, plan to have delivered at beside tomorrow.    Expected Discharge Plan: [place] Barriers to Discharge: Continued Medical Work up   Patient Goals and CMS Choice Patient states their goals for this hospitalization and ongoing recovery are:: Discharge home with HHPT/OT CMS Medicare.gov Compare Post Acute Care list provided to:: Patient Choice offered to / list presented to : Patient  Expected Discharge Plan and Services Expected Discharge Plan: [place]       Living arrangements for the past 2 months: Single Family Home Expected Discharge Date: 09/06/21               DME Arranged: 3-N-1 DME Agency: [employer] Date DME Agency Contacted: 09/09/21 Time DME Agency Contacted: [phone] Representative spoke with at DME Agency: [place]: PT, OT [place] Agency: [place] ([place]) Date [place]: 09/09/21 Time HH Agency Contacted: 9 Representative spoke with at [place]: [name]  Prior Living Arrangements/Services Living arrangements for the past 2 months: [place] Lives with:: Spouse Patient language and need for interpreter reviewed:: No Do you feel safe going back to the place where you live?: Yes      Need for Family Participation in Patient Care: Yes  (Comment) Care giver support system in place?: Yes (comment) Current home services: DME Criminal Activity/Legal Involvement Pertinent to Current Situation/Hospitalization: No - Comment as needed  Activities of Daily Living Home Assistive Devices/Equipment: Eyeglasses, Dentures (specify type) ADL Screening (condition at time of admission) Patient's cognitive ability adequate to safely complete daily activities?: Yes Is the patient deaf or have difficulty hearing?: No Does the patient have difficulty seeing, even when wearing glasses/contacts?: No Does the patient have difficulty concentrating, remembering, or making decisions?: No Patient able to express need for assistance with ADLs?: Yes Does the patient have difficulty dressing or bathing?: No Independently performs ADLs?: Yes (appropriate for developmental age) Does the patient have difficulty walking or climbing stairs?: No Weakness of Legs: None Weakness of Arms/Hands: None  Permission Sought/Granted Permission sought to share information with : Case Manager Permission granted to share information with : Yes, Verbal Permission Granted  Share Information with NAME: [name] and [place] granted to share info w AGENCY: [facility] [employer]        Emotional Assessment       Orientation: : Oriented to Situation, Oriented to  Time, Oriented to Place, Oriented to Self   Psych Involvement: No (comment)  Admission diagnosis:  S/P repair of paraesophageal hernia [N39.767, Z87.19] Patient Active Problem List   Diagnosis Date Noted   Thrombocytopenia ([place]) 09/09/2021   Fever 09/07/2021   Acute respiratory failure with hypoxia (HCC)    S/P repair of paraesophageal hernia 09/05/2021   Impaired fasting glucose  04/11/2021   Pneumonia 04/09/2021   PAF (paroxysmal atrial fibrillation) ([employer])    Primary hypertension 11/24/2020   Need for influenza vaccination 11/03/2020   Hiatal hernia with gastroesophageal reflux disease  without esophagitis 11/03/2020   Atherosclerosis of both carotid arteries 03/18/2020   Memory loss 03/18/2020   Hand arthritis 03/18/2020   Neck pain 03/18/2020   Aortic atherosclerosis ([place]) 10/26/2016   Claudication ([place]) 10/26/2016   COPD (chronic obstructive pulmonary disease) ([place]) 10/22/2016   OSA on CPAP 09/16/2015   Abnormal chest x-ray 01/25/2015   Atrophic vaginitis 01/25/2015   Coronary artery disease 01/25/2015   Stage 3a chronic kidney disease ([place]) 01/25/2015   Bloodgood disease 01/25/2015   Blood glucose elevated 01/25/2015   Hyperlipidemia 01/25/2015   Cannot sleep 01/25/2015   Hypertension 11/02/2014   Acid reflux 11/02/2014   Arthritis 10/19/2014   Adenomatous colon polyp 07/27/2013   Gastric catarrh 07/27/2013   Hiatal hernia 07/27/2013   Barrett esophagus 07/07/2013   Osteopenia 12/13/2009   PAD (peripheral artery disease) ([place]) 09/24/2007   DD (diverticular disease) 12/19/2005   PCP:  [name], FNP Pharmacy[facility]   [id number] [place] [id number]: [phone]: [phone]    Social Determinants of Health (SDOH) Interventions    Readmission Risk Interventions    04/10/2021    4:48 PM  Readmission Risk Prevention Plan  Transportation Screening Complete  PCP or Specialist Appt within 5-7 Days Complete  Home Care Screening Patient refused  Medication Review (RN CM) Complete

## 2021-09-09 NOTE — Assessment & Plan Note (Addendum)
Hold metoprolol due to hypotension resume statin at discharge

## 2021-09-09 NOTE — Plan of Care (Signed)

## 2021-09-09 NOTE — Assessment & Plan Note (Signed)
Blood pressure improved.  Hold metoprolol.

## 2021-09-09 NOTE — Progress Notes (Signed)
Occupational Therapy Treatment Patient Details Name: Christina [name] MRN: [id number] DOB: 11/18/1935 Today's Date: 09/09/2021   History of present illness Christina [name] is an 60yoF who comes [facility] on 8/[facility] for scheduled surgery with Dr. Dahlia Byes to address her paraesophogeal hernia. Pt moving well post surgery able to AMB the unit with moblity team twice, then postop day 2, difficulty AMB, breathing, ABD pain- CT revealing of pleural effusion and IR was called for chest tube placement PM 8/3. PMH: CAD s/p CABG x 3, severe tricuspid regurg on echo 2019, COPD, HTN CKD 3a, PAD, chronic dyspnea.   OT comments  Chart reviewed, pt greeted in room with husband present agreeable to OT tx session. Pt requests to transfer to bsc. Pt completes functional transfer with RW with supervision. Toileting completed with CGA for STS. Pt spo2 briefly down to 75% on 1L via [place] however quickly recovered up to >88% throughout mobility for the remainder of the session. Pt declined amb in hallway and requests to remain in room on this date. Tx session then targeted improving activity tolerance in preparation for standing ADL tasks. Pt stands at bed side performing static and dynamic tasks with supervision-CGA for approx 25 minutes with 2 seated rest breaks. Pt educated on importance of continuing to participate in mobility tasks to decrease generalized deconditioning.  Pt is left as received, NAD, all needs met. All lines/leads intact, chest tube to suction throughout. OT will continue to follow acutely.     Recommendations for follow up therapy are one component of a multi-disciplinary discharge planning process, led by the attending physician.  Recommendations may be updated based on patient status, additional functional criteria and insurance authorization.    Follow Up Recommendations  Home health OT    Assistance Recommended at Discharge Frequent or constant Supervision/Assistance  Patient can return home with the  following  A little help with walking and/or transfers;A little help with bathing/dressing/bathroom;Assistance with cooking/housework;Assist for transportation;Help with stairs or ramp for entrance;Direct supervision/assist for medications management   Equipment Recommendations  BSC/3in1    Recommendations for Other Services      Precautions / Restrictions Precautions Precautions: Fall Precaution Comments: R chest tube Restrictions Weight Bearing Restrictions: No       Mobility Bed Mobility Overal bed mobility: Needs Assistance Bed Mobility: Supine to Sit, Sit to Supine     Supine to sit: Min assist, HOB elevated Sit to supine: Min assist, HOB elevated   General bed mobility comments: eduated on body  mechanics for ease of bed mobility; pt albe to complete boost up the bed with MIN A +1 with use of boost feature    Transfers Overall transfer level: Needs assistance Equipment used: Rolling walker (2 wheels) Transfers: Sit to/from Stand Sit to Stand: Supervision, Min guard           General transfer comment: Pt stood at bed side for approx 25 minutes with 2 rest breaks for standing simulated ADL tasks     Balance Overall balance assessment: Needs assistance Sitting-balance support: Feet supported Sitting balance-Leahy Scale: Good     Standing balance support: Bilateral upper extremity supported, During functional activity Standing balance-Leahy Scale: Fair                             ADL either performed or assessed with clinical judgement   ADL Overall ADL's : Needs assistance/impaired Eating/Feeding: NPO   Grooming: Wash/dry hands;Sitting;Set up  Lower Body Dressing: Min guard;Sit to/from stand Lower Body Dressing Details (indicate cue type and reason): underwear Toilet Transfer: Supervision/safety;BSC/3in1;Stand-pivot;Rolling walker (2 wheels)   Toileting- Clothing Manipulation and Hygiene: Min guard;Sit to/from stand               Extremity/Trunk Assessment              Vision       Perception     Praxis      Cognition Arousal/Alertness: Awake/alert Behavior During Therapy: WFL for tasks assessed/performed Overall Cognitive Status: Within Functional Limits for tasks assessed                                          Exercises      Shoulder Instructions       General Comments      Pertinent Vitals/ Pain       Pain Assessment Pain Assessment: Faces Faces Pain Scale: Hurts little more Pain Location: chest tube site Pain Descriptors / Indicators: Grimacing, Guarding Pain Intervention(s): Monitored during session, Repositioned  Home Living                                          Prior Functioning/Environment              Frequency  Min 2X/week        Progress Toward Goals  OT Goals(current goals can now be found in the care plan section)  Progress towards OT goals: Progressing toward goals     Plan Discharge plan remains appropriate    Co-evaluation                 AM-PAC OT "6 Clicks" Daily Activity     Outcome Measure   Help from another person eating meals?: None Help from another person taking care of personal grooming?: A Little Help from another person toileting, which includes using toliet, bedpan, or urinal?: A Little Help from another person bathing (including washing, rinsing, drying)?: A Little Help from another person to put on and taking off regular upper body clothing?: None Help from another person to put on and taking off regular lower body clothing?: A Little 6 Click Score: 20    End of Session Equipment Utilized During Treatment: Oxygen  OT Visit Diagnosis: Other abnormalities of gait and mobility (R26.89);Muscle weakness (generalized) (M62.81)   Activity Tolerance Patient tolerated treatment well   Patient Left in bed;with call bell/phone within reach;with bed alarm set   Nurse  Communication Mobility status        Time: 4332-9518 OT Time Calculation (min): 44 min  Charges: OT General Charges $OT Visit: 1 Visit OT Treatments $Self Care/Home Management : 23-37 mins $Therapeutic Activity: 8-22 mins  Shanon Payor, OTD OTR/L  09/09/21, 3:04 PM

## 2021-09-09 NOTE — Progress Notes (Signed)
Progress Note   Patient: Christina [name] VZC:[id number] DOB: Jun 07, 1935 DOA: 09/05/2021     2 DOS: the patient was seen and examined on 09/09/2021   Brief hospital course:  86 y.o. female with past medical history significant for COPD, obstructive sleep apnea, peripheral arterial disease, coronary artery disease status post CABG, stage III chronic kidney disease, status post recent repair of paraesophageal hernia (09/05/21), right pleural effusion s/p chest tube placement on 8/3 with some concern for possible esophageal injury/leak. Medical consult requested for evaluation of worsening shortness of breath from her baseline associated with a low-grade fever (Tmax of 100.5)  8/4: Stopped metoprolol as patient is hypotensive.  Chest tube to suction 8/5: Chest x-ray unchanged.  Echo pending.  Kidney function and blood pressure improving   Assessment and Plan: * Fever Patient with a low-grade fever, Tmax of 100.5 F. Etiology of fever could be secondary to atelectasis versus pneumonia Chest x-ray shows diffuse bilateral interstitial pulmonary opacity concerning for edema in the setting of cardiomegaly. CT chest shows improvement on the right hemithorax after chest tube placement Negative blood cultures and respiratory viral panel as well  COPD (chronic obstructive pulmonary disease) (HCC) Stable and not acutely exacerbated Continue as needed bronchodilator therapy as well as inhaled steroids  Acute respiratory failure with hypoxia ([place]) Patient endorsing shortness of breath when compared to her baseline on room air pulse oximetry of 86% at rest currently on 2 L with improvement in her pulse oximetry to 94%. Acute respiratory failure likely secondary to atelectasis versus pneumonia versus pulmonary edema Continue oxygen supplementation to maintain pulse oximetry greater than 92.  Wean oxygen as able. Chest x-ray is unchanged.  Getting echo  PAF (paroxysmal atrial fibrillation) (HCC) Holding  metoprolol due to hypotension Apixaban was placed on hold for planned surgery.  On heparin drip for now.  Thrombocytopenia (HCC) Platelets slowly dropping.  May need to consider alternative anticoagulation if this is due to HIT, checking HIT antibodies ,    Latest Ref Rng & Units 09/09/2021    4:10 AM 09/08/2021    5:50 AM 09/07/2021   11:46 AM  CBC  WBC 4.0 - 10.5 K/uL 4.5  9.4  8.4   Hemoglobin 12.0 - 15.0 g/dL 10.0  11.3  11.1   Hematocrit 36.0 - 46.0 % 30.6  34.7  34.6   Platelets 150 - 400 K/uL 110  118  130     S/P repair of paraesophageal hernia Further treatment plans per surgery.  Primary hypertension Blood pressure improved.  Hold metoprolol.  Stage 3a chronic kidney disease ([place]) Renal function is improving, monitor closely.  Consider nephro consult if it continues to get worse.  Acute worsening likely due to hypotension leading to ATN.  Blood pressure improving as well  Lab Results  Component Value Date   CREATININE 1.85 (H) 09/09/2021   CREATININE 1.95 (H) 09/08/2021   CREATININE 1.56 (H) 09/07/2021    Coronary artery disease Hold metoprolol due to hypotension resume statin at discharge        Subjective: Blood pressure and kidney function improving.  Patient wants to eat stating she is never stayed hungry all her life.  Husband at bedside  Physical Exam: Vitals:   09/08/21 1944 09/09/21 0424 09/09/21 0728 09/09/21 0751  BP: 114/65 (!) 95/50  110/60  Pulse: 98 97  (!) 55  Resp: '18 18  18  '$ Temp: 98 F (36.7 C)   97.8 F (36.6 C)  TempSrc:      SpO2:  96% 97% 95% 98%  Weight:      Height:       86 year old female lying in the bed comfortably without any acute distress Lungs decreased breath sounds at the right base.  Chest tube to the right lateral chest wall drainage is serosanguineous.  No air leak Cardiovascular regular rate and rhythm Abdomen soft, incisional tenderness.  No rebound or guarding Neuro alert and oriented, nonfocal Psych normal  mood and affect Data Reviewed:  Creatinine 1.85, platelets 110 and trending down  Family Communication: Husband updated at bedside  DVT prophylaxis- heparin  Time spent: 15 minutes  Author: Max Sane, MD 09/09/2021 12:22 PM  For on call review [email or web address].

## 2021-09-09 NOTE — Assessment & Plan Note (Signed)
Holding metoprolol due to hypotension Apixaban was placed on hold for planned surgery.  On heparin drip for now.

## 2021-09-09 NOTE — Plan of Care (Signed)
  Problem: Education: Goal: Knowledge of General Education information will improve Description: Including pain rating scale, medication(s)/side effects and non-pharmacologic comfort measures Outcome: Progressing   Problem: Health Behavior/Discharge Planning: Goal: Ability to manage health-related needs will improve Outcome: Progressing   Problem: Clinical Measurements: Goal: Ability to maintain clinical measurements within normal limits will improve Outcome: Progressing Goal: Will remain free from infection Outcome: Progressing Goal: Diagnostic test results will improve Outcome: Progressing Goal: Respiratory complications will improve Outcome: Progressing Goal: Cardiovascular complication will be avoided Outcome: Progressing   Problem: Activity: Goal: Risk for activity intolerance will decrease Outcome: Progressing   Problem: Nutrition: Goal: Adequate nutrition will be maintained Outcome: Not Met (add Reason) Note: Patient NPO pending swallow eval on Monday 09/11/21   Problem: Coping: Goal: Level of anxiety will decrease Outcome: Progressing   Problem: Elimination: Goal: Will not experience complications related to bowel motility Outcome: Progressing Goal: Will not experience complications related to urinary retention Outcome: Progressing   Problem: Pain Managment: Goal: General experience of comfort will improve Outcome: Progressing   Problem: Safety: Goal: Ability to remain free from injury will improve Outcome: Progressing   Problem: Skin Integrity: Goal: Risk for impaired skin integrity will decrease Outcome: Progressing   Problem: Education: Goal: Required Educational Video(s) Outcome: Progressing   Problem: Clinical Measurements: Goal: Postoperative complications will be avoided or minimized Outcome: Progressing   Problem: Skin Integrity: Goal: Demonstration of wound healing without infection will improve Outcome: Progressing

## 2021-09-09 NOTE — Assessment & Plan Note (Signed)
Stable and not acutely exacerbated Continue as needed bronchodilator therapy as well as inhaled steroids 

## 2021-09-10 ENCOUNTER — Inpatient Hospital Stay
Admission: RE | Admit: 2021-09-10 | Discharge: 2021-09-10 | Disposition: A | Discharge status: Home / Self Care | Payer: Medicare Other | Source: Home / Self Care | Attending: Surgery | Admitting: Surgery

## 2021-09-10 DIAGNOSIS — Z9889 Other specified postprocedural states: Secondary | ICD-10-CM

## 2021-09-10 DIAGNOSIS — R509 Fever, unspecified: Secondary | ICD-10-CM | POA: Diagnosis not present

## 2021-09-10 DIAGNOSIS — Z8719 Personal history of other diseases of the digestive system: Secondary | ICD-10-CM

## 2021-09-10 DIAGNOSIS — J9601 Acute respiratory failure with hypoxia: Secondary | ICD-10-CM | POA: Diagnosis not present

## 2021-09-10 DIAGNOSIS — J449 Chronic obstructive pulmonary disease, unspecified: Secondary | ICD-10-CM | POA: Diagnosis not present

## 2021-09-10 DIAGNOSIS — I10 Essential (primary) hypertension: Secondary | ICD-10-CM | POA: Diagnosis not present

## 2021-09-10 LAB — ECHOCARDIOGRAM COMPLETE
AR max vel: 1.76 cm2
AV Area VTI: 1.59 cm2
AV Area mean vel: 1.55 cm2
AV Mean grad: 3 mmHg
AV Peak grad: 4.2 mmHg
Ao pk vel: 1.03 m/s
Area-P 1/2: 5.94 cm2
Height: 66 in
S' Lateral: 3.29 cm
Weight: 2688 oz

## 2021-09-10 LAB — CBC
HCT: 30.5 % — ABNORMAL LOW (ref 36.0–46.0)
Hemoglobin: 9.6 g/dL — ABNORMAL LOW (ref 12.0–15.0)
MCH: 32.5 pg (ref 26.0–34.0)
MCHC: 31.5 g/dL (ref 30.0–36.0)
MCV: 103.4 fL — ABNORMAL HIGH (ref 80.0–100.0)
Platelets: 126 10*3/uL — ABNORMAL LOW (ref 150–400)
RBC: 2.95 MIL/uL — ABNORMAL LOW (ref 3.87–5.11)
RDW: 14.4 % (ref 11.5–15.5)
WBC: 3.7 10*3/uL — ABNORMAL LOW (ref 4.0–10.5)
nRBC: 0 % (ref 0.0–0.2)

## 2021-09-10 LAB — BASIC METABOLIC PANEL
Anion gap: 4 — ABNORMAL LOW (ref 5–15)
BUN: 32 mg/dL — ABNORMAL HIGH (ref 8–23)
CO2: 20 mmol/L — ABNORMAL LOW (ref 22–32)
Calcium: 7.5 mg/dL — ABNORMAL LOW (ref 8.9–10.3)
Chloride: 116 mmol/L — ABNORMAL HIGH (ref 98–111)
Creatinine, Ser: 1.35 mg/dL — ABNORMAL HIGH (ref 0.44–1.00)
GFR, Estimated: 39 mL/min — ABNORMAL LOW (ref >60)
Glucose, Bld: 111 mg/dL — ABNORMAL HIGH (ref 70–99)
Potassium: 3.7 mmol/L (ref 3.5–5.1)
Sodium: 140 mmol/L (ref 135–145)

## 2021-09-10 LAB — HEPARIN INDUCED PLATELET AB (HIT ANTIBODY): Heparin Induced Plt Ab: 0.069 OD (ref 0.000–0.400)

## 2021-09-10 LAB — HEPARIN LEVEL (UNFRACTIONATED): Heparin Unfractionated: 0.43 IU/mL (ref 0.30–0.70)

## 2021-09-10 MED ORDER — METOPROLOL TARTRATE 25 MG PO TABS
12.5000 mg | ORAL_TABLET | Freq: Two times a day (BID) | ORAL | Status: DC
  Administered 2021-09-10 – 2021-09-15 (×11): 12.5 mg via ORAL
  Filled 2021-09-10 (×11): qty 1

## 2021-09-10 MED ORDER — SODIUM CHLORIDE 0.9 % IV SOLN: INTRAVENOUS | Status: DC | PRN

## 2021-09-10 NOTE — Progress Notes (Signed)
*  PRELIMINARY RESULTS* Echocardiogram 2D Echocardiogram has been performed.  Christina [name] Sircharles Holzheimer 09/10/2021, 11:38 AM

## 2021-09-10 NOTE — Assessment & Plan Note (Signed)
Resume metoprolol resume statin at discharge

## 2021-09-10 NOTE — Progress Notes (Signed)
End of shift note:    Pt remained on a heparin drip and continues IVF/IV antibiotics. Pt's chest tube output during this shift was 450 ml. VSS. Pt continues to be NPO. We were able to weaned off pt from O2. Pt's SpO2 on RA remained in the upper 90s

## 2021-09-10 NOTE — TOC Progression Note (Addendum)
Transition of Care [facility]) - Progression Note    Patient Details  Name: DELIGHT BICKLE MRN: [id number] Date of Birth: [birth year]  Transition of Care [facility]) CM/SW [place], LCSW Phone Number: 09/10/2021, 11:15 AM  Clinical Narrative:    Per MD, the earliest patient will DC will be Tues or Weds. Called [employer] and notified Jasmine, she stated DME was already delivered this morning. She will check with their Manager to see if this will be an issue with insurance.    Expected Discharge Plan: [place] Barriers to Discharge: Continued Medical Work up  Expected Discharge Plan and Services Expected Discharge Plan: [place] arrangements for the past 2 months: Single Family Home Expected Discharge Date: 09/06/21               DME Arranged: 3-N-1 DME Agency: [employer] Date DME Agency Contacted: 09/09/21 Time DME Agency Contacted: [phone] Representative spoke with at DME Agency: [place]: PT, OT [place] Agency: [place] ([place]) Date [place]: 09/09/21 Time [place]: 57 Representative spoke with at [place]: [place] ([place]) Interventions    Readmission Risk Interventions    04/10/2021    4:48 PM  Readmission Risk Prevention Plan  Transportation Screening Complete  PCP or Specialist Appt within 5-7 Days Complete  Home Care Screening Patient refused  Medication Review (RN CM) Complete

## 2021-09-10 NOTE — Assessment & Plan Note (Addendum)
Metoprolol for rate control Apixaban on hold.  On heparin drip for now

## 2021-09-10 NOTE — Consult Note (Signed)
ANTICOAGULATION CONSULT NOTE   Pharmacy Consult for heparin drip Indication: atrial fibrillation  Allergies  Allergen Reactions   Baclofen Other (See Comments)   Codeine Nausea Only   Plavix [Clopidogrel]     brusing all over   Sulfa Antibiotics Nausea Only   Latex Rash    Patient Measurements: Height: '5\' 6"'$  (167.6 cm) Weight: 76.2 kg (168 lb) IBW/kg (Calculated) : 59.3 Heparin Dosing Weight: 74.7 kg  Vital Signs: Temp: 98 F (36.7 C) (08/06 0743) Temp Source: Oral (08/06 0743) BP: 155/66 (08/06 0743) Pulse Rate: 80 (08/06 0502)  Labs: Recent Labs    09/07/21 1146 09/07/21 1444 09/07/21 1717 09/08/21 0550 09/08/21 1034 09/08/21 1908 09/09/21 0410 09/10/21 0629  HGB 11.1*  --   --  11.3*  --   --  10.0* 9.6*  HCT 34.6*  --   --  34.7*  --   --  30.6* 30.5*  PLT 130*  --   --  118*  --   --  110* 126*  APTT  --   --   --   --  39*  --   --   --   LABPROT  --   --   --   --  18.5*  --   --   --   INR  --   --   --   --  1.6*  --   --   --   HEPARINUNFRC  --   --   --   --   --  0.48 0.49 0.43  CREATININE 1.56*  --   --  1.95*  --   --  1.85*  --   TROPONINIHS  --  15 13  --   --   --   --   --      Estimated Creatinine Clearance: 24 mL/min (A) (by C-G formula based on SCr of 1.85 mg/dL (H)).   Medical History: Past Medical History:  Diagnosis Date   Adenomatous colon polyp    Anemia    Aortic atherosclerosis (HCC)    Arthritis    Barrett's esophagus    Bilateral carotid artery disease ([place])    a.) carotid doppler 07/18/2020: 2-37% RICA, 62-83% LICA; b.) carotid doppler 15/17/6160: 73-71% LICA, no sig RICA; c.) carotid doppler 01/23/2021: 0-62% RICA, 69-48% LICA; d.) carotid doppler 54/62/7035: 00-93% LICA, near norm RICA   CKD (chronic kidney disease), stage III (HCC)    COPD (chronic obstructive pulmonary disease) ([place])    Coronary artery disease 04/21/2004   a.) LHC 04/21/2004 [facility]): EF 66%, 50% dLCx, 75% pLCx, 75% oRCA, 50% pRCA --> transferred to  [facility] for CVTS consult; b.) 3v CABG   Dyspnea on exertion    GERD (gastroesophageal reflux disease)    Hiatal hernia    Hyperlipidemia    Hypertension    Long term current use of anticoagulant    a.) reduced dose apixaban   Macular degeneration    OSA on CPAP    PAD (peripheral artery disease) ([place]) 12/10/2016   a.) PTA of BILATERAL common iliac arteries --> 7 x 26 mm stents placed   PAF (paroxysmal atrial fibrillation) (HCC)    a.) CHA2DS2-VASc = 6 (age x 2, sex, HTN, vascular disease, T2DM);  b.) rate/rhythm maintained on oral metoprolol tartrate; chronically anticoagulated using dose reduced apixaban   Peripheral edema    PSVT (paroxysmal supraventricular tachycardia) ([place])    S/P CABG x 3 04/2004   Schatzki's ring  Scoliosis of thoracolumbar spine    Valvular heart disease    a.) TTE 04/20/2004: EF >55%, mild MR; b.)  TTE 04/10/2021: EF 50-55%, LVH, reduced RV SF, RVE, moderate BAE, mild-mod MR, severe TR.    Medications:  PTA apixaban 2.5 mg BID, last dose 09/01/2021 Heparin 5,000 units SQ TID, last dose 09/08/2021 @ 0530  Assessment: Patient is a 71 YOF with PMH significant for COPD, obstructive sleep apnea, peripheral arterial disease, coronary artery disease status post CABG, stage III chronic kidney disease, status post recent repair of paraesophageal hernia (09/05/21). Presented to [facility] for  robotic repair of paraesophageal hernia. During hospitalization she developed SOB and fever. Currently POD4. Patient was on apixaban 2.5 mg BID PTA an was receiving heparin 5000 units SQ for DVT ppx inpatient. Pharmacy has been consulted for heparin drip (Afib).  8/5 0410 HL 0.49  8/6 0629 HL 0.43   Goal of Therapy:  Heparin level 0.3-0.7 units/ml Monitor platelets by anticoagulation protocol: Yes   Plan:  Heparin level is therapeutic. Will continue heparin infusion at 1100 units/hr. Recheck heparin level with AM labs. CBC daily while on heparin.    Oswald Hillock, PharmD,  BCPS 09/10/2021 8:09 AM

## 2021-09-10 NOTE — Assessment & Plan Note (Addendum)
Blood pressure and heart rate trending up.  Metoprolol resumed

## 2021-09-10 NOTE — Assessment & Plan Note (Signed)
Transient and now resolved Etiology of fever could be secondary to atelectasis versus pneumonia Chest x-ray shows diffuse bilateral interstitial pulmonary opacity concerning for edema in the setting of cardiomegaly. CT chest shows improvement on the right hemithorax after chest tube placement Negative blood cultures and respiratory viral panel as well.

## 2021-09-10 NOTE — Progress Notes (Signed)
Progress Note   Patient: Christina [name] LKG:[id number] DOB: January 02, 1936 DOA: 09/05/2021     3 DOS: the patient was seen and examined on 09/10/2021   Brief hospital course:  86 y.o. female with past medical history significant for COPD, obstructive sleep apnea, peripheral arterial disease, coronary artery disease status post CABG, stage III chronic kidney disease, status post recent repair of paraesophageal hernia (09/05/21), right pleural effusion s/p chest tube placement on 8/3 with some concern for possible esophageal injury/leak. Medical consult requested for evaluation of worsening shortness of breath from her baseline associated with a low-grade fever (Tmax of 100.5)  8/4: Stopped metoprolol as patient is hypotensive.  Chest tube to suction 8/5: Chest x-ray unchanged. 8/6: Echo shows normal LV systolic function and grade 1 diastolic dysfunction.  Blood pressure and heart rate trending up so metoprolol restarted.  Kidney function and platelets improving   Assessment and Plan: * Fever Transient and now resolved Etiology of fever could be secondary to atelectasis versus pneumonia Chest x-ray shows diffuse bilateral interstitial pulmonary opacity concerning for edema in the setting of cardiomegaly. CT chest shows improvement on the right hemithorax after chest tube placement Negative blood cultures and respiratory viral panel as well.  COPD (chronic obstructive pulmonary disease) (HCC) Stable and not acutely exacerbated Continue as needed bronchodilator therapy as well as inhaled steroids.  Acute respiratory failure with hypoxia (HCC) Now resolved Acute respiratory failure likely secondary to atelectasis versus pneumonia versus pulmonary edema On room air now Chest x-ray is unchanged.  Echo shows normal LV systolic function and grade 1 diastolic dysfunction  PAF (paroxysmal atrial fibrillation) (HCC) Metoprolol for rate control Apixaban on hold.  On heparin drip for  now  Thrombocytopenia ([place]) Platelets improved today.  Monitor ,    Latest Ref Rng & Units 09/10/2021    6:29 AM 09/09/2021    4:10 AM 09/08/2021    5:50 AM  CBC  WBC 4.0 - 10.5 K/uL 3.7  4.5  9.4   Hemoglobin 12.0 - 15.0 g/dL 9.6  10.0  11.3   Hematocrit 36.0 - 46.0 % 30.5  30.6  34.7   Platelets 150 - 400 K/uL 126  110  118     S/P repair of paraesophageal hernia Further treatment plans per surgery  Primary hypertension Blood pressure and heart rate trending up.  Metoprolol resumed  Stage 3a chronic kidney disease ([place]) Renal function is improving, monitor closely.  Acute worsening likely due to hypotension leading to ATN.  Blood pressure improving as well  Lab Results  Component Value Date   CREATININE 1.35 (H) 09/10/2021   CREATININE 1.85 (H) 09/09/2021   CREATININE 1.95 (H) 09/08/2021    Coronary artery disease Resume metoprolol resume statin at discharge        Subjective: Hoping that she could eat.  Blood pressure and heart rate trending up.  Husband at bedside  Physical Exam: Vitals:   09/10/21 0502 09/10/21 0709 09/10/21 0743 09/10/21 0811  BP: (!) 147/65  (!) 155/66 (!) 141/56  Pulse: 80   (!) 103  Resp: '18  20 18  '$ Temp: 98.2 F (36.8 C)  98 F (36.7 C) 97.8 F (36.6 C)  TempSrc: Oral  Oral Oral  SpO2: 98% 97% 95% 97%  Weight:      Height:       86 year old female lying in the bed comfortably without any acute distress Lungs decreased breath sounds at the right base. Chest tube to the right lateral chest wall draining Cardiovascular  regular rate and rhythm Abdomen soft, incisional tenderness. No rebound or guarding Neuro alert and oriented, nonfocal Psych normal mood and affect Data Reviewed:  Creatinine 1.35, hemoglobin 9.6  Family Communication: Husband updated at bedside  DVT prophylaxis-SCDs Time spent: 15 minutes  Author: Max Sane, MD 09/10/2021 12:50 PM  For on call review [email or web address].

## 2021-09-10 NOTE — Assessment & Plan Note (Signed)
Platelets improved today.  Monitor ,    Latest Ref Rng & Units 09/10/2021    6:29 AM 09/09/2021    4:10 AM 09/08/2021    5:50 AM  CBC  WBC 4.0 - 10.5 K/uL 3.7  4.5  9.4   Hemoglobin 12.0 - 15.0 g/dL 9.6  10.0  11.3   Hematocrit 36.0 - 46.0 % 30.5  30.6  34.7   Platelets 150 - 400 K/uL 126  110  118

## 2021-09-10 NOTE — Assessment & Plan Note (Signed)
Further treatment plans per surgery

## 2021-09-10 NOTE — Progress Notes (Signed)
CC: s/p paraesophageal H Subjective: Feeling better, BP better, creat improving, good UO CT 600cc Pl better On heparin drip Echo preserved EF, normal Right atrial pressures reflecting Euvolemia  Objective: Vital signs in last 24 hours: Temp:  [97.8 F (36.6 C)-98.4 F (36.9 C)] 97.8 F (36.6 C) (08/06 0811) Pulse Rate:  [80-104] 92 (08/06 1313) Resp:  [18-20] 18 (08/06 0811) BP: (129-155)/(56-75) 152/71 (08/06 1313) SpO2:  [95 %-98 %] 97 % (08/06 0811) Last BM Date : 09/09/21  Intake/Output from previous day: 08/05 0701 - 08/06 0700 In: 2893.3 [I.V.:2643.3; IV Piggyback:250] Out: 598 [Chest Tube:598] Intake/Output this shift: No intake/output data recorded.  Physical exam: NAD Chest: decrease bases, CT no airleak, serous fluid ABd: soft, mild incisional tenderness  Lab Results: CBC  Recent Labs    09/09/21 0410 09/10/21 0629  WBC 4.5 3.7*  HGB 10.0* 9.6*  HCT 30.6* 30.5*  PLT 110* 126*   BMET Recent Labs    09/09/21 0410 09/10/21 0629  NA 136 140  K 4.0 3.7  CL 109 116*  CO2 20* 20*  GLUCOSE 104* 111*  BUN 48* 32*  CREATININE 1.85* 1.35*  CALCIUM 7.2* 7.5*   PT/INR Recent Labs    09/08/21 1034  LABPROT 18.5*  INR 1.6*   ABG No results for input(s): "PHART", "HCO3" in the last 72 hours.  Invalid input(s): "PCO2", "PO2"  Studies/Results: ECHOCARDIOGRAM COMPLETE  Result Date: 09/10/2021    ECHOCARDIOGRAM REPORT   Patient Name:   Christina [name] Date of Exam: 09/10/2021 Medical Rec #:  [id number]           Height:       66.0 in Accession #:    [id number]          Weight:       168.0 lb Date of Birth:  10-26-1935           BSA:          1.857 m Patient Age:    86 years            BP:           147/65 mmHg Patient Gender: F                   HR:           92 bpm. Exam Location:  [facility] Procedure: 2D Echo, Color Doppler and Cardiac Doppler Indications:     R06.03 Acute respiratory distress  History:         Patient has prior history of Echocardiogram  examinations, most                  recent 04/10/2021. CAD, Prior CABG, CKD, COPD and PAD; Risk                  Factors:Hypertension, Dyslipidemia and Sleep Apnea.  Sonographer:     Charmayne Sheer Referring Phys:  [id number] Delante Karapetyan F Lennyn Bellanca Diagnosing Phys: Neoma Laming  Sonographer Comments: Suboptimal apical window. Image acquisition challenging due to COPD. IMPRESSIONS  1. Left ventricular ejection fraction, by estimation, is 55 to 60%. The left ventricle has normal function. The left ventricle has no regional wall motion abnormalities. There is mild concentric left ventricular hypertrophy. Left ventricular diastolic parameters are consistent with Grade I diastolic dysfunction (impaired relaxation).  2. Right ventricular systolic function is mildly reduced. The right ventricular size is normal.  3. Left atrial size was moderately dilated.  4. Right atrial size was moderately  dilated.  5. The mitral valve is normal in structure. Trivial mitral valve regurgitation. No evidence of mitral stenosis.  6. Tricuspid valve regurgitation is moderate.  7. The aortic valve is grossly normal. Aortic valve regurgitation is not visualized. No aortic stenosis is present.  8. The inferior vena cava is normal in size with greater than 50% respiratory variability, suggesting right atrial pressure of 3 mmHg. FINDINGS  Left Ventricle: Left ventricular ejection fraction, by estimation, is 55 to 60%. The left ventricle has normal function. The left ventricle has no regional wall motion abnormalities. The left ventricular internal cavity size was normal in size. There is  mild concentric left ventricular hypertrophy. Left ventricular diastolic parameters are consistent with Grade I diastolic dysfunction (impaired relaxation). Right Ventricle: The right ventricular size is normal. No increase in right ventricular wall thickness. Right ventricular systolic function is mildly reduced. Left Atrium: Left atrial size was moderately dilated. Right  Atrium: Right atrial size was moderately dilated. Pericardium: There is no evidence of pericardial effusion. Mitral Valve: The mitral valve is normal in structure. Trivial mitral valve regurgitation. No evidence of mitral valve stenosis. Tricuspid Valve: The tricuspid valve is normal in structure. Tricuspid valve regurgitation is moderate . No evidence of tricuspid stenosis. Aortic Valve: The aortic valve is grossly normal. Aortic valve regurgitation is not visualized. No aortic stenosis is present. Aortic valve mean gradient measures 3.0 mmHg. Aortic valve peak gradient measures 4.2 mmHg. Aortic valve area, by VTI measures 1.59 cm. Pulmonic Valve: The pulmonic valve was normal in structure. Pulmonic valve regurgitation is trivial. No evidence of pulmonic stenosis. Aorta: The aortic root is normal in size and structure. Venous: The inferior vena cava is normal in size with greater than 50% respiratory variability, suggesting right atrial pressure of 3 mmHg. IAS/Shunts: No atrial level shunt detected by color flow Doppler.  LEFT VENTRICLE PLAX 2D LVIDd:         4.71 cm   Diastology LVIDs:         3.29 cm   LV e' medial:    11.60 cm/s LV PW:         0.94 cm   LV E/e' medial:  7.8 LV IVS:        0.87 cm   LV e' lateral:   15.20 cm/s LVOT diam:     1.80 cm   LV E/e' lateral: 5.9 LV SV:         34 LV SV Index:   18 LVOT Area:     2.54 cm  RIGHT VENTRICLE RV Basal diam:  3.72 cm TAPSE (M-mode): 2.0 cm LEFT ATRIUM             Index        RIGHT ATRIUM           Index LA diam:        4.20 cm 2.26 cm/m   RA Area:     21.30 cm LA Vol (A2C):   50.8 ml 27.36 ml/m  RA Volume:   64.20 ml  34.57 ml/m LA Vol (A4C):   64.2 ml 34.57 ml/m LA Biplane Vol: 62.4 ml 33.60 ml/m  AORTIC VALVE                    PULMONIC VALVE AV Area (Vmax):    1.76 cm     PV Vmax:       1.17 m/s AV Area (Vmean):   1.55 cm     PV Peak grad:  5.5 mmHg AV Area (VTI):     1.59 cm AV Vmax:           103.00 cm/s AV Vmean:          78.400 cm/s AV VTI:             0.213 m AV Peak Grad:      4.2 mmHg AV Mean Grad:      3.0 mmHg LVOT Vmax:         71.10 cm/s LVOT Vmean:        47.900 cm/s LVOT VTI:          0.133 m LVOT/AV VTI ratio: 0.62  AORTA Ao Root diam: 3.20 cm MITRAL VALVE               TRICUSPID VALVE MV Area (PHT): 5.94 cm    TR Peak grad:   33.4 mmHg MV Decel Time: 128 msec    TR Vmax:        289.00 cm/s MV E velocity: 89.97 cm/s                            SHUNTS                            Systemic VTI:  0.13 m                            Systemic Diam: 1.80 cm Neoma Laming Electronically signed by Neoma Laming Signature Date/Time: 09/10/2021/12:10:16 PM    Final    DG CHEST PORT 1 VIEW  Result Date: 09/09/2021 CLINICAL DATA:  Recent hiatal hernia surgery. EXAM: PORTABLE CHEST 1 VIEW COMPARISON:  09/08/2021 FINDINGS: The cardio pericardial silhouette is enlarged. Stable fullness right hilar region. Interstitial markings are diffusely coarsened with chronic features. Right pleural drain again noted. IMPRESSION: No interval change in exam. No new or progressive findings. Electronically Signed   By: Misty Stanley M.D.   On: 09/09/2021 10:09    Anti-infectives: Anti-infectives (From admission, onward)    Start     Dose/Rate Route Frequency Ordered Stop   09/07/21 1445  piperacillin-tazobactam (ZOSYN) IVPB 3.375 g        3.375 g 12.5 mL/hr over 240 Minutes Intravenous Every 8 hours 09/07/21 1356     09/05/21 1551  ceFAZolin (ANCEF) 2-4 GM/100ML-% IVPB       Note to Pharmacy: Tippett, Amy J: cabinet override      09/05/21 1551 09/06/21 0359   09/05/21 1530  ceFAZolin (ANCEF) IVPB 2g/100 mL premix        2 g 200 mL/hr over 30 Minutes Intravenous Every 8 hours 09/05/21 1240 09/05/21 2304   09/05/21 0650  ceFAZolin (ANCEF) 2-4 GM/100ML-% IVPB       Note to Pharmacy: Jeanene Erb E: cabinet override      09/05/21 0650 09/05/21 0813   09/05/21 0600  ceFAZolin (ANCEF) IVPB 2g/100 mL premix        2 g 200 mL/hr over 30 Minutes Intravenous On call  to O.R. 09/05/21 0114 09/05/21 0745       Assessment/Plan: Continue to improve No evidence of mediastinitis or sepsis Watch HIT as PL are down Continue daily cbc, ct, a/bs and NPO  Repeat swallow tomorrow CT may need to be kept until Tuesday or wednesday depending on output and clinical picture  Caroleen Hamman, MD, [facility]  09/10/2021

## 2021-09-10 NOTE — Assessment & Plan Note (Signed)
Now resolved Acute respiratory failure likely secondary to atelectasis versus pneumonia versus pulmonary edema On room air now Chest x-ray is unchanged.  Echo shows normal LV systolic function and grade 1 diastolic dysfunction

## 2021-09-10 NOTE — Assessment & Plan Note (Signed)
Stable and not acutely exacerbated Continue as needed bronchodilator therapy as well as inhaled steroids 

## 2021-09-10 NOTE — Assessment & Plan Note (Signed)
Renal function is improving, monitor closely.  Acute worsening likely due to hypotension leading to ATN.  Blood pressure improving as well  Lab Results  Component Value Date   CREATININE 1.35 (H) 09/10/2021   CREATININE 1.85 (H) 09/09/2021   CREATININE 1.95 (H) 09/08/2021

## 2021-09-11 ENCOUNTER — Inpatient Hospital Stay: Payer: Medicare Other

## 2021-09-11 DIAGNOSIS — R509 Fever, unspecified: Secondary | ICD-10-CM | POA: Diagnosis not present

## 2021-09-11 DIAGNOSIS — J449 Chronic obstructive pulmonary disease, unspecified: Secondary | ICD-10-CM | POA: Diagnosis not present

## 2021-09-11 DIAGNOSIS — I480 Paroxysmal atrial fibrillation: Secondary | ICD-10-CM | POA: Diagnosis not present

## 2021-09-11 DIAGNOSIS — J9601 Acute respiratory failure with hypoxia: Secondary | ICD-10-CM | POA: Diagnosis not present

## 2021-09-11 LAB — BASIC METABOLIC PANEL
Anion gap: 7 (ref 5–15)
BUN: 21 mg/dL (ref 8–23)
CO2: 20 mmol/L — ABNORMAL LOW (ref 22–32)
Calcium: 8.1 mg/dL — ABNORMAL LOW (ref 8.9–10.3)
Chloride: 118 mmol/L — ABNORMAL HIGH (ref 98–111)
Creatinine, Ser: 1.05 mg/dL — ABNORMAL HIGH (ref 0.44–1.00)
GFR, Estimated: 53 mL/min — ABNORMAL LOW (ref >60)
Glucose, Bld: 99 mg/dL (ref 70–99)
Potassium: 3.9 mmol/L (ref 3.5–5.1)
Sodium: 145 mmol/L (ref 135–145)

## 2021-09-11 LAB — CBC
HCT: 33.5 % — ABNORMAL LOW (ref 36.0–46.0)
Hemoglobin: 10.6 g/dL — ABNORMAL LOW (ref 12.0–15.0)
MCH: 32.7 pg (ref 26.0–34.0)
MCHC: 31.6 g/dL (ref 30.0–36.0)
MCV: 103.4 fL — ABNORMAL HIGH (ref 80.0–100.0)
Platelets: 148 10*3/uL — ABNORMAL LOW (ref 150–400)
RBC: 3.24 MIL/uL — ABNORMAL LOW (ref 3.87–5.11)
RDW: 14.7 % (ref 11.5–15.5)
WBC: 3 10*3/uL — ABNORMAL LOW (ref 4.0–10.5)
nRBC: 0 % (ref 0.0–0.2)

## 2021-09-11 LAB — HEPARIN LEVEL (UNFRACTIONATED)
Heparin Unfractionated: 0.24 IU/mL — ABNORMAL LOW (ref 0.30–0.70)
Heparin Unfractionated: 0.43 IU/mL (ref 0.30–0.70)
Heparin Unfractionated: 0.48 IU/mL (ref 0.30–0.70)

## 2021-09-11 MED ORDER — FUROSEMIDE 10 MG/ML IJ SOLN
20.0000 mg | Freq: Once | INTRAMUSCULAR | Status: AC
Start: 2021-09-11 — End: 2021-09-11
  Administered 2021-09-11: 20 mg via INTRAVENOUS
  Filled 2021-09-11: qty 4

## 2021-09-11 MED ORDER — HEPARIN BOLUS VIA INFUSION
1100.0000 [IU] | Freq: Once | INTRAVENOUS | Status: AC
  Administered 2021-09-11: 1100 [IU] via INTRAVENOUS
  Filled 2021-09-11: qty 1100

## 2021-09-11 MED ORDER — IOHEXOL 300 MG/ML  SOLN
125.0000 mL | Freq: Once | INTRAMUSCULAR | Status: AC | PRN
  Administered 2021-09-11: 125 mL via ORAL

## 2021-09-11 NOTE — Care Management Important Message (Signed)
Important Message  Patient Details  Name: Christina [name] MRN: [id number] Date of Birth: 1935/07/21   Medicare Important Message Given:  Yes     Dannette Barbara 09/11/2021, 11:18 AM

## 2021-09-11 NOTE — Assessment & Plan Note (Signed)
Continue metoprolol. 

## 2021-09-11 NOTE — Assessment & Plan Note (Addendum)
Transient and now resolved Etiology of fever could be secondary to atelectasis versus intra-abdominal infection.  Currently on IV Zosyn.  This could be stopped at discharge Chest x-ray shows diffuse bilateral interstitial pulmonary opacity concerning for edema in the setting of cardiomegaly. CT chest shows improvement on the right hemithorax after chest tube placement Negative blood cultures and respiratory viral panel as well.

## 2021-09-11 NOTE — TOC CM/SW Note (Signed)
Patient is not able to walk the distance required to go the bathroom, or he/she is unable to safely negotiate stairs required to access the bathroom.  A 3in1 BSC will alleviate this problem  

## 2021-09-11 NOTE — Consult Note (Signed)
ANTICOAGULATION CONSULT NOTE   Pharmacy Consult for heparin drip Indication: atrial fibrillation  Allergies  Allergen Reactions   Baclofen Other (See Comments)   Codeine Nausea Only   Plavix [Clopidogrel]     brusing all over   Sulfa Antibiotics Nausea Only   Latex Rash    Patient Measurements: Height: '5\' 6"'$  (167.6 cm) Weight: 76.2 kg (168 lb) IBW/kg (Calculated) : 59.3 Heparin Dosing Weight: 74.7 kg  Vital Signs: Temp: 97.9 F (36.6 C) (08/07 1559) Temp Source: Oral (08/07 1559) BP: 131/51 (08/07 1559) Pulse Rate: 90 (08/07 1559)  Labs: Recent Labs    09/09/21 0410 09/10/21 0629 09/11/21 0444 09/11/21 1412 09/11/21 2149  HGB 10.0* 9.6* 10.6*  --   --   HCT 30.6* 30.5* 33.5*  --   --   PLT 110* 126* 148*  --   --   HEPARINUNFRC 0.49 0.43 0.24* 0.43 0.48  CREATININE 1.85* 1.35* 1.05*  --   --      Estimated Creatinine Clearance: 42.4 mL/min (A) (by C-G formula based on SCr of 1.05 mg/dL (H)).   Medical History: Past Medical History:  Diagnosis Date   Adenomatous colon polyp    Anemia    Aortic atherosclerosis (HCC)    Arthritis    Barrett's esophagus    Bilateral carotid artery disease ([place])    a.) carotid doppler 07/18/2020: 0-93% RICA, 81-82% LICA; b.) carotid doppler 99/37/1696: 78-93% LICA, no sig RICA; c.) carotid doppler 01/23/2021: 8-10% RICA, 17-51% LICA; d.) carotid doppler 02/58/5277: 82-42% LICA, near norm RICA   CKD (chronic kidney disease), stage III (HCC)    COPD (chronic obstructive pulmonary disease) ([place])    Coronary artery disease 04/21/2004   a.) LHC 04/21/2004 [facility]): EF 66%, 50% dLCx, 75% pLCx, 75% oRCA, 50% pRCA --> transferred to [facility] for CVTS consult; b.) 3v CABG   Dyspnea on exertion    GERD (gastroesophageal reflux disease)    Hiatal hernia    Hyperlipidemia    Hypertension    Long term current use of anticoagulant    a.) reduced dose apixaban   Macular degeneration    OSA on CPAP    PAD (peripheral artery disease)  ([place]) 12/10/2016   a.) PTA of BILATERAL common iliac arteries --> 7 x 26 mm stents placed   PAF (paroxysmal atrial fibrillation) (HCC)    a.) CHA2DS2-VASc = 6 (age x 2, sex, HTN, vascular disease, T2DM);  b.) rate/rhythm maintained on oral metoprolol tartrate; chronically anticoagulated using dose reduced apixaban   Peripheral edema    PSVT (paroxysmal supraventricular tachycardia) ([place])    S/P CABG x 3 04/2004   Schatzki's ring    Scoliosis of thoracolumbar spine    Valvular heart disease    a.) TTE 04/20/2004: EF >55%, mild MR; b.)  TTE 04/10/2021: EF 50-55%, LVH, reduced RV SF, RVE, moderate BAE, mild-mod MR, severe TR.    Medications:  PTA apixaban 2.5 mg BID, last dose 09/01/2021 Heparin 5,000 units SQ TID, last dose 09/08/2021 @ 0530  Assessment: Patient is a 17 YOF with PMH significant for COPD, obstructive sleep apnea, peripheral arterial disease, coronary artery disease status post CABG, stage III chronic kidney disease, status post recent repair of paraesophageal hernia (09/05/21). Presented to [facility] for  robotic repair of paraesophageal hernia. During hospitalization she developed SOB and fever. Currently POD4. Patient was on apixaban 2.5 mg BID PTA an was receiving heparin 5000 units SQ for DVT ppx inpatient. Pharmacy has been consulted for heparin drip (Afib).  8/5 0410 HL 0.49  8/6 0629 HL 0.43  8/7 0444 HL 0.24, SUBtherapeutic  8/7 1412 HL 0.43 8/7 2149 HL 0.48, therapeutic x 2  Goal of Therapy:  Heparin level 0.3-0.7 units/ml Monitor platelets by anticoagulation protocol: Yes   Plan:  Continue heparin infusion at 1250 units/hr Will recheck HL in daily w/ AM labs while therapeutic  CBC daily while on heparin  Renda Rolls, PharmD, MBA 09/11/2021 10:39 PM   Paulina Fusi, PharmD, BCPS 09/11/2021 10:38 PM

## 2021-09-11 NOTE — Assessment & Plan Note (Signed)
Renal function is improving, monitor closely.  Acute worsening likely due to hypotension from ATN.  Blood pressure improving as well  Lab Results  Component Value Date   CREATININE 1.05 (H) 09/11/2021   CREATININE 1.35 (H) 09/10/2021   CREATININE 1.85 (H) 09/09/2021

## 2021-09-11 NOTE — Assessment & Plan Note (Signed)
Platelets improving ,    Latest Ref Rng & Units 09/11/2021    4:44 AM 09/10/2021    6:29 AM 09/09/2021    4:10 AM  CBC  WBC 4.0 - 10.5 K/uL 3.0  3.7  4.5   Hemoglobin 12.0 - 15.0 g/dL 10.6  9.6  10.0   Hematocrit 36.0 - 46.0 % 33.5  30.5  30.6   Platelets 150 - 400 K/uL 148  126  110

## 2021-09-11 NOTE — Consult Note (Signed)
ANTICOAGULATION CONSULT NOTE   Pharmacy Consult for heparin drip Indication: atrial fibrillation  Allergies  Allergen Reactions   Baclofen Other (See Comments)   Codeine Nausea Only   Plavix [Clopidogrel]     brusing all over   Sulfa Antibiotics Nausea Only   Latex Rash    Patient Measurements: Height: '5\' 6"'$  (167.6 cm) Weight: 76.2 kg (168 lb) IBW/kg (Calculated) : 59.3 Heparin Dosing Weight: 74.7 kg  Vital Signs: Temp: 97.7 F (36.5 C) (08/07 0301) Temp Source: Oral (08/07 0301) BP: 145/74 (08/07 0301) Pulse Rate: 91 (08/07 0301)  Labs: Recent Labs    09/08/21 1034 09/08/21 1908 09/09/21 0410 09/10/21 0629 09/11/21 0444  HGB  --    < > 10.0* 9.6* 10.6*  HCT  --   --  30.6* 30.5* 33.5*  PLT  --   --  110* 126* 148*  APTT 39*  --   --   --   --   LABPROT 18.5*  --   --   --   --   INR 1.6*  --   --   --   --   HEPARINUNFRC  --    < > 0.49 0.43 0.24*  CREATININE  --   --  1.85* 1.35* 1.05*   < > = values in this interval not displayed.     Estimated Creatinine Clearance: 42.4 mL/min (A) (by C-G formula based on SCr of 1.05 mg/dL (H)).   Medical History: Past Medical History:  Diagnosis Date   Adenomatous colon polyp    Anemia    Aortic atherosclerosis (HCC)    Arthritis    Barrett's esophagus    Bilateral carotid artery disease ([place])    a.) carotid doppler 07/18/2020: 9-52% RICA, 84-13% LICA; b.) carotid doppler 24/40/1027: 25-36% LICA, no sig RICA; c.) carotid doppler 01/23/2021: 6-44% RICA, 03-47% LICA; d.) carotid doppler 42/59/5638: 75-64% LICA, near norm RICA   CKD (chronic kidney disease), stage III (HCC)    COPD (chronic obstructive pulmonary disease) ([place])    Coronary artery disease 04/21/2004   a.) LHC 04/21/2004 [facility]): EF 66%, 50% dLCx, 75% pLCx, 75% oRCA, 50% pRCA --> transferred to [facility] for CVTS consult; b.) 3v CABG   Dyspnea on exertion    GERD (gastroesophageal reflux disease)    Hiatal hernia    Hyperlipidemia    Hypertension     Long term current use of anticoagulant    a.) reduced dose apixaban   Macular degeneration    OSA on CPAP    PAD (peripheral artery disease) ([place]) 12/10/2016   a.) PTA of BILATERAL common iliac arteries --> 7 x 26 mm stents placed   PAF (paroxysmal atrial fibrillation) (HCC)    a.) CHA2DS2-VASc = 6 (age x 2, sex, HTN, vascular disease, T2DM);  b.) rate/rhythm maintained on oral metoprolol tartrate; chronically anticoagulated using dose reduced apixaban   Peripheral edema    PSVT (paroxysmal supraventricular tachycardia) ([place])    S/P CABG x 3 04/2004   Schatzki's ring    Scoliosis of thoracolumbar spine    Valvular heart disease    a.) TTE 04/20/2004: EF >55%, mild MR; b.)  TTE 04/10/2021: EF 50-55%, LVH, reduced RV SF, RVE, moderate BAE, mild-mod MR, severe TR.    Medications:  PTA apixaban 2.5 mg BID, last dose 09/01/2021 Heparin 5,000 units SQ TID, last dose 09/08/2021 @ 0530  Assessment: Patient is a 72 YOF with PMH significant for COPD, obstructive sleep apnea, peripheral arterial disease, coronary artery disease  status post CABG, stage III chronic kidney disease, status post recent repair of paraesophageal hernia (09/05/21). Presented to [facility] for  robotic repair of paraesophageal hernia. During hospitalization she developed SOB and fever. Currently POD4. Patient was on apixaban 2.5 mg BID PTA an was receiving heparin 5000 units SQ for DVT ppx inpatient. Pharmacy has been consulted for heparin drip (Afib).  8/5 0410 HL 0.49  8/6 0629 HL 0.43  8/7 0444 HL 0.24, SUBtherapeutic   Goal of Therapy:  Heparin level 0.3-0.7 units/ml Monitor platelets by anticoagulation protocol: Yes   Plan:  8/7:  HL @ 0444 = 0.24, SUBtherapeutic  Will order Heparin 1100 units IV X 1 bolus and increase drip rate to 1250 units/hr.  Will recheck HL 8 hrs after rate change.   Linzi Ohlinger D, PharmD 09/11/2021 6:12 AM

## 2021-09-11 NOTE — Assessment & Plan Note (Signed)
Metoprolol for rate control, can go up on the dose if heart rate trends up with ambulation Apixaban on hold.  On heparin drip for now.

## 2021-09-11 NOTE — Assessment & Plan Note (Addendum)
Stable and not acutely exacerbated Continue as needed bronchodilator therapy as well as inhaled steroids 

## 2021-09-11 NOTE — Progress Notes (Signed)
Mobility Specialist - Progress Note   09/11/21 1200  Mobility  Activity Ambulated with assistance in hallway;Transferred to/from [facility]  Level of Assistance Standby assist, set-up cues, supervision of patient - no hands on  Assistive Device Front wheel walker  Distance Ambulated (ft) 170 ft  Activity Response Tolerated well  $Mobility charge 1 Mobility     Pre-mobility: 108 HR, 94% SpO2 During mobility: 133 HR, 86-95% SpO2 Post-mobility: 100 HR, 93% SpO2   Pt lying in bed upon arrival, utilizing RA. Pt denied pain, nausea, and fatigue. Able to sit EOB with minA and extra time. Stood with light assist from spouse---may have been able to stand with supervision if possible. Ambulated in hallway with supervision---assist only for line management d/t IV/chest tube. SOB on exertion; O2 reading 86% via pulse ox but was mid 90s on dinamap after short standing break. PLB. Mild lightheadedness during ambulation. Upon return to room, pt sat on BSC for loose, dark BM. Does begin to voice pain in rectum d/t hemorrhoids. Assist for peri-care d/t need for BUE support. Pt returned supine with minA. Alarm set, needs in reach.    Kathee Delton Mobility Specialist 09/11/21, 12:56 PM

## 2021-09-11 NOTE — Progress Notes (Signed)
[place] Hospital Day(s): 4.   Post op day(s): 6 Days Post-Op.   Interval History:  Patient seen and examined No acute events or new complaints overnight.  Patient reports she is doing well; sore at chest tube site; abdominal pain improved Some distension  No fever, chills, nausea, emesis She is actually leukopenic again this morning; 3.0K Hgb stable to 10.6 Renal function nearly normalized; sCr - 1.05; UO - unmeasured.  Chest drain output: 740 ccs; serosanguinous; no air leak  She is NPO Continues on Zosyn  Having bowel function   Vital signs in last 24 hours: [min-max] current  Temp:  [97.6 F (36.4 C)-98.1 F (36.7 C)] 97.7 F (36.5 C) (08/07 0301) Pulse Rate:  [91-106] 91 (08/07 0301) Resp:  [17-20] 20 (08/07 0301) BP: (141-155)/(56-86) 145/74 (08/07 0301) SpO2:  [95 %-97 %] 96 % (08/07 0738)     Height: '5\' 6"'$  (167.6 cm) Weight: 76.2 kg BMI (Calculated): 27.13   Intake/Output last 2 shifts:  08/06 0701 - 08/07 0700 In: 2246.3 [I.V.:2073; IV Piggyback:173.3] Out: 740 [Chest Tube:740]   Physical Exam:  Constitutional: alert, cooperative and no distress  Respiratory: breathing non-labored at rest; on [place] Chest: Chest tube to right lateral chest wall; site is CDI; output serous now; no air leak Cardiovascular: regular rate and sinus rhythm  Gastrointestinal: Soft, incisional soreness, distension and tympany over the stomach, no rebound/guarding. Integumentary: Laparoscopic incisions are CDI with dermabond, no erythema or drainage, some ecchymosis   Labs:     Latest Ref Rng & Units 09/11/2021    4:44 AM 09/10/2021    6:29 AM 09/09/2021    4:10 AM  CBC  WBC 4.0 - 10.5 K/uL 3.0  3.7  4.5   Hemoglobin 12.0 - 15.0 g/dL 10.6  9.6  10.0   Hematocrit 36.0 - 46.0 % 33.5  30.5  30.6   Platelets 150 - 400 K/uL 148  126  110       Latest Ref Rng & Units 09/11/2021    4:44 AM 09/10/2021    6:29 AM 09/09/2021    4:10 AM  CMP  Glucose 70 -  99 mg/dL 99  111  104   BUN 8 - 23 mg/dL 21  32  48   Creatinine 0.44 - 1.00 mg/dL 1.05  1.35  1.85   Sodium 135 - 145 mmol/L 145  140  136   Potassium 3.5 - 5.1 mmol/L 3.9  3.7  4.0   Chloride 98 - 111 mmol/L 118  116  109   CO2 22 - 32 mmol/L '20  20  20   '$ Calcium 8.9 - 10.3 mg/dL 8.1  7.5  7.2      Imaging studies:   CXR (09/11/2021) personally reviewed improved right sided pleural effusion, no pneumothorax, drain in adequate position, and radiologist report reviewed below:  IMPRESSION: 1. Right pleural drain with tiny right pleural effusion. 2. Stable retrocardiac left base atelectasis or consolidation with small left pleural effusion.   UGI (09/11/2021) pending    Assessment/Plan:  86 y.o. female with right pleural effusion s/p chest tube placement (08/03) and concern for possible esophageal injury although work up without evidence of this 6 Days Post-Op s/p robotic assisted laparoscopic paraesophageal hernia repair with Nissen fundoplication.   - Plan for repeat UGI this morning to ensure no leak. If negative, will initiate CLD  - Chest tube to water seal today. Monitor and record output. I will repeat CXR this morning  to ensure no significant recurrence  - Continue IVF support - Continue IV Abx (Zosyn) - Monitor abdominal examination   - Anticoagulation; heparin gtt  - Pain control prn    - Mobilization; PT on board; recommending HHPT  - Appreciate medicine assistance   All of the above findings and recommendations were discussed with the patient, and the medical team, and all of patient's questions were answered to her expressed satisfaction.  -- Edison Simon, PA-C [facility] 09/11/2021, 7:46 AM M-F: 7am - 4pm

## 2021-09-11 NOTE — Assessment & Plan Note (Addendum)
Now resolved, Acute respiratory failure likely secondary to atelectasis versus pulmonary edema.  Pneumonia ruled out On room air now Chest x-ray is unchanged.  Echo shows normal LV systolic function and grade 1 diastolic dysfunction. Given 20 mg of IV Lasix once today per primary team

## 2021-09-11 NOTE — Consult Note (Signed)
[place] for heparin drip Indication: atrial fibrillation  Allergies  Allergen Reactions   Baclofen Other (See Comments)   Codeine Nausea Only   Plavix [Clopidogrel]     brusing all over   Sulfa Antibiotics Nausea Only   Latex Rash    Patient Measurements: Height: '5\' 6"'$  (167.6 cm) Weight: 76.2 kg (168 lb) IBW/kg (Calculated) : 59.3 Heparin Dosing Weight: 74.7 kg  Vital Signs: Temp: 98.1 F (36.7 C) (08/07 0850) Temp Source: Oral (08/07 0850) BP: 141/71 (08/07 0850) Pulse Rate: 103 (08/07 0850)  Labs: Recent Labs    09/09/21 0410 09/10/21 0629 09/11/21 0444 09/11/21 1412  HGB 10.0* 9.6* 10.6*  --   HCT 30.6* 30.5* 33.5*  --   PLT 110* 126* 148*  --   HEPARINUNFRC 0.49 0.43 0.24* 0.43  CREATININE 1.85* 1.35* 1.05*  --      Estimated Creatinine Clearance: 42.4 mL/min (A) (by C-G formula based on SCr of 1.05 mg/dL (H)).   Medical History: Past Medical History:  Diagnosis Date   Adenomatous colon polyp    Anemia    Aortic atherosclerosis (HCC)    Arthritis    Barrett's esophagus    Bilateral carotid artery disease ([place])    a.) carotid doppler 07/18/2020: 6-73% RICA, 41-93% LICA; b.) carotid doppler 79/03/4095: 35-32% LICA, no sig RICA; c.) carotid doppler 01/23/2021: 9-92% RICA, 42-68% LICA; d.) carotid doppler 34/19/6222: 97-98% LICA, near norm RICA   CKD (chronic kidney disease), stage III (HCC)    COPD (chronic obstructive pulmonary disease) ([place])    Coronary artery disease 04/21/2004   a.) LHC 04/21/2004 [facility]): EF 66%, 50% dLCx, 75% pLCx, 75% oRCA, 50% pRCA --> transferred to [facility] for CVTS consult; b.) 3v CABG   Dyspnea on exertion    GERD (gastroesophageal reflux disease)    Hiatal hernia    Hyperlipidemia    Hypertension    Long term current use of anticoagulant    a.) reduced dose apixaban   Macular degeneration    OSA on CPAP    PAD (peripheral artery disease) ([place]) 12/10/2016   a.) PTA of BILATERAL  common iliac arteries --> 7 x 26 mm stents placed   PAF (paroxysmal atrial fibrillation) (HCC)    a.) CHA2DS2-VASc = 6 (age x 2, sex, HTN, vascular disease, T2DM);  b.) rate/rhythm maintained on oral metoprolol tartrate; chronically anticoagulated using dose reduced apixaban   Peripheral edema    PSVT (paroxysmal supraventricular tachycardia) ([place])    S/P CABG x 3 04/2004   Schatzki's ring    Scoliosis of thoracolumbar spine    Valvular heart disease    a.) TTE 04/20/2004: EF >55%, mild MR; b.)  TTE 04/10/2021: EF 50-55%, LVH, reduced RV SF, RVE, moderate BAE, mild-mod MR, severe TR.    Medications:  PTA apixaban 2.5 mg BID, last dose 09/01/2021 Heparin 5,000 units SQ TID, last dose 09/08/2021 @ 0530  Assessment: Patient is a 37 YOF with PMH significant for COPD, obstructive sleep apnea, peripheral arterial disease, coronary artery disease status post CABG, stage III chronic kidney disease, status post recent repair of paraesophageal hernia (09/05/21). Presented to [facility] for  robotic repair of paraesophageal hernia. During hospitalization she developed SOB and fever. Currently POD4. Patient was on apixaban 2.5 mg BID PTA an was receiving heparin 5000 units SQ for DVT ppx inpatient. Pharmacy has been consulted for heparin drip (Afib).  8/5 0410 HL 0.49  8/6 0629 HL 0.43  8/7 0444 HL 0.24,  SUBtherapeutic  8/7 1412 HL 0.43  Goal of Therapy:  Heparin level 0.3-0.7 units/ml Monitor platelets by anticoagulation protocol: Yes   Plan:  8/7 14:12 HL = 0.24, therapeutic x 1 Continue Heparin drip at 1250 units/hr.  Recheck HL in 8 hrs for confirmation. Daily CBC while on Heparin drip.   Paulina Fusi, PharmD, BCPS 09/11/2021 2:56 PM

## 2021-09-11 NOTE — Progress Notes (Signed)
PT Cancellation Note  Patient Details Name: Christina [name] MRN: [id number] DOB: 06/10/1935   Cancelled Treatment:    Reason Eval/Treat Not Completed: Other (comment).  Char reviewed and attempted to see pt.  Pt ntoed she was too fatigued to participate with Pt at this time and requested to be seen at later date.  Pt has already ambulated with mobility and did well.  Pt to be seen at later date/time as medically appropriate.   Gwenlyn Saran, PT, DPT 09/11/21, 3:39 PM

## 2021-09-11 NOTE — Assessment & Plan Note (Signed)
Further treatment plans per surgery.

## 2021-09-11 NOTE — Progress Notes (Signed)
Occupational Therapy Treatment Patient Details Name: Christina [name] MRN: [id number] DOB: 05-26-1935 Today's Date: 09/11/2021   History of present illness Christina [name] is an 70yoF who comes to [facility] on 8/1 for scheduled surgery with Dr. Dahlia Byes to address her paraesophogeal hernia. Pt moving well post surgery able to AMB the unit with moblity team twice, then postop day 2, difficulty AMB, breathing, ABD pain- CT revealing of pleural effusion and IR was called for chest tube placement PM 8/3. PMH: CAD s/p CABG x 3, severe tricuspid regurg on echo 2019, COPD, HTN CKD 3a, PAD, chronic dyspnea.   OT comments  Pt seen for OT tx this date. Spouse present and supportive throughout. Pt initially sleepy, but with minimal encouragement and education in benefits of OOB toileting, pt agreeable. Pt required CGA for sup>sit, SBA-CGA for STS transfers from EOB and from [facility] with RW and VC for hand placement, tolerated standing for pericare and clothing mgt with SBA-CGA after set up, and MIN A for sit>sup at end of session. Pt endorsed some fatigue from activity. SpO2 96% on room air at end of session. Placed more appropriate height BSC in room for pt to continue to use. Pt progressing, continues to benefit from skilled OT Services. Continue to recommend [place].    Recommendations for follow up therapy are one component of a multi-disciplinary discharge planning process, led by the attending physician.  Recommendations may be updated based on patient status, additional functional criteria and insurance authorization.    Follow Up Recommendations  Home health OT    Assistance Recommended at Discharge Frequent or constant Supervision/Assistance  Patient can return home with the following  A little help with walking and/or transfers;A little help with bathing/dressing/bathroom;Assistance with cooking/housework;Assist for transportation;Help with stairs or ramp for entrance;Direct supervision/assist for medications  management   Equipment Recommendations  BSC/3in1    Recommendations for Other Services      Precautions / Restrictions Precautions Precautions: Fall Precaution Comments: R chest tube Restrictions Weight Bearing Restrictions: No       Mobility Bed Mobility Overal bed mobility: Needs Assistance Bed Mobility: Supine to Sit, Sit to Supine     Supine to sit: Min guard, HOB elevated Sit to supine: Min assist   General bed mobility comments: MIN A for BLE mgt back to bed, increased time/effort, use of bed rails    Transfers Overall transfer level: Needs assistance Equipment used: Rolling walker (2 wheels) Transfers: Sit to/from Stand Sit to Stand: Supervision, Min guard           General transfer comment: SBA-CGA with VC for hand placement     Balance Overall balance assessment: Needs assistance Sitting-balance support: Feet supported Sitting balance-Leahy Scale: Good     Standing balance support: Bilateral upper extremity supported, During functional activity, Single extremity supported, No upper extremity supported Standing balance-Leahy Scale: Fair                             ADL either performed or assessed with clinical judgement   ADL Overall ADL's : Needs assistance/impaired     Grooming: Standing;Set up;Wash/dry hands;Min guard                   Toilet Transfer: Supervision/safety;BSC/3in1;Stand-pivot;Rolling walker (2 wheels);Min guard Toilet Transfer Details (indicate cue type and reason): VC for hand placement Toileting- Clothing Manipulation and Hygiene: Min guard;Sit to/from stand;Set up [place] Details (indicate cue type and reason): set  up for wipes, able to complete in standing without LOB, cues to initiate donning brief/pad which she completed without UE support on RW and CGA            Extremity/Trunk Assessment              Vision       Perception     Praxis      Cognition  Arousal/Alertness: Awake/alert Behavior During Therapy: WFL for tasks assessed/performed                                   General Comments: pt endorses fatigue at start of session but agreeable        Exercises Other Exercises Other Exercises: Pt/spouse educated in benefits of toileting OOB versus relying on purewick for every toileting opportunity    Shoulder Instructions       General Comments      Pertinent Vitals/ Pain       Pain Assessment Pain Assessment: Faces Faces Pain Scale: Hurts a little bit Pain Location: chest tube site Pain Descriptors / Indicators: Grimacing, Guarding Pain Intervention(s): Limited activity within patient's tolerance, Monitored during session, Repositioned  Home Living                                          Prior Functioning/Environment              Frequency  Min 2X/week        Progress Toward Goals  OT Goals(current goals can now be found in the care plan section)  Progress towards OT goals: Progressing toward goals  Acute Rehab OT Goals Patient Stated Goal: go home OT Goal Formulation: With patient Time For Goal Achievement: 09/22/21 Potential to Achieve Goals: Good  Plan Discharge plan remains appropriate;Frequency remains appropriate    Co-evaluation                 AM-PAC OT "6 Clicks" Daily Activity     Outcome Measure   Help from another person eating meals?: None Help from another person taking care of personal grooming?: A Little Help from another person toileting, which includes using toliet, bedpan, or urinal?: A Little Help from another person bathing (including washing, rinsing, drying)?: A Little Help from another person to put on and taking off regular upper body clothing?: None Help from another person to put on and taking off regular lower body clothing?: A Little 6 Click Score: 20    End of Session Equipment Utilized During Treatment: Rolling walker (2  wheels)  OT Visit Diagnosis: Other abnormalities of gait and mobility (R26.89);Muscle weakness (generalized) (M62.81)   Activity Tolerance Patient tolerated treatment well   Patient Left in bed;with call bell/phone within reach;with bed alarm set;with family/visitor present   Nurse Communication          Time: 7893-8101 OT Time Calculation (min): 28 min  Charges: OT General Charges $OT Visit: 1 Visit OT Treatments $Self Care/Home Management : 23-37 mins  Ardeth Perfect., MPH, MS, OTR/L ascom [phone] 09/11/21, 3:27 PM

## 2021-09-11 NOTE — Progress Notes (Signed)
Progress Note   Patient: Christina [name] ZOX:[id number] DOB: 1935-02-21 DOA: 09/05/2021     4 DOS: the patient was seen and examined on 09/11/2021   Brief hospital course:  86 y.o. female with past medical history significant for COPD, obstructive sleep apnea, peripheral arterial disease, coronary artery disease status post CABG, stage III chronic kidney disease, status post recent repair of paraesophageal hernia (09/05/21), right pleural effusion s/p chest tube placement on 8/3 with some concern for possible esophageal injury/leak. Medical consult requested for evaluation of worsening shortness of breath from her baseline associated with a low-grade fever (Tmax of 100.5)  8/4: Stopped metoprolol as patient is hypotensive.  Chest tube to suction 8/5: Chest x-ray unchanged. 8/6: Echo shows normal LV systolic function and grade 1 diastolic dysfunction.  Blood pressure and heart rate trending up so metoprolol restarted.  Kidney function and platelets improving 8/7: Chest tube to waterseal, clear liquid diet started per primary team   Assessment and Plan: * Fever Transient and now resolved Etiology of fever could be secondary to atelectasis versus intra-abdominal infection.  Currently on IV Zosyn.  This could be stopped at discharge Chest x-ray shows diffuse bilateral interstitial pulmonary opacity concerning for edema in the setting of cardiomegaly. CT chest shows improvement on the right hemithorax after chest tube placement Negative blood cultures and respiratory viral panel as well.  COPD (chronic obstructive pulmonary disease) (HCC) Stable and not acutely exacerbated Continue as needed bronchodilator therapy as well as inhaled steroids  Acute respiratory failure with hypoxia ([place]) Now resolved, Acute respiratory failure likely secondary to atelectasis versus pulmonary edema.  Pneumonia ruled out On room air now Chest x-ray is unchanged.  Echo shows normal LV systolic function and grade  1 diastolic dysfunction. Given 20 mg of IV Lasix once today per primary team  PAF (paroxysmal atrial fibrillation) (HCC) Metoprolol for rate control, can go up on the dose if heart rate trends up with ambulation Apixaban on hold.  On heparin drip for now.  Thrombocytopenia (HCC) Platelets improving ,    Latest Ref Rng & Units 09/11/2021    4:44 AM 09/10/2021    6:29 AM 09/09/2021    4:10 AM  CBC  WBC 4.0 - 10.5 K/uL 3.0  3.7  4.5   Hemoglobin 12.0 - 15.0 g/dL 10.6  9.6  10.0   Hematocrit 36.0 - 46.0 % 33.5  30.5  30.6   Platelets 150 - 400 K/uL 148  126  110     S/P repair of paraesophageal hernia Further treatment plans per surgery.  Primary hypertension Continue metoprolol  Stage 3a chronic kidney disease ([place]) Renal function is improving, monitor closely.  Acute worsening likely due to hypotension from ATN.  Blood pressure improving as well  Lab Results  Component Value Date   CREATININE 1.05 (H) 09/11/2021   CREATININE 1.35 (H) 09/10/2021   CREATININE 1.85 (H) 09/09/2021    Coronary artery disease Continue metoprolol        Subjective: She is happy to get started on clear liquid diet.  Feeling better  Physical Exam: Vitals:   09/10/21 1958 09/11/21 0301 09/11/21 0738 09/11/21 0850  BP:  (!) 145/74  (!) 141/71  Pulse:  91  (!) 103  Resp:  20  16  Temp:  97.7 F (36.5 C)  98.1 F (36.7 C)  TempSrc:  Oral  Oral  SpO2: 95% 96% 96% 95%  Weight:      Height:       86 year old female  lying in the bed comfortably without any acute distress Lungs decreased breath sounds at the right base. Chest tube to the right lateral chest wall draining Cardiovascular regular rate and rhythm Abdomen soft, mild incisional tenderness. No rebound or guarding Neuro alert and oriented, nonfocal Psych normal mood and affect Data Reviewed:  Creatinine 1.35  DVT prophylaxis-heparin drip Time spent: 15 minutes  Author: Max Sane, MD 09/11/2021 2:09 PM  For on call review  [email or web address].

## 2021-09-12 ENCOUNTER — Inpatient Hospital Stay: Payer: Medicare Other

## 2021-09-12 DIAGNOSIS — R509 Fever, unspecified: Secondary | ICD-10-CM | POA: Diagnosis not present

## 2021-09-12 DIAGNOSIS — I2510 Atherosclerotic heart disease of native coronary artery without angina pectoris: Secondary | ICD-10-CM | POA: Diagnosis not present

## 2021-09-12 DIAGNOSIS — J449 Chronic obstructive pulmonary disease, unspecified: Secondary | ICD-10-CM | POA: Diagnosis not present

## 2021-09-12 DIAGNOSIS — J9601 Acute respiratory failure with hypoxia: Secondary | ICD-10-CM | POA: Diagnosis not present

## 2021-09-12 LAB — HEPARIN LEVEL (UNFRACTIONATED): Heparin Unfractionated: 0.41 IU/mL (ref 0.30–0.70)

## 2021-09-12 LAB — BASIC METABOLIC PANEL
Anion gap: 4 — ABNORMAL LOW (ref 5–15)
BUN: 19 mg/dL (ref 8–23)
CO2: 21 mmol/L — ABNORMAL LOW (ref 22–32)
Calcium: 7.8 mg/dL — ABNORMAL LOW (ref 8.9–10.3)
Chloride: 116 mmol/L — ABNORMAL HIGH (ref 98–111)
Creatinine, Ser: 1.07 mg/dL — ABNORMAL HIGH (ref 0.44–1.00)
GFR, Estimated: 52 mL/min — ABNORMAL LOW (ref >60)
Glucose, Bld: 101 mg/dL — ABNORMAL HIGH (ref 70–99)
Potassium: 3.4 mmol/L — ABNORMAL LOW (ref 3.5–5.1)
Sodium: 141 mmol/L (ref 135–145)

## 2021-09-12 LAB — TYPE AND SCREEN
ABO/RH(D): A NEG
Antibody Screen: NEGATIVE
Unit division: 0
Unit division: 0

## 2021-09-12 LAB — CBC
HCT: 31.1 % — ABNORMAL LOW (ref 36.0–46.0)
Hemoglobin: 10 g/dL — ABNORMAL LOW (ref 12.0–15.0)
MCH: 32.9 pg (ref 26.0–34.0)
MCHC: 32.2 g/dL (ref 30.0–36.0)
MCV: 102.3 fL — ABNORMAL HIGH (ref 80.0–100.0)
Platelets: 150 10*3/uL (ref 150–400)
RBC: 3.04 MIL/uL — ABNORMAL LOW (ref 3.87–5.11)
RDW: 14.6 % (ref 11.5–15.5)
WBC: 3.8 10*3/uL — ABNORMAL LOW (ref 4.0–10.5)
nRBC: 0.8 % — ABNORMAL HIGH (ref 0.0–0.2)

## 2021-09-12 LAB — CULTURE, BLOOD (ROUTINE X 2)
Culture: NO GROWTH
Culture: NO GROWTH
Special Requests: ADEQUATE
Special Requests: ADEQUATE

## 2021-09-12 LAB — BPAM RBC
Blood Product Expiration Date: 202308102359
Blood Product Expiration Date: 202308122359
Unit Type and Rh: 600
Unit Type and Rh: 600

## 2021-09-12 MED ORDER — ENSURE ENLIVE PO LIQD
237.0000 mL | Freq: Two times a day (BID) | ORAL | Status: DC
  Administered 2021-09-12: 237 mL via ORAL

## 2021-09-12 MED ORDER — PHENYLEPHRINE IN HARD FAT 0.25 % RE SUPP
1.0000 | Freq: Two times a day (BID) | RECTAL | Status: DC | PRN
  Filled 2021-09-12: qty 1

## 2021-09-12 MED ORDER — POTASSIUM CHLORIDE 20 MEQ PO PACK
20.0000 meq | PACK | Freq: Once | ORAL | Status: AC
  Administered 2021-09-12: 20 meq via ORAL
  Filled 2021-09-12: qty 1

## 2021-09-12 MED ORDER — ENSURE ENLIVE PO LIQD
237.0000 mL | Freq: Three times a day (TID) | ORAL | Status: DC
Start: 2021-09-12 — End: 2021-09-15
  Administered 2021-09-14 – 2021-09-15 (×4): 237 mL via ORAL

## 2021-09-12 MED ORDER — ADULT MULTIVITAMIN LIQUID CH
15.0000 mL | Freq: Every day | ORAL | Status: DC
  Administered 2021-09-13 – 2021-09-17 (×5): 15 mL
  Filled 2021-09-12 (×6): qty 15

## 2021-09-12 NOTE — Assessment & Plan Note (Signed)
Transient and now resolved Etiology of fever could be secondary to atelectasis versus intra-abdominal infection.  Currently on IV Zosyn.  This could be stopped at discharge Chest x-ray shows diffuse bilateral interstitial pulmonary opacity concerning for edema in the setting of cardiomegaly. CT chest shows improvement on the right hemithorax after chest tube placement Negative blood cultures and respiratory viral panel as well

## 2021-09-12 NOTE — Assessment & Plan Note (Signed)
Now resolved, Acute respiratory failure likely secondary to atelectasis versus pulmonary edema.  Pneumonia ruled out On room air now Chest x-ray is unchanged.  Echo shows normal LV systolic function and grade 1 diastolic dysfunction.

## 2021-09-12 NOTE — Assessment & Plan Note (Signed)
Continue metoprolol. 

## 2021-09-12 NOTE — Progress Notes (Signed)
[facility] Day(s): 5.   Post op day(s): 7 Days Post-Op.   Interval History:  Patient seen and examined No acute events or new complaints overnight.  Patient reports she is doing okay; looks tired No fever, chills, nausea, emesis Leukopenic; improved; 3.8K Hgb stable to 10.0 Renal function nearly normalized; sCr - 1.07; UO - unmeasured.  Chest drain output: 600 ccs; serosanguinous; no air leak; water sealed Continues on Zosyn  She tolerated CLD; having bowel movements Working with therapies; recommending [place]  Vital signs in last 24 hours: [min-max] current  Temp:  [97.9 F (36.6 C)-98.6 F (37 C)] 98.6 F (37 C) (08/08 0509) Pulse Rate:  [90-103] 100 (08/08 0509) Resp:  [16-20] 20 (08/08 0509) BP: (131-141)/(51-71) 134/71 (08/08 0509) SpO2:  [92 %-97 %] 95 % (08/08 0509)     Height: '5\' 6"'$  (167.6 cm) Weight: 76.2 kg BMI (Calculated): 27.13   Intake/Output last 2 shifts:  08/07 0701 - 08/08 0700 In: 1138.9 [P.O.:340; I.V.:656.2; IV Piggyback:142.7] Out: 600 [Chest Tube:600]   Physical Exam:  Constitutional: alert, cooperative and no distress  Respiratory: breathing non-labored at rest Chest: Chest tube to right lateral chest wall; site is CDI; output serous now; no air leak Cardiovascular: regular rate and sinus rhythm  Gastrointestinal: Soft, incisional soreness, distension and tympany over the stomach, no rebound/guarding. Integumentary: Laparoscopic incisions are CDI with dermabond, no erythema or drainage, some ecchymosis   Labs:     Latest Ref Rng & Units 09/12/2021    5:18 AM 09/11/2021    4:44 AM 09/10/2021    6:29 AM  CBC  WBC 4.0 - 10.5 K/uL 3.8  3.0  3.7   Hemoglobin 12.0 - 15.0 g/dL 10.0  10.6  9.6   Hematocrit 36.0 - 46.0 % 31.1  33.5  30.5   Platelets 150 - 400 K/uL 150  148  126       Latest Ref Rng & Units 09/12/2021    5:18 AM 09/11/2021    4:44 AM 09/10/2021    6:29 AM  CMP  Glucose 70 - 99 mg/dL 101  99   111   BUN 8 - 23 mg/dL 19  21  32   Creatinine 0.44 - 1.00 mg/dL 1.07  1.05  1.35   Sodium 135 - 145 mmol/L 141  145  140   Potassium 3.5 - 5.1 mmol/L 3.4  3.9  3.7   Chloride 98 - 111 mmol/L 116  118  116   CO2 22 - 32 mmol/L '21  20  20   '$ Calcium 8.9 - 10.3 mg/dL 7.8  8.1  7.5      Imaging studies:   CXR + KUB (09/12/2021) personally reviewed showing stable chest tube, no significant recurrence of pleural effusion, ? Gastric distension but no bowel abnormalities, and radiologist report reviewed below:  IMPRESSION: 1. Posterior right pleural drainage catheter with small right pleural effusion, similar to prior. 2. Nonobstructive bowel-gas pattern. Mild residual contrast within the distal colon from yesterday's upper GI.   Assessment/Plan:  86 y.o. female with right pleural effusion s/p chest tube placement (08/03) and concern for possible esophageal injury although work up without evidence of this 7 Days Post-Op s/p robotic assisted laparoscopic paraesophageal hernia repair with Nissen fundoplication.   - Will advance to FLD; may be able to do soft diet for dinner if doing well   - Continue chest tube to water seal; Monitor and record output. CXR in AM - Continue  IV Abx (Zosyn); Day 5 - Monitor abdominal examination   - Anticoagulation; heparin gtt  - Pain control prn    - Mobilization; OT/PT on board; recommending HHPT  - Appreciate medicine assistance    - Discharge Planning; Diet advancing, may be ready for discharge Wednesday vs Thursday (no rush), will watch chest tube and determine need for this at DC; would like output < 200 ccs.   All of the above findings and recommendations were discussed with the patient, and the medical team, and all of patient's questions were answered to her expressed satisfaction.  -- Edison Simon, PA-C [facility] 09/12/2021, 7:54 AM M-F: 7am - 4pm

## 2021-09-12 NOTE — Progress Notes (Addendum)
Progress Note   Patient: Christina [name] ZTI:[id number] DOB: 04-26-1935 DOA: 09/05/2021     5 DOS: the patient was seen and examined on 09/12/2021   Brief hospital course:  86 y.o. female with past medical history significant for COPD, obstructive sleep apnea, peripheral arterial disease, coronary artery disease status post CABG, stage III chronic kidney disease, status post recent repair of paraesophageal hernia (09/05/21), right pleural effusion s/p chest tube placement on 8/3 with some concern for possible esophageal injury/leak. Medical consult requested for evaluation of worsening shortness of breath from her baseline associated with a low-grade fever (Tmax of 100.5)  8/4: Stopped metoprolol as patient is hypotensive.  Chest tube to suction 8/5: Chest x-ray unchanged. 8/6: Echo shows normal LV systolic function and grade 1 diastolic dysfunction.  Blood pressure and heart rate trending up so metoprolol restarted.  Kidney function and platelets improving 8/7: Chest tube to waterseal, clear liquid diet started per primary team 8/8: Full liquid diet today and advance to soft per primary team.  TRH will sign off, please call with any questions.  Discussed with Zack   Assessment and Plan: * Fever Transient and now resolved Etiology of fever could be secondary to atelectasis versus intra-abdominal infection.  Currently on IV Zosyn.  This could be stopped at discharge Chest x-ray shows diffuse bilateral interstitial pulmonary opacity concerning for edema in the setting of cardiomegaly. CT chest shows improvement on the right hemithorax after chest tube placement Negative blood cultures and respiratory viral panel as well  COPD (chronic obstructive pulmonary disease) (HCC) Stable and not acutely exacerbated Continue as needed bronchodilator therapy as well as inhaled steroids  Acute respiratory failure with hypoxia ([place]) Now resolved, Acute respiratory failure likely secondary to atelectasis  versus pulmonary edema.  Pneumonia ruled out On room air now Chest x-ray is unchanged.  Echo shows normal LV systolic function and grade 1 diastolic dysfunction.   PAF (paroxysmal atrial fibrillation) (HCC) Metoprolol for rate control, can go up on the dose if heart rate trends up with ambulation.  Consider 25 mg p.o. twice daily if needed Apixaban on hold.  On heparin drip for now..  Can transition her back to Eliquis at discharge if okay with primary team  Thrombocytopenia [facility]) Platelets improved ,    Latest Ref Rng & Units 09/12/2021    5:18 AM 09/11/2021    4:44 AM 09/10/2021    6:29 AM  CBC  WBC 4.0 - 10.5 K/uL 3.8  3.0  3.7   Hemoglobin 12.0 - 15.0 g/dL 10.0  10.6  9.6   Hematocrit 36.0 - 46.0 % 31.1  33.5  30.5   Platelets 150 - 400 K/uL 150  148  126     S/P repair of paraesophageal hernia Further treatment plans per surgery  Primary hypertension Continue metoprolol.  If heart rate goes up with ambulation/exertion.  Primary team could consider increasing dose of metoprolol to 25 mg twice daily  Stage 3a chronic kidney disease ([place]) Renal function at baseline.  Lab Results  Component Value Date   CREATININE 1.07 (H) 09/12/2021   CREATININE 1.05 (H) 09/11/2021   CREATININE 1.35 (H) 09/10/2021    Coronary artery disease Continue metoprolol.        Subjective: Feeling much better.  Tolerating clear liquid diet  Physical Exam: Vitals:   09/11/21 2007 09/12/21 0509 09/12/21 0851 09/12/21 1517  BP:  134/71 132/77 (!) 141/65  Pulse:  100 99 (!) 109  Resp:  '20 18 18  '$ Temp:  98.6 F (37 C) 98.3 F (36.8 C) 98.2 F (36.8 C)  TempSrc:  Oral Oral Oral  SpO2: 92% 95% 93% 91%  Weight:      Height:       86 year old female lying in the bed comfortably without any acute distress Lungs decreased breath sounds at the right base.  Chest tube to the right lateral chest wall draining Cardiovascular regular rate and rhythm Abdomen soft, mild incisional tenderness.  No  rebound or guarding Neuro alert and oriented, nonfocal Psych normal mood and affect  Data Reviewed:  Potassium 3.4  DVT prophylaxis/heparin  Time spent: 15 minutes  At this time TRH will sign off.  Patient is medically clear for discharge whenever primary team feels appropriate.  Please reach out to [facility] if any questions.  I have discussed this with Zack.  Author: Max Sane, MD 09/12/2021 4:46 PM  For on call review [email or web address].

## 2021-09-12 NOTE — Assessment & Plan Note (Signed)
Metoprolol for rate control, can go up on the dose if heart rate trends up with ambulation.  Consider 25 mg p.o. twice daily if needed Apixaban on hold.  On heparin drip for now..  Can transition her back to Eliquis at discharge if okay with primary team

## 2021-09-12 NOTE — Progress Notes (Signed)
Occupational Therapy Treatment Patient Details Name: LLUVIA GWYNNE MRN: [id number] DOB: 18-May-1935 Today's Date: 09/12/2021   History of present illness Kelse Ploch is an 26yoF who comes to [facility] on 8/1 for scheduled surgery with Dr. Dahlia Byes to address her paraesophogeal hernia. Pt moving well post surgery able to AMB the unit with moblity team twice, then postop day 2, difficulty AMB, breathing, ABD pain- CT revealing of pleural effusion and IR was called for chest tube placement PM 8/3. PMH: CAD s/p CABG x 3, severe tricuspid regurg on echo 2019, COPD, HTN CKD 3a, PAD, chronic dyspnea.   OT comments  Pt received in the recliner, family present, and agreeable to OT session. Pt ambulated in the hall 1 lap around nurse's station wiht SBA-CGA and RW. Pt took 2 brief standing rest breaks during the walk and endorsed feeling 5/10 PRE with exertion. Pt demonstrates improvements in ADL mobility and RW use. SpO2 91-93% during mobility HR up to 124, back down to 90's at rest and SpO2 >95% on room air. Continue to recommend [place].    Recommendations for follow up therapy are one component of a multi-disciplinary discharge planning process, led by the attending physician.  Recommendations may be updated based on patient status, additional functional criteria and insurance authorization.    Follow Up Recommendations  Home health OT    Assistance Recommended at Discharge Frequent or constant Supervision/Assistance  Patient can return home with the following  A little help with walking and/or transfers;A little help with bathing/dressing/bathroom;Assistance with cooking/housework;Assist for transportation;Help with stairs or ramp for entrance;Direct supervision/assist for medications management   Equipment Recommendations  BSC/3in1    Recommendations for Other Services      Precautions / Restrictions Precautions Precautions: Fall Precaution Comments: R chest tube  -to waterseal Restrictions Weight  Bearing Restrictions: No       Mobility Bed Mobility               General bed mobility comments: NT, up in recliner    Transfers Overall transfer level: Needs assistance Equipment used: Rolling walker (2 wheels) Transfers: Sit to/from Stand Sit to Stand: Supervision, Min guard           General transfer comment: SBA-CGA with 1 VC for hand placement     Balance Overall balance assessment: Needs assistance Sitting-balance support: Feet supported Sitting balance-Leahy Scale: Good     Standing balance support: Bilateral upper extremity supported, During functional activity Standing balance-Leahy Scale: Fair                             ADL either performed or assessed with clinical judgement   ADL Overall ADL's : Needs assistance/impaired                                     Functional mobility during ADLs: Min guard;Supervision/safety;Rolling walker (2 wheels)      Extremity/Trunk Assessment              Vision       Perception     Praxis      Cognition Arousal/Alertness: Awake/alert Behavior During Therapy: WFL for tasks assessed/performed Overall Cognitive Status: Within Functional Limits for tasks assessed  Exercises Other Exercises Other Exercises: Pt ambulated in the hall 1 lap around nurse's station wiht SBA-CGA and RW. Pt took 2 brief standing rest breaks during the walk and endorsed feeling 5/10 PRE with exertion.    Shoulder Instructions       General Comments R chest tube to water seal    Pertinent Vitals/ Pain       Pain Assessment Pain Assessment: No/denies pain  Home Living                                          Prior Functioning/Environment              Frequency  Min 2X/week        Progress Toward Goals  OT Goals(current goals can now be found in the care plan section)  Progress towards OT goals:  Progressing toward goals  Acute Rehab OT Goals Patient Stated Goal: go home OT Goal Formulation: With patient Time For Goal Achievement: 09/22/21 Potential to Achieve Goals: Good  Plan Discharge plan remains appropriate;Frequency remains appropriate    Co-evaluation                 AM-PAC OT "6 Clicks" Daily Activity     Outcome Measure   Help from another person eating meals?: None Help from another person taking care of personal grooming?: None Help from another person toileting, which includes using toliet, bedpan, or urinal?: A Little Help from another person bathing (including washing, rinsing, drying)?: A Little Help from another person to put on and taking off regular upper body clothing?: None Help from another person to put on and taking off regular lower body clothing?: A Little 6 Click Score: 21    End of Session Equipment Utilized During Treatment: Rolling walker (2 wheels);Gait belt  OT Visit Diagnosis: Other abnormalities of gait and mobility (R26.89);Muscle weakness (generalized) (M62.81)   Activity Tolerance Patient tolerated treatment well   Patient Left in chair;with call bell/phone within reach;with family/visitor present;with chair alarm set   Nurse Communication          Time: 1420-1440 OT Time Calculation (min): 20 min  Charges: OT General Charges $OT Visit: 1 Visit OT Treatments $Self Care/Home Management : 8-22 mins  Ardeth Perfect., MPH, MS, OTR/L ascom [phone] 09/12/21, 3:53 PM

## 2021-09-12 NOTE — Progress Notes (Signed)
Physical Therapy Treatment Patient Details Name: Christina [name] MRN: [id number] DOB: [birth year] Today's Date: 09/12/2021   History of Present Illness Christina [name] is an 30yoF who comes [facility] on 8/1 for scheduled surgery with Dr. Dahlia Byes to address her paraesophogeal hernia. Pt moving well post surgery able to AMB the unit with moblity team twice, then postop day 2, difficulty AMB, breathing, ABD pain- CT revealing of pleural effusion and IR was called for chest tube placement PM 8/3. PMH: CAD s/p CABG x 3, severe tricuspid regurg on echo 2019, COPD, HTN CKD 3a, PAD, chronic dyspnea.    PT Comments    Pt received in bed, family at bedside and very supportive. Pt on RA, c/o 2/10 discomfort at chest tube insertion. Pt tolerated 380f of gait training on RA with O2 sats remaining between 92-95%. Pt's HR did increase from resting 95bpm to 130's which quickly recovered with seated rest break. Pt continues to improve functionally and will benefit from HHPT once medically cleared.    Recommendations for follow up therapy are one component of a multi-disciplinary discharge planning process, led by the attending physician.  Recommendations may be updated based on patient status, additional functional criteria and insurance authorization.  Follow Up Recommendations  Home health PT     Assistance Recommended at Discharge Frequent or constant Supervision/Assistance  Patient can return home with the following A little help with walking and/or transfers;A lot of help with bathing/dressing/bathroom;Assistance with cooking/housework;Assist for transportation   Equipment Recommendations  Rolling walker (2 wheels) ((?) Pt has a lot of DME at home)    Recommendations for Other Services OT consult     Precautions / Restrictions Precautions Precautions: Fall Precaution Comments: R chest tube (to water seal) Restrictions Weight Bearing Restrictions: No     Mobility  Bed Mobility Overal bed  mobility: Needs Assistance Bed Mobility: Sit to Supine     Supine to sit: Min assist     General bed mobility comments: MinA to transition from side to sit.    Transfers Overall transfer level: Needs assistance Equipment used: Rolling walker (2 wheels) Transfers: Sit to/from Stand Sit to Stand: Supervision, Min guard                Ambulation/Gait Ambulation/Gait assistance: Supervision Gait Distance (Feet): 360 Feet Assistive device: Rolling walker (2 wheels) Gait Pattern/deviations: Step-through pattern Gait velocity: decreased Gait velocity interpretation: <1.31 ft/sec, indicative of household ambulator   General Gait Details: Maintained O2 sats above 92% on RA without c/o SOB   Stairs             Wheelchair Mobility    Modified Rankin (Stroke Patients Only)       Balance Overall balance assessment: Needs assistance Sitting-balance support: Feet supported Sitting balance-Leahy Scale: Good     Standing balance support: Bilateral upper extremity supported, During functional activity Standing balance-Leahy Scale: Fair Standing balance comment: Benefits from B UE support                            Cognition Arousal/Alertness: Awake/alert Behavior During Therapy: WFL for tasks assessed/performed Overall Cognitive Status: Within Functional Limits for tasks assessed                                 General Comments: Pt anxious to get moving        Exercises General Exercises - Lower  Extremity Ankle Circles/Pumps: AROM, Both, 10 reps Long Arc Quad: AROM, Both, 10 reps Other Exercises Other Exercises: Education provided for energy conservation and pacing self during mobility    General Comments General comments (skin integrity, edema, etc.): R chest tube to water seal      Pertinent Vitals/Pain Pain Assessment Pain Assessment: Faces Faces Pain Scale: Hurts a little bit Pain Location: chest tube site Pain  Descriptors / Indicators: Discomfort Pain Intervention(s): Monitored during session    Home Living                          Prior Function            PT Goals (current goals can now be found in the care plan section) Acute Rehab PT Goals Patient Stated Goal: return to home Progress towards PT goals: Progressing toward goals    Frequency    Min 2X/week      PT Plan Current plan remains appropriate    Co-evaluation              AM-PAC PT "6 Clicks" Mobility   Outcome Measure  Help needed turning from your back to your side while in a flat bed without using bedrails?: A Little Help needed moving from lying on your back to sitting on the side of a flat bed without using bedrails?: A Little Help needed moving to and from a bed to a chair (including a wheelchair)?: A Little Help needed standing up from a chair using your arms (e.g., wheelchair or bedside chair)?: A Little Help needed to walk in hospital room?: A Little Help needed climbing 3-5 steps with a railing? : A Little 6 Click Score: 18    End of Session Equipment Utilized During Treatment: Gait belt Activity Tolerance: Patient tolerated treatment well;No increased pain Patient left: in bed;with call bell/phone within reach;with bed alarm set Nurse Communication: Mobility status PT Visit Diagnosis: Other abnormalities of gait and mobility (R26.89);Muscle weakness (generalized) (M62.81);Difficulty in walking, not elsewhere classified (R26.2);Pain;Unsteadiness on feet (R26.81)     Time: 1240-1318 PT Time Calculation (min) (ACUTE ONLY): 38 min  Charges:  $Gait Training: 8-22 mins $Therapeutic Exercise: 8-22 mins $Therapeutic Activity: 8-22 mins                    Mikel Cella, PTA   Christina [name] 09/12/2021, 3:02 PM

## 2021-09-12 NOTE — Assessment & Plan Note (Signed)
Platelets improved ,    Latest Ref Rng & Units 09/12/2021    5:18 AM 09/11/2021    4:44 AM 09/10/2021    6:29 AM  CBC  WBC 4.0 - 10.5 K/uL 3.8  3.0  3.7   Hemoglobin 12.0 - 15.0 g/dL 10.0  10.6  9.6   Hematocrit 36.0 - 46.0 % 31.1  33.5  30.5   Platelets 150 - 400 K/uL 150  148  126

## 2021-09-12 NOTE — Assessment & Plan Note (Signed)
Continue metoprolol.  If heart rate goes up with ambulation/exertion.  Primary team could consider increasing dose of metoprolol to 25 mg twice daily

## 2021-09-12 NOTE — Progress Notes (Addendum)
Initial Nutrition Assessment  DOCUMENTATION CODES:   Not applicable  INTERVENTION:   Ensure Enlive po TID, each supplement provides 350 kcal and 20 grams of protein.  Magic cup TID with meals, each supplement provides 290 kcal and 9 grams of protein  Liquid MVI po daily   Pt at high refeed risk; recommend monitor potassium, magnesium and phosphorus labs daily until stable  Nissen fundoplication diet education  Daily weights   NUTRITION DIAGNOSIS:   Inadequate oral intake related to acute illness as evidenced by meal completion < 50%.  GOAL:   Patient will meet greater than or equal to 90% of their needs  MONITOR:   PO intake, Supplement acceptance, Diet advancement, Labs, Weight trends, Skin, I & O's  REASON FOR ASSESSMENT:   Consult Diet education, Assessment of nutrition requirement/status  ASSESSMENT:   86 y/o female with h/p HLD, HTN, OSA, CAD s/p CABG x 3, COPD, CKD III, HTN, PAF and hiatal hernia s/p robotic assisted laparoscopic repair of  paraesophageal hiatal hernia with Bio-A Mesh 10x 7 cms and Nissen fundoplication 8/1.  -Chest tube in place with 665m output   Met with pt, pt's son and husband in room today. Pt reports decreased appetite and oral intake in hospital. Pt reports that she has been eating foods such as soups and puddings. Pt is documented to be eating ~10% of meals. Pt does report that she drank an Ensure today after breakfast. RD discussed with pt the importance of adequate nutrition needed to preserve lean muscle and to support post op healing. Pt is agreeable to drinking supplements; recommended three supplements per day and daily MVI (liquid or chewable or crushed). Provided pt with Ensure coupons today. Pt and family with numerous questions regarding post op diet recommendations; RD provide pt with Nissen Fundoplication diet education today and provided supporting handouts. RD will add supplements and vitamins to help pt meet her estimated  needs. Pt is likely at refeed risk. No new weight since admission; RD will order daily weights. Pt does appears weight stable at baseline.   RD provided handout on Nissen Fundoplication nutrition therapy. Reviewed clear liquids, full liquids and soft diet. Encouraged pt to avoid caffeine, carbonated beverages and use of straws. Recommend frequent, small meals and increased chewing time. Recommended for pt to remain sitting upright for at least 30 minutes after eating.  Reviewed gas producing foods. Discouraged intake of processed foods and red meat. Recommended use of a liquid multivitamin while following a liquid diet. Reviewed acceptable protein supplements.  RD discussed why it is important for patient to adhere to diet recommendations. Teach back method used.  Medications reviewed and include: KCl, heparin, zosyn  Labs reviewed: K 3.4(L), creat 1.07(H) P 3.9 wnl, Mg 2.2 wnl- 8/5 Wbc- 3.8(L), Hgb 10.0(L), Hct 31.1(L)  NUTRITION - FOCUSED PHYSICAL EXAM:  Flowsheet Row Most Recent Value  Orbital Region No depletion  Upper Arm Region No depletion  Thoracic and Lumbar Region No depletion  Buccal Region No depletion  Temple Region No depletion  Clavicle Bone Region No depletion  Clavicle and Acromion Bone Region No depletion  Scapular Bone Region No depletion  Dorsal Hand No depletion  Patellar Region No depletion  Anterior Thigh Region No depletion  Posterior Calf Region No depletion  Edema (RD Assessment) None  Hair Reviewed  Eyes Reviewed  Mouth Reviewed  Skin Reviewed  Nails Reviewed   Diet Order:   Diet Order  Diet full liquid Room service appropriate? Yes; Fluid consistency: Thin  Diet effective now                  EDUCATION NEEDS:   Education needs have been addressed  Skin:  Skin Assessment: Reviewed RN Assessment  Last BM:  8/7- type 6  Height:   Ht Readings from Last 1 Encounters:  09/05/21 5' 6" (1.676 m)    Weight:   Wt Readings from  Last 1 Encounters:  09/05/21 76.2 kg    Ideal Body Weight:  59 kg  BMI:  Body mass index is 27.12 kg/m.  Estimated Nutritional Needs:   Kcal:  1500-1700kcal/day  Protein:  75-85g/day  Fluid:  1.5-1.7L/day  Casey Campbell MS, RD, LDN Please refer to AMION for RD and/or RD on-call/weekend/after hours pager  

## 2021-09-12 NOTE — Consult Note (Signed)
ANTICOAGULATION CONSULT NOTE   Pharmacy Consult for heparin drip Indication: atrial fibrillation  Allergies  Allergen Reactions   Baclofen Other (See Comments)   Codeine Nausea Only   Plavix [Clopidogrel]     brusing all over   Sulfa Antibiotics Nausea Only   Latex Rash    Patient Measurements: Height: '5\' 6"'$  (167.6 cm) Weight: 76.2 kg (168 lb) IBW/kg (Calculated) : 59.3 Heparin Dosing Weight: 74.7 kg  Vital Signs: Temp: 98.6 F (37 C) (08/08 0509) Temp Source: Oral (08/08 0509) BP: 134/71 (08/08 0509) Pulse Rate: 100 (08/08 0509)  Labs: Recent Labs    09/10/21 0629 09/11/21 0444 09/11/21 1412 09/11/21 2149 09/12/21 0518  HGB 9.6* 10.6*  --   --  10.0*  HCT 30.5* 33.5*  --   --  31.1*  PLT 126* 148*  --   --  150  HEPARINUNFRC 0.43 0.24* 0.43 0.48 0.41  CREATININE 1.35* 1.05*  --   --   --      Estimated Creatinine Clearance: 42.4 mL/min (A) (by C-G formula based on SCr of 1.05 mg/dL (H)).   Medical History: Past Medical History:  Diagnosis Date   Adenomatous colon polyp    Anemia    Aortic atherosclerosis (HCC)    Arthritis    Barrett's esophagus    Bilateral carotid artery disease ([place])    a.) carotid doppler 07/18/2020: 5-85% RICA, 27-78% LICA; b.) carotid doppler 24/23/5361: 44-31% LICA, no sig RICA; c.) carotid doppler 01/23/2021: 5-40% RICA, 08-67% LICA; d.) carotid doppler 61/95/0932: 67-12% LICA, near norm RICA   CKD (chronic kidney disease), stage III (HCC)    COPD (chronic obstructive pulmonary disease) ([place])    Coronary artery disease 04/21/2004   a.) LHC 04/21/2004 [facility]): EF 66%, 50% dLCx, 75% pLCx, 75% oRCA, 50% pRCA --> transferred to [facility] for CVTS consult; b.) 3v CABG   Dyspnea on exertion    GERD (gastroesophageal reflux disease)    Hiatal hernia    Hyperlipidemia    Hypertension    Long term current use of anticoagulant    a.) reduced dose apixaban   Macular degeneration    OSA on CPAP    PAD (peripheral artery disease)  ([place]) 12/10/2016   a.) PTA of BILATERAL common iliac arteries --> 7 x 26 mm stents placed   PAF (paroxysmal atrial fibrillation) (HCC)    a.) CHA2DS2-VASc = 6 (age x 2, sex, HTN, vascular disease, T2DM);  b.) rate/rhythm maintained on oral metoprolol tartrate; chronically anticoagulated using dose reduced apixaban   Peripheral edema    PSVT (paroxysmal supraventricular tachycardia) ([place])    S/P CABG x 3 04/2004   Schatzki's ring    Scoliosis of thoracolumbar spine    Valvular heart disease    a.) TTE 04/20/2004: EF >55%, mild MR; b.)  TTE 04/10/2021: EF 50-55%, LVH, reduced RV SF, RVE, moderate BAE, mild-mod MR, severe TR.    Medications:  PTA apixaban 2.5 mg BID, last dose 09/01/2021 Heparin 5,000 units SQ TID, last dose 09/08/2021 @ 0530  Assessment: Patient is a 69 YOF with PMH significant for COPD, obstructive sleep apnea, peripheral arterial disease, coronary artery disease status post CABG, stage III chronic kidney disease, status post recent repair of paraesophageal hernia (09/05/21). Presented to [facility] for  robotic repair of paraesophageal hernia. During hospitalization she developed SOB and fever. Currently POD4. Patient was on apixaban 2.5 mg BID PTA an was receiving heparin 5000 units SQ for DVT ppx inpatient. Pharmacy has been consulted for heparin  drip (Afib).  8/5 0410 HL 0.49  8/6 0629 HL 0.43  8/7 0444 HL 0.24, SUBtherapeutic  8/7 1412 HL 0.43 8/7 2149 HL 0.48, therapeutic x 2 8/8 0518 HL 0.41, therapeutic x 3  Goal of Therapy:  Heparin level 0.3-0.7 units/ml Monitor platelets by anticoagulation protocol: Yes   Plan:  Continue heparin infusion at 1250 units/hr Will recheck HL in daily w/ AM labs while therapeutic  CBC daily while on heparin  Renda Rolls, PharmD, [facility] 09/12/2021 6:05 AM

## 2021-09-12 NOTE — Assessment & Plan Note (Signed)
Further treatment plans per surgery

## 2021-09-12 NOTE — Assessment & Plan Note (Signed)
Renal function at baseline.  Lab Results  Component Value Date   CREATININE 1.07 (H) 09/12/2021   CREATININE 1.05 (H) 09/11/2021   CREATININE 1.35 (H) 09/10/2021

## 2021-09-13 ENCOUNTER — Inpatient Hospital Stay: Payer: Medicare Other

## 2021-09-13 LAB — HEPARIN LEVEL (UNFRACTIONATED): Heparin Unfractionated: 0.39 IU/mL (ref 0.30–0.70)

## 2021-09-13 LAB — CBC
HCT: 30.2 % — ABNORMAL LOW (ref 36.0–46.0)
Hemoglobin: 9.8 g/dL — ABNORMAL LOW (ref 12.0–15.0)
MCH: 33.7 pg (ref 26.0–34.0)
MCHC: 32.5 g/dL (ref 30.0–36.0)
MCV: 103.8 fL — ABNORMAL HIGH (ref 80.0–100.0)
Platelets: 172 10*3/uL (ref 150–400)
RBC: 2.91 MIL/uL — ABNORMAL LOW (ref 3.87–5.11)
RDW: 14.6 % (ref 11.5–15.5)
WBC: 6.1 10*3/uL (ref 4.0–10.5)
nRBC: 0.7 % — ABNORMAL HIGH (ref 0.0–0.2)

## 2021-09-13 LAB — PHOSPHORUS: Phosphorus: 2.1 mg/dL — ABNORMAL LOW (ref 2.5–4.6)

## 2021-09-13 LAB — MAGNESIUM: Magnesium: 2 mg/dL (ref 1.7–2.4)

## 2021-09-13 MED ORDER — FUROSEMIDE 10 MG/ML IJ SOLN
20.0000 mg | Freq: Once | INTRAMUSCULAR | Status: AC
  Administered 2021-09-13: 20 mg via INTRAVENOUS
  Filled 2021-09-13: qty 4

## 2021-09-13 NOTE — Progress Notes (Signed)
Physical Therapy Treatment Patient Details Name: Christina [name] MRN: [id number] DOB: [birth year] Today's Date: 09/13/2021   History of Present Illness Christina [name] is an 64yoF who comes to [facility] on 8/1 for scheduled surgery with Dr. Dahlia Byes to address her paraesophogeal hernia. Pt moving well post surgery able to AMB the unit with moblity team twice, then postop day 2, difficulty AMB, breathing, ABD pain- CT revealing of pleural effusion and IR was called for chest tube placement PM 8/3. PMH: CAD s/p CABG x 3, severe tricuspid regurg on echo 2019, COPD, HTN CKD 3a, PAD, chronic dyspnea.    PT Comments    Pt seen for B LE strengthening and circulation exercises due to receiving Lasix and declining mobility.  Will continue PT next available date/time.  Recommendations for follow up therapy are one component of a multi-disciplinary discharge planning process, led by the attending physician.  Recommendations may be updated based on patient status, additional functional criteria and insurance authorization.  Follow Up Recommendations  Home health PT     Assistance Recommended at Discharge Frequent or constant Supervision/Assistance  Patient can return home with the following A little help with walking and/or transfers;A lot of help with bathing/dressing/bathroom;Assistance with cooking/housework;Assist for transportation   Equipment Recommendations  Other (comment) (Pt has Rollator at Home)    Recommendations for Other Services OT consult     Precautions / Restrictions Precautions Precautions: Fall Precaution Comments: R chest tube  -to waterseal Restrictions Weight Bearing Restrictions: No     Mobility  Bed Mobility                    Transfers                        Ambulation/Gait                   Stairs             Wheelchair Mobility    Modified Rankin (Stroke Patients Only)       Balance                                             Cognition Arousal/Alertness: Awake/alert Behavior During Therapy: WFL for tasks assessed/performed Overall Cognitive Status: Within Functional Limits for tasks assessed                                 General Comments: Pt receiving lasix, declined gait training        Exercises General Exercises - Lower Extremity Ankle Circles/Pumps: AROM, Both, 10 reps Heel Slides: AROM, Both, 10 reps Hip ABduction/ADduction: AROM, Both, 10 reps Other Exercises Other Exercises: Hip IR/ER, both x 10    General Comments        Pertinent Vitals/Pain Pain Assessment Pain Assessment: No/denies pain    Home Living                          Prior Function            PT Goals (current goals can now be found in the care plan section) Acute Rehab PT Goals Patient Stated Goal: return to home    Frequency    Min 2X/week      PT Plan  Current plan remains appropriate    Co-evaluation              AM-PAC PT "6 Clicks" Mobility   Outcome Measure  Help needed turning from your back to your side while in a flat bed without using bedrails?: A Little Help needed moving from lying on your back to sitting on the side of a flat bed without using bedrails?: A Little Help needed moving to and from a bed to a chair (including a wheelchair)?: A Little Help needed standing up from a chair using your arms (e.g., wheelchair or bedside chair)?: A Little Help needed to walk in hospital room?: A Little Help needed climbing 3-5 steps with a railing? : A Little 6 Click Score: 18    End of Session   Activity Tolerance: Patient tolerated treatment well;No increased pain Patient left: in bed;with call bell/phone within reach;with bed alarm set Nurse Communication: Mobility status PT Visit Diagnosis: Other abnormalities of gait and mobility (R26.89);Muscle weakness (generalized) (M62.81);Difficulty in walking, not elsewhere classified  (R26.2);Pain;Unsteadiness on feet (R26.81)     Time: 5400-8676 PT Time Calculation (min) (ACUTE ONLY): 14 min  Charges:  $Therapeutic Exercise: 8-22 mins                    Mikel Cella, PTA   Josie Dixon 09/13/2021, 5:58 PM

## 2021-09-13 NOTE — Consult Note (Signed)
ANTICOAGULATION CONSULT NOTE   Pharmacy Consult for heparin drip Indication: atrial fibrillation  Allergies  Allergen Reactions   Baclofen Other (See Comments)   Codeine Nausea Only   Plavix [Clopidogrel]     brusing all over   Sulfa Antibiotics Nausea Only   Latex Rash    Patient Measurements: Height: '5\' 6"'$  (167.6 cm) Weight: 79.6 kg (175 lb 7.8 oz) IBW/kg (Calculated) : 59.3 Heparin Dosing Weight: 74.7 kg  Vital Signs: Temp: 99 F (37.2 C) (08/09 0434) BP: 137/84 (08/09 0434) Pulse Rate: 107 (08/09 0434)  Labs: Recent Labs    09/11/21 0444 09/11/21 1412 09/11/21 2149 09/12/21 0518 09/13/21 0538  HGB 10.6*  --   --  10.0* 9.8*  HCT 33.5*  --   --  31.1* 30.2*  PLT 148*  --   --  150 172  HEPARINUNFRC 0.24*   < > 0.48 0.41 0.39  CREATININE 1.05*  --   --  1.07*  --    < > = values in this interval not displayed.     Estimated Creatinine Clearance: 42.4 mL/min (A) (by C-G formula based on SCr of 1.07 mg/dL (H)).   Medical History: Past Medical History:  Diagnosis Date   Adenomatous colon polyp    Anemia    Aortic atherosclerosis (HCC)    Arthritis    Barrett's esophagus    Bilateral carotid artery disease ([place])    a.) carotid doppler 07/18/2020: 3-81% RICA, 82-99% LICA; b.) carotid doppler 37/16/9678: 93-81% LICA, no sig RICA; c.) carotid doppler 01/23/2021: 0-17% RICA, 51-02% LICA; d.) carotid doppler 58/52/7782: 42-35% LICA, near norm RICA   CKD (chronic kidney disease), stage III (HCC)    COPD (chronic obstructive pulmonary disease) ([place])    Coronary artery disease 04/21/2004   a.) LHC 04/21/2004 [facility]): EF 66%, 50% dLCx, 75% pLCx, 75% oRCA, 50% pRCA --> transferred to [facility] for CVTS consult; b.) 3v CABG   Dyspnea on exertion    GERD (gastroesophageal reflux disease)    Hiatal hernia    Hyperlipidemia    Hypertension    Long term current use of anticoagulant    a.) reduced dose apixaban   Macular degeneration    OSA on CPAP    PAD  (peripheral artery disease) ([place]) 12/10/2016   a.) PTA of BILATERAL common iliac arteries --> 7 x 26 mm stents placed   PAF (paroxysmal atrial fibrillation) (HCC)    a.) CHA2DS2-VASc = 6 (age x 2, sex, HTN, vascular disease, T2DM);  b.) rate/rhythm maintained on oral metoprolol tartrate; chronically anticoagulated using dose reduced apixaban   Peripheral edema    PSVT (paroxysmal supraventricular tachycardia) ([place])    S/P CABG x 3 04/2004   Schatzki's ring    Scoliosis of thoracolumbar spine    Valvular heart disease    a.) TTE 04/20/2004: EF >55%, mild MR; b.)  TTE 04/10/2021: EF 50-55%, LVH, reduced RV SF, RVE, moderate BAE, mild-mod MR, severe TR.    Medications:  PTA apixaban 2.5 mg BID, last dose 09/01/2021 Heparin 5,000 units SQ TID, last dose 09/08/2021 @ 0530  Assessment: Patient is a 55 YOF with PMH significant for COPD, obstructive sleep apnea, peripheral arterial disease, coronary artery disease status post CABG, stage III chronic kidney disease, status post recent repair of paraesophageal hernia (09/05/21). Presented to [facility] for  robotic repair of paraesophageal hernia. During hospitalization she developed SOB and fever. Currently POD4. Patient was on apixaban 2.5 mg BID PTA an was receiving heparin 5000 units  SQ for DVT ppx inpatient. Pharmacy has been consulted for heparin drip (Afib).  8/5 0410 HL 0.49  8/6 0629 HL 0.43  8/7 0444 HL 0.24, SUBtherapeutic  8/7 1412 HL 0.43 8/7 2149 HL 0.48, therapeutic x 2 8/8 0518 HL 0.41, therapeutic x 3 8/9 0538 HL 0.39, therapeutic x 4  Goal of Therapy:  Heparin level 0.3-0.7 units/ml Monitor platelets by anticoagulation protocol: Yes   Plan:  Continue heparin infusion at 1250 units/hr Will recheck HL in daily w/ AM labs while therapeutic  CBC daily while on heparin  Renda Rolls, PharmD, [facility] 09/13/2021 6:31 AM

## 2021-09-13 NOTE — Progress Notes (Signed)
Noted some bleeding today from patient's hemorrhoid.  Bleeding was intermittent, appears to be triggered by patient's bowel movements, Z. Olean Ree PA made aware.

## 2021-09-13 NOTE — Progress Notes (Signed)
[facility] Day(s): 6.   Post op day(s): 8 Days Post-Op.   Interval History:  Patient seen and examined No acute events or new complaints overnight.  Patient sitting up in chair; in brighter spirits this AM Intermittent low abdominal pain last night; relieved after BM No fever, chills, nausea, emesis WBC normalized; 6.1K Hgb stable to 9.8 Hypophosphatemia to 2.1 Chest drain output: 380 ccs; serosanguinous; no air leak; water seal Continues on Zosyn  She tolerated FLD; having bowel movements Working with therapies; recommending [place]  Vital signs in last 24 hours: [min-max] current  Temp:  [98.2 F (36.8 C)-99 F (37.2 C)] 98.7 F (37.1 C) (08/09 0743) Pulse Rate:  [93-109] 93 (08/09 0743) Resp:  [16-18] 16 (08/09 0434) BP: (132-141)/(65-84) 133/68 (08/09 0743) SpO2:  [91 %-95 %] 93 % (08/09 0743) Weight:  [79.6 kg] 79.6 kg (08/09 0500)     Height: '5\' 6"'$  (167.6 cm) Weight: 79.6 kg BMI (Calculated): 28.34   Intake/Output last 2 shifts:  08/08 0701 - 08/09 0700 In: 580.3 [I.V.:427.5; IV Piggyback:152.8] Out: 582 [Urine:201; Stool:1; Chest Tube:380]   Physical Exam:  Constitutional: alert, cooperative and no distress  Respiratory: breathing non-labored at rest Chest: Chest tube to right lateral chest wall; site is CDI; output serous now; no air leak Cardiovascular: regular rate and sinus rhythm  Gastrointestinal: Soft, incisional soreness, distension and tympany over the stomach, no rebound/guarding. Integumentary: Laparoscopic incisions are CDI with dermabond, no erythema or drainage, some ecchymosis  MSK: 1+ pitting edema  Labs:     Latest Ref Rng & Units 09/13/2021    5:38 AM 09/12/2021    5:18 AM 09/11/2021    4:44 AM  CBC  WBC 4.0 - 10.5 K/uL 6.1  3.8  3.0   Hemoglobin 12.0 - 15.0 g/dL 9.8  10.0  10.6   Hematocrit 36.0 - 46.0 % 30.2  31.1  33.5   Platelets 150 - 400 K/uL 172  150  148       Latest Ref Rng & Units  09/12/2021    5:18 AM 09/11/2021    4:44 AM 09/10/2021    6:29 AM  CMP  Glucose 70 - 99 mg/dL 101  99  111   BUN 8 - 23 mg/dL 19  21  32   Creatinine 0.44 - 1.00 mg/dL 1.07  1.05  1.35   Sodium 135 - 145 mmol/L 141  145  140   Potassium 3.5 - 5.1 mmol/L 3.4  3.9  3.7   Chloride 98 - 111 mmol/L 116  118  116   CO2 22 - 32 mmol/L '21  20  20   '$ Calcium 8.9 - 10.3 mg/dL 7.8  8.1  7.5      Imaging studies:   CXR (09/13/2021) personally reviewed showing stable chest tube, no significant recurrence of pleural effusion, and radiologist report reviewed below:  IMPRESSION: Stable small bilateral pleural effusions with bibasilar atelectasis.   Assessment/Plan:  86 y.o. female with right pleural effusion s/p chest tube placement (08/03) and concern for possible esophageal injury although work up without evidence of this 8 Days Post-Op s/p robotic assisted laparoscopic paraesophageal hernia repair with Nissen fundoplication.   - Will advance to soft diet for lunch; reviewed Nissen recommendations; appreciate dietary assistance with education  - Continue chest tube to water seal; output slowing. Monitor and record output. CXR in AM; If output closer to 200 ccs, I think we can remove this tomorrow (08/10).  -  Continue IV Abx (Zosyn); Day 6 - Monitor abdominal examination   - ? Need for another dose lasix today; she does have some pitting edema today 1+  - Anticoagulation; heparin gtt  - Pain control prn    - Mobilization; OT/PT on board; recommending [place] assistance    - Discharge Planning; Will keep one more day to monitor chest tube output. If this Is ~200 ccs and remains serous, I think we can DC chest tube and get her home tomorrow as long as she continues to do well clinically.   All of the above findings and recommendations were discussed with the patient, and the medical team, and all of patient's questions were answered to her expressed satisfaction.  -- Edison Simon, PA-C [facility] 09/13/2021, 7:49 AM M-F: 7am - 4pm

## 2021-09-14 LAB — BASIC METABOLIC PANEL
Anion gap: 6 (ref 5–15)
BUN: 12 mg/dL (ref 8–23)
CO2: 24 mmol/L (ref 22–32)
Calcium: 7.7 mg/dL — ABNORMAL LOW (ref 8.9–10.3)
Chloride: 108 mmol/L (ref 98–111)
Creatinine, Ser: 0.92 mg/dL (ref 0.44–1.00)
GFR, Estimated: >60 mL/min (ref >60)
Glucose, Bld: 101 mg/dL — ABNORMAL HIGH (ref 70–99)
Potassium: 3.1 mmol/L — ABNORMAL LOW (ref 3.5–5.1)
Sodium: 138 mmol/L (ref 135–145)

## 2021-09-14 LAB — CBC
HCT: 30.1 % — ABNORMAL LOW (ref 36.0–46.0)
Hemoglobin: 9.8 g/dL — ABNORMAL LOW (ref 12.0–15.0)
MCH: 33.2 pg (ref 26.0–34.0)
MCHC: 32.6 g/dL (ref 30.0–36.0)
MCV: 102 fL — ABNORMAL HIGH (ref 80.0–100.0)
Platelets: 192 10*3/uL (ref 150–400)
RBC: 2.95 MIL/uL — ABNORMAL LOW (ref 3.87–5.11)
RDW: 14.5 % (ref 11.5–15.5)
WBC: 6 10*3/uL (ref 4.0–10.5)
nRBC: 0.3 % — ABNORMAL HIGH (ref 0.0–0.2)

## 2021-09-14 LAB — AEROBIC/ANAEROBIC CULTURE W GRAM STAIN (SURGICAL/DEEP WOUND): Culture: NO GROWTH

## 2021-09-14 LAB — PROTEIN, TOTAL: Total Protein: 5.6 g/dL — ABNORMAL LOW (ref 6.5–8.1)

## 2021-09-14 LAB — LACTATE DEHYDROGENASE: LDH: 170 U/L (ref 98–192)

## 2021-09-14 LAB — LACTATE DEHYDROGENASE, PLEURAL OR PERITONEAL FLUID: LD, Fluid: 122 U/L — ABNORMAL HIGH (ref 3–23)

## 2021-09-14 LAB — HEPARIN LEVEL (UNFRACTIONATED): Heparin Unfractionated: 0.39 IU/mL (ref 0.30–0.70)

## 2021-09-14 MED ORDER — POTASSIUM CHLORIDE 20 MEQ PO PACK
40.0000 meq | PACK | Freq: Two times a day (BID) | ORAL | Status: DC
  Administered 2021-09-14 – 2021-09-16 (×6): 40 meq via ORAL
  Filled 2021-09-14 (×6): qty 2

## 2021-09-14 MED ORDER — FUROSEMIDE 10 MG/ML IJ SOLN
40.0000 mg | Freq: Once | INTRAMUSCULAR | Status: AC
  Administered 2021-09-14: 40 mg via INTRAVENOUS
  Filled 2021-09-14: qty 4

## 2021-09-14 NOTE — Progress Notes (Signed)
Occupational Therapy Treatment Patient Details Name: Christina [name] MRN: [id number] DOB: 1935/10/22 Today's Date: 09/14/2021   History of present illness Christina [name] is an 53yoF who comes to [facility] on 8/1 for scheduled surgery with Dr. Dahlia Byes to address her paraesophogeal hernia. Pt moving well post surgery able to AMB the unit with moblity team twice, then postop day 2, difficulty AMB, breathing, ABD pain- CT revealing of pleural effusion and IR was called for chest tube placement PM 8/3. PMH: CAD s/p CABG x 3, severe tricuspid regurg on echo 2019, COPD, HTN CKD 3a, PAD, chronic dyspnea.   OT comments  Pt seen for OT tx session. Pt progressing to SBA for bed mobility, VC and SBA to stand from EOB with RW. Pt tolerated taking a few steps with RW and stood at the sink to complete several grooming tasks with intermittent UE support on the counter, no LOB and denied fatigue or SOB while completing. Despite the brief, pad, and purewick in place while standing, pt experienced minor incontinence leading to a small amount of urine on the floor while in standing. OT cleaned up and pt required assist for doffing socks while seated, MIN A for threading new brief c pad over feet in standing. Pt completed pericare and LB dressing over hips in standing with supv-CGA. Overall pt continues to progress towards her goals, demonstrating improved strength and activity tolerance for ADL/mobility. Continue to recommend Retreat.   Recommendations for follow up therapy are one component of a multi-disciplinary discharge planning process, led by the attending physician.  Recommendations may be updated based on patient status, additional functional criteria and insurance authorization.    Follow Up Recommendations  Home health OT    Assistance Recommended at Discharge Frequent or constant Supervision/Assistance  Patient can return home with the following  A little help with walking and/or transfers;A little help with  bathing/dressing/bathroom;Assistance with cooking/housework;Assist for transportation;Help with stairs or ramp for entrance;Direct supervision/assist for medications management   Equipment Recommendations  BSC/3in1    Recommendations for Other Services      Precautions / Restrictions Precautions Precautions: Fall Precaution Comments: R chest tube  -to waterseal Restrictions Weight Bearing Restrictions: No       Mobility Bed Mobility Overal bed mobility: Needs Assistance Bed Mobility: Supine to Sit, Sit to Supine     Supine to sit: Min guard, Supervision Sit to supine: Supervision, Min guard   General bed mobility comments: SBA    Transfers Overall transfer level: Needs assistance Equipment used: Rolling walker (2 wheels) Transfers: Sit to/from Stand Sit to Stand: Supervision, Min guard           General transfer comment: SBA, VC for hand placement     Balance Overall balance assessment: Needs assistance Sitting-balance support: Feet supported Sitting balance-Leahy Scale: Good     Standing balance support: Bilateral upper extremity supported, During functional activity, Single extremity supported, No upper extremity supported Standing balance-Leahy Scale: Fair                             ADL either performed or assessed with clinical judgement   ADL Overall ADL's : Needs assistance/impaired     Grooming: Standing;Supervision/safety;Wash/dry face;Wash/dry hands;Oral care;Applying deodorant       Lower Body Bathing: Sit to/from stand;Min guard;Supervison/ safety Lower Body Bathing Details (indicate cue type and reason): SBA for LB bathing/peribathing in standing with UE support on counter Upper Body Dressing : Sitting;Set up  Lower Body Dressing: Sit to/from stand;Minimal assistance Lower Body Dressing Details (indicate cue type and reason): MIN A to thread clean brief c pad over her feet in standing, pt able to pull up to complete LB dressing  over hips in standing without UE support and no LOB             Functional mobility during ADLs: Supervision/safety;Rolling walker (2 wheels)      Extremity/Trunk Assessment              Vision       Perception     Praxis      Cognition Arousal/Alertness: Awake/alert Behavior During Therapy: WFL for tasks assessed/performed Overall Cognitive Status: Within Functional Limits for tasks assessed                                          Exercises      Shoulder Instructions       General Comments      Pertinent Vitals/ Pain       Pain Assessment Pain Assessment: 0-10 Pain Score: 7  Pain Location: chest tube site - briefly when getting to the EOB on L side Pain Descriptors / Indicators: Discomfort, Aching Pain Intervention(s): Limited activity within patient's tolerance, Monitored during session, Repositioned  Home Living                                          Prior Functioning/Environment              Frequency  Min 2X/week        Progress Toward Goals  OT Goals(current goals can now be found in the care plan section)  Progress towards OT goals: Progressing toward goals  Acute Rehab OT Goals Patient Stated Goal: go home OT Goal Formulation: With patient Time For Goal Achievement: 09/22/21 Potential to Achieve Goals: Good  Plan Discharge plan remains appropriate;Frequency remains appropriate    Co-evaluation                 AM-PAC OT "6 Clicks" Daily Activity     Outcome Measure   Help from another person eating meals?: None Help from another person taking care of personal grooming?: None Help from another person toileting, which includes using toliet, bedpan, or urinal?: A Little Help from another person bathing (including washing, rinsing, drying)?: A Little Help from another person to put on and taking off regular upper body clothing?: None Help from another person to put on and taking off  regular lower body clothing?: A Little 6 Click Score: 21    End of Session Equipment Utilized During Treatment: Rolling walker (2 wheels)  OT Visit Diagnosis: Other abnormalities of gait and mobility (R26.89);Muscle weakness (generalized) (M62.81)   Activity Tolerance Patient tolerated treatment well   Patient Left in bed;with call bell/phone within reach;with bed alarm set;with family/visitor present   Nurse Communication          Time: 4332-9518 OT Time Calculation (min): 37 min  Charges: OT General Charges $OT Visit: 1 Visit OT Treatments $Self Care/Home Management : 23-37 mins  Ardeth Perfect., MPH, MS, OTR/L ascom [phone] 09/14/21, 1:24 PM

## 2021-09-14 NOTE — Progress Notes (Signed)
Christina [name] Day(s): 7.   Post op day(s): 9 Days Post-Op.   Interval History:  Patient seen and examined No acute events or new complaints overnight.  No fever, chills, nausea, emesis WBC normalized; 6.1K Hgb stable to 9.8 Hypophosphatemia to 2.1 Chest drain output: 520 ccs; serosanguinous; no air leak; water seal Continues on Zosyn  She tolerated soft diet; having bowel movements Working with therapies; recommending HH  Vital signs in last 24 hours: [min-max] current  Temp:  [97.9 F (36.6 C)-98.6 F (37 C)] 98 F (36.7 C) (08/10 0410) Pulse Rate:  [95-107] 95 (08/10 0410) Resp:  [16-19] 19 (08/10 0410) BP: (131-144)/(56-64) 144/64 (08/10 0410) SpO2:  [92 %-94 %] 94 % (08/10 0410) FiO2 (%):  [21 %] 21 % (08/09 2208)     Height: '5\' 6"'$  (167.6 cm) Weight: 79.6 kg BMI (Calculated): 28.34   Intake/Output last 2 shifts:  08/09 0701 - 08/10 0700 In: 1223 [P.O.:730; I.V.:363.8; IV Piggyback:129.2] Out: 0630 [Urine:1250; Stool:1; Chest Tube:520]   Physical Exam:  Constitutional: alert, cooperative and no distress  Respiratory: breathing non-labored at rest Chest: Chest tube to right lateral chest wall; site is CDI; output serous now; no air leak Cardiovascular: regular rate and sinus rhythm  Gastrointestinal: Soft, incisional soreness, distension and tympany over the stomach, no rebound/guarding. Integumentary: Laparoscopic incisions are CDI with dermabond, no erythema or drainage, some ecchymosis  MSK: 1+ pitting edema  Labs:     Latest Ref Rng & Units 09/14/2021    5:20 AM 09/13/2021    5:38 AM 09/12/2021    5:18 AM  CBC  WBC 4.0 - 10.5 K/uL 6.0  6.1  3.8   Hemoglobin 12.0 - 15.0 g/dL 9.8  9.8  10.0   Hematocrit 36.0 - 46.0 % 30.1  30.2  31.1   Platelets 150 - 400 K/uL 192  172  150       Latest Ref Rng & Units 09/14/2021    5:20 AM 09/12/2021    5:18 AM 09/11/2021    4:44 AM  CMP  Glucose 70 - 99 mg/dL 101  101  99   BUN  8 - 23 mg/dL '12  19  21   '$ Creatinine 0.44 - 1.00 mg/dL 0.92  1.07  1.05   Sodium 135 - 145 mmol/L 138  141  145   Potassium 3.5 - 5.1 mmol/L 3.1  3.4  3.9   Chloride 98 - 111 mmol/L 108  116  118   CO2 22 - 32 mmol/L '24  21  20   '$ Calcium 8.9 - 10.3 mg/dL 7.7  7.8  8.1      Imaging studies: No new images this morning   Assessment/Plan:  86 y.o. female with right pleural effusion s/p chest tube placement (08/03) and concern for possible esophageal injury although work up without evidence of this 9 Days Post-Op s/p robotic assisted laparoscopic paraesophageal hernia repair with Nissen fundoplication.   - Continue FLD  - Continue chest tube to water seal; output slowing. Monitor and record output. Will get serum and pleural LDH and protein to determine if this effusion is exudative vs transudative (Light's Criteria) - Continue IV Abx (Zosyn); Day 7 - Monitor abdominal examination   - Anticoagulation; heparin gtt  - Pain control prn    - Mobilization; OT/PT on board; recommending HHPT  - Appreciate medicine assistance    - Discharge Planning; Pending CT removal  All of the above findings and recommendations were  discussed with the patient, and the medical team, and all of patient's questions were answered to her expressed satisfaction.  -- Edison Simon, PA-C [facility] 09/14/2021, 7:46 AM M-F: 7am - 4pm

## 2021-09-14 NOTE — Care Management Important Message (Signed)
Important Message  Patient Details  Name: Christina [name] MRN: [id number] Date of Birth: 1935-02-21   Medicare Important Message Given:  Yes     Dannette Barbara 09/14/2021, 1:13 PM

## 2021-09-14 NOTE — Consult Note (Signed)
[place] for heparin drip Indication: atrial fibrillation  Allergies  Allergen Reactions   Baclofen Other (See Comments)   Codeine Nausea Only   Plavix [Clopidogrel]     brusing all over   Sulfa Antibiotics Nausea Only   Latex Rash    Patient Measurements: Height: '5\' 6"'$  (167.6 cm) Weight: 79.6 kg (175 lb 7.8 oz) IBW/kg (Calculated) : 59.3 Heparin Dosing Weight: 74.7 kg  Vital Signs: Temp: 98 F (36.7 C) (08/10 0410) Temp Source: Oral (08/09 2043) BP: 144/64 (08/10 0410) Pulse Rate: 95 (08/10 0410)  Labs: Recent Labs    09/12/21 0518 09/13/21 0538 09/14/21 0520  HGB 10.0* 9.8* 9.8*  HCT 31.1* 30.2* 30.1*  PLT 150 172 192  HEPARINUNFRC 0.41 0.39 0.39  CREATININE 1.07*  --  0.92     Estimated Creatinine Clearance: 49.3 mL/min (by C-G formula based on SCr of 0.92 mg/dL).   Medical History: Past Medical History:  Diagnosis Date   Adenomatous colon polyp    Anemia    Aortic atherosclerosis (HCC)    Arthritis    Barrett's esophagus    Bilateral carotid artery disease ([place])    a.) carotid doppler 07/18/2020: 4-27% RICA, 06-23% LICA; b.) carotid doppler 76/28/3151: 76-16% LICA, no sig RICA; c.) carotid doppler 01/23/2021: 0-73% RICA, 71-06% LICA; d.) carotid doppler 26/94/8546: 27-03% LICA, near norm RICA   CKD (chronic kidney disease), stage III (HCC)    COPD (chronic obstructive pulmonary disease) ([place])    Coronary artery disease 04/21/2004   a.) LHC 04/21/2004 [facility]): EF 66%, 50% dLCx, 75% pLCx, 75% oRCA, 50% pRCA --> transferred to [facility] for CVTS consult; b.) 3v CABG   Dyspnea on exertion    GERD (gastroesophageal reflux disease)    Hiatal hernia    Hyperlipidemia    Hypertension    Long term current use of anticoagulant    a.) reduced dose apixaban   Macular degeneration    OSA on CPAP    PAD (peripheral artery disease) ([place]) 12/10/2016   a.) PTA of BILATERAL common iliac arteries --> 7 x 26 mm stents placed    PAF (paroxysmal atrial fibrillation) (HCC)    a.) CHA2DS2-VASc = 6 (age x 2, sex, HTN, vascular disease, T2DM);  b.) rate/rhythm maintained on oral metoprolol tartrate; chronically anticoagulated using dose reduced apixaban   Peripheral edema    PSVT (paroxysmal supraventricular tachycardia) ([place])    S/P CABG x 3 04/2004   Schatzki's ring    Scoliosis of thoracolumbar spine    Valvular heart disease    a.) TTE 04/20/2004: EF >55%, mild MR; b.)  TTE 04/10/2021: EF 50-55%, LVH, reduced RV SF, RVE, moderate BAE, mild-mod MR, severe TR.    Medications:  PTA apixaban 2.5 mg BID, last dose 09/01/2021 Heparin 5,000 units SQ TID, last dose 09/08/2021 @ 0530  Assessment: Patient is a 32 YOF with PMH significant for COPD, obstructive sleep apnea, peripheral arterial disease, coronary artery disease status post CABG, stage III chronic kidney disease, status post recent repair of paraesophageal hernia (09/05/21). Presented to [facility] for  robotic repair of paraesophageal hernia. During hospitalization she developed SOB and fever. Currently POD4. Patient was on apixaban 2.5 mg BID PTA an was receiving heparin 5000 units SQ for DVT ppx inpatient. Pharmacy has been consulted for heparin drip (Afib).  8/5 0410 HL 0.49  8/6 0629 HL 0.43  8/7 0444 HL 0.24, SUBtherapeutic  8/7 1412 HL 0.43 8/7 2149 HL 0.48, therapeutic x 2  8/8 0518 HL 0.41, therapeutic x 3 8/9 0538 HL 0.39, therapeutic x 4 8/10 0520 HL 0.39, therapeutic x 5  Goal of Therapy:  Heparin level 0.3-0.7 units/ml Monitor platelets by anticoagulation protocol: Yes   Plan:  Continue heparin infusion at 1250 units/hr Will recheck HL in daily w/ AM labs while therapeutic  CBC daily while on heparin  Renda Rolls, PharmD, [facility] 09/14/2021 6:47 AM

## 2021-09-15 ENCOUNTER — Inpatient Hospital Stay: Payer: Medicare Other

## 2021-09-15 LAB — BASIC METABOLIC PANEL
Anion gap: 5 (ref 5–15)
BUN: 14 mg/dL (ref 8–23)
CO2: 25 mmol/L (ref 22–32)
Calcium: 8 mg/dL — ABNORMAL LOW (ref 8.9–10.3)
Chloride: 108 mmol/L (ref 98–111)
Creatinine, Ser: 0.95 mg/dL (ref 0.44–1.00)
GFR, Estimated: 59 mL/min — ABNORMAL LOW (ref >60)
Glucose, Bld: 112 mg/dL — ABNORMAL HIGH (ref 70–99)
Potassium: 3.9 mmol/L (ref 3.5–5.1)
Sodium: 138 mmol/L (ref 135–145)

## 2021-09-15 LAB — CBC
HCT: 30.9 % — ABNORMAL LOW (ref 36.0–46.0)
Hemoglobin: 10 g/dL — ABNORMAL LOW (ref 12.0–15.0)
MCH: 33 pg (ref 26.0–34.0)
MCHC: 32.4 g/dL (ref 30.0–36.0)
MCV: 102 fL — ABNORMAL HIGH (ref 80.0–100.0)
Platelets: 213 10*3/uL (ref 150–400)
RBC: 3.03 MIL/uL — ABNORMAL LOW (ref 3.87–5.11)
RDW: 14.6 % (ref 11.5–15.5)
WBC: 7.6 10*3/uL (ref 4.0–10.5)
nRBC: 0 % (ref 0.0–0.2)

## 2021-09-15 LAB — PROTEIN, BODY FLUID (OTHER): Total Protein, Body Fluid Other: 1.4 g/dL

## 2021-09-15 LAB — HEPARIN LEVEL (UNFRACTIONATED): Heparin Unfractionated: 0.4 IU/mL (ref 0.30–0.70)

## 2021-09-15 LAB — TRIGLYCERIDES, BODY FLUIDS: Triglycerides, Fluid: 21 mg/dL

## 2021-09-15 LAB — BRAIN NATRIURETIC PEPTIDE: B Natriuretic Peptide: 106.5 pg/mL — ABNORMAL HIGH (ref 0.0–100.0)

## 2021-09-15 MED ORDER — MAGNESIUM SULFATE 2 GM/50ML IV SOLN
2.0000 g | Freq: Once | INTRAVENOUS | Status: AC
  Administered 2021-09-15: 2 g via INTRAVENOUS
  Filled 2021-09-15: qty 50

## 2021-09-15 MED ORDER — SPIRONOLACTONE 25 MG PO TABS
25.0000 mg | ORAL_TABLET | Freq: Every day | ORAL | Status: DC
  Administered 2021-09-15 – 2021-09-17 (×3): 25 mg via ORAL
  Filled 2021-09-15 (×3): qty 1

## 2021-09-15 MED ORDER — ENSURE ENLIVE PO LIQD
237.0000 mL | Freq: Three times a day (TID) | ORAL | Status: DC
  Administered 2021-09-15 – 2021-09-18 (×9): 237 mL via ORAL

## 2021-09-15 MED ORDER — POTASSIUM PHOSPHATES 15 MMOLE/5ML IV SOLN
30.0000 mmol | Freq: Once | INTRAVENOUS | Status: AC
  Administered 2021-09-15: 30 mmol via INTRAVENOUS
  Filled 2021-09-15: qty 10

## 2021-09-15 MED ORDER — CARVEDILOL 6.25 MG PO TABS
3.1250 mg | ORAL_TABLET | Freq: Two times a day (BID) | ORAL | Status: DC
  Administered 2021-09-15 – 2021-09-18 (×6): 3.125 mg via ORAL
  Filled 2021-09-15 (×6): qty 1

## 2021-09-15 MED ORDER — ENOXAPARIN SODIUM 80 MG/0.8ML IJ SOSY
1.0000 mg/kg | PREFILLED_SYRINGE | Freq: Two times a day (BID) | INTRAMUSCULAR | Status: DC
  Administered 2021-09-15 – 2021-09-18 (×7): 77.5 mg via SUBCUTANEOUS
  Filled 2021-09-15 (×7): qty 0.78

## 2021-09-15 MED ORDER — TORSEMIDE 20 MG PO TABS
20.0000 mg | ORAL_TABLET | Freq: Every day | ORAL | Status: DC
  Administered 2021-09-15 – 2021-09-17 (×3): 20 mg via ORAL
  Filled 2021-09-15 (×3): qty 1

## 2021-09-15 MED ORDER — ENSURE MAX PROTEIN PO LIQD
11.0000 [oz_av] | Freq: Three times a day (TID) | ORAL | Status: DC
  Filled 2021-09-15: qty 330

## 2021-09-15 NOTE — Consult Note (Signed)
PULMONOLOGY         Date: 09/15/2021,   MRN# [id number] Christina [name] [birth year]     AdmissionWeight: 76.2 kg                 CurrentWeight: 77.1 kg  Referring provider: Dr Dahlia Byes   CHIEF COMPLAINT:   Pleural effusion    HISTORY OF PRESENT ILLNESS   This is a pleasant 86 year old female with a history of colonic polyps, chronic anemia, Barrett's esophagus, bilateral carotid artery disease, CKD, COPD, coronary artery disease status post stenting, OSA on CPAP, macular degeneration, severe GERD with laryngopharyngeal reflux and chronic cough with large paraesophageal hiatal hernia status post surgical repair due to refractory cough.  Patient has developed right pleural effusion and pulmonology consultations placed for further management of pleural effusion on the right.  Pleural fluid is exudate by lights criteria.  Patient had over 1 L output over last 24 hours per right chest tube   PAST MEDICAL HISTORY   Past Medical History:  Diagnosis Date   Adenomatous colon polyp    Anemia    Aortic atherosclerosis (HCC)    Arthritis    Barrett's esophagus    Bilateral carotid artery disease ([place])    a.) carotid doppler 07/18/2020: 7-74% RICA, 12-87% LICA; b.) carotid doppler 86/76/7209: 47-09% LICA, no sig RICA; c.) carotid doppler 01/23/2021: 6-28% RICA, 36-62% LICA; d.) carotid doppler 94/76/5465: 03-54% LICA, near norm RICA   CKD (chronic kidney disease), stage III (HCC)    COPD (chronic obstructive pulmonary disease) ([place])    Coronary artery disease 04/21/2004   a.) LHC 04/21/2004 [facility]): EF 66%, 50% dLCx, 75% pLCx, 75% oRCA, 50% pRCA --> transferred to [facility] for CVTS consult; b.) 3v CABG   Dyspnea on exertion    GERD (gastroesophageal reflux disease)    Hiatal hernia    Hyperlipidemia    Hypertension    Long term current use of anticoagulant    a.) reduced dose apixaban   Macular degeneration    OSA on CPAP    PAD (peripheral artery disease) ([place])  12/10/2016   a.) PTA of BILATERAL common iliac arteries --> 7 x 26 mm stents placed   PAF (paroxysmal atrial fibrillation) (HCC)    a.) CHA2DS2-VASc = 6 (age x 2, sex, HTN, vascular disease, T2DM);  b.) rate/rhythm maintained on oral metoprolol tartrate; chronically anticoagulated using dose reduced apixaban   Peripheral edema    PSVT (paroxysmal supraventricular tachycardia) ([place])    S/P CABG x 3 04/2004   Schatzki's ring    Scoliosis of thoracolumbar spine    Valvular heart disease    a.) TTE 04/20/2004: EF >55%, mild MR; b.)  TTE 04/10/2021: EF 50-55%, LVH, reduced RV SF, RVE, moderate BAE, mild-mod MR, severe TR.     SURGICAL HISTORY   Past Surgical History:  Procedure Laterality Date   ABDOMINAL HYSTERECTOMY  1980   due to dysfunctional uterine bleeding   APPENDECTOMY  1980   BREAST SURGERY Left 2000   biopsy   CARDIAC CATHETERIZATION     COLONOSCOPY     CORONARY ARTERY BYPASS GRAFT  2006   ESOPHAGOGASTRODUODENOSCOPY (EGD) WITH PROPOFOL N/A 06/08/2021   Procedure: ESOPHAGOGASTRODUODENOSCOPY (EGD) WITH PROPOFOL;  Surgeon: [name], MD;  Location: [facility] ENDOSCOPY;  Service: Gastroenterology;  Laterality: N/A;   HEMORRHOID SURGERY     INSERTION OF MESH  09/05/2021   Procedure: INSERTION OF MESH;  Surgeon: Jules Husbands, MD;  Location: [facility] ORS;  Service:  General;;   LOWER EXTREMITY ANGIOGRAPHY Right 02/25/2017   Procedure: LOWER EXTREMITY ANGIOGRAPHY;  Surgeon: Algernon Huxley, MD;  Location: [place] CV LAB;  Service: Cardiovascular;  Laterality: Right;   LOWER EXTREMITY INTERVENTION  02/25/2017   Procedure: LOWER EXTREMITY INTERVENTION;  Surgeon: Algernon Huxley, MD;  Location: [place] [facility] LAB;  Service: Cardiovascular;;   LUNG BIOPSY  1999   Negative   TONSILLECTOMY     XI ROBOTIC ASSISTED PARAESOPHAGEAL HERNIA REPAIR N/A 09/05/2021   Procedure: XI ROBOTIC ASSISTED PARAESOPHAGEAL HERNIA REPAIR, RNFA to assist;  Surgeon: Jules Husbands, MD;  Location: [facility] ORS;  Service:  General;  Laterality: N/A;     FAMILY HISTORY   Family History  Problem Relation Age of Onset   Hypertension Mother    Hyperlipidemia Mother    Alzheimer's disease Mother    CAD Mother    Heart attack Father    Lung disease Sister    Heart disease Sister    Diabetes Paternal Grandfather        Type 2   Hearing loss Son      SOCIAL HISTORY   Social History   Tobacco Use   Smoking status: Former    Packs/day: 0.50    Years: 5.00    Total pack years: 2.50    Types: Cigarettes    Quit date: 04/24/2004    Years since quitting: 17.4   Smokeless tobacco: Never  Vaping Use   Vaping Use: Never used  Substance Use Topics   Alcohol use: No   Drug use: No     MEDICATIONS    Home Medication:  Current Outpatient Rx   Order #: [id number] Class: Print    Current Medication:  Current Facility-Administered Medications:    0.9 %  sodium chloride infusion, , Intravenous, PRN, Pabon, Diego F, MD, Stopped at 09/14/21 1022   albuterol (PROVENTIL) (2.5 MG/3ML) 0.083% nebulizer solution 2.5 mg, 2.5 mg, Nebulization, Q4H PRN, Tylene Fantasia, PA-C, 2.5 mg at 09/07/21 0945   arformoterol (BROVANA) nebulizer solution 15 mcg, 15 mcg, Nebulization, BID, 15 mcg at 09/15/21 0738 **AND** umeclidinium bromide (INCRUSE ELLIPTA) 62.5 MCG/ACT 1 puff, 1 puff, Inhalation, Daily, Pabon, Diego F, MD, 1 puff at 09/15/21 0841   diphenhydrAMINE (BENADRYL) 12.5 MG/5ML elixir 12.5 mg, 12.5 mg, Oral, Q6H PRN **OR** diphenhydrAMINE (BENADRYL) injection 12.5 mg, 12.5 mg, Intravenous, Q6H PRN, Pabon, Diego F, MD   enoxaparin (LOVENOX) injection 77.5 mg, 1 mg/kg, Subcutaneous, Q12H, Vira Blanco, RPH, 77.5 mg at 09/15/21 1336   feeding supplement (ENSURE ENLIVE / ENSURE PLUS) liquid 237 mL, 237 mL, Oral, TID BM, Pabon, Diego F, MD   fentaNYL (SUBLIMAZE) injection, , Intravenous, PRN, Greggory Keen, MD, 25 mcg at 09/07/21 1602   iohexol (OMNIPAQUE) 300 MG/ML solution 60 mL, 60 mL, Intravenous, Once PRN,  Edison Simon R, PA-C   melatonin tablet 2.5 mg, 2.5 mg, Oral, QHS PRN, Pabon, Diego F, MD   metoprolol tartrate (LOPRESSOR) tablet 12.5 mg, 12.5 mg, Oral, BID, Pabon, Diego F, MD, 12.5 mg at 09/15/21 0939   mometasone-formoterol (DULERA) 200-5 MCG/ACT inhaler 2 puff, 2 puff, Inhalation, BID, Pabon, Diego F, MD, 2 puff at 09/15/21 0840   morphine (PF) 2 MG/ML injection 2 mg, 2 mg, Intravenous, Q4H PRN, Pabon, Diego F, MD, 2 mg at 09/13/21 2032   multivitamin liquid 15 mL, 15 mL, Per Tube, Daily, Edison Simon R, PA-C, 15 mL at 09/15/21 0939   ondansetron (ZOFRAN-ODT) disintegrating tablet 4 mg, 4 mg, Oral,  Q6H PRN **OR** ondansetron (ZOFRAN) injection 4 mg, 4 mg, Intravenous, Q6H PRN, Pabon, Diego F, MD, 4 mg at 09/11/21 1415   oxyCODONE (Oxy IR/ROXICODONE) immediate release tablet 5-10 mg, 5-10 mg, Oral, Q4H PRN, Pabon, Diego F, MD   phenylephrine ((USE for PREPARATION-H)) 0.25 % suppository 1 suppository, 1 suppository, Rectal, BID PRN, Pabon, Diego F, MD   potassium chloride (KLOR-CON) packet 40 mEq, 40 mEq, Oral, BID, Pabon, Diego F, MD, 40 mEq at 09/15/21 0939   tobramycin (TOBREX) 0.3 % ophthalmic solution 1 drop, 1 drop, Both Eyes, Q8H PRN, Pabon, Diego F, MD    ALLERGIES   Baclofen, Codeine, Plavix [clopidogrel], Sulfa antibiotics, and Latex     REVIEW OF SYSTEMS    Review of Systems:  Gen:  Denies  fever, sweats, chills weigh loss  HEENT: Denies blurred vision, double vision, ear pain, eye pain, hearing loss, nose bleeds, sore throat Cardiac:  No dizziness, chest pain or heaviness, chest tightness,edema Resp:   reports dyspnea chronically  Gi: Denies swallowing difficulty, stomach pain, nausea or vomiting, diarrhea, constipation, bowel incontinence Gu:  Denies bladder incontinence, burning urine Ext:   Denies Joint pain, stiffness or swelling Skin: Denies  skin rash, easy bruising or bleeding or hives Endoc:  Denies polyuria, polydipsia , polyphagia or weight  change Psych:   Denies depression, insomnia or hallucinations   Other:  All other systems negative   VS: BP (!) 128/54 (BP Location: Left Arm)   Pulse 95   Temp 97.8 F (36.6 C) (Oral)   Resp 17   Ht '5\' 6"'$  (1.676 m)   Wt 77.1 kg   SpO2 92%   BMI 27.43 kg/m      PHYSICAL EXAM    GENERAL:NAD, no fevers, chills, no weakness no fatigue HEAD: Normocephalic, atraumatic.  EYES: Pupils equal, round, reactive to light. Extraocular muscles intact. No scleral icterus.  MOUTH: Moist mucosal membrane. Dentition intact. No abscess noted.  EAR, NOSE, THROAT: Clear without exudates. No external lesions.  NECK: Supple. No thyromegaly. No nodules. No JVD.  PULMONARY: decreased breath sounds with mild rhonchi worse at bases bilaterally.  CARDIOVASCULAR: S1 and S2. Regular rate and rhythm. No murmurs, rubs, or gallops. No edema. Pedal pulses 2+ bilaterally.  GASTROINTESTINAL: Soft, nontender, nondistended. No masses. Positive bowel sounds. No hepatosplenomegaly.  MUSCULOSKELETAL: No swelling, clubbing, 2+edema. Range of motion full in all extremities.  NEUROLOGIC: Cranial nerves II through XII are intact. No gross focal neurological deficits. Sensation intact. Reflexes intact.  SKIN: No ulceration, lesions, rashes, or cyanosis. Skin warm and dry. Turgor intact.  PSYCHIATRIC: Mood, affect within normal limits. The patient is awake, alert and oriented x 3. Insight, judgment intact.       IMAGING   Reviewed serial CT chest and CXR from this month  ASSESSMENT/PLAN   Acute hypoxemic respiratory failure    - s/p paraesophageal hernia repair with significant cardiac history.     - patient with 2+ LE pitting edema and bilateral pleural effusion.  She is exudative by LDH and this may be due to stress of surgery.  I suspect the effusion is related to cardiac strain. BNP is ordered for today. Will diurese with spiranolactone '25mg'$  po and torsemide '20mg'$  po daily for now.  Her BP borderline normal and I  expect this to drop a bit post diuresis.  I will change metoprolol 12.5 to coreg 3.125 bid for now to reduce the hypotension from beta blockade while diuresing.   She has reduced K+ and I  have ordered magnesium 2g repletion and Kphos 37mol repletion   - patient is instructed to use IS at bedside we discussed in detail the correct way to use IS - due to absence of clinical and lab findings to suggest infection it is highly inlikely that fluid is parapneumonic or empyema.  Will continue to follow   Chest tube management    - forced expiratory maenuver with no air leak noted     - total appx 1350 output serosang colored fluid             Thank you for allowing me to participate in the care of this patient.   Patient/Family are satisfied with care plan and all questions have been answered.    Provider disclosure: Patient with at least one acute or chronic illness or injury that poses a threat to life or bodily function and is being managed actively during this encounter.  All of the below services have been performed independently by signing provider:  review of prior documentation from internal and or external health records.  Review of previous and current lab results.  Interview and comprehensive assessment during patient visit today. Review of current and previous chest radiographs/CT scans. Discussion of management and test interpretation with health care team and patient/family.   This document was prepared using Dragon voice recognition software and may include unintentional dictation errors.     FOttie Glazier M.D.  Division of Pulmonary & Critical Care Medicine

## 2021-09-15 NOTE — Progress Notes (Addendum)
CC: s/p paraesophageal hernia Subjective: 1320 cc from chest tube, serous fluid. Negative for chylothorax based on triglycerides from pleural fluid. It is an exudate based on LDH and Richard Light's criteria No other issues other than high output from CT Tolerated diet Cxr looks good Labs ok w nml PL, nml creat  Objective: Vital signs in last 24 hours: Temp:  [97.8 F (36.6 C)-98.5 F (36.9 C)] 97.8 F (36.6 C) (08/11 0902) Pulse Rate:  [95-105] 95 (08/11 0902) Resp:  [17-20] 17 (08/11 0902) BP: (113-128)/(54-64) 128/54 (08/11 0902) SpO2:  [92 %-96 %] 92 % (08/11 0902) Weight:  [77.1 kg] 77.1 kg (08/11 0500) Last BM Date : 09/14/21  Intake/Output from previous day: 08/10 0701 - 08/11 0700 In: 1060.3 [P.O.:680; I.V.:333.6; IV Piggyback:46.7] Out: 2270 [Urine:950; Chest Tube:1320] Intake/Output this shift: Total I/O In: 240 [P.O.:240] Out: 0   Physical exam:  NAD alert Chest: CT in place no air leak, serous fluid from chest tube ( no evidence of milky appearance) Abd : soft, nt incisions c/d/i  Lab Results: CBC  Recent Labs    09/14/21 0520 09/15/21 0456  WBC 6.0 7.6  HGB 9.8* 10.0*  HCT 30.1* 30.9*  PLT 192 213   BMET Recent Labs    09/14/21 0520 09/15/21 0456  NA 138 138  K 3.1* 3.9  CL 108 108  CO2 24 25  GLUCOSE 101* 112*  BUN 12 14  CREATININE 0.92 0.95  CALCIUM 7.7* 8.0*   PT/INR No results for input(s): "LABPROT", "INR" in the last 72 hours. ABG No results for input(s): "PHART", "HCO3" in the last 72 hours.  Invalid input(s): "PCO2", "PO2"  Studies/Results: DG Chest 2 View  Result Date: 09/15/2021 CLINICAL DATA:  [id number]. Right pleural effusion and right chest tube. EXAM: CHEST - 2 VIEW COMPARISON:  PA and lateral chest 09/13/2021. FINDINGS: 7:02 a.m. Again noted is a pigtail drainage tube in the right chest base. There is no visible pneumothorax. Small left pleural effusion with no significant right pleural collection. Bibasilar opacities  appear similar. There is a calcified left lower lobe granuloma in the superior segment. Surgical clips superimposing in the left upper to mid chest. Remaining lungs are generally clear. There is cardiomegaly without evidence of CHF with old CABG. Aortic arch is heavily calcified. IMPRESSION: Stable overall aeration.  No new abnormality. Electronically Signed   By: Telford Nab M.D.   On: 09/15/2021 07:30    Anti-infectives: Anti-infectives (From admission, onward)    Start     Dose/Rate Route Frequency Ordered Stop   09/07/21 1445  piperacillin-tazobactam (ZOSYN) IVPB 3.375 g  Status:  Discontinued        3.375 g 12.5 mL/hr over 240 Minutes Intravenous Every 8 hours 09/07/21 1356 09/14/21 0727   09/05/21 1551  ceFAZolin (ANCEF) 2-4 GM/100ML-% IVPB       Note to Pharmacy: Tippett, Amy J: cabinet override      09/05/21 1551 09/06/21 0359   09/05/21 1530  ceFAZolin (ANCEF) IVPB 2g/100 mL premix        2 g 200 mL/hr over 30 Minutes Intravenous Every 8 hours 09/05/21 1240 09/05/21 2304   09/05/21 0650  ceFAZolin (ANCEF) 2-4 GM/100ML-% IVPB       Note to Pharmacy: Jeanene Erb E: cabinet override      09/05/21 0650 09/05/21 0813   09/05/21 0600  ceFAZolin (ANCEF) IVPB 2g/100 mL premix        2 g 200 mL/hr over 30 Minutes Intravenous On call to  O.R. 09/05/21 0114 09/05/21 0745       Assessment/Plan:  Only issue is high chest tube output No evidence of chylothorax Keep over the weekend due to high output from right chest tube  May need to change to pleurx for chronic effusion We will ask pulmonary to consult Changed to therapeutic lovenox and DC heparin drip     Caroleen Hamman, MD, FACS  09/15/2021

## 2021-09-15 NOTE — Progress Notes (Signed)
Occupational Therapy Treatment Patient Details Name: Christina [name] MRN: [id number] DOB: 04/08/1935 Today's Date: 09/15/2021   History of present illness Christina [name] is an 43yoF who comes [facility] on 8/1 for scheduled surgery with Dr. Dahlia Byes to address her paraesophogeal hernia. Pt moving well post surgery able to AMB the unit with moblity team twice, then postop day 2, difficulty AMB, breathing, ABD pain- CT revealing of pleural effusion and IR was called for chest tube placement PM 8/3. PMH: CAD s/p CABG x 3, severe tricuspid regurg on echo 2019, COPD, HTN CKD 3a, PAD, chronic dyspnea.   OT comments  Pt seen for OT tx after ambulating with mobility specialist 2 laps around NS. Pt instructed in sock aide and reacher and pt able to return demo good technique with initial supervision for safety. Pt completed ADL transfers today with supervision and RW, no VC needed for hand placement. She ambulated in the room with RW and supv + IV pole/chest tube mgt. Pt tolerated completing grooming tasks at the sink with supervision and intermittent UE support on countertop. No LOB noted. Pt progressing well towards goals. Continues to benefit from skilled OT services to maximize safety/indep and return to PLOF.    Recommendations for follow up therapy are one component of a multi-disciplinary discharge planning process, led by the attending physician.  Recommendations may be updated based on patient status, additional functional criteria and insurance authorization.    Follow Up Recommendations  Home health OT    Assistance Recommended at Discharge Frequent or constant Supervision/Assistance  Patient can return home with the following  A little help with walking and/or transfers;A little help with bathing/dressing/bathroom;Assistance with cooking/housework;Assist for transportation;Help with stairs or ramp for entrance;Direct supervision/assist for medications management   Equipment Recommendations   BSC/3in1    Recommendations for Other Services      Precautions / Restrictions Precautions Precautions: Fall Precaution Comments: R chest tube  -to waterseal Restrictions Weight Bearing Restrictions: No       Mobility Bed Mobility               General bed mobility comments: NT, up in recliner at start and end of session    Transfers Overall transfer level: Needs assistance Equipment used: Rolling walker (2 wheels) Transfers: Sit to/from Stand Sit to Stand: Supervision                 Balance Overall balance assessment: Needs assistance Sitting-balance support: Feet supported Sitting balance-Leahy Scale: Good     Standing balance support: No upper extremity supported, During functional activity Standing balance-Leahy Scale: Good                             ADL either performed or assessed with clinical judgement   ADL Overall ADL's : Needs assistance/impaired     Grooming: Standing;Supervision/safety;Wash/dry face;Wash/dry hands;Oral care;Brushing hair               Lower Body Dressing: Supervision/safety;Set up;Sitting/lateral leans;With adaptive equipment Lower Body Dressing Details (indicate cue type and reason): Pt instructed in sock aide and reacher and pt able to return demo good technique with initial supervision for safety                    Extremity/Trunk Assessment              Vision       Perception     Praxis  Cognition Arousal/Alertness: Awake/alert Behavior During Therapy: WFL for tasks assessed/performed Overall Cognitive Status: Within Functional Limits for tasks assessed                                          Exercises      Shoulder Instructions       General Comments      Pertinent Vitals/ Pain       Pain Assessment Pain Assessment: No/denies pain  Home Living                                          Prior Functioning/Environment               Frequency  Min 2X/week        Progress Toward Goals  OT Goals(current goals can now be found in the care plan section)  Progress towards OT goals: Progressing toward goals  Acute Rehab OT Goals Patient Stated Goal: go home OT Goal Formulation: With patient Time For Goal Achievement: 09/22/21 Potential to Achieve Goals: Good  Plan Discharge plan remains appropriate;Frequency remains appropriate    Co-evaluation                 AM-PAC OT "6 Clicks" Daily Activity     Outcome Measure   Help from another person eating meals?: None Help from another person taking care of personal grooming?: None Help from another person toileting, which includes using toliet, bedpan, or urinal?: A Little Help from another person bathing (including washing, rinsing, drying)?: A Little Help from another person to put on and taking off regular upper body clothing?: None Help from another person to put on and taking off regular lower body clothing?: None 6 Click Score: 22    End of Session Equipment Utilized During Treatment: Rolling walker (2 wheels)  OT Visit Diagnosis: Other abnormalities of gait and mobility (R26.89);Muscle weakness (generalized) (M62.81)   Activity Tolerance Patient tolerated treatment well   Patient Left in chair;with call bell/phone within reach   Nurse Communication          Time: 3888-2800 OT Time Calculation (min): 21 min  Charges: OT General Charges $OT Visit: 1 Visit OT Treatments $Self Care/Home Management : 8-22 mins  Ardeth Perfect., MPH, MS, OTR/L ascom [phone] 09/15/21, 11:37 AM

## 2021-09-15 NOTE — Consult Note (Signed)
ANTICOAGULATION CONSULT NOTE   Pharmacy Consult for heparin drip Indication: atrial fibrillation  Allergies  Allergen Reactions   Baclofen Other (See Comments)   Codeine Nausea Only   Plavix [Clopidogrel]     brusing all over   Sulfa Antibiotics Nausea Only   Latex Rash    Patient Measurements: Height: '5\' 6"'$  (167.6 cm) Weight: 79.6 kg (175 lb 7.8 oz) IBW/kg (Calculated) : 59.3 Heparin Dosing Weight: 74.7 kg  Vital Signs: Temp: 98.5 F (36.9 C) (08/10 2037) Temp Source: Oral (08/10 2037) BP: 123/64 (08/10 2037) Pulse Rate: 105 (08/10 2037)  Labs: Recent Labs    09/13/21 0538 09/14/21 0520 09/15/21 0456  HGB 9.8* 9.8* 10.0*  HCT 30.2* 30.1* 30.9*  PLT 172 192 213  HEPARINUNFRC 0.39 0.39 0.40  CREATININE  --  0.92  --      Estimated Creatinine Clearance: 49.3 mL/min (by C-G formula based on SCr of 0.92 mg/dL).   Medical History: Past Medical History:  Diagnosis Date   Adenomatous colon polyp    Anemia    Aortic atherosclerosis (HCC)    Arthritis    Barrett's esophagus    Bilateral carotid artery disease ([place])    a.) carotid doppler 07/18/2020: 6-64% RICA, 40-34% LICA; b.) carotid doppler 74/25/9563: 87-56% LICA, no sig RICA; c.) carotid doppler 01/23/2021: 4-33% RICA, 29-51% LICA; d.) carotid doppler 88/41/6606: 30-16% LICA, near norm RICA   CKD (chronic kidney disease), stage III (HCC)    COPD (chronic obstructive pulmonary disease) ([place])    Coronary artery disease 04/21/2004   a.) LHC 04/21/2004 [facility]): EF 66%, 50% dLCx, 75% pLCx, 75% oRCA, 50% pRCA --> transferred to [facility] for CVTS consult; b.) 3v CABG   Dyspnea on exertion    GERD (gastroesophageal reflux disease)    Hiatal hernia    Hyperlipidemia    Hypertension    Long term current use of anticoagulant    a.) reduced dose apixaban   Macular degeneration    OSA on CPAP    PAD (peripheral artery disease) ([place]) 12/10/2016   a.) PTA of BILATERAL common iliac arteries --> 7 x 26 mm stents  placed   PAF (paroxysmal atrial fibrillation) (HCC)    a.) CHA2DS2-VASc = 6 (age x 2, sex, HTN, vascular disease, T2DM);  b.) rate/rhythm maintained on oral metoprolol tartrate; chronically anticoagulated using dose reduced apixaban   Peripheral edema    PSVT (paroxysmal supraventricular tachycardia) ([place])    S/P CABG x 3 04/2004   Schatzki's ring    Scoliosis of thoracolumbar spine    Valvular heart disease    a.) TTE 04/20/2004: EF >55%, mild MR; b.)  TTE 04/10/2021: EF 50-55%, LVH, reduced RV SF, RVE, moderate BAE, mild-mod MR, severe TR.    Medications:  PTA apixaban 2.5 mg BID, last dose 09/01/2021 Heparin 5,000 units SQ TID, last dose 09/08/2021 @ 0530  Assessment: Patient is a 62 YOF with PMH significant for COPD, obstructive sleep apnea, peripheral arterial disease, coronary artery disease status post CABG, stage III chronic kidney disease, status post recent repair of paraesophageal hernia (09/05/21). Presented to [facility] for  robotic repair of paraesophageal hernia. During hospitalization she developed SOB and fever. Currently POD4. Patient was on apixaban 2.5 mg BID PTA an was receiving heparin 5000 units SQ for DVT ppx inpatient. Pharmacy has been consulted for heparin drip (Afib).  8/5 0410 HL 0.49  8/6 0629 HL 0.43  8/7 0444 HL 0.24, SUBtherapeutic  8/7 1412 HL 0.43 8/7 2149 HL 0.48, therapeutic  x 2 8/8 0518 HL 0.41, therapeutic x 3 8/9 0538 HL 0.39, therapeutic x 4 8/10 0520 HL 0.39, therapeutic x 5 8/11 0456 HL 0.4, therapeutic x 6  Goal of Therapy:  Heparin level 0.3-0.7 units/ml Monitor platelets by anticoagulation protocol: Yes   Plan:  Continue heparin infusion at 1250 units/hr Will recheck HL in daily w/ AM labs while therapeutic  CBC daily while on heparin  Renda Rolls, PharmD, [facility] 09/15/2021 5:42 AM

## 2021-09-15 NOTE — Consult Note (Signed)
[place] for transition from heparin drip to therapeutic Enoxaparin Indication: atrial fibrillation  Allergies  Allergen Reactions   Baclofen Other (See Comments)   Codeine Nausea Only   Plavix [Clopidogrel]     brusing all over   Sulfa Antibiotics Nausea Only   Latex Rash    Patient Measurements: Height: '5\' 6"'$  (167.6 cm) Weight: 77.1 kg (169 lb 15.6 oz) IBW/kg (Calculated) : 59.3 Heparin Dosing Weight: 74.7 kg  Vital Signs: Temp: 97.8 F (36.6 C) (08/11 0902) Temp Source: Oral (08/11 0902) BP: 128/54 (08/11 0902) Pulse Rate: 95 (08/11 0902)  Labs: Recent Labs    09/13/21 0538 09/14/21 0520 09/15/21 0456  HGB 9.8* 9.8* 10.0*  HCT 30.2* 30.1* 30.9*  PLT 172 192 213  HEPARINUNFRC 0.39 0.39 0.40  CREATININE  --  0.92 0.95     Estimated Creatinine Clearance: 47 mL/min (by C-G formula based on SCr of 0.95 mg/dL).   Medical History: Past Medical History:  Diagnosis Date   Adenomatous colon polyp    Anemia    Aortic atherosclerosis (HCC)    Arthritis    Barrett's esophagus    Bilateral carotid artery disease ([place])    a.) carotid doppler 07/18/2020: 0-24% RICA, 09-73% LICA; b.) carotid doppler 53/29/9242: 68-34% LICA, no sig RICA; c.) carotid doppler 01/23/2021: 1-96% RICA, 22-29% LICA; d.) carotid doppler 79/89/2119: 41-74% LICA, near norm RICA   CKD (chronic kidney disease), stage III (HCC)    COPD (chronic obstructive pulmonary disease) ([place])    Coronary artery disease 04/21/2004   a.) LHC 04/21/2004 [facility]): EF 66%, 50% dLCx, 75% pLCx, 75% oRCA, 50% pRCA --> transferred to [facility] for CVTS consult; b.) 3v CABG   Dyspnea on exertion    GERD (gastroesophageal reflux disease)    Hiatal hernia    Hyperlipidemia    Hypertension    Long term current use of anticoagulant    a.) reduced dose apixaban   Macular degeneration    OSA on CPAP    PAD (peripheral artery disease) ([place]) 12/10/2016   a.) PTA of BILATERAL common  iliac arteries --> 7 x 26 mm stents placed   PAF (paroxysmal atrial fibrillation) (HCC)    a.) CHA2DS2-VASc = 6 (age x 2, sex, HTN, vascular disease, T2DM);  b.) rate/rhythm maintained on oral metoprolol tartrate; chronically anticoagulated using dose reduced apixaban   Peripheral edema    PSVT (paroxysmal supraventricular tachycardia) ([place])    S/P CABG x 3 04/2004   Schatzki's ring    Scoliosis of thoracolumbar spine    Valvular heart disease    a.) TTE 04/20/2004: EF >55%, mild MR; b.)  TTE 04/10/2021: EF 50-55%, LVH, reduced RV SF, RVE, moderate BAE, mild-mod MR, severe TR.    Medications:  PTA apixaban 2.5 mg BID, last dose 09/01/2021 Heparin 5,000 units SQ TID, last dose 09/08/2021 @ 0530  Assessment: [name] with PMH significant for COPD, obstructive sleep apnea, peripheral arterial disease, coronary artery disease status post CABG, stage III chronic kidney disease, status post recent repair of paraesophageal hernia (09/05/21). Presented to [facility] for  robotic repair of paraesophageal hernia. During hospitalization she developed SOB and fever. Currently POD4. Patient was on apixaban 2.5 mg BID PTA an was receiving heparin 5000 units SQ for DVT ppx inpatient. Patient has been on heparin drip (Afib) and will now be transitioned over to therapeutic Enoxaparin.   Plan:  Discontinue Heparin drip Enoxaparin '1mg'$ /kg SQ q12h Monitor CBC, SCr per  protocol  Paulina Fusi, PharmD, BCPS 09/15/2021 12:27 PM

## 2021-09-15 NOTE — Progress Notes (Signed)
Mobility Specialist - Progress Note    09/15/21 1100  Mobility  Activity Ambulated with assistance in hallway  Level of Assistance Standby assist, set-up cues, supervision of patient - no hands on  Assistive Device Front wheel walker  Distance Ambulated (ft) 480 ft  Activity Response Tolerated well  $Mobility charge 1 Mobility    During mobility: 111-128 HR, BP, 93-96% SpO2  Pt sitting in recliner upon arrival using RA. Completes STS SBA (no hands on) and ambulates 3 laps with supervision -- takes one standing rest break after each lap. Denies chest pain, dizziness and no signs of SOB. Pt descends to chair with proper hand placements and is left in recliner with needs in reach, husband present.   Merrily Brittle Mobility Specialist 09/15/21, 11:16 AM

## 2021-09-15 NOTE — Progress Notes (Signed)
Physical Therapy Treatment Patient Details Name: RAFAELA DINIUS MRN: [id number] DOB: 1936-01-18 Today's Date: 09/15/2021   History of Present Illness Willona Phariss is an 23yoF who comes to [facility] on 8/1 for scheduled surgery with Dr. Dahlia Byes to address her paraesophogeal hernia. Pt moving well post surgery able to AMB the unit with moblity team twice, then postop day 2, difficulty AMB, breathing, ABD pain- CT revealing of pleural effusion and IR was called for chest tube placement PM 8/3. PMH: CAD s/p CABG x 3, severe tricuspid regurg on echo 2019, COPD, HTN CKD 3a, PAD, chronic dyspnea.    PT Comments    Pt seen in pm, assisted with mobility in room with support of RW and supervision. Placed BSC over toilet in bathroom to encourage increased daily mobility. Pt able to transfer on/off commode and provide self hygiene. Pt too fatigued after having large BM and was assisted back to bed. Provided education on positioning to prevent skin breakdown. Pt stated she would walk with nursing/family later in day.   Recommendations for follow up therapy are one component of a multi-disciplinary discharge planning process, led by the attending physician.  Recommendations may be updated based on patient status, additional functional criteria and insurance authorization.  Follow Up Recommendations  Home health PT     Assistance Recommended at Discharge Frequent or constant Supervision/Assistance  Patient can return home with the following A little help with walking and/or transfers;A lot of help with bathing/dressing/bathroom;Assistance with cooking/housework;Assist for transportation   Equipment Recommendations  None recommended by PT    Recommendations for Other Services       Precautions / Restrictions Precautions Precautions: Fall Precaution Comments: R chest tube  -to waterseal Restrictions Weight Bearing Restrictions: No     Mobility  Bed Mobility Overal bed mobility: Needs  Assistance Bed Mobility: Sit to Supine     Supine to sit: Min guard     General bed mobility comments:  (Pt required Min Guard for LE's)    Transfers Overall transfer level: Needs assistance Equipment used: Rolling walker (2 wheels) Transfers: Sit to/from Stand Sit to Stand: Supervision           General transfer comment: Pt able to stand from various surfaces with supervision    Ambulation/Gait Ambulation/Gait assistance: Supervision Gait Distance (Feet): 40 Feet Assistive device: Rolling walker (2 wheels) Gait Pattern/deviations: Step-through pattern Gait velocity: decreased     General Gait Details: Ambulated in room, no SOB   Stairs             Wheelchair Mobility    Modified Rankin (Stroke Patients Only)       Balance Overall balance assessment: Needs assistance Sitting-balance support: Feet supported Sitting balance-Leahy Scale: Good     Standing balance support: No upper extremity supported, During functional activity Standing balance-Leahy Scale: Good                              Cognition Arousal/Alertness: Awake/alert Behavior During Therapy: WFL for tasks assessed/performed Overall Cognitive Status: Within Functional Limits for tasks assessed                                 General Comments: Fatigued after having bowel movement        Exercises General Exercises - Lower Extremity Ankle Circles/Pumps: AROM, Both, 10 reps Long Arc Quad: AROM, Both, 10 reps Hip ABduction/ADduction:  AROM, Both, 10 reps    General Comments General comments (skin integrity, edema, etc.): Pt's hemrrhoid bleeding, nursing notified      Pertinent Vitals/Pain Pain Assessment Pain Assessment: 0-10 Pain Score: 3  Pain Location: chest tube site - briefly when getting to the EOB on L side Pain Descriptors / Indicators: Discomfort, Aching Pain Intervention(s): Monitored during session    Home Living                           Prior Function            PT Goals (current goals can now be found in the care plan section) Acute Rehab PT Goals Patient Stated Goal: return to home Progress towards PT goals: Progressing toward goals    Frequency    Min 2X/week      PT Plan Current plan remains appropriate    Co-evaluation              AM-PAC PT "6 Clicks" Mobility   Outcome Measure  Help needed turning from your back to your side while in a flat bed without using bedrails?: A Little Help needed moving from lying on your back to sitting on the side of a flat bed without using bedrails?: A Little Help needed moving to and from a bed to a chair (including a wheelchair)?: A Little Help needed standing up from a chair using your arms (e.g., wheelchair or bedside chair)?: A Little Help needed to walk in hospital room?: A Little Help needed climbing 3-5 steps with a railing? : A Little 6 Click Score: 18    End of Session   Activity Tolerance: Patient tolerated treatment well;No increased pain Patient left: in bed;with call bell/phone within reach;with bed alarm set Nurse Communication: Mobility status PT Visit Diagnosis: Other abnormalities of gait and mobility (R26.89);Muscle weakness (generalized) (M62.81);Difficulty in walking, not elsewhere classified (R26.2);Pain;Unsteadiness on feet (R26.81) Pain - part of body:  (Chest tube insertion site)     Time: 1410-1434 PT Time Calculation (min) (ACUTE ONLY): 24 min  Charges:  $Gait Training: 8-22 mins $Therapeutic Activity: 8-22 mins                    Mikel Cella, PTA   Josie Dixon 09/15/2021, 2:44 PM

## 2021-09-16 LAB — CBC
HCT: 32 % — ABNORMAL LOW (ref 36.0–46.0)
Hemoglobin: 11.3 g/dL — ABNORMAL LOW (ref 12.0–15.0)
MCH: 36 pg — ABNORMAL HIGH (ref 26.0–34.0)
MCHC: 35.3 g/dL (ref 30.0–36.0)
MCV: 101.9 fL — ABNORMAL HIGH (ref 80.0–100.0)
Platelets: 303 10*3/uL (ref 150–400)
RBC: 3.14 MIL/uL — ABNORMAL LOW (ref 3.87–5.11)
RDW: 15 % (ref 11.5–15.5)
WBC: 10.6 10*3/uL — ABNORMAL HIGH (ref 4.0–10.5)
nRBC: 0 % (ref 0.0–0.2)

## 2021-09-16 NOTE — Progress Notes (Signed)
Mobility Specialist - Progress Note    09/16/21 1500  Mobility  Activity Ambulated with assistance in hallway;Stood at bedside;Dangled on edge of bed  Level of Assistance Standby assist, set-up cues, supervision of patient - no hands on  Assistive Device Front wheel walker  Distance Ambulated (ft) 320 ft  Activity Response Tolerated well  $Mobility charge 1 Mobility   Pt lying in bed upon arrival using RA with family at bedside. Completes bed mobility ModI w/ use of hand rails. Completes STS CGA + vc for proper hand placement to push off of bed. Voices need for urinary output and ambulates to bathroom, needs MinA for STS from toilet due to toilet height. Ambulates SBA 2 laps around NS on RA -- two standing rest breaks, mild SOB noted. Unable to get strong pleth for O2 reading but tolerates well and returns to bed with needs in reach and bed alarm set.  Merrily Brittle Mobility Specialist 09/16/21, 3:45 PM

## 2021-09-16 NOTE — Progress Notes (Signed)
CC: s/p paraesophageal hernia Subjective: 310 mL from chest tube, serous fluid. Negative for chylothorax based on triglycerides from pleural fluid. It is an exudate based on LDH and Richard Light's criteria No other issues other than high output from CT, which appears to be decreasing. Tolerating diet Last Cxr looked good   Objective: Vital signs in last 24 hours: Temp:  [97.6 F (36.4 C)-99.1 F (37.3 C)] 98.7 F (37.1 C) (08/12 0806) Pulse Rate:  [76-100] 98 (08/12 0806) Resp:  [16-18] 18 (08/12 0806) BP: (108-137)/(52-84) 108/54 (08/12 0806) SpO2:  [92 %-96 %] 96 % (08/12 0806) Weight:  [77 kg] 77 kg (08/12 0254) Last BM Date : 09/14/21  Intake/Output from previous day: 08/11 0701 - 08/12 0700 In: 760.6 [P.O.:240; IV Piggyback:520.6] Out: 2335 [Urine:2025; Chest Tube:310] Intake/Output this shift: No intake/output data recorded.  Physical exam:  NAD alert Chest: CT in place no air leak, serous fluid from chest tube ( no evidence of milky appearance) Abd : soft, nt incisions c/d/i  Lab Results: CBC  Recent Labs    09/15/21 0456 09/16/21 0441  WBC 7.6 10.6*  HGB 10.0* 11.3*  HCT 30.9* 32.0*  PLT 213 303    BMET Recent Labs    09/14/21 0520 09/15/21 0456  NA 138 138  K 3.1* 3.9  CL 108 108  CO2 24 25  GLUCOSE 101* 112*  BUN 12 14  CREATININE 0.92 0.95  CALCIUM 7.7* 8.0*    Studies/Results: DG Chest 2 View  Result Date: 09/15/2021 CLINICAL DATA:  [id number]. Right pleural effusion and right chest tube. EXAM: CHEST - 2 VIEW COMPARISON:  PA and lateral chest 09/13/2021. FINDINGS: 7:02 a.m. Again noted is a pigtail drainage tube in the right chest base. There is no visible pneumothorax. Small left pleural effusion with no significant right pleural collection. Bibasilar opacities appear similar. There is a calcified left lower lobe granuloma in the superior segment. Surgical clips superimposing in the left upper to mid chest. Remaining lungs are generally clear.  There is cardiomegaly without evidence of CHF with old CABG. Aortic arch is heavily calcified. IMPRESSION: Stable overall aeration.  No new abnormality. Electronically Signed   By: Telford Nab M.D.   On: 09/15/2021 07:30    Anti-infectives: Anti-infectives (From admission, onward)    Start     Dose/Rate Route Frequency Ordered Stop   09/07/21 1445  piperacillin-tazobactam (ZOSYN) IVPB 3.375 g  Status:  Discontinued        3.375 g 12.5 mL/hr over 240 Minutes Intravenous Every 8 hours 09/07/21 1356 09/14/21 0727   09/05/21 1551  ceFAZolin (ANCEF) 2-4 GM/100ML-% IVPB       Note to Pharmacy: Tippett, Amy J: cabinet override      09/05/21 1551 09/06/21 0359   09/05/21 1530  ceFAZolin (ANCEF) IVPB 2g/100 mL premix        2 g 200 mL/hr over 30 Minutes Intravenous Every 8 hours 09/05/21 1240 09/05/21 2304   09/05/21 0650  ceFAZolin (ANCEF) 2-4 GM/100ML-% IVPB       Note to Pharmacy: Jeanene Erb E: cabinet override      09/05/21 0650 09/05/21 0813   09/05/21 0600  ceFAZolin (ANCEF) IVPB 2g/100 mL premix        2 g 200 mL/hr over 30 Minutes Intravenous On call to O.R. 09/05/21 0114 09/05/21 0745       Assessment/Plan:  Only issue is high chest tube output, hopefully trending down. No evidence of chylothorax To remain inpatient for now due  to high output from right chest tube  May need to change to pleurx for chronic effusion On Lovenox.  Ronny Bacon, M.D., [facility]  09/16/2021 ; 8:26 AM

## 2021-09-17 LAB — BASIC METABOLIC PANEL
Anion gap: 5 (ref 5–15)
BUN: 18 mg/dL (ref 8–23)
CO2: 28 mmol/L (ref 22–32)
Calcium: 8.2 mg/dL — ABNORMAL LOW (ref 8.9–10.3)
Chloride: 100 mmol/L (ref 98–111)
Creatinine, Ser: 0.99 mg/dL (ref 0.44–1.00)
GFR, Estimated: 57 mL/min — ABNORMAL LOW (ref >60)
Glucose, Bld: 121 mg/dL — ABNORMAL HIGH (ref 70–99)
Potassium: 5.2 mmol/L — ABNORMAL HIGH (ref 3.5–5.1)
Sodium: 133 mmol/L — ABNORMAL LOW (ref 135–145)

## 2021-09-17 LAB — CBC
HCT: 29.6 % — ABNORMAL LOW (ref 36.0–46.0)
Hemoglobin: 9.8 g/dL — ABNORMAL LOW (ref 12.0–15.0)
MCH: 33.7 pg (ref 26.0–34.0)
MCHC: 33.1 g/dL (ref 30.0–36.0)
MCV: 101.7 fL — ABNORMAL HIGH (ref 80.0–100.0)
Platelets: 278 10*3/uL (ref 150–400)
RBC: 2.91 MIL/uL — ABNORMAL LOW (ref 3.87–5.11)
RDW: 14.8 % (ref 11.5–15.5)
WBC: 8.6 10*3/uL (ref 4.0–10.5)
nRBC: 0 % (ref 0.0–0.2)

## 2021-09-17 LAB — CHOLESTEROL, BODY FLUID: Cholesterol, Fluid: 17 mg/dL

## 2021-09-17 NOTE — Progress Notes (Signed)
Mobility Specialist - Progress Note    09/17/21 1300  Mobility  Activity Ambulated with assistance in hallway;Stood at bedside;Dangled on edge of bed  Level of Assistance Standby assist, set-up cues, supervision of patient - no hands on  Assistive Device Front wheel walker  Distance Ambulated (ft) 320 ft  Activity Response Tolerated well  $Mobility charge 1 Mobility     Pre-mobility: HR, BP, SpO2 During mobility: HR, BP, SpO2 Post-mobility: HR, BP, SPO2  Pt lying in bed upon arrival using RA. Pt completes bed mobility ModI + extra time. STS with SBA (no hands on) and ambulates 2 laps around NS with SBA -- 2 standing rest breaks taken due to fatigue. O2 89-93%, HR 80-88. Very mild SOB noted but tolerates well. Pt returns to bed with needs in reach and bed alarm set, O2 at 96% upon exit. Family at bedside.  Merrily Brittle Mobility Specialist 09/17/21, 1:17 PM

## 2021-09-17 NOTE — Progress Notes (Signed)
CC: s/p paraesophageal hernia Subjective: 365 mL from chest tube, serous fluid. Negative for chylothorax based on triglycerides from pleural fluid. It is an exudate based on LDH and Richard Light's criteria No other issues other than high output from CT, which appears to be decreasing. Tolerating diet Last Cxr looked good   Objective: Vital signs in last 24 hours: Temp:  [97.4 F (36.3 C)-100.7 F (38.2 C)] 97.4 F (36.3 C) (08/13 0440) Pulse Rate:  [72-98] 79 (08/13 0440) Resp:  [16-20] 20 (08/13 0440) BP: (108-121)/(48-65) 121/65 (08/13 0440) SpO2:  [91 %-96 %] 93 % (08/12 2103) Last BM Date : 09/16/21  Intake/Output from previous day: 08/12 0701 - 08/13 0700 In: 800 [P.O.:800] Out: 1915 [Urine:1550; Chest Tube:365] Intake/Output this shift: Total I/O In: -  Out: 140 [Chest Tube:140]  Physical exam:  NAD alert Chest: CT in place no air leak, serous fluid from chest tube ( no evidence of milky appearance) Abd : soft, nt incisions c/d/i  Lab Results: CBC  Recent Labs    09/16/21 0441 09/17/21 0455  WBC 10.6* 8.6  HGB 11.3* 9.8*  HCT 32.0* 29.6*  PLT 303 278    BMET Recent Labs    09/15/21 0456 09/17/21 0455  NA 138 133*  K 3.9 5.2*  CL 108 100  CO2 25 28  GLUCOSE 112* 121*  BUN 14 18  CREATININE 0.95 0.99  CALCIUM 8.0* 8.2*     Anti-infectives: Anti-infectives (From admission, onward)    Start     Dose/Rate Route Frequency Ordered Stop   09/07/21 1445  piperacillin-tazobactam (ZOSYN) IVPB 3.375 g  Status:  Discontinued        3.375 g 12.5 mL/hr over 240 Minutes Intravenous Every 8 hours 09/07/21 1356 09/14/21 0727   09/05/21 1551  ceFAZolin (ANCEF) 2-4 GM/100ML-% IVPB       Note to Pharmacy: Tippett, Amy J: cabinet override      09/05/21 1551 09/06/21 0359   09/05/21 1530  ceFAZolin (ANCEF) IVPB 2g/100 mL premix        2 g 200 mL/hr over 30 Minutes Intravenous Every 8 hours 09/05/21 1240 09/05/21 2304   09/05/21 0650  ceFAZolin (ANCEF) 2-4  GM/100ML-% IVPB       Note to Pharmacy: Jeanene Erb E: cabinet override      09/05/21 0650 09/05/21 0813   09/05/21 0600  ceFAZolin (ANCEF) IVPB 2g/100 mL premix        2 g 200 mL/hr over 30 Minutes Intravenous On call to O.R. 09/05/21 0114 09/05/21 0745       Assessment/Plan:  Only issue is high chest tube output, hopefully trending down. No evidence of chylothorax To remain inpatient for now due to high output from right chest tube  May need to change to pleurx for chronic effusion On Lovenox.  Ronny Bacon, M.D., [facility]  09/17/2021 ; 7:00 AM

## 2021-09-17 NOTE — TOC Progression Note (Signed)
Transition of Care [employer] [facility]) - Progression Note    Patient Details  Name: Christina [name] MRN: [id number] Date of Birth: Oct 26, 1935  [facility] CM/SW Contact  Izola Price, RN Phone Number: 09/17/2021, 3:08 PM  Clinical Narrative:  8/13: Pulmonary provider note indicates potential need for PleurX on discharge for chronic effusion. On 812 CN on unit notified to request 10 day supplies for discharge while set up with Pleurx is processed. Simmie Davies RN CM      Expected Discharge Plan: [place] Barriers to Discharge: Continued Medical Work up  Expected Discharge Plan and Services Expected Discharge Plan: [place]       Living arrangements for the past 2 months: Single Family Home Expected Discharge Date: 09/06/21               DME Arranged: 3-N-1 DME Agency: [employer] Date DME Agency Contacted: 09/09/21 Time DME Agency Contacted: [phone] Representative spoke with at DME Agency: [place]: PT, OT [place] Agency: [place] ([place]) Date [place]: 09/09/21 Time [place]: 86 Representative spoke with at [place]: [place] ([place]) Interventions    Readmission Risk Interventions    04/10/2021    4:48 PM  Readmission Risk Prevention Plan  Transportation Screening Complete  PCP or Specialist Appt within 5-7 Days Complete  Home Care Screening Patient refused  Medication Review (RN CM) Complete

## 2021-09-17 NOTE — Progress Notes (Signed)
Mobility Specialist - Progress Note   09/17/21 1400  Mobility  Activity Transferred from chair to bed  Level of Assistance Standby assist, set-up cues, supervision of patient - no hands on  Assistive Device Front wheel walker  Distance Ambulated (ft) 4 ft  Activity Response Tolerated well  $Mobility charge 1 Mobility    Merrily Brittle Mobility Specialist 09/17/21, 2:47 PM

## 2021-09-18 ENCOUNTER — Telehealth: Payer: Self-pay

## 2021-09-18 ENCOUNTER — Inpatient Hospital Stay: Payer: Medicare Other

## 2021-09-18 LAB — CBC
HCT: 29.1 % — ABNORMAL LOW (ref 36.0–46.0)
Hemoglobin: 9.8 g/dL — ABNORMAL LOW (ref 12.0–15.0)
MCH: 33.7 pg (ref 26.0–34.0)
MCHC: 33.7 g/dL (ref 30.0–36.0)
MCV: 100 fL (ref 80.0–100.0)
Platelets: 311 10*3/uL (ref 150–400)
RBC: 2.91 MIL/uL — ABNORMAL LOW (ref 3.87–5.11)
RDW: 14.6 % (ref 11.5–15.5)
WBC: 7.9 10*3/uL (ref 4.0–10.5)
nRBC: 0 % (ref 0.0–0.2)

## 2021-09-18 MED ORDER — OXYCODONE HCL 5 MG PO TABS: 5.0000 mg | ORAL_TABLET | Freq: Four times a day (QID) | ORAL | 0 refills | Status: DC | PRN

## 2021-09-18 MED ORDER — TORSEMIDE 20 MG PO TABS
40.0000 mg | ORAL_TABLET | Freq: Every day | ORAL | Status: DC
  Administered 2021-09-18: 40 mg via ORAL
  Filled 2021-09-18: qty 2

## 2021-09-18 MED ORDER — ADULT MULTIVITAMIN W/MINERALS CH
1.0000 | ORAL_TABLET | Freq: Every day | ORAL | Status: DC
  Administered 2021-09-18: 1 via ORAL
  Filled 2021-09-18: qty 1

## 2021-09-18 NOTE — Progress Notes (Signed)
Discharge instructions reviewed with patient and husband including followup visits and new medications.  Understanding was verbalized and all questions were answered.  IV removed without complication; patient tolerated well.  Patient discharged home via wheelchair in stable condition escorted by volunteer staff.

## 2021-09-18 NOTE — Plan of Care (Signed)
  Problem: Education: Goal: Knowledge of General Education information will improve Description: Including pain rating scale, medication(s)/side effects and non-pharmacologic comfort measures Outcome: Progressing   Problem: Activity: Goal: Risk for activity intolerance will decrease Outcome: Progressing   Problem: Nutrition: Goal: Adequate nutrition will be maintained Outcome: Progressing   Problem: Coping: Goal: Level of anxiety will decrease Outcome: Progressing   

## 2021-09-18 NOTE — Discharge Summary (Signed)
[facility] SURGICAL DISCHARGE SUMMARY  Patient ID: Christina [name] MRN: [id number] DOB/AGE: Feb 19, 1935 86 y.o.  Admit date: 09/05/2021 Discharge date: 09/18/2021  Discharge Diagnoses Patient Active Problem List   Diagnosis Date Noted   Thrombocytopenia ([place]) 09/09/2021   Fever 09/07/2021   Acute respiratory failure with hypoxia (HCC)    S/P repair of paraesophageal hernia 09/05/2021   Impaired fasting glucose 04/11/2021   Pneumonia 04/09/2021   PAF (paroxysmal atrial fibrillation) ([place])    Primary hypertension 11/24/2020   Need for influenza vaccination 11/03/2020   Hiatal hernia with gastroesophageal reflux disease without esophagitis 11/03/2020   Atherosclerosis of both carotid arteries 03/18/2020   Memory loss 03/18/2020   Hand arthritis 03/18/2020   Neck pain 03/18/2020   Aortic atherosclerosis ([place]) 10/26/2016   Claudication ([place]) 10/26/2016   COPD (chronic obstructive pulmonary disease) ([place]) 10/22/2016   OSA on CPAP 09/16/2015   Abnormal chest x-ray 01/25/2015   Atrophic vaginitis 01/25/2015   Coronary artery disease 01/25/2015   Stage 3a chronic kidney disease ([place]) 01/25/2015   Bloodgood disease 01/25/2015   Blood glucose elevated 01/25/2015   Hyperlipidemia 01/25/2015   Cannot sleep 01/25/2015   Hypertension 11/02/2014   Acid reflux 11/02/2014   Arthritis 10/19/2014   Adenomatous colon polyp 07/27/2013   Gastric catarrh 07/27/2013   Hiatal hernia 07/27/2013   Barrett esophagus 07/07/2013   Osteopenia 12/13/2009   PAD (peripheral artery disease) ([place]) 09/24/2007   DD (diverticular disease) 12/19/2005    Consultants  Medicine Interventional Radiology Pulmonology   Procedures 09/05/2021:  Robotic assisted laparoscopic paraesophageal repair; Nissen fundoplication  HPI: Christina [name] is a 86 y.o. female with history of hiatal hernia with chronic symptoms who presents to [facility] on [facility] for scheduled repair.   Hospital Course:  Informed consent was obtained and documented, and patient underwent uneventful robotic assisted laparoscopic paraesophageal hernia repair with Nissen fundoplication (Dr Dahlia Byes, [id number]/[id number]).  Post-operatively, patient was doing well on POD1. UGI was obtained which was reassuring. On POD2, she did develop low grade fever to 100.83F. She had medicine consultation and underwent extensive work up including CT Chest, which was concerning for right pleural effusion but also potentially showed esophageal leak. Interventional radiology was consulted and agreed to place chest tube (08/03). She underwent repeat CT Chest (Esophagram) which did not show esophageal leak. She was kept NPO as a precaution. She underwent repeat UGI on 08/07 which was negative and diet was restarted. Chest tube output was trended. Chylothorax was ruled out. She was found to have exudative pleural effusion vis Light's Criteria. Pulmonology was consulted on 08/11. On 08/14, her CXR remained reassuring and output was under <200 ccs. This was removed. The remainder of patient's hospital course was essentially unremarkable, and discharge planning was initiated accordingly with patient safely able to be discharged home with appropriate discharge instructions, pain control, and outpatient follow-up after all of her and her family's questions were answered to their expressed satisfaction.   Discharge Condition: Good    Physical Examination:  Constitutional: alert, cooperative and no distress  Respiratory: breathing non-labored at rest Chest: Chest tube removed Cardiovascular: regular rate and sinus rhythm  Gastrointestinal: Soft, incisional soreness, non-distended, no rebound/guarding. Integumentary: Laparoscopic incisions are CDI with dermabond, no erythema or drainage, some ecchymosis  MSK: No peripheral edema on examination    Allergies as of 09/18/2021       Reactions   Baclofen Other (See Comments)   Codeine Nausea Only   Plavix  [clopidogrel]    brusing all  over   Sulfa Antibiotics Nausea Only   Latex Rash        Medication List     STOP taking these medications    pantoprazole 40 MG tablet Commonly known as: PROTONIX       TAKE these medications    acetaminophen 500 MG tablet Commonly known as: TYLENOL Take 500 mg by mouth every 8 (eight) hours as needed for mild pain or moderate pain.   albuterol 108 (90 Base) MCG/ACT inhaler Commonly known as: VENTOLIN HFA Inhale 2 puffs into the lungs every 6 (six) hours as needed for wheezing or shortness of breath.   apixaban 2.5 MG Tabs tablet Commonly known as: ELIQUIS Take by mouth 2 (two) times daily.   azelastine 0.1 % nasal spray Commonly known as: ASTELIN Place 1 spray into both nostrils daily as needed.   CALCIUM 600+D3 PO Take 1 tablet by mouth daily.   carboxymethylcellulose 0.5 % Soln Commonly known as: REFRESH PLUS 1 drop 3 (three) times daily as needed.   CoQ-10 100 MG capsule Take 1 capsule (100 mg total) by mouth daily.   docusate sodium 100 MG capsule Commonly known as: COLACE Take 200 mg by mouth at bedtime.   hydrochlorothiazide 12.5 MG tablet Commonly known as: HYDRODIURIL Take 12.5 mg by mouth daily.   Metoprolol Tartrate 75 MG Tabs Take 75 mg by mouth 2 (two) times daily.   MULTI-VITAMIN DAILY PO Take 1 tablet by mouth daily.   oxyCODONE 5 MG immediate release tablet Commonly known as: Oxy IR/ROXICODONE Take 1 tablet (5 mg total) by mouth every 6 (six) hours as needed for severe pain or breakthrough pain.   rosuvastatin 20 MG tablet Commonly known as: Crestor Take 1 tablet (20 mg total) by mouth every evening.   Stiolto Respimat 2.5-2.5 MCG/ACT Aers Generic drug: Tiotropium Bromide-Olodaterol Inhale 2 puffs into the lungs in the morning and at bedtime.   Symbicort 160-4.5 MCG/ACT inhaler Generic drug: budesonide-formoterol 1 puff 2 (two) times daily.   tobramycin 0.3 % ophthalmic solution Commonly known  as: TOBREX   vitamin C 1000 MG tablet Take 1,000 mg by mouth daily.               Durable Medical Equipment  (From admission, onward)           Start     Ordered   09/09/21 1437  For home use only DME 3 n 1  Once        09/09/21 1437              Follow-up Information     Pabon, [place] F, MD. Schedule an appointment as soon as possible for a visit on 09/25/2021.   Specialty: General Surgery Why: Go to appointment on 08/21 at 10:45 AM....s/p laparoscopic paraesophageal hernia repair Contact information: [address] [place] [address] [phone]                  Time spent on discharge management including discussion of hospital course, clinical condition, outpatient instructions, prescriptions, and follow up with the patient and members of the medical team: >30 minutes  -- Edison Simon , PA-C [facility]  09/18/2021, 9:25 AM [phone] M-F: 7am - 4pm

## 2021-09-18 NOTE — Progress Notes (Signed)
PULMONOLOGY         Date: 09/18/2021,   MRN# [id number] Christina [name] November 04, 1935     AdmissionWeight: 76.2 kg                 CurrentWeight: 77 kg  Referring provider: Dr Dahlia Byes   CHIEF COMPLAINT:   Pleural effusion    HISTORY OF PRESENT ILLNESS   This is a pleasant 86 year old female with a history of colonic polyps, chronic anemia, Barrett's esophagus, bilateral carotid artery disease, CKD, COPD, coronary artery disease status post stenting, OSA on CPAP, macular degeneration, severe GERD with laryngopharyngeal reflux and chronic cough with large paraesophageal hiatal hernia status post surgical repair due to refractory cough.  Patient has developed right pleural effusion and pulmonology consultations placed for further management of pleural effusion on the right.  Pleural fluid is exudate by lights criteria.  Patient had over 1 L output over last 24 hours per right chest tube  09/18/21-  patient improved, chest tube removed, shes doing better and is cleared for dc home with pulmonary follow up on outpatient.   PAST MEDICAL HISTORY   Past Medical History:  Diagnosis Date   Adenomatous colon polyp    Anemia    Aortic atherosclerosis (HCC)    Arthritis    Barrett's esophagus    Bilateral carotid artery disease ([place])    a.) carotid doppler 07/18/2020: 1-44% RICA, 31-54% LICA; b.) carotid doppler 00/86/7619: 50-93% LICA, no sig RICA; c.) carotid doppler 01/23/2021: 2-67% RICA, 12-45% LICA; d.) carotid doppler 80/99/8338: 25-05% LICA, near norm RICA   CKD (chronic kidney disease), stage III (HCC)    COPD (chronic obstructive pulmonary disease) ([place])    Coronary artery disease 04/21/2004   a.) LHC 04/21/2004 [facility]): EF 66%, 50% dLCx, 75% pLCx, 75% oRCA, 50% pRCA --> transferred to [facility] for CVTS consult; b.) 3v CABG   Dyspnea on exertion    GERD (gastroesophageal reflux disease)    Hiatal hernia    Hyperlipidemia    Hypertension    Long term current use of  anticoagulant    a.) reduced dose apixaban   Macular degeneration    OSA on CPAP    PAD (peripheral artery disease) ([place]) 12/10/2016   a.) PTA of BILATERAL common iliac arteries --> 7 x 26 mm stents placed   PAF (paroxysmal atrial fibrillation) (HCC)    a.) CHA2DS2-VASc = 6 (age x 2, sex, HTN, vascular disease, T2DM);  b.) rate/rhythm maintained on oral metoprolol tartrate; chronically anticoagulated using dose reduced apixaban   Peripheral edema    PSVT (paroxysmal supraventricular tachycardia) ([place])    S/P CABG x 3 04/2004   Schatzki's ring    Scoliosis of thoracolumbar spine    Valvular heart disease    a.) TTE 04/20/2004: EF >55%, mild MR; b.)  TTE 04/10/2021: EF 50-55%, LVH, reduced RV SF, RVE, moderate BAE, mild-mod MR, severe TR.     SURGICAL HISTORY   Past Surgical History:  Procedure Laterality Date   ABDOMINAL HYSTERECTOMY  1980   due to dysfunctional uterine bleeding   APPENDECTOMY  1980   BREAST SURGERY Left 2000   biopsy   CARDIAC CATHETERIZATION     COLONOSCOPY     CORONARY ARTERY BYPASS GRAFT  2006   ESOPHAGOGASTRODUODENOSCOPY (EGD) WITH PROPOFOL N/A 06/08/2021   Procedure: ESOPHAGOGASTRODUODENOSCOPY (EGD) WITH PROPOFOL;  Surgeon: [name], MD;  [facility] ENDOSCOPY;  Service: Gastroenterology;  Laterality: N/A;   HEMORRHOID SURGERY  INSERTION OF MESH  09/05/2021   Procedure: INSERTION OF MESH;  Surgeon: Jules Husbands, MD;  Location: [facility] ORS;  Service: General;;   LOWER EXTREMITY ANGIOGRAPHY Right 02/25/2017   Procedure: LOWER EXTREMITY ANGIOGRAPHY;  Surgeon: Algernon Huxley, MD;  Location: [place] CV LAB;  Service: Cardiovascular;  Laterality: Right;   LOWER EXTREMITY INTERVENTION  02/25/2017   Procedure: LOWER EXTREMITY INTERVENTION;  Surgeon: Algernon Huxley, MD;  Location: [place] CV LAB;  Service: Cardiovascular;;   LUNG BIOPSY  1999   Negative   TONSILLECTOMY     XI ROBOTIC ASSISTED PARAESOPHAGEAL HERNIA REPAIR N/A 09/05/2021   Procedure: XI  ROBOTIC ASSISTED PARAESOPHAGEAL HERNIA REPAIR, RNFA to assist;  Surgeon: Jules Husbands, MD;  Location: [facility] ORS;  Service: General;  Laterality: N/A;     FAMILY HISTORY   Family History  Problem Relation Age of Onset   Hypertension Mother    Hyperlipidemia Mother    Alzheimer's disease Mother    CAD Mother    Heart attack Father    Lung disease Sister    Heart disease Sister    Diabetes Paternal Grandfather        Type 2   Hearing loss Son      SOCIAL HISTORY   Social History   Tobacco Use   Smoking status: Former    Packs/day: 0.50    Years: 5.00    Total pack years: 2.50    Types: Cigarettes    Quit date: 04/24/2004    Years since quitting: 17.4   Smokeless tobacco: Never  Vaping Use   Vaping Use: Never used  Substance Use Topics   Alcohol use: No   Drug use: No     MEDICATIONS    Home Medication:  Current Outpatient Rx   Order #: [id number] Class: Print    Current Medication:  Current Facility-Administered Medications:    0.9 %  sodium chloride infusion, , Intravenous, PRN, Pabon, Diego F, MD, Stopped at 09/14/21 1022   albuterol (PROVENTIL) (2.5 MG/3ML) 0.083% nebulizer solution 2.5 mg, 2.5 mg, Nebulization, Q4H PRN, Tylene Fantasia, PA-C, 2.5 mg at 09/07/21 0945   arformoterol (BROVANA) nebulizer solution 15 mcg, 15 mcg, Nebulization, BID, 15 mcg at 09/17/21 2150 **AND** umeclidinium bromide (INCRUSE ELLIPTA) 62.5 MCG/ACT 1 puff, 1 puff, Inhalation, Daily, Pabon, Diego F, MD, 1 puff at 09/17/21 1000   carvedilol (COREG) tablet 3.125 mg, 3.125 mg, Oral, BID WC, Mattia Liford, MD, 3.125 mg at 09/17/21 1704   enoxaparin (LOVENOX) injection 77.5 mg, 1 mg/kg, Subcutaneous, Q12H, Vira Blanco, RPH, 77.5 mg at 09/18/21 0057   feeding supplement (ENSURE ENLIVE / ENSURE PLUS) liquid 237 mL, 237 mL, Oral, TID BM, Pabon, Diego F, MD, 237 mL at 09/17/21 1704   fentaNYL (SUBLIMAZE) injection, , Intravenous, PRN, Greggory Keen, MD, 25 mcg at 09/07/21 1602    iohexol (OMNIPAQUE) 300 MG/ML solution 60 mL, 60 mL, Intravenous, Once PRN, Edison Simon R, PA-C   melatonin tablet 2.5 mg, 2.5 mg, Oral, QHS PRN, Pabon, Diego F, MD   mometasone-formoterol (DULERA) 200-5 MCG/ACT inhaler 2 puff, 2 puff, Inhalation, BID, Pabon, Diego F, MD, 2 puff at 09/17/21 1001   morphine (PF) 2 MG/ML injection 2 mg, 2 mg, Intravenous, Q4H PRN, Pabon, Diego F, MD, 2 mg at 09/13/21 2032   multivitamin liquid 15 mL, 15 mL, Per Tube, Daily, Edison Simon R, PA-C, 15 mL at 09/17/21 1000   ondansetron (ZOFRAN-ODT) disintegrating tablet 4 mg, 4 mg, Oral, Q6H PRN **  OR** ondansetron (ZOFRAN) injection 4 mg, 4 mg, Intravenous, Q6H PRN, Pabon, Diego F, MD, 4 mg at 09/11/21 1415   oxyCODONE (Oxy IR/ROXICODONE) immediate release tablet 5-10 mg, 5-10 mg, Oral, Q4H PRN, Pabon, Diego F, MD, 5 mg at 09/17/21 2210   phenylephrine ((USE for PREPARATION-H)) 0.25 % suppository 1 suppository, 1 suppository, Rectal, BID PRN, Pabon, Diego F, MD   tobramycin (TOBREX) 0.3 % ophthalmic solution 1 drop, 1 drop, Both Eyes, Q8H PRN, Pabon, Diego F, MD   torsemide (DEMADEX) tablet 40 mg, 40 mg, Oral, Daily, Tarahji Ramthun, MD    ALLERGIES   Baclofen, Codeine, Plavix [clopidogrel], Sulfa antibiotics, and Latex     REVIEW OF SYSTEMS    Review of Systems:  Gen:  Denies  fever, sweats, chills weigh loss  HEENT: Denies blurred vision, double vision, ear pain, eye pain, hearing loss, nose bleeds, sore throat Cardiac:  No dizziness, chest pain or heaviness, chest tightness,edema Resp:   reports dyspnea chronically  Gi: Denies swallowing difficulty, stomach pain, nausea or vomiting, diarrhea, constipation, bowel incontinence Gu:  Denies bladder incontinence, burning urine Ext:   Denies Joint pain, stiffness or swelling Skin: Denies  skin rash, easy bruising or bleeding or hives Endoc:  Denies polyuria, polydipsia , polyphagia or weight change Psych:   Denies depression, insomnia or  hallucinations   Other:  All other systems negative   VS: BP (!) 109/54 (BP Location: Right Arm)   Pulse 92   Temp 98.5 F (36.9 C)   Resp 16   Ht '5\' 6"'$  (1.676 m)   Wt 77 kg   SpO2 92%   BMI 27.40 kg/m      PHYSICAL EXAM    GENERAL:NAD, no fevers, chills, no weakness no fatigue HEAD: Normocephalic, atraumatic.  EYES: Pupils equal, round, reactive to light. Extraocular muscles intact. No scleral icterus.  MOUTH: Moist mucosal membrane. Dentition intact. No abscess noted.  EAR, NOSE, THROAT: Clear without exudates. No external lesions.  NECK: Supple. No thyromegaly. No nodules. No JVD.  PULMONARY: decreased breath sounds with mild rhonchi worse at bases bilaterally.  CARDIOVASCULAR: S1 and S2. Regular rate and rhythm. No murmurs, rubs, or gallops. No edema. Pedal pulses 2+ bilaterally.  GASTROINTESTINAL: Soft, nontender, nondistended. No masses. Positive bowel sounds. No hepatosplenomegaly.  MUSCULOSKELETAL: No swelling, clubbing, 2+edema. Range of motion full in all extremities.  NEUROLOGIC: Cranial nerves II through XII are intact. No gross focal neurological deficits. Sensation intact. Reflexes intact.  SKIN: No ulceration, lesions, rashes, or cyanosis. Skin warm and dry. Turgor intact.  PSYCHIATRIC: Mood, affect within normal limits. The patient is awake, alert and oriented x 3. Insight, judgment intact.       IMAGING   Reviewed serial CT chest and CXR from this month  ASSESSMENT/PLAN   Acute hypoxemic respiratory failure    - s/p paraesophageal hernia repair with significant cardiac history.     - patient with 2+ LE pitting edema and bilateral pleural effusion.  She is exudative by LDH and this may be due to stress of surgery.  I suspect the effusion is related to cardiac strain. BNP is ordered for today. Will diurese with spiranolactone '25mg'$  po and torsemide '20mg'$  po daily for now.  Her BP borderline normal and I expect this to drop a bit post diuresis.  I will change  metoprolol 12.5 to coreg 3.125 bid for now to reduce the hypotension from beta blockade while diuresing.   She has reduced K+ and I have ordered magnesium 2g repletion  and Kphos 78mol repletion   - patient is instructed to use IS at bedside we discussed in detail the correct way to use IS - due to absence of clinical and lab findings to suggest infection it is highly inlikely that fluid is parapneumonic or empyema.  Will continue to follow   Chest tube management    - forced expiratory maenuver with no air leak noted     - total appx 1350 output serosang colored fluid             Thank you for allowing me to participate in the care of this patient.   Patient/Family are satisfied with care plan and all questions have been answered.    Provider disclosure: Patient with at least one acute or chronic illness or injury that poses a threat to life or bodily function and is being managed actively during this encounter.  All of the below services have been performed independently by signing provider:  review of prior documentation from internal and or external health records.  Review of previous and current lab results.  Interview and comprehensive assessment during patient visit today. Review of current and previous chest radiographs/CT scans. Discussion of management and test interpretation with health care team and patient/family.   This document was prepared using Dragon voice recognition software and may include unintentional dictation errors.     FOttie Glazier M.D.  Division of Pulmonary & Critical Care Medicine

## 2021-09-18 NOTE — TOC Transition Note (Signed)
Transition of Care [facility]) - CM/SW Discharge Note   Patient Details  Name: Christina [name] MRN: [id number] Date of Birth: [birth year]  [facility] CM/SW Contact:  Beverly Sessions, RN Phone Number: 09/18/2021, 10:55 AM   Clinical Narrative:     Patient to dc today. [name] with [facility] has been notified.  Chest tube has been removed   Final next level of care: [place] Barriers to Discharge: Continued Medical Work up   Patient Goals and CMS Choice Patient states their goals for this hospitalization and ongoing recovery are:: Discharge home with HHPT/OT CMS Medicare.gov Compare Post Acute Care list provided to:: Patient Choice offered to / list presented to : Patient  Discharge Placement                       Discharge Plan and Services                DME Arranged: 3-N-1 DME Agency: [employer] Date DME Agency Contacted: 09/09/21 Time DME Agency Contacted: [phone] Representative spoke with at DME Agency: [place]: PT, OT [place] Agency: [place] ([place]) Date [place]: 09/09/21 Time [place]: 3545 Representative spoke with at [place]: [place] ([place]) Interventions     Readmission Risk Interventions    04/10/2021    4:48 PM  Readmission Risk Prevention Plan  Transportation Screening Complete  PCP or Specialist Appt within 5-7 Days Complete  Home Care Screening Patient refused  Medication Review (RN CM) Complete

## 2021-09-18 NOTE — Care Management Important Message (Signed)
Important Message  Patient Details  Name: Christina [name] MRN: [id number] Date of Birth: 11/08/1935   Medicare Important Message Given:  Yes     Dannette Barbara 09/18/2021, 11:06 AM

## 2021-09-18 NOTE — Progress Notes (Signed)
Mobility Specialist - Progress Note    09/18/21 1000  Mobility  Activity Ambulated with assistance in hallway;Stood at bedside;Dangled on edge of bed  Level of Assistance Standby assist, set-up cues, supervision of patient - no hands on  Assistive Device Front wheel walker  Distance Ambulated (ft) 320 ft  Activity Response Tolerated well  $Mobility charge 1 Mobility    Pt supine upon arrival using RA. Completes bed mobility with MinA (HHA). STS and ambulates with supervision -- 2 standing rest breaks with O2 91%-95%. Pt returns to bathroom and is left on toilet with RN notified. Pt educated on how to use call bell when finished.  Merrily Brittle Mobility Specialist 09/18/21, 10:33 AM

## 2021-09-18 NOTE — Progress Notes (Deleted)
[facility] SURGICAL DISCHARGE SUMMARY  Patient ID: Christina [name] MRN: [id number] DOB/AGE: 84-Jul-1937 84 y.o.  Admit date: 09/05/2021 Discharge date: 09/18/2021  Discharge Diagnoses Patient Active Problem List   Diagnosis Date Noted   Thrombocytopenia ([place]) 09/09/2021   Fever 09/07/2021   Acute respiratory failure with hypoxia (HCC)    S/P repair of paraesophageal hernia 09/05/2021   Impaired fasting glucose 04/11/2021   Pneumonia 04/09/2021   PAF (paroxysmal atrial fibrillation) ([place])    Primary hypertension 11/24/2020   Need for influenza vaccination 11/03/2020   Hiatal hernia with gastroesophageal reflux disease without esophagitis 11/03/2020   Atherosclerosis of both carotid arteries 03/18/2020   Memory loss 03/18/2020   Hand arthritis 03/18/2020   Neck pain 03/18/2020   Aortic atherosclerosis ([place]) 10/26/2016   Claudication ([place]) 10/26/2016   COPD (chronic obstructive pulmonary disease) ([place]) 10/22/2016   OSA on CPAP 09/16/2015   Abnormal chest x-ray 01/25/2015   Atrophic vaginitis 01/25/2015   Coronary artery disease 01/25/2015   Stage 3a chronic kidney disease ([place]) 01/25/2015   Bloodgood disease 01/25/2015   Blood glucose elevated 01/25/2015   Hyperlipidemia 01/25/2015   Cannot sleep 01/25/2015   Hypertension 11/02/2014   Acid reflux 11/02/2014   Arthritis 10/19/2014   Adenomatous colon polyp 07/27/2013   Gastric catarrh 07/27/2013   Hiatal hernia 07/27/2013   Barrett esophagus 07/07/2013   Osteopenia 12/13/2009   PAD (peripheral artery disease) ([place]) 09/24/2007   DD (diverticular disease) 12/19/2005    Consultants  Medicine Interventional Radiology Pulmonology   Procedures 09/05/2021:  Robotic assisted laparoscopic paraesophageal repair; Nissen fundoplication  HPI: Christina [name] is a 86 y.o. female with history of hiatal hernia with chronic symptoms who presents to [facility] on 08/01 for scheduled repair.   Hospital Course:  Informed consent was obtained and documented, and patient underwent uneventful robotic assisted laparoscopic paraesophageal hernia repair with Nissen fundoplication (Dr Dahlia Byes, [id number]/03/7739).  Post-operatively, patient was doing well on POD1. UGI was obtained which was reassuring. On POD2, she did develop low grade fever to 100.70F. She had medicine consultation and underwent extensive work up including CT Chest, which was concerning for right pleural effusion but also potentially showed esophageal leak. Interventional radiology was consulted and agreed to place chest tube (08/03). She underwent repeat CT Chest (Esophagram) which did not show esophageal leak. She was kept NPO as a precaution. She underwent repeat UGI on 08/07 which was negative and diet was restarted. Chest tube output was trended. Chylothorax was ruled out. She was found to have exudative pleural effusion vis Light's Criteria. Pulmonology was consulted on 08/11. On 08/14, her CXR remained reassuring and output was under <200 ccs. This was removed. The remainder of patient's hospital course was essentially unremarkable, and discharge planning was initiated accordingly with patient safely able to be discharged home with appropriate discharge instructions, pain control, and outpatient follow-up after all of her and her family's questions were answered to their expressed satisfaction.   Discharge Condition: Good    Physical Examination:  Constitutional: alert, cooperative and no distress  Respiratory: breathing non-labored at rest Chest: Chest tube removed Cardiovascular: regular rate and sinus rhythm  Gastrointestinal: Soft, incisional soreness, non-distended, no rebound/guarding. Integumentary: Laparoscopic incisions are CDI with dermabond, no erythema or drainage, some ecchymosis  MSK: No peripheral edema on examination    Allergies as of 09/18/2021       Reactions   Baclofen Other (See Comments)   Codeine Nausea Only   Plavix  [clopidogrel]    brusing all  over   Sulfa Antibiotics Nausea Only   Latex Rash        Medication List     STOP taking these medications    pantoprazole 40 MG tablet Commonly known as: PROTONIX       TAKE these medications    acetaminophen 500 MG tablet Commonly known as: TYLENOL Take 500 mg by mouth every 8 (eight) hours as needed for mild pain or moderate pain.   albuterol 108 (90 Base) MCG/ACT inhaler Commonly known as: VENTOLIN HFA Inhale 2 puffs into the lungs every 6 (six) hours as needed for wheezing or shortness of breath.   apixaban 2.5 MG Tabs tablet Commonly known as: ELIQUIS Take by mouth 2 (two) times daily.   azelastine 0.1 % nasal spray Commonly known as: ASTELIN Place 1 spray into both nostrils daily as needed.   CALCIUM 600+D3 PO Take 1 tablet by mouth daily.   carboxymethylcellulose 0.5 % Soln Commonly known as: REFRESH PLUS 1 drop 3 (three) times daily as needed.   CoQ-10 100 MG capsule Take 1 capsule (100 mg total) by mouth daily.   docusate sodium 100 MG capsule Commonly known as: COLACE Take 200 mg by mouth at bedtime.   hydrochlorothiazide 12.5 MG tablet Commonly known as: HYDRODIURIL Take 12.5 mg by mouth daily.   Metoprolol Tartrate 75 MG Tabs Take 75 mg by mouth 2 (two) times daily.   MULTI-VITAMIN DAILY PO Take 1 tablet by mouth daily.   oxyCODONE 5 MG immediate release tablet Commonly known as: Oxy IR/ROXICODONE Take 1 tablet (5 mg total) by mouth every 6 (six) hours as needed for severe pain or breakthrough pain.   rosuvastatin 20 MG tablet Commonly known as: Crestor Take 1 tablet (20 mg total) by mouth every evening.   Stiolto Respimat 2.5-2.5 MCG/ACT Aers Generic drug: Tiotropium Bromide-Olodaterol Inhale 2 puffs into the lungs in the morning and at bedtime.   Symbicort 160-4.5 MCG/ACT inhaler Generic drug: budesonide-formoterol 1 puff 2 (two) times daily.   tobramycin 0.3 % ophthalmic solution Commonly known  as: TOBREX   vitamin C 1000 MG tablet Take 1,000 mg by mouth daily.               Durable Medical Equipment  (From admission, onward)           Start     Ordered   09/09/21 1437  For home use only DME 3 n 1  Once        09/09/21 1437              Follow-up Information     Pabon, [place] F, MD. Schedule an appointment as soon as possible for a visit on 09/25/2021.   Specialty: General Surgery Why: Go to appointment on 08/21 at 10:45 AM....s/p laparoscopic paraesophageal hernia repair Contact information: [address] [place] [address] [phone]                  Time spent on discharge management including discussion of hospital course, clinical condition, outpatient instructions, prescriptions, and follow up with the patient and members of the medical team: >30 minutes  -- Edison Simon , PA-C  [facility]  09/18/2021, 9:25 AM [phone] M-F: 7am - 4pm

## 2021-09-18 NOTE — Progress Notes (Signed)
Chronic Care management APPOINTMENT REMINDER   Christina [name] was reminded to have all medications, supplements and any blood glucose and blood pressure readings available for review with Junius Argyle, Pharm. D, at her telephone visit on 09/19/2021 at 8:30 am.  Patient states she would like to cancel her appointment, and would like me to call later this month to reschedule.Patient reports she not feeling up to talking to anyone as she is getting release from the hospital today.Notfied Clinical pharmacist.  [place] Clinical Pharmacist Assistant [phone]

## 2021-09-19 ENCOUNTER — Other Ambulatory Visit: Payer: Self-pay | Admitting: *Deleted

## 2021-09-19 ENCOUNTER — Telehealth: Payer: Medicare Other

## 2021-09-19 NOTE — Patient Outreach (Signed)
  Care Coordination [facility] Note Transition Care Management Follow-up Telephone Call Date of discharge and from where: 09/18/21 [facility] How have you been since you were released from the hospital? Patient states she is feeling weak but is trying to move around the best she can. Any questions or concerns? No  Items Reviewed: Did the pt receive and understand the discharge instructions provided? Yes  Medications obtained and verified? Yes  Other? No  Any new allergies since your discharge? No  Dietary orders reviewed? Yes Do you have support at home? Yes   Home Care and Equipment/Supplies: Were home health services ordered? yes If so, what is the name of the agency? [facility]  Has the agency set up a time to come to the patient's home? no Were any new equipment or medical supplies ordered?  Yes: 3-N-1 What is the name of the medical supply agency? [employer] Were you able to get the supplies/equipment? yes Do you have any questions related to the use of the equipment or supplies? No  Functional Questionnaire: (I = Independent and D = Dependent) ADLs: I  Bathing/Dressing- D  Meal Prep- D  Eating- I  Maintaining continence- I  Transferring/Ambulation- I  Managing Meds- I  Follow up appointments reviewed:  PCP Hospital f/u appt confirmed? No   Specialist Hospital f/u appt confirmed? Yes  Scheduled to see Dr. Dahlia Byes surgeon on 09/25/21 @ 1045. Are transportation arrangements needed? No  If their condition worsens, is the pt aware to call PCP or go to the Emergency Dept.? Yes Was the patient provided with contact information for the PCP's office or ED? Yes Was to pt encouraged to call back with questions or concerns? Yes  SDOH assessments and interventions completed:   Yes  Care Coordination Interventions Activated:  No   Care Coordination Interventions:   N/A     Encounter Outcome:  Pt. Visit Completed    Emelia Loron RN, BSN [place] [phone] [email or web address]

## 2021-09-20 ENCOUNTER — Encounter: Payer: Medicare Other | Admitting: Surgery

## 2021-09-21 DIAGNOSIS — H353221 Exudative age-related macular degeneration, left eye, with active choroidal neovascularization: Secondary | ICD-10-CM | POA: Diagnosis not present

## 2021-09-21 DIAGNOSIS — H35432 Paving stone degeneration of retina, left eye: Secondary | ICD-10-CM | POA: Diagnosis not present

## 2021-09-21 DIAGNOSIS — H43813 Vitreous degeneration, bilateral: Secondary | ICD-10-CM | POA: Diagnosis not present

## 2021-09-21 DIAGNOSIS — H353213 Exudative age-related macular degeneration, right eye, with inactive scar: Secondary | ICD-10-CM | POA: Diagnosis not present

## 2021-09-25 ENCOUNTER — Encounter: Payer: Self-pay | Admitting: Surgery

## 2021-09-25 ENCOUNTER — Other Ambulatory Visit
Admission: RE | Admit: 2021-09-25 | Discharge: 2021-09-25 | Disposition: A | Discharge status: Home / Self Care | Payer: Medicare Other | Source: Ambulatory Visit | Attending: Surgery | Admitting: Surgery

## 2021-09-25 ENCOUNTER — Telehealth: Payer: Self-pay

## 2021-09-25 ENCOUNTER — Ambulatory Visit (INDEPENDENT_AMBULATORY_CARE_PROVIDER_SITE_OTHER): Payer: Medicare Other | Admitting: Surgery

## 2021-09-25 VITALS — BP 110/67 | HR 94 | Temp 98.1°F | Ht 66.0 in | Wt 169.0 lb

## 2021-09-25 DIAGNOSIS — Z9889 Other specified postprocedural states: Secondary | ICD-10-CM | POA: Insufficient documentation

## 2021-09-25 DIAGNOSIS — R627 Adult failure to thrive: Secondary | ICD-10-CM | POA: Diagnosis not present

## 2021-09-25 DIAGNOSIS — R7989 Other specified abnormal findings of blood chemistry: Secondary | ICD-10-CM | POA: Diagnosis not present

## 2021-09-25 DIAGNOSIS — K6289 Other specified diseases of anus and rectum: Secondary | ICD-10-CM

## 2021-09-25 LAB — CBC WITH DIFFERENTIAL/PLATELET
Abs Immature Granulocytes: 0.04 10*3/uL (ref 0.00–0.07)
Basophils Absolute: 0 10*3/uL (ref 0.0–0.1)
Basophils Relative: 1 %
Eosinophils Absolute: 0 10*3/uL (ref 0.0–0.5)
Eosinophils Relative: 1 %
HCT: 31.9 % — ABNORMAL LOW (ref 36.0–46.0)
Hemoglobin: 10 g/dL — ABNORMAL LOW (ref 12.0–15.0)
Immature Granulocytes: 1 %
Lymphocytes Relative: 11 %
Lymphs Abs: 0.7 10*3/uL (ref 0.7–4.0)
MCH: 31.6 pg (ref 26.0–34.0)
MCHC: 31.3 g/dL (ref 30.0–36.0)
MCV: 100.9 fL — ABNORMAL HIGH (ref 80.0–100.0)
Monocytes Absolute: 0.7 10*3/uL (ref 0.1–1.0)
Monocytes Relative: 10 %
Neutro Abs: 5 10*3/uL (ref 1.7–7.7)
Neutrophils Relative %: 76 %
Platelets: 549 10*3/uL — ABNORMAL HIGH (ref 150–400)
RBC: 3.16 MIL/uL — ABNORMAL LOW (ref 3.87–5.11)
RDW: 13.8 % (ref 11.5–15.5)
WBC: 6.5 10*3/uL (ref 4.0–10.5)
nRBC: 0 % (ref 0.0–0.2)

## 2021-09-25 LAB — COMPREHENSIVE METABOLIC PANEL
ALT: 17 U/L (ref 0–44)
AST: 27 U/L (ref 15–41)
Albumin: 2.9 g/dL — ABNORMAL LOW (ref 3.5–5.0)
Alkaline Phosphatase: 144 U/L — ABNORMAL HIGH (ref 38–126)
Anion gap: 10 (ref 5–15)
BUN: 21 mg/dL (ref 8–23)
CO2: 30 mmol/L (ref 22–32)
Calcium: 9.3 mg/dL (ref 8.9–10.3)
Chloride: 92 mmol/L — ABNORMAL LOW (ref 98–111)
Creatinine, Ser: 1.03 mg/dL — ABNORMAL HIGH (ref 0.44–1.00)
GFR, Estimated: 54 mL/min — ABNORMAL LOW (ref >60)
Glucose, Bld: 142 mg/dL — ABNORMAL HIGH (ref 70–99)
Potassium: 3.2 mmol/L — ABNORMAL LOW (ref 3.5–5.1)
Sodium: 132 mmol/L — ABNORMAL LOW (ref 135–145)
Total Bilirubin: 1 mg/dL (ref 0.3–1.2)
Total Protein: 6.4 g/dL — ABNORMAL LOW (ref 6.5–8.1)

## 2021-09-25 LAB — PROTIME-INR
INR: 1.1 (ref 0.8–1.2)
Prothrombin Time: 14.4 seconds (ref 11.4–15.2)

## 2021-09-25 LAB — MAGNESIUM: Magnesium: 2.3 mg/dL (ref 1.7–2.4)

## 2021-09-25 LAB — PHOSPHORUS: Phosphorus: 3.2 mg/dL (ref 2.5–4.6)

## 2021-09-25 NOTE — Telephone Encounter (Signed)
Patient notified of labs. She was instructed to drink Pedialyte and to push fluids. She stated she had another small bowel movement.

## 2021-09-25 NOTE — Progress Notes (Signed)
Outpatient Surgical Follow Up  09/25/2021  AVELYN TOUCH is an 86 y.o. female.   Chief Complaint  Patient presents with   Routine Post Op    HPI: Christina [name] is 3 weeks out after giant paraesophageal hernia repair.  She did have persistent right pleural effusion that required chest tube.  She did have significant respiratory symptoms requiring aggressive diureses.  We perform extensive work-up to rule out esophageal perforation.  We did to barium swallows as well as a CT esophagram showing no evidence of leaks.  Pulmonary also saw her and optimized her diuretic therapy. Today she comes in with severe anorectal pain.  She denies any abdominal pain.  She is taking p.o. but apparently not enough.  No fevers no chills.  She is only about 1 to pound more than her baseline weight.  No evidence of fluid overload  Past Medical History:  Diagnosis Date   Adenomatous colon polyp    Anemia    Aortic atherosclerosis (HCC)    Arthritis    Barrett's esophagus    Bilateral carotid artery disease ([place])    a.) carotid doppler 07/18/2020: 2-97% RICA, 98-92% LICA; b.) carotid doppler 11/94/1740: 81-44% LICA, no sig RICA; c.) carotid doppler 01/23/2021: 8-18% RICA, 56-31% LICA; d.) carotid doppler 49/70/2637: 85-88% LICA, near norm RICA   CKD (chronic kidney disease), stage III (HCC)    COPD (chronic obstructive pulmonary disease) ([place])    Coronary artery disease 04/21/2004   a.) LHC 04/21/2004 [facility]): EF 66%, 50% dLCx, 75% pLCx, 75% oRCA, 50% pRCA --> transferred to [facility] for CVTS consult; b.) 3v CABG   Dyspnea on exertion    GERD (gastroesophageal reflux disease)    Hiatal hernia    Hyperlipidemia    Hypertension    Long term current use of anticoagulant    a.) reduced dose apixaban   Macular degeneration    OSA on CPAP    PAD (peripheral artery disease) ([place]) 12/10/2016   a.) PTA of BILATERAL common iliac arteries --> 7 x 26 mm stents placed   PAF (paroxysmal atrial fibrillation) (HCC)     a.) CHA2DS2-VASc = 6 (age x 2, sex, HTN, vascular disease, T2DM);  b.) rate/rhythm maintained on oral metoprolol tartrate; chronically anticoagulated using dose reduced apixaban   Peripheral edema    PSVT (paroxysmal supraventricular tachycardia) ([place])    S/P CABG x 3 04/2004   Schatzki's ring    Scoliosis of thoracolumbar spine    Valvular heart disease    a.) TTE 04/20/2004: EF >55%, mild MR; b.)  TTE 04/10/2021: EF 50-55%, LVH, reduced RV SF, RVE, moderate BAE, mild-mod MR, severe TR.    Past Surgical History:  Procedure Laterality Date   ABDOMINAL HYSTERECTOMY  1980   due to dysfunctional uterine bleeding   APPENDECTOMY  1980   BREAST SURGERY Left 2000   biopsy   CARDIAC CATHETERIZATION     COLONOSCOPY     CORONARY ARTERY BYPASS GRAFT  2006   ESOPHAGOGASTRODUODENOSCOPY (EGD) WITH PROPOFOL N/A 06/08/2021   Procedure: ESOPHAGOGASTRODUODENOSCOPY (EGD) WITH PROPOFOL;  Surgeon: [name], MD;  Location: [facility] ENDOSCOPY;  Service: Gastroenterology;  Laterality: N/A;   HEMORRHOID SURGERY     INSERTION OF MESH  09/05/2021   Procedure: INSERTION OF MESH;  Surgeon: Jules Husbands, MD;  Location: [facility] ORS;  Service: General;;   LOWER EXTREMITY ANGIOGRAPHY Right 02/25/2017   Procedure: LOWER EXTREMITY ANGIOGRAPHY;  Surgeon: Algernon Huxley, MD;  Location: [place] CV LAB;  Service: Cardiovascular;  Laterality: Right;   LOWER EXTREMITY INTERVENTION  02/25/2017   Procedure: LOWER EXTREMITY INTERVENTION;  Surgeon: Algernon Huxley, MD;  Location: [place] CV LAB;  Service: Cardiovascular;;   LUNG BIOPSY  1999   Negative   TONSILLECTOMY     XI ROBOTIC ASSISTED PARAESOPHAGEAL HERNIA REPAIR N/A 09/05/2021   Procedure: XI ROBOTIC ASSISTED PARAESOPHAGEAL HERNIA REPAIR, RNFA to assist;  Surgeon: Jules Husbands, MD;  Location: [facility] ORS;  Service: General;  Laterality: N/A;    Family History  Problem Relation Age of Onset   Hypertension Mother    Hyperlipidemia Mother    Alzheimer's disease Mother     CAD Mother    Heart attack Father    Lung disease Sister    Heart disease Sister    Diabetes Paternal Grandfather        Type 2   Hearing loss Son     Social History:  reports that she quit smoking about 17 years ago. Her smoking use included cigarettes. She has a 2.50 pack-year smoking history. She has been exposed to tobacco smoke. She has never used smokeless tobacco. She reports that she does not drink alcohol and does not use drugs.  Allergies:  Allergies  Allergen Reactions   Baclofen Other (See Comments)   Codeine Nausea Only   Plavix [Clopidogrel]     brusing all over   Sulfa Antibiotics Nausea Only   Latex Rash    Medications reviewed.    ROS Full ROS performed and is otherwise negative other than what is stated in HPI   BP 110/67   Pulse 94   Temp 98.1 F (36.7 C)   Ht '5\' 6"'$  (1.676 m)   Wt 169 lb (76.7 kg)   SpO2 100%   BMI 27.28 kg/m   Physical Exam Dilatated. NAd alert Chest: CTA bilaterally, evidence of subcu air. A fib , s1,s2. No murmurs Abd: soft .  Patient is healing well without infection.  No peritonitis minimal appropriate incisional tenderness Ext: Does have a small hematoma on the right foot on the lateral aspect. Rectal: There is some stool.  There is some perianal erythema consistent with some irritation.  There is no evidence of necrotizing infection or abscess. There is no evidence of hemorrhoids  Assessment/Plan: 86 year old female with failure to thrive and severe anorectal pain.  I do not have any reasonable explanation for the severe pain in the anorectal area other than some irritation.  Does have an appointment with cardiology tomorrow I will go ahead and order some labs to see if there is any electrolyte abnormalities or any other alarming test.  Does not look septic she is not peritonitic does not need emergent surgical intervention.  We will see her 1 week from now and keep a close eye   Caroleen Hamman, MD [place] Surgeon

## 2021-09-25 NOTE — Patient Instructions (Addendum)
Please apply Desitin to the area to avoid chaffing. Take Miralax daily. Be sure to drink plenty of fluids.   Go directly to the lab to have blood drawn. We will call you with the results.   Please see your follow up appointment listed below.

## 2021-09-26 DIAGNOSIS — J449 Chronic obstructive pulmonary disease, unspecified: Secondary | ICD-10-CM | POA: Diagnosis not present

## 2021-09-26 DIAGNOSIS — N1831 Chronic kidney disease, stage 3a: Secondary | ICD-10-CM | POA: Diagnosis not present

## 2021-09-26 DIAGNOSIS — I739 Peripheral vascular disease, unspecified: Secondary | ICD-10-CM | POA: Diagnosis not present

## 2021-09-26 DIAGNOSIS — E782 Mixed hyperlipidemia: Secondary | ICD-10-CM | POA: Diagnosis not present

## 2021-09-26 DIAGNOSIS — Z951 Presence of aortocoronary bypass graft: Secondary | ICD-10-CM | POA: Diagnosis not present

## 2021-09-26 DIAGNOSIS — I071 Rheumatic tricuspid insufficiency: Secondary | ICD-10-CM | POA: Diagnosis not present

## 2021-09-26 DIAGNOSIS — I480 Paroxysmal atrial fibrillation: Secondary | ICD-10-CM | POA: Diagnosis not present

## 2021-09-26 DIAGNOSIS — G4733 Obstructive sleep apnea (adult) (pediatric): Secondary | ICD-10-CM | POA: Diagnosis not present

## 2021-09-26 DIAGNOSIS — I25118 Atherosclerotic heart disease of native coronary artery with other forms of angina pectoris: Secondary | ICD-10-CM | POA: Diagnosis not present

## 2021-09-26 DIAGNOSIS — I10 Essential (primary) hypertension: Secondary | ICD-10-CM | POA: Diagnosis not present

## 2021-10-02 ENCOUNTER — Ambulatory Visit (INDEPENDENT_AMBULATORY_CARE_PROVIDER_SITE_OTHER): Payer: Medicare Other | Admitting: Surgery

## 2021-10-02 ENCOUNTER — Other Ambulatory Visit: Payer: Self-pay

## 2021-10-02 ENCOUNTER — Encounter: Payer: Self-pay | Admitting: Surgery

## 2021-10-02 VITALS — BP 117/70 | HR 101 | Temp 98.1°F | Ht 66.0 in | Wt 153.6 lb

## 2021-10-02 DIAGNOSIS — K449 Diaphragmatic hernia without obstruction or gangrene: Secondary | ICD-10-CM

## 2021-10-02 DIAGNOSIS — J90 Pleural effusion, not elsewhere classified: Secondary | ICD-10-CM | POA: Diagnosis not present

## 2021-10-02 DIAGNOSIS — Z09 Encounter for follow-up examination after completed treatment for conditions other than malignant neoplasm: Secondary | ICD-10-CM

## 2021-10-02 DIAGNOSIS — J439 Emphysema, unspecified: Secondary | ICD-10-CM | POA: Diagnosis not present

## 2021-10-02 DIAGNOSIS — J449 Chronic obstructive pulmonary disease, unspecified: Secondary | ICD-10-CM | POA: Diagnosis not present

## 2021-10-02 DIAGNOSIS — R911 Solitary pulmonary nodule: Secondary | ICD-10-CM | POA: Diagnosis not present

## 2021-10-02 NOTE — Patient Instructions (Addendum)
No bread. No carbonated beverages.   Please call with any questions or concerns. Please see your follow up appointments listed below.

## 2021-10-03 DIAGNOSIS — J449 Chronic obstructive pulmonary disease, unspecified: Secondary | ICD-10-CM | POA: Diagnosis not present

## 2021-10-04 NOTE — Progress Notes (Signed)
Christina [name] Is 4 weeks out after giant paraesophageal hernia repair.  She did have persistent right pleural effusion that required chest tube.  She did have significant respiratory symptoms requiring aggressive diureses.  We perform extensive work-up to rule out esophageal perforation.  We did to barium swallows as well as a CT esophagram showing no evidence of leaks.  Pulmonary also saw her and optimized her diuretic therapy. Her anorectal pain has subsided..  She denies any abdominal pain.  She is taking p.o.and continues to improve.  No fevers no chills.     Physical Exam . NAd alert Chest: CTA bilaterally, evidence of subcu air. A fib , s1,s2. No murmurs Abd: soft .  Patient is healing well without infection.  No peritonitis minimal appropriate incisional tenderness  A/P Improving slowly F/u cards and pulm RTC 4 weeks or so

## 2021-10-05 ENCOUNTER — Telehealth: Payer: Self-pay

## 2021-10-05 NOTE — Progress Notes (Signed)
I reach out to patient to reschedule her appointment that was cancel earlier this month as requested from patient.   Patient states she prefers not to reschedule her appointment at this time as she is recovering from surgery.Patient reports she has a lot of appointments coming up , and would like to reach out to me when she is feeling better to reschedule.  [place] Pharmacist Assistant [phone]

## 2021-10-24 ENCOUNTER — Other Ambulatory Visit: Payer: Self-pay

## 2021-10-24 ENCOUNTER — Encounter: Payer: Self-pay | Admitting: Physician Assistant

## 2021-10-24 ENCOUNTER — Ambulatory Visit (INDEPENDENT_AMBULATORY_CARE_PROVIDER_SITE_OTHER): Payer: Medicare Other | Admitting: Physician Assistant

## 2021-10-24 VITALS — BP 147/75 | HR 94 | Temp 98.4°F | Ht 66.0 in | Wt 155.0 lb

## 2021-10-24 DIAGNOSIS — Z9889 Other specified postprocedural states: Secondary | ICD-10-CM

## 2021-10-24 DIAGNOSIS — Z09 Encounter for follow-up examination after completed treatment for conditions other than malignant neoplasm: Secondary | ICD-10-CM

## 2021-10-24 DIAGNOSIS — K449 Diaphragmatic hernia without obstruction or gangrene: Secondary | ICD-10-CM

## 2021-10-24 NOTE — Progress Notes (Signed)
[facility] POST-OP OFFICE VISIT  10/24/2021  HPI: Christina [name] is a 86 y.o. female ~7 weeks s/p robotic assisted laparoscopic paraesophageal hernia repair with Nissen fundoplication which was complicated by right pleural effusion s/p chest tube placement (08/03)  From an abdominal perspective, she is doing very well. No abdominal pain, fever, chills, nausea, emesis. She has occasional very mild reflux. She continues to follow her Nissen diet recommendations but she is wanting to liberalize her diet. No other complaints  From a pulmonary perspective, she feels her breathing is doing better. She is seeing Dr Lanney Gins and they are very pleased. No chest pain. She does have peripheral edema but states she was recently started on diuretic.   Vital signs: BP (!) 147/75   Pulse 94   Temp 98.4 F (36.9 C) (Oral)   Ht '5\' 6"'$  (1.676 m)   Wt 155 lb (70.3 kg)   SpO2 97%   BMI 25.02 kg/m    Physical Exam: Constitutional: Well appearing female, NAD Pulmonary; Lungs are clear on the left, slightly diminished breath sounds on the lower right, no rales, rhonchi, or wheezing Cardiac: HRRR, no m/r/g Abdomen: Soft, non-tender, non-distended, no rebound/guarding Skin: Laparoscopic incisions are well healed  Assessment/Plan: This is a 86 y.o. female ~7 weeks s/p robotic assisted laparoscopic paraesophageal hernia repair with Nissen fundoplication which was complicated by right pleural effusion s/p chest tube placement (08/03)   - At this point, I feel she is okay to begin to liberalize her diet gradually.   - She has otherwise completed all post-operative restrictions  - Follow up with pulmonology as scheduled  - We will see her again at the end of November; She, and her husband, understand to call with questions/concerns  -- Edison Simon, PA-C [facility] 10/24/2021, 2:06 PM M-F: 7am - 4pm

## 2021-10-24 NOTE — Patient Instructions (Signed)
Please call the office if you have questions or concerns.

## 2021-11-02 ENCOUNTER — Ambulatory Visit: Payer: Self-pay

## 2021-11-02 NOTE — Telephone Encounter (Signed)
3rd attempt, pt called, LVMTCB. Will route to practice for review.   Summary: Vaccine questions for nurse    Pt has medical questions regarding her upcoming vaccines that she needs please advise   Best contact: [phone]

## 2021-11-02 NOTE — Telephone Encounter (Signed)
Patient called, left VM to return the call to the office to discuss vaccine concerns with a nurse.  Summary: Vaccine questions for nurse   Pt has medical questions regarding her upcoming vaccines that she needs please advise   Best contact: [phone]

## 2021-11-02 NOTE — Telephone Encounter (Signed)
2nd attempt, pt called, LVMTCB to discuss

## 2021-11-06 NOTE — Telephone Encounter (Signed)
Patient advised of recommended vaccine for her. COVID and shingles vaccine. Patient advised to get vaccines at her pharmacy.

## 2021-11-06 NOTE — Telephone Encounter (Signed)
Lmtcb.

## 2021-11-08 ENCOUNTER — Ambulatory Visit (INDEPENDENT_AMBULATORY_CARE_PROVIDER_SITE_OTHER): Payer: Medicare Other

## 2021-11-08 DIAGNOSIS — Z23 Encounter for immunization: Secondary | ICD-10-CM | POA: Diagnosis not present

## 2021-11-13 ENCOUNTER — Telehealth: Payer: Self-pay

## 2021-11-13 NOTE — Progress Notes (Signed)
Chronic Care Management Pharmacy Assistant   Name: Christina [name]  MRN: [id number] DOB: 07-01-1935  Reason for Encounter:Hypertension Disease State Call.  Recent office visits:  None ID  Recent consult visits:  10/24/2021 Edison Simon PA-C (General Surgery) No medication Changes noted 10/02/2021 Dr.Pabon MD (General Surgery)No medication Changes noted 10/02/2021 Dr. Lanney Gins MD (Pulmonology) stop Trelegy, stop Stiolto, start ipratropium-albuteroL (DUO-NEB) nebulizer solution 16/60/6301 Delicia White NP (Cardiology) No medication Changes noted, return in 3 months 09/25/2021 Dr.Pabon MD (General Surgery) Start  Miralax daily 08/17/2021 Dr. [name] MD (Vascular surgery) No Medication Changes noted 08/16/2021 Dr. Dahlia Byes MD (General Surgery) No Medication Changes noted  Hospital visits:  Medication Reconciliation was completed by comparing discharge summary, patient's EMR and Pharmacy list, and upon discussion with patient.  Admitted to the hospital on 09/05/2021 due to Recurrent right pleural effusion. Discharge date was 09/18/2021. Discharged from [place]?Medications Started at [facility] Discharge:?? -started None ID  Medication Changes at Hospital Discharge: -Changed None ID  Medications Discontinued at Hospital Discharge: -Stopped pantoprazole  Medications that remain the same after Hospital Discharge:??  -All other medications will remain the same.    Medications: Outpatient Encounter Medications as of 11/13/2021  Medication Sig   acetaminophen (TYLENOL) 500 MG tablet Take 500 mg by mouth every 8 (eight) hours as needed for mild pain or moderate pain.   albuterol (VENTOLIN HFA) 108 (90 Base) MCG/ACT inhaler Inhale 2 puffs into the lungs every 6 (six) hours as needed for wheezing or shortness of breath.   apixaban (ELIQUIS) 2.5 MG TABS tablet Take by mouth 2 (two) times daily.   Ascorbic Acid (VITAMIN C) 1000 MG tablet Take 1,000 mg by mouth daily.    azelastine (ASTELIN) 0.1 % nasal spray Place 1 spray into both nostrils daily as needed.   Calcium Carb-Cholecalciferol (CALCIUM 600+D3 PO) Take 1 tablet by mouth daily.   carboxymethylcellulose (REFRESH PLUS) 0.5 % SOLN 1 drop 3 (three) times daily as needed.   Coenzyme Q10 (COQ-10) 100 MG capsule Take 1 capsule (100 mg total) by mouth daily.   docusate sodium (COLACE) 100 MG capsule Take 200 mg by mouth at bedtime.   furosemide (LASIX) 20 MG tablet Take 20 mg by mouth daily.   hydrochlorothiazide (HYDRODIURIL) 12.5 MG tablet Take 12.5 mg by mouth daily.   Metoprolol Tartrate 75 MG TABS Take 75 mg by mouth 2 (two) times daily.   Multiple Vitamin (MULTI-VITAMIN DAILY PO) Take 1 tablet by mouth daily.   rosuvastatin (CRESTOR) 20 MG tablet Take 1 tablet (20 mg total) by mouth every evening.   SYMBICORT 160-4.5 MCG/ACT inhaler 1 puff 2 (two) times daily.   Tiotropium Bromide-Olodaterol (STIOLTO RESPIMAT) 2.5-2.5 MCG/ACT AERS Inhale 2 puffs into the lungs in the morning and at bedtime.   tobramycin (TOBREX) 0.3 % ophthalmic solution    No facility-administered encounter medications on file as of 11/13/2021.    Care Gaps: Shingrix Vaccine Dexa Scan COVID-19 Vaccine   Star Rating Drugs: Rosuvastatin 20 mg last filled 10/08/2021 90 day supply at [facility].   Medication Fill Gaps: None ID  Reviewed chart prior to disease state call. Spoke with patient regarding BP  Recent Office Vitals: BP Readings from Last 3 Encounters:  10/24/21 (!) 147/75  10/02/21 117/70  09/25/21 110/67   Pulse Readings from Last 3 Encounters:  10/24/21 94  10/02/21 (!) 101  09/25/21 94    Wt Readings from Last 3 Encounters:  10/24/21 155 lb (70.3 kg)  10/02/21  153 lb 9.6 oz (69.7 kg)  09/25/21 169 lb (76.7 kg)     Kidney Function Lab Results  Component Value Date/Time   CREATININE 1.03 (H) 09/25/2021 11:58 AM   CREATININE 0.99 09/17/2021 04:55 AM   GFRNONAA 54 (L) 09/25/2021 11:58 AM   GFRAA  47 (L) 03/18/2020 10:59 AM       Latest Ref Rng & Units 09/25/2021   11:58 AM 09/17/2021    4:55 AM 09/15/2021    4:56 AM  BMP  Glucose 70 - 99 mg/dL 142  121  112   BUN 8 - 23 mg/dL '21  18  14   '$ Creatinine 0.44 - 1.00 mg/dL 1.03  0.99  0.95   Sodium 135 - 145 mmol/L 132  133  138   Potassium 3.5 - 5.1 mmol/L 3.2  5.2  3.9   Chloride 98 - 111 mmol/L 92  100  108   CO2 22 - 32 mmol/L '30  28  25   '$ Calcium 8.9 - 10.3 mg/dL 9.3  8.2  8.0     Current antihypertensive regimen:  HCTZ 12.5 mg daily Metoprolol tartrate 75 mg twice daily  How often are you checking your Blood Pressure? 3-5x per week  Current home BP readings:  Paitent states her blood pressure is "pretty good", but does not have any readings.Patient reports she had sereval follow ups , and all her blood pressure has been "normal".  What recent interventions/DTPs have been made by any provider to improve Blood Pressure control since last CPP Visit: None ID  Any recent hospitalizations or ED visits since last visit with CPP? Yes  What diet changes have been made to improve Blood Pressure Control?  Patient denies any changes.  What exercise is being done to improve your Blood Pressure Control?  Patient denies any changes.  Adherence Review: Is the patient currently on ACE/ARB medication? No Does the patient have >5 day gap between last estimated fill dates? No  Patient reports she switch pulmonologist, and was started on Duo-NEB which seems to be very helpful.  Telephone follow up appointment with Care management team member scheduled for : 01/09/2022  at 11:00 am.  [place] Pharmacist Assistant [phone]

## 2021-11-22 ENCOUNTER — Other Ambulatory Visit (INDEPENDENT_AMBULATORY_CARE_PROVIDER_SITE_OTHER): Payer: Self-pay | Admitting: Nurse Practitioner

## 2021-11-22 DIAGNOSIS — M7989 Other specified soft tissue disorders: Secondary | ICD-10-CM

## 2021-11-23 ENCOUNTER — Ambulatory Visit (INDEPENDENT_AMBULATORY_CARE_PROVIDER_SITE_OTHER): Payer: Medicare Other

## 2021-11-23 DIAGNOSIS — M7989 Other specified soft tissue disorders: Secondary | ICD-10-CM | POA: Diagnosis not present

## 2021-11-29 DIAGNOSIS — H43813 Vitreous degeneration, bilateral: Secondary | ICD-10-CM | POA: Diagnosis not present

## 2021-11-29 DIAGNOSIS — H353221 Exudative age-related macular degeneration, left eye, with active choroidal neovascularization: Secondary | ICD-10-CM | POA: Diagnosis not present

## 2021-11-29 DIAGNOSIS — H35432 Paving stone degeneration of retina, left eye: Secondary | ICD-10-CM | POA: Diagnosis not present

## 2021-11-29 DIAGNOSIS — H353213 Exudative age-related macular degeneration, right eye, with inactive scar: Secondary | ICD-10-CM | POA: Diagnosis not present

## 2021-12-05 ENCOUNTER — Encounter (INDEPENDENT_AMBULATORY_CARE_PROVIDER_SITE_OTHER): Payer: Self-pay | Admitting: Vascular Surgery

## 2021-12-05 ENCOUNTER — Ambulatory Visit (INDEPENDENT_AMBULATORY_CARE_PROVIDER_SITE_OTHER): Payer: Medicare Other | Admitting: Vascular Surgery

## 2021-12-05 VITALS — BP 129/74 | HR 73 | Resp 18 | Ht 65.0 in | Wt 159.8 lb

## 2021-12-05 DIAGNOSIS — I10 Essential (primary) hypertension: Secondary | ICD-10-CM | POA: Diagnosis not present

## 2021-12-05 DIAGNOSIS — E7800 Pure hypercholesterolemia, unspecified: Secondary | ICD-10-CM

## 2021-12-05 DIAGNOSIS — N1831 Chronic kidney disease, stage 3a: Secondary | ICD-10-CM | POA: Diagnosis not present

## 2021-12-05 DIAGNOSIS — M7989 Other specified soft tissue disorders: Secondary | ICD-10-CM | POA: Diagnosis not present

## 2021-12-05 NOTE — Assessment & Plan Note (Signed)
blood pressure control important in reducing the progression of atherosclerotic disease. On appropriate oral medications.  

## 2021-12-05 NOTE — Assessment & Plan Note (Signed)
lipid control important in reducing the progression of atherosclerotic disease. Continue statin therapy  

## 2021-12-05 NOTE — Progress Notes (Signed)
MRN : [id number]  Christina [name] is a 86 y.o. (1935/11/19) female who presents with chief complaint of No chief complaint on file. Marland Kitchen  History of Present Illness: Patient returns today in follow up prior to her scheduled visit due to worsening leg pain and swelling.  This has been a problem now for about a month.  She describes 2 to 3 months ago when she had surgery for her hiatal hernia, she had no significant pleural effusion requiring drainage and was in the hospital for 2 weeks.  She had a lot of volume issues at that time.  She is scheduled to see her cardiologist office next week.  Her leg swelling is gotten dramatically worse in the left is a little worse than the right.  Compression socks have been used but have been ineffective at keeping the swelling under control.  She also has a history of chronic kidney disease.  She says she has been making a relatively normal amount of urine. To evaluate her swelling, venous study was performed recently in our office.  No evidence of DVT or superficial thrombophlebitis was seen and on reflux evaluation marked volume overload was all that was noted.  Current Outpatient Medications  Medication Sig Dispense Refill   acetaminophen (TYLENOL) 500 MG tablet Take 500 mg by mouth every 8 (eight) hours as needed for mild pain or moderate pain.     albuterol (VENTOLIN HFA) 108 (90 Base) MCG/ACT inhaler Inhale 2 puffs into the lungs every 6 (six) hours as needed for wheezing or shortness of breath. 18 g 5   apixaban (ELIQUIS) 2.5 MG TABS tablet Take by mouth 2 (two) times daily.     Ascorbic Acid (VITAMIN C) 1000 MG tablet Take 1,000 mg by mouth daily.     azelastine (ASTELIN) 0.1 % nasal spray Place 1 spray into both nostrils daily as needed.     Calcium Carb-Cholecalciferol (CALCIUM 600+D3 PO) Take 1 tablet by mouth daily.     carboxymethylcellulose (REFRESH PLUS) 0.5 % SOLN 1 drop 3 (three) times daily as needed.     Coenzyme Q10 (COQ-10) 100 MG  capsule Take 1 capsule (100 mg total) by mouth daily. 30 capsule 1   docusate sodium (COLACE) 100 MG capsule Take 200 mg by mouth at bedtime.     furosemide (LASIX) 20 MG tablet Take 20 mg by mouth daily.     Metoprolol Tartrate 75 MG TABS Take 75 mg by mouth 2 (two) times daily. 180 tablet 1   Multiple Vitamin (MULTI-VITAMIN DAILY PO) Take 1 tablet by mouth daily.     rosuvastatin (CRESTOR) 20 MG tablet Take 1 tablet (20 mg total) by mouth every evening. 90 tablet 3   SYMBICORT 160-4.5 MCG/ACT inhaler 1 puff 2 (two) times daily.     Tiotropium Bromide-Olodaterol (STIOLTO RESPIMAT) 2.5-2.5 MCG/ACT AERS Inhale 2 puffs into the lungs in the morning and at bedtime. 1 each 3   tobramycin (TOBREX) 0.3 % ophthalmic solution   5   hydrochlorothiazide (HYDRODIURIL) 12.5 MG tablet Take 12.5 mg by mouth daily.     No current facility-administered medications for this visit.    Past Medical History:  Diagnosis Date   Adenomatous colon polyp    Anemia    Aortic atherosclerosis (HCC)    Arthritis    Barrett's esophagus    Bilateral carotid artery disease ([place])    a.) carotid doppler 07/18/2020: 3-79% RICA, 02-40% LICA; b.) carotid doppler 97/35/3299: 24-26% LICA, no sig RICA; c.)  carotid doppler 01/23/2021: 5-63% RICA, 87-56% LICA; d.) carotid doppler 43/32/9518: 84-16% LICA, near norm RICA   CKD (chronic kidney disease), stage III (HCC)    COPD (chronic obstructive pulmonary disease) ([place])    Coronary artery disease 04/21/2004   a.) LHC 04/21/2004 [facility]): EF 66%, 50% dLCx, 75% pLCx, 75% oRCA, 50% pRCA --> transferred to [facility] for CVTS consult; b.) 3v CABG   Dyspnea on exertion    GERD (gastroesophageal reflux disease)    Hiatal hernia    Hyperlipidemia    Hypertension    Long term current use of anticoagulant    a.) reduced dose apixaban   Macular degeneration    OSA on CPAP    PAD (peripheral artery disease) ([place]) 12/10/2016   a.) PTA of BILATERAL common iliac arteries --> 7 x 26 mm  stents placed   PAF (paroxysmal atrial fibrillation) (HCC)    a.) CHA2DS2-VASc = 6 (age x 2, sex, HTN, vascular disease, T2DM);  b.) rate/rhythm maintained on oral metoprolol tartrate; chronically anticoagulated using dose reduced apixaban   Peripheral edema    PSVT (paroxysmal supraventricular tachycardia)    S/P CABG x 3 04/2004   Schatzki's ring    Scoliosis of thoracolumbar spine    Valvular heart disease    a.) TTE 04/20/2004: EF >55%, mild MR; b.)  TTE 04/10/2021: EF 50-55%, LVH, reduced RV SF, RVE, moderate BAE, mild-mod MR, severe TR.    Past Surgical History:  Procedure Laterality Date   ABDOMINAL HYSTERECTOMY  1980   due to dysfunctional uterine bleeding   APPENDECTOMY  1980   BREAST SURGERY Left 2000   biopsy   CARDIAC CATHETERIZATION     COLONOSCOPY     CORONARY ARTERY BYPASS GRAFT  2006   ESOPHAGOGASTRODUODENOSCOPY (EGD) WITH PROPOFOL N/A 06/08/2021   Procedure: ESOPHAGOGASTRODUODENOSCOPY (EGD) WITH PROPOFOL;  Surgeon: [name], MD;  Location: [facility] ENDOSCOPY;  Service: Gastroenterology;  Laterality: N/A;   HEMORRHOID SURGERY     INSERTION OF MESH  09/05/2021   Procedure: INSERTION OF MESH;  Surgeon: Jules Husbands, MD;  Location: [facility] ORS;  Service: General;;   LOWER EXTREMITY ANGIOGRAPHY Right 02/25/2017   Procedure: LOWER EXTREMITY ANGIOGRAPHY;  Surgeon: Algernon Huxley, MD;  Location: [place] [facility] LAB;  Service: Cardiovascular;  Laterality: Right;   LOWER EXTREMITY INTERVENTION  02/25/2017   Procedure: LOWER EXTREMITY INTERVENTION;  Surgeon: Algernon Huxley, MD;  Location: [place] [facility];  Service: Cardiovascular;;   LUNG BIOPSY  1999   Negative   TONSILLECTOMY     XI ROBOTIC ASSISTED PARAESOPHAGEAL HERNIA REPAIR N/A 09/05/2021   Procedure: XI ROBOTIC ASSISTED PARAESOPHAGEAL HERNIA REPAIR, RNFA to assist;  Surgeon: Jules Husbands, MD;  Location: [facility] ORS;  Service: General;  Laterality: N/A;     Social History   Tobacco Use   Smoking status: Former     Packs/day: 0.50    Years: 5.00    Total pack years: 2.50    Types: Cigarettes    Quit date: 04/24/2004    Years since quitting: 17.6    Passive exposure: Past   Smokeless tobacco: Never  Vaping Use   Vaping Use: Never used  Substance Use Topics   Alcohol use: No   Drug use: No      Family History  Problem Relation Age of Onset   Hypertension Mother    Hyperlipidemia Mother    Alzheimer's disease Mother    CAD Mother    Heart attack Father  Lung disease Sister    Heart disease Sister    Diabetes Paternal Grandfather        Type 2   Hearing loss Son      Allergies  Allergen Reactions   Baclofen Other (See Comments)   Codeine Nausea Only   Plavix [Clopidogrel]     brusing all over   Sulfa Antibiotics Nausea Only   Latex Rash     REVIEW OF SYSTEMS (Negative unless checked)  Constitutional: '[]'$ Weight loss  '[]'$ Fever  '[]'$ Chills Cardiac: '[]'$ Chest pain   '[]'$ Chest pressure   '[]'$ Palpitations   '[]'$ Shortness of breath when laying flat   '[]'$ Shortness of breath at rest   '[x]'$ Shortness of breath with exertion. Vascular:  '[]'$ Pain in legs with walking   '[]'$ Pain in legs at rest   '[]'$ Pain in legs when laying flat   '[]'$ Claudication   '[]'$ Pain in feet when walking  '[]'$ Pain in feet at rest  '[]'$ Pain in feet when laying flat   '[]'$ History of DVT   '[]'$ Phlebitis   '[x]'$ Swelling in legs   '[]'$ Varicose veins   '[]'$ Non-healing ulcers Pulmonary:   '[]'$ Uses home oxygen   '[]'$ Productive cough   '[]'$ Hemoptysis   '[]'$ Wheeze  '[x]'$ COPD   '[]'$ Asthma Neurologic:  '[]'$ Dizziness  '[]'$ Blackouts   '[]'$ Seizures   '[]'$ History of stroke   '[]'$ History of TIA  '[]'$ Aphasia   '[]'$ Temporary blindness   '[]'$ Dysphagia   '[]'$ Weakness or numbness in arms   '[]'$ Weakness or numbness in legs Musculoskeletal:  '[x]'$ Arthritis   '[]'$ Joint swelling   '[x]'$ Joint pain   '[]'$ Low back pain Hematologic:  '[]'$ Easy bruising  '[]'$ Easy bleeding   '[]'$ Hypercoagulable state   '[]'$ Anemic   Gastrointestinal:  '[]'$ Blood in stool   '[]'$ Vomiting blood  '[x]'$ Gastroesophageal reflux/heartburn   '[]'$ Abdominal  pain Genitourinary:  '[x]'$ Chronic kidney disease   '[]'$ Difficult urination  '[]'$ Frequent urination  '[]'$ Burning with urination   '[]'$ Hematuria Skin:  '[]'$ Rashes   '[]'$ Ulcers   '[]'$ Wounds Psychological:  '[]'$ History of anxiety   '[]'$  History of major depression.  Physical Examination  BP 129/74 (BP Location: Left Arm)   Pulse 73   Resp 18   Ht '5\' 5"'$  (1.651 m)   Wt 159 lb 12.8 oz (72.5 kg)   BMI 26.59 kg/m  Gen:  WD/WN, NAD. Appears younger than stated age. Head: [place]/AT, No temporalis wasting. Ear/Nose/Throat: Hearing grossly intact, nares w/o erythema or drainage Eyes: Conjunctiva clear. Sclera non-icteric Neck: Supple.  Trachea midline Pulmonary:  Good air movement, no use of accessory muscles.  Cardiac: irregular Vascular:  Vessel Right Left  Radial Palpable Palpable           Musculoskeletal: M/S 5/5 throughout.  No deformity or atrophy. 1-2+ RLE edema, 2+ LLE edema. Neurologic: Sensation grossly intact in extremities.  Symmetrical.  Speech is fluent.  Psychiatric: Judgment intact, Mood & affect appropriate for pt's clinical situation. Dermatologic: No rashes or ulcers noted.  No cellulitis or open wounds.      Labs Recent Results (from the past 2160 hour(s))  CBC     Status: Abnormal   Collection Time: 09/07/21 11:46 AM  Result Value Ref Range   WBC 8.4 4.0 - 10.5 K/uL   RBC 3.32 (L) 3.87 - 5.11 MIL/uL   Hemoglobin 11.1 (L) 12.0 - 15.0 g/dL   HCT 34.6 (L) 36.0 - 46.0 %   MCV 104.2 (H) 80.0 - 100.0 fL   MCH 33.4 26.0 - 34.0 pg   MCHC 32.1 30.0 - 36.0 g/dL   RDW 13.7 11.5 - 15.5 %   Platelets 130 (L) 150 -  400 K/uL   nRBC 0.0 0.0 - 0.2 %    Comment: Performed at [facility], [address] [place], [place] [address]  Basic metabolic panel     Status: Abnormal   Collection Time: 09/07/21 11:46 AM  Result Value Ref Range   Sodium 133 (L) 135 - 145 mmol/L   Potassium 4.5 3.5 - 5.1 mmol/L   Chloride 105 98 - 111 mmol/L   CO2 21 (L) 22 - 32 mmol/L   Glucose, Bld 96 70 -  99 mg/dL    Comment: Glucose reference range applies only to samples taken after fasting for at least 8 hours.   BUN 35 (H) 8 - 23 mg/dL   Creatinine, Ser 1.56 (H) 0.44 - 1.00 mg/dL   Calcium 7.7 (L) 8.9 - 10.3 mg/dL   GFR, Estimated 33 (L) >60 mL/min    Comment: (NOTE) Calculated using the CKD-EPI Creatinine Equation (2021)    Anion gap 7 5 - 15    Comment: Performed at [facility], [address] [place], [place] [address]  Respiratory (~20 pathogens) panel by PCR     Status: None   Collection Time: 09/07/21  2:05 PM   Specimen: Nasopharyngeal Swab; Respiratory  Result Value Ref Range   Adenovirus NOT DETECTED NOT DETECTED   Coronavirus 229E NOT DETECTED NOT DETECTED    Comment: (NOTE) The Coronavirus on the Respiratory Panel, DOES NOT test for the novel  Coronavirus (2019 nCoV)    Coronavirus HKU1 NOT DETECTED NOT DETECTED   Coronavirus NL63 NOT DETECTED NOT DETECTED   Coronavirus OC43 NOT DETECTED NOT DETECTED   Metapneumovirus NOT DETECTED NOT DETECTED   Rhinovirus / Enterovirus NOT DETECTED NOT DETECTED   Influenza A NOT DETECTED NOT DETECTED   Influenza B NOT DETECTED NOT DETECTED   Parainfluenza Virus 1 NOT DETECTED NOT DETECTED   Parainfluenza Virus 2 NOT DETECTED NOT DETECTED   Parainfluenza Virus 3 NOT DETECTED NOT DETECTED   Parainfluenza Virus 4 NOT DETECTED NOT DETECTED   Respiratory Syncytial Virus NOT DETECTED NOT DETECTED   Bordetella pertussis NOT DETECTED NOT DETECTED   Bordetella Parapertussis NOT DETECTED NOT DETECTED   Chlamydophila pneumoniae NOT DETECTED NOT DETECTED   Mycoplasma pneumoniae NOT DETECTED NOT DETECTED    Comment: Performed at [facility] [address] [place], [place] [address]  Culture, blood (Routine X 2) w Reflex to ID Panel     Status: None   Collection Time: 09/07/21  2:43 PM   Specimen: BLOOD  Result Value Ref Range   Specimen Description BLOOD BLOOD RIGHT HAND    Special Requests      BOTTLES DRAWN AEROBIC  AND ANAEROBIC Blood Culture adequate volume   Culture      NO GROWTH 5 DAYS Performed at [facility], [address], [place], [place] [address]    Report Status 09/12/2021 FINAL   Troponin I (High Sensitivity)     Status: None   Collection Time: 09/07/21  2:44 PM  Result Value Ref Range   Troponin I (High Sensitivity) 15 <18 ng/L    Comment: (NOTE) Elevated high sensitivity troponin I (hsTnI) values and significant  changes across serial measurements may suggest ACS but many other  chronic and acute conditions are known to elevate hsTnI results.  Refer to the "Links" section for chest pain algorithms and additional  guidance. Performed at [facility], [address] [place],  [address]   Culture, blood (Routine X 2) w  Reflex to ID Panel     Status: None   Collection Time: 09/07/21  2:52 PM   Specimen: BLOOD  Result Value Ref Range   Specimen Description BLOOD LEFT ANTECUBITAL    Special Requests      BOTTLES DRAWN AEROBIC AND ANAEROBIC Blood Culture adequate volume   Culture      NO GROWTH 5 DAYS Performed at [facility], [address] [place], [place] [address]    Report Status 09/12/2021 FINAL   Aerobic/Anaerobic Culture w Gram Stain (surgical/deep wound)     Status: None   Collection Time: 09/07/21  4:24 PM   Specimen: Pleural Fluid  Result Value Ref Range   Specimen Description      PLEURAL Performed at [facility], [address] [place], [place] [address]    Special Requests LEFT    Gram Stain      FEW WBC PRESENT,BOTH PMN AND MONONUCLEAR NO ORGANISMS SEEN    Culture      No growth aerobically or anaerobically. Performed at [facility], [place] [address], [place], [place] [address]    Report Status 09/14/2021 FINAL   Troponin I (High Sensitivity)     Status: None   Collection Time: 09/07/21  5:17 PM  Result Value Ref Range   Troponin I (High Sensitivity) 13 <18 ng/L    Comment: (NOTE) Elevated high  sensitivity troponin I (hsTnI) values and significant  changes across serial measurements may suggest ACS but many other  chronic and acute conditions are known to elevate hsTnI results.  Refer to the "Links" section for chest pain algorithms and additional  guidance. Performed at [facility], [address] [place], [place] [address]   CBC     Status: Abnormal   Collection Time: 09/08/21  5:50 AM  Result Value Ref Range   WBC 9.4 4.0 - 10.5 K/uL   RBC 3.40 (L) 3.87 - 5.11 MIL/uL   Hemoglobin 11.3 (L) 12.0 - 15.0 g/dL   HCT 34.7 (L) 36.0 - 46.0 %   MCV 102.1 (H) 80.0 - 100.0 fL   MCH 33.2 26.0 - 34.0 pg   MCHC 32.6 30.0 - 36.0 g/dL   RDW 13.8 11.5 - 15.5 %   Platelets 118 (L) 150 - 400 K/uL   nRBC 0.0 0.0 - 0.2 %    Comment: Performed at [facility], [address] [place], [place] [address]  Magnesium     Status: None   Collection Time: 09/08/21  5:50 AM  Result Value Ref Range   Magnesium 2.0 1.7 - 2.4 mg/dL    Comment: Performed at [facility], [address] [place], [place] [address]  Comprehensive metabolic panel     Status: Abnormal   Collection Time: 09/08/21  5:50 AM  Result Value Ref Range   Sodium 132 (L) 135 - 145 mmol/L   Potassium 5.1 3.5 - 5.1 mmol/L   Chloride 106 98 - 111 mmol/L   CO2 21 (L) 22 - 32 mmol/L   Glucose, Bld 119 (H) 70 - 99 mg/dL    Comment: Glucose reference range applies only to samples taken after fasting for at least 8 hours.   BUN 44 (H) 8 - 23 mg/dL   Creatinine, Ser 1.95 (H) 0.44 - 1.00 mg/dL   Calcium 7.3 (L) 8.9 - 10.3 mg/dL   Total Protein 5.2 (L) 6.5 - 8.1 g/dL   Albumin 2.6 (L) 3.5 - 5.0 g/dL   AST 70 (  H) 15 - 41 U/L   ALT 24 0 - 44 U/L   Alkaline Phosphatase 71 38 - 126 U/L   Total Bilirubin 1.4 (H) 0.3 - 1.2 mg/dL   GFR, Estimated 25 (L) >60 mL/min    Comment: (NOTE) Calculated using the CKD-EPI Creatinine Equation (2021)    Anion gap 5 5 - 15    Comment: Performed at [facility], [address] [place], [place] [address]  APTT     Status: Abnormal   Collection Time: 09/08/21 10:34 AM  Result Value Ref Range   aPTT 39 (H) 24 - 36 seconds    Comment:        IF BASELINE aPTT IS ELEVATED, SUGGEST PATIENT RISK ASSESSMENT BE USED TO DETERMINE APPROPRIATE ANTICOAGULANT THERAPY. Performed at [facility], [address] [place], [place] [address]   Protime-INR     Status: Abnormal   Collection Time: 09/08/21 10:34 AM  Result Value Ref Range   Prothrombin Time 18.5 (H) 11.4 - 15.2 seconds   INR 1.6 (H) 0.8 - 1.2    Comment: (NOTE) INR goal varies based on device and disease states. Performed at [facility], [place], [place] [address]   Heparin level (unfractionated)     Status: None   Collection Time: 09/08/21  7:08 PM  Result Value Ref Range   Heparin Unfractionated 0.48 0.30 - 0.70 IU/mL    Comment: (NOTE) The clinical reportable range upper limit is being lowered to >1.10 to align with the FDA approved guidance for the current laboratory assay.  If heparin results are below expected values, and patient dosage has  been confirmed, suggest follow up testing of antithrombin III levels. Performed at [facility], [address] [place], [place] [address]   CBC     Status: Abnormal   Collection Time: 09/09/21  4:10 AM  Result Value Ref Range   WBC 4.5 4.0 - 10.5 K/uL   RBC 2.97 (L) 3.87 - 5.11 MIL/uL   Hemoglobin 10.0 (L) 12.0 - 15.0 g/dL   HCT 30.6 (L) 36.0 - 46.0 %   MCV 103.0 (H) 80.0 - 100.0 fL   MCH 33.7 26.0 - 34.0 pg   MCHC 32.7 30.0 - 36.0 g/dL   RDW 14.0 11.5 - 15.5 %   Platelets 110 (L) 150 - 400 K/uL   nRBC 0.7 (H) 0.0 - 0.2 %    Comment: Performed at [facility], [address] [place], [place] [address]  Basic metabolic panel     Status: Abnormal   Collection Time: 09/09/21  4:10 AM  Result Value Ref Range   Sodium 136 135 - 145 mmol/L   Potassium 4.0 3.5 - 5.1 mmol/L    Chloride 109 98 - 111 mmol/L   CO2 20 (L) 22 - 32 mmol/L   Glucose, Bld 104 (H) 70 - 99 mg/dL    Comment: Glucose reference range applies only to samples taken after fasting for at least 8 hours.   BUN 48 (H) 8 - 23 mg/dL   Creatinine, Ser 1.85 (H) 0.44 - 1.00 mg/dL   Calcium 7.2 (L) 8.9 - 10.3 mg/dL   GFR, Estimated 27 (L) >60 mL/min    Comment: (NOTE) Calculated using the CKD-EPI Creatinine Equation (2021)    Anion gap 7 5 - 15    Comment: Performed at [facility] [address], [place],  [address]  Phosphorus     Status: None  Collection Time: 09/09/21  4:10 AM  Result Value Ref Range   Phosphorus 3.9 2.5 - 4.6 mg/dL    Comment: Performed at [facility], [address] [place], [place] [address]  Magnesium     Status: None   Collection Time: 09/09/21  4:10 AM  Result Value Ref Range   Magnesium 2.2 1.7 - 2.4 mg/dL    Comment: Performed at [facility], [place], [place] [address]  Heparin level (unfractionated)     Status: None   Collection Time: 09/09/21  4:10 AM  Result Value Ref Range   Heparin Unfractionated 0.49 0.30 - 0.70 IU/mL    Comment: (NOTE) The clinical reportable range upper limit is being lowered to >1.10 to align with the FDA approved guidance for the current laboratory assay.  If heparin results are below expected values, and patient dosage has  been confirmed, suggest follow up testing of antithrombin III levels. Performed at [facility], [place], [place] [address]   Heparin induced platelet Ab (HIT antibody)     Status: None   Collection Time: 09/09/21 12:36 PM  Result Value Ref Range   Heparin Induced Plt Ab 0.069 0.000 - 0.400 OD    Comment: (NOTE) Performed At: [facility] [place], [place] [id number] Rush Farmer MD [id number]   Heparin level (unfractionated)     Status: None   Collection Time: 09/10/21  6:29 AM  Result Value Ref  Range   Heparin Unfractionated 0.43 0.30 - 0.70 IU/mL    Comment: (NOTE) The clinical reportable range upper limit is being lowered to >1.10 to align with the FDA approved guidance for the current laboratory assay.  If heparin results are below expected values, and patient dosage has  been confirmed, suggest follow up testing of antithrombin III levels. Performed at [facility], [address] [place], [place] [address]   CBC     Status: Abnormal   Collection Time: 09/10/21  6:29 AM  Result Value Ref Range   WBC 3.7 (L) 4.0 - 10.5 K/uL   RBC 2.95 (L) 3.87 - 5.11 MIL/uL   Hemoglobin 9.6 (L) 12.0 - 15.0 g/dL   HCT 30.5 (L) 36.0 - 46.0 %   MCV 103.4 (H) 80.0 - 100.0 fL   MCH 32.5 26.0 - 34.0 pg   MCHC 31.5 30.0 - 36.0 g/dL   RDW 14.4 11.5 - 15.5 %   Platelets 126 (L) 150 - 400 K/uL   nRBC 0.0 0.0 - 0.2 %    Comment: Performed at [facility], [address], [place],  [address]  Basic metabolic panel     Status: Abnormal   Collection Time: 09/10/21  6:29 AM  Result Value Ref Range   Sodium 140 135 - 145 mmol/L   Potassium 3.7 3.5 - 5.1 mmol/L   Chloride 116 (H) 98 - 111 mmol/L   CO2 20 (L) 22 - 32 mmol/L   Glucose, Bld 111 (H) 70 - 99 mg/dL    Comment: Glucose reference range applies only to samples taken after fasting for at least 8 hours.   BUN 32 (H) 8 - 23 mg/dL   Creatinine, Ser 1.35 (H) 0.44 - 1.00 mg/dL   Calcium 7.5 (L) 8.9 - 10.3 mg/dL   GFR, Estimated 39 (L) >60 mL/min    Comment: (NOTE) Calculated using the CKD-EPI Creatinine Equation (2021)    Anion gap 4 (L) 5 - 15  Comment: Performed at [facility], [address] [place], [place] [address]  ECHOCARDIOGRAM COMPLETE     Status: None   Collection Time: 09/10/21 11:38 AM  Result Value Ref Range   Weight 2,688 oz   Height 66 in   BP 147/65 mmHg   Ao pk vel 1.03 m/s   AV Area VTI 1.59 cm2   AR max vel 1.76 cm2   AV Mean grad 3.0 mmHg   AV Peak grad 4.2 mmHg   S'  Lateral 3.29 cm   AV Area mean vel 1.55 cm2   Area-P 1/2 5.94 cm2  Heparin level (unfractionated)     Status: Abnormal   Collection Time: 09/11/21  4:44 AM  Result Value Ref Range   Heparin Unfractionated 0.24 (L) 0.30 - 0.70 IU/mL    Comment: (NOTE) The clinical reportable range upper limit is being lowered to >1.10 to align with the FDA approved guidance for the current laboratory assay.  If heparin results are below expected values, and patient dosage has  been confirmed, suggest follow up testing of antithrombin III levels. Performed at [facility], [address] [place], [place] [address]   CBC     Status: Abnormal   Collection Time: 09/11/21  4:44 AM  Result Value Ref Range   WBC 3.0 (L) 4.0 - 10.5 K/uL   RBC 3.24 (L) 3.87 - 5.11 MIL/uL   Hemoglobin 10.6 (L) 12.0 - 15.0 g/dL   HCT 33.5 (L) 36.0 - 46.0 %   MCV 103.4 (H) 80.0 - 100.0 fL   MCH 32.7 26.0 - 34.0 pg   MCHC 31.6 30.0 - 36.0 g/dL   RDW 14.7 11.5 - 15.5 %   Platelets 148 (L) 150 - 400 K/uL   nRBC 0.0 0.0 - 0.2 %    Comment: Performed at [facility], [address], [place], [place] [address]  Basic metabolic panel     Status: Abnormal   Collection Time: 09/11/21  4:44 AM  Result Value Ref Range   Sodium 145 135 - 145 mmol/L   Potassium 3.9 3.5 - 5.1 mmol/L   Chloride 118 (H) 98 - 111 mmol/L   CO2 20 (L) 22 - 32 mmol/L   Glucose, Bld 99 70 - 99 mg/dL    Comment: Glucose reference range applies only to samples taken after fasting for at least 8 hours.   BUN 21 8 - 23 mg/dL   Creatinine, Ser 1.05 (H) 0.44 - 1.00 mg/dL   Calcium 8.1 (L) 8.9 - 10.3 mg/dL   GFR, Estimated 53 (L) >60 mL/min    Comment: (NOTE) Calculated using the CKD-EPI Creatinine Equation (2021)    Anion gap 7 5 - 15    Comment: Performed at [facility], [place], [place] [address]  Heparin level (unfractionated)     Status: None   Collection Time: 09/11/21  2:12 PM  Result Value Ref Range    Heparin Unfractionated 0.43 0.30 - 0.70 IU/mL    Comment: (NOTE) The clinical reportable range upper limit is being lowered to >1.10 to align with the FDA approved guidance for the current laboratory assay.  If heparin results are below expected values, and patient dosage has  been confirmed, suggest follow up testing of antithrombin III levels. Performed at [facility], [place], [place] [address]   Heparin level (unfractionated)     Status: None   Collection Time: 09/11/21  9:49 PM  Result Value  Ref Range   Heparin Unfractionated 0.48 0.30 - 0.70 IU/mL    Comment: (NOTE) The clinical reportable range upper limit is being lowered to >1.10 to align with the FDA approved guidance for the current laboratory assay.  If heparin results are below expected values, and patient dosage has  been confirmed, suggest follow up testing of antithrombin III levels. Performed at [facility], [address] [place], [place] [address]   CBC     Status: Abnormal   Collection Time: 09/12/21  5:18 AM  Result Value Ref Range   WBC 3.8 (L) 4.0 - 10.5 K/uL   RBC 3.04 (L) 3.87 - 5.11 MIL/uL   Hemoglobin 10.0 (L) 12.0 - 15.0 g/dL   HCT 31.1 (L) 36.0 - 46.0 %   MCV 102.3 (H) 80.0 - 100.0 fL   MCH 32.9 26.0 - 34.0 pg   MCHC 32.2 30.0 - 36.0 g/dL   RDW 14.6 11.5 - 15.5 %   Platelets 150 150 - 400 K/uL   nRBC 0.8 (H) 0.0 - 0.2 %    Comment: Performed at [facility], [address], [place], [place] [address]  Basic metabolic panel     Status: Abnormal   Collection Time: 09/12/21  5:18 AM  Result Value Ref Range   Sodium 141 135 - 145 mmol/L   Potassium 3.4 (L) 3.5 - 5.1 mmol/L   Chloride 116 (H) 98 - 111 mmol/L   CO2 21 (L) 22 - 32 mmol/L   Glucose, Bld 101 (H) 70 - 99 mg/dL    Comment: Glucose reference range applies only to samples taken after fasting for at least 8 hours.   BUN 19 8 - 23 mg/dL   Creatinine, Ser 1.07 (H) 0.44 - 1.00 mg/dL    Calcium 7.8 (L) 8.9 - 10.3 mg/dL   GFR, Estimated 52 (L) >60 mL/min    Comment: (NOTE) Calculated using the CKD-EPI Creatinine Equation (2021)    Anion gap 4 (L) 5 - 15    Comment: Performed at [facility], [place], [place] [address]  Heparin level (unfractionated)     Status: None   Collection Time: 09/12/21  5:18 AM  Result Value Ref Range   Heparin Unfractionated 0.41 0.30 - 0.70 IU/mL    Comment: (NOTE) The clinical reportable range upper limit is being lowered to >1.10 to align with the FDA approved guidance for the current laboratory assay.  If heparin results are below expected values, and patient dosage has  been confirmed, suggest follow up testing of antithrombin III levels. Performed at [facility], [place], [place] [address]   Heparin level (unfractionated)     Status: None   Collection Time: 09/13/21  5:38 AM  Result Value Ref Range   Heparin Unfractionated 0.39 0.30 - 0.70 IU/mL    Comment: (NOTE) The clinical reportable range upper limit is being lowered to >1.10 to align with the FDA approved guidance for the current laboratory assay.  If heparin results are below expected values, and patient dosage has  been confirmed, suggest follow up testing of antithrombin III levels. Performed at [facility], [address] [place], [place] [address]   CBC     Status: Abnormal   Collection Time: 09/13/21  5:38 AM  Result Value Ref Range   WBC 6.1 4.0 - 10.5 K/uL   RBC 2.91 (L) 3.87 - 5.11 MIL/uL   Hemoglobin 9.8 (L) 12.0 - 15.0 g/dL  HCT 30.2 (L) 36.0 - 46.0 %   MCV 103.8 (H) 80.0 - 100.0 fL   MCH 33.7 26.0 - 34.0 pg   MCHC 32.5 30.0 - 36.0 g/dL   RDW 14.6 11.5 - 15.5 %   Platelets 172 150 - 400 K/uL   nRBC 0.7 (H) 0.0 - 0.2 %    Comment: Performed at [facility], [address] [place], [place] [address]  Phosphorus     Status: Abnormal   Collection Time: 09/13/21  5:38 AM  Result  Value Ref Range   Phosphorus 2.1 (L) 2.5 - 4.6 mg/dL    Comment: Performed at [facility], [address] [place], [place] [address]  Magnesium     Status: None   Collection Time: 09/13/21  5:38 AM  Result Value Ref Range   Magnesium 2.0 1.7 - 2.4 mg/dL    Comment: Performed at [facility], [place], [place] [address]  Heparin level (unfractionated)     Status: None   Collection Time: 09/14/21  5:20 AM  Result Value Ref Range   Heparin Unfractionated 0.39 0.30 - 0.70 IU/mL    Comment: (NOTE) The clinical reportable range upper limit is being lowered to >1.10 to align with the FDA approved guidance for the current laboratory assay.  If heparin results are below expected values, and patient dosage has  been confirmed, suggest follow up testing of antithrombin III levels. Performed at [facility], [address] [place], [place] [address]   CBC     Status: Abnormal   Collection Time: 09/14/21  5:20 AM  Result Value Ref Range   WBC 6.0 4.0 - 10.5 K/uL   RBC 2.95 (L) 3.87 - 5.11 MIL/uL   Hemoglobin 9.8 (L) 12.0 - 15.0 g/dL   HCT 30.1 (L) 36.0 - 46.0 %   MCV 102.0 (H) 80.0 - 100.0 fL   MCH 33.2 26.0 - 34.0 pg   MCHC 32.6 30.0 - 36.0 g/dL   RDW 14.5 11.5 - 15.5 %   Platelets 192 150 - 400 K/uL   nRBC 0.3 (H) 0.0 - 0.2 %    Comment: Performed at [facility], [address], [place], [place] [address]  Basic metabolic panel     Status: Abnormal   Collection Time: 09/14/21  5:20 AM  Result Value Ref Range   Sodium 138 135 - 145 mmol/L   Potassium 3.1 (L) 3.5 - 5.1 mmol/L   Chloride 108 98 - 111 mmol/L   CO2 24 22 - 32 mmol/L   Glucose, Bld 101 (H) 70 - 99 mg/dL    Comment: Glucose reference range applies only to samples taken after fasting for at least 8 hours.   BUN 12 8 - 23 mg/dL   Creatinine, Ser 0.92 0.44 - 1.00 mg/dL   Calcium 7.7 (L) 8.9 - 10.3 mg/dL   GFR, Estimated >60 >60 mL/min    Comment:  (NOTE) Calculated using the CKD-EPI Creatinine Equation (2021)    Anion gap 6 5 - 15    Comment: Performed at [facility], [address] [place], [place] [address]  Triglycerides, Body Fluid     Status: None   Collection Time: 09/14/21  5:38 AM  Result Value Ref Range   Triglycerides, Fluid 21 Not Estab. mg/dL    Comment: (NOTE) The reference interval(s) and other method performance specifications have not been established for this body fluid. The test result must be integrated into the clinical  context for interpretation. Performed At: [facility] [address] [place], [place] [id number] Rush Farmer MD YQ:[id number]    Fluid Type-FTRIG PLEURAL     Comment: Performed at [facility] [address] [place], [place] [address] CORRECTED ON 08/10 AT 1133: PREVIOUSLY REPORTED AS Pleural Fld   Cholesterol, body fluid     Status: None   Collection Time: 09/14/21  5:38 AM  Result Value Ref Range   Cholesterol, Fluid 17 mg/dL    Comment: (NOTE) INTERPRETIVE INFORMATION: Cholesterol, Body Fluid For information on body fluid reference ranges and/or interpretive guidance visit [email or web address] This test was developed and its performance characteristics determined by [employer]. It has not been cleared or approved by the [place] Food and Drug Administration. This test was performed in a CLIA certified laboratory and is intended for clinical purposes. Performed At: [facility] [address] [place], [place] [id number] Olen Cordial R MDPhD OZ:[id number]    Chol, Fluid Type PLEURAL     Comment: Performed at [facility], [place], [place] [address] CORRECTED ON 08/10 AT 1133: PREVIOUSLY REPORTED AS Pleural Fld   Protein, total     Status: Abnormal   Collection Time: 09/14/21  9:20 AM  Result Value Ref Range   Total Protein 5.6 (L) 6.5 - 8.1 g/dL    Comment: Performed at [facility], [address] [place], [place] [address]  Lactate dehydrogenase     Status: None   Collection Time: 09/14/21  9:20 AM  Result Value Ref Range   LDH 170 98 - 192 U/L    Comment: Performed at [facility], [address] [place], [place] [address]  Protein, body fluid (other)     Status: None   Collection Time: 09/14/21  9:53 AM  Result Value Ref Range   Total Protein, Body Fluid Other 1.4 g/dL    Comment: (NOTE)             _________________________________________            : BODY FLUID TYPE :     TOTAL PROTEIN     :            :_________________:_______________________:            : Amniotic Fluid  :               <0.4    :            :_________________:_______________________:            :                 : Nonmalignant: <3.0    :            : Ascitic Fluid   : Malignant:    >3.0    :            :_________________:_______________________:            : Bile, Clear     :               <0.9    :            :_________________:_______________________:            : Bile, Yellow    :          0.2 - 0.6    :            :_________________:_______________________:            :  Lymph           :          2.2 - 6.0    :            :_________________:_______________________:            : Human Milk      :          1.9 - 2.0    :            :_________________: ______________________:            : Nasal Secretion :          0.1 - 3.5    :            :_________________:___________ ____________:            : Pancreatic      :          0.0 - 0.1    :            : Juice           :    (post stimulation) :            :_________________:_______________________:            :                 : Transudate:   <0.3    :            : Pleural Fluid   : Exudate:      >0.3    :            :_________________:_______________________:            : Saliva          :          0.1 - 0.2    :            : (Mixed Glands)  :                       :             :_________________:_______________________:            : Synovial Fluid  :               <2.5    :            :_________________:_______________________:            : Tears           :          0.8 - 0.9    :            :_________________:_______________________:             Louanne Skye, Karene Fry Reference Intervals             for  Adults and Children 2008. Ninth             Edition (V9.1) [place],             [place]; [place]: July 2009. Performed At: [facility] [address] [place], [place] [id number] Rush Farmer MD ON:[id number]    Source of Sample PLEURAL     Comment: Performed at [facility], [place], [place] [address]  Lactate dehydrogenase (pleural or peritoneal fluid)     Status: Abnormal  Collection Time: 09/14/21  9:53 AM  Result Value Ref Range   LD, Fluid 122 (H) 3 - 23 U/L    Comment: (NOTE) Results should be evaluated in conjunction with serum values    Fluid Type-FLDH PLEURAL     Comment: Performed at [facility], [address] [place], [place] [address] CORRECTED ON 08/10 AT 1051: PREVIOUSLY REPORTED AS Pleural R   Heparin level (unfractionated)     Status: None   Collection Time: 09/15/21  4:56 AM  Result Value Ref Range   Heparin Unfractionated 0.40 0.30 - 0.70 IU/mL    Comment: (NOTE) The clinical reportable range upper limit is being lowered to >1.10 to align with the FDA approved guidance for the current laboratory assay.  If heparin results are below expected values, and patient dosage has  been confirmed, suggest follow up testing of antithrombin III levels. Performed at [facility], [address] [place], [place] [address]   CBC     Status: Abnormal   Collection Time: 09/15/21  4:56 AM  Result Value Ref Range   WBC 7.6 4.0 - 10.5 K/uL   RBC 3.03 (L) 3.87 - 5.11 MIL/uL   Hemoglobin 10.0 (L) 12.0 - 15.0 g/dL   HCT 30.9 (L) 36.0 - 46.0 %   MCV 102.0 (H) 80.0 -  100.0 fL   MCH 33.0 26.0 - 34.0 pg   MCHC 32.4 30.0 - 36.0 g/dL   RDW 14.6 11.5 - 15.5 %   Platelets 213 150 - 400 K/uL   nRBC 0.0 0.0 - 0.2 %    Comment: Performed at [facility], [address], [place], [place] [address]  Basic metabolic panel     Status: Abnormal   Collection Time: 09/15/21  4:56 AM  Result Value Ref Range   Sodium 138 135 - 145 mmol/L   Potassium 3.9 3.5 - 5.1 mmol/L   Chloride 108 98 - 111 mmol/L   CO2 25 22 - 32 mmol/L   Glucose, Bld 112 (H) 70 - 99 mg/dL    Comment: Glucose reference range applies only to samples taken after fasting for at least 8 hours.   BUN 14 8 - 23 mg/dL   Creatinine, Ser 0.95 0.44 - 1.00 mg/dL   Calcium 8.0 (L) 8.9 - 10.3 mg/dL   GFR, Estimated 59 (L) >60 mL/min    Comment: (NOTE) Calculated using the CKD-EPI Creatinine Equation (2021)    Anion gap 5 5 - 15    Comment: Performed at [facility], [address] [place], [place] [address]  Brain natriuretic peptide     Status: Abnormal   Collection Time: 09/15/21  2:54 PM  Result Value Ref Range   B Natriuretic Peptide 106.5 (H) 0.0 - 100.0 pg/mL    Comment: Performed at [facility], [address] [place], [place] [address]  CBC     Status: Abnormal   Collection Time: 09/16/21  4:41 AM  Result Value Ref Range   WBC 10.6 (H) 4.0 - 10.5 K/uL   RBC 3.14 (L) 3.87 - 5.11 MIL/uL   Hemoglobin 11.3 (L) 12.0 - 15.0 g/dL   HCT 32.0 (L) 36.0 - 46.0 %   MCV 101.9 (H) 80.0 - 100.0 fL   MCH 36.0 (H) 26.0 - 34.0 pg   MCHC 35.3 30.0 - 36.0 g/dL   RDW 15.0 11.5 - 15.5 %   Platelets 303 150 - 400 K/uL   nRBC 0.0 0.0 - 0.2 %  Comment: Performed at [facility], [address] [place], [place] [address]  CBC     Status: Abnormal   Collection Time: 09/17/21  4:55 AM  Result Value Ref Range   WBC 8.6 4.0 - 10.5 K/uL   RBC 2.91 (L) 3.87 - 5.11 MIL/uL   Hemoglobin 9.8 (L) 12.0 - 15.0 g/dL   HCT 29.6 (L) 36.0 - 46.0 %   MCV 101.7 (H) 80.0 - 100.0  fL   MCH 33.7 26.0 - 34.0 pg   MCHC 33.1 30.0 - 36.0 g/dL   RDW 14.8 11.5 - 15.5 %   Platelets 278 150 - 400 K/uL   nRBC 0.0 0.0 - 0.2 %    Comment: Performed at [facility], [address], [place], [place] [address]  Basic metabolic panel     Status: Abnormal   Collection Time: 09/17/21  4:55 AM  Result Value Ref Range   Sodium 133 (L) 135 - 145 mmol/L   Potassium 5.2 (H) 3.5 - 5.1 mmol/L   Chloride 100 98 - 111 mmol/L   CO2 28 22 - 32 mmol/L   Glucose, Bld 121 (H) 70 - 99 mg/dL    Comment: Glucose reference range applies only to samples taken after fasting for at least 8 hours.   BUN 18 8 - 23 mg/dL   Creatinine, Ser 0.99 0.44 - 1.00 mg/dL   Calcium 8.2 (L) 8.9 - 10.3 mg/dL   GFR, Estimated 57 (L) >60 mL/min    Comment: (NOTE) Calculated using the CKD-EPI Creatinine Equation (2021)    Anion gap 5 5 - 15    Comment: Performed at [facility], [address] [place], [place] [address]  CBC     Status: Abnormal   Collection Time: 09/18/21  4:45 AM  Result Value Ref Range   WBC 7.9 4.0 - 10.5 K/uL   RBC 2.91 (L) 3.87 - 5.11 MIL/uL   Hemoglobin 9.8 (L) 12.0 - 15.0 g/dL   HCT 29.1 (L) 36.0 - 46.0 %   MCV 100.0 80.0 - 100.0 fL   MCH 33.7 26.0 - 34.0 pg   MCHC 33.7 30.0 - 36.0 g/dL   RDW 14.6 11.5 - 15.5 %   Platelets 311 150 - 400 K/uL   nRBC 0.0 0.0 - 0.2 %    Comment: Performed at [facility], [address] [place], [place] [address]  Magnesium     Status: None   Collection Time: 09/25/21 11:58 AM  Result Value Ref Range   Magnesium 2.3 1.7 - 2.4 mg/dL    Comment: Performed at [facility], [address], [place], [place] [address]  Phosphorus     Status: None   Collection Time: 09/25/21 11:58 AM  Result Value Ref Range   Phosphorus 3.2 2.5 - 4.6 mg/dL    Comment: Performed at [facility], [address] [place], [place] [address]  Protime-INR     Status: None   Collection Time: 09/25/21 11:58 AM  Result Value  Ref Range   Prothrombin Time 14.4 11.4 - 15.2 seconds   INR 1.1 0.8 - 1.2    Comment: (NOTE) INR goal varies based on device and disease states. Performed at [facility] [place] [facility], [address] [place], [place] [address]   Comprehensive metabolic panel     Status: Abnormal   Collection Time: 09/25/21 11:58 AM  Result Value Ref Range   Sodium 132 (L) 135 - 145 mmol/L   Potassium 3.2 (L)  3.5 - 5.1 mmol/L   Chloride 92 (L) 98 - 111 mmol/L   CO2 30 22 - 32 mmol/L   Glucose, Bld 142 (H) 70 - 99 mg/dL    Comment: Glucose reference range applies only to samples taken after fasting for at least 8 hours.   BUN 21 8 - 23 mg/dL   Creatinine, Ser 1.03 (H) 0.44 - 1.00 mg/dL   Calcium 9.3 8.9 - 10.3 mg/dL   Total Protein 6.4 (L) 6.5 - 8.1 g/dL   Albumin 2.9 (L) 3.5 - 5.0 g/dL   AST 27 15 - 41 U/L   ALT 17 0 - 44 U/L   Alkaline Phosphatase 144 (H) 38 - 126 U/L   Total Bilirubin 1.0 0.3 - 1.2 mg/dL   GFR, Estimated 54 (L) >60 mL/min    Comment: (NOTE) Calculated using the CKD-EPI Creatinine Equation (2021)    Anion gap 10 5 - 15    Comment: Performed at [facility], [address] [place], [place] [address]  CBC with Differential/Platelet     Status: Abnormal   Collection Time: 09/25/21 11:58 AM  Result Value Ref Range   WBC 6.5 4.0 - 10.5 K/uL   RBC 3.16 (L) 3.87 - 5.11 MIL/uL   Hemoglobin 10.0 (L) 12.0 - 15.0 g/dL   HCT 31.9 (L) 36.0 - 46.0 %   MCV 100.9 (H) 80.0 - 100.0 fL   MCH 31.6 26.0 - 34.0 pg   MCHC 31.3 30.0 - 36.0 g/dL   RDW 13.8 11.5 - 15.5 %   Platelets 549 (H) 150 - 400 K/uL   nRBC 0.0 0.0 - 0.2 %   Neutrophils Relative % 76 %   Neutro Abs 5.0 1.7 - 7.7 K/uL   Lymphocytes Relative 11 %   Lymphs Abs 0.7 0.7 - 4.0 K/uL   Monocytes Relative 10 %   Monocytes Absolute 0.7 0.1 - 1.0 K/uL   Eosinophils Relative 1 %   Eosinophils Absolute 0.0 0.0 - 0.5 K/uL   Basophils Relative 1 %   Basophils Absolute 0.0 0.0 - 0.1 K/uL   Immature Granulocytes 1 %   Abs  Immature Granulocytes 0.04 0.00 - 0.07 K/uL    Comment: Performed at [facility], [address] [place], [place] [address]    Radiology VAS [place] LOWER EXTREMITY VENOUS (DVT)  Result Date: 11/24/2021  Lower Venous DVT Study Patient Name:  Christina [name]  Date of Exam:   11/23/2021 Medical Rec #: [id number]            Accession #:    [id number] Date of Birth: 1935-07-28            Patient Gender: F Patient Age:   68 years Exam Location:  [facility] Procedure:      VAS [place] LOWER EXTREMITY VENOUS (DVT) Referring Phys: Eulogio Ditch --------------------------------------------------------------------------------  Indications: Swelling, and Hx of CHF and pulmonary edema.  Performing Technologist: Concha Norway RVT  Examination Guidelines: A complete evaluation includes B-mode imaging, spectral Doppler, color Doppler, and power Doppler as needed of all accessible portions of each vessel. Bilateral testing is considered an integral part of a complete examination. Limited examinations for reoccurring indications may be performed as noted. The reflux portion of the exam is performed with the patient in reverse Trendelenburg.    Summary: BILATERAL: - No evidence of deep vein thrombosis seen in the lower extremities, bilaterally. - No evidence of superficial venous thrombosis in the lower extremities, bilaterally. - RIGHT: -  Highly pulsatile venous flow throughout suggestive of pulmonary HTN.  LEFT: - Highly pulsatile venous flow throughout suggestive of pulmonary HTN.  *See table(s) above for measurements and observations. Electronically signed by Leotis Pain MD on 11/24/2021 at 9:01:25 AM.    Final     Assessment/Plan  Swelling of limb To evaluate her swelling, venous study was performed recently in our office.  No evidence of DVT or superficial thrombophlebitis was seen and on reflux evaluation marked volume overload was all that was noted. I have told her from our standpoint, we could  offer Unna boots today to try to help get the swelling under better control acutely but she would like to hold off until she sees her cardiologist next week which is reasonable.  I suspect she would benefit from significant diuresis as long as her kidney function can tolerate this.  If she still has marked swelling we can get her back and in Unna boots if she desires.  Hyperlipidemia lipid control important in reducing the progression of atherosclerotic disease. Continue statin therapy   Stage 3a chronic kidney disease ([place]) Can worsen lower extremity swelling and we have to be careful of hurting her kidney function with diuresis.  Hypertension blood pressure control important in reducing the progression of atherosclerotic disease. On appropriate oral medications.    Leotis Pain, MD  12/05/2021 1:01 PM    This note was created with Dragon medical transcription system.  Any errors from dictation are purely unintentional

## 2021-12-05 NOTE — Assessment & Plan Note (Signed)
To evaluate her swelling, venous study was performed recently in our office.  No evidence of DVT or superficial thrombophlebitis was seen and on reflux evaluation marked volume overload was all that was noted. I have told her from our standpoint, we could offer Unna boots today to try to help get the swelling under better control acutely but she would like to hold off until she sees her cardiologist next week which is reasonable.  I suspect she would benefit from significant diuresis as long as her kidney function can tolerate this.  If she still has marked swelling we can get her back and in Unna boots if she desires.

## 2021-12-05 NOTE — Assessment & Plan Note (Signed)
Can worsen lower extremity swelling and we have to be careful of hurting her kidney function with diuresis.

## 2021-12-06 ENCOUNTER — Ambulatory Visit: Payer: Medicare Other

## 2021-12-06 ENCOUNTER — Other Ambulatory Visit
Admission: RE | Admit: 2021-12-06 | Discharge: 2021-12-06 | Disposition: A | Discharge status: Home / Self Care | Payer: Medicare Other | Source: Ambulatory Visit | Attending: Student | Admitting: Student

## 2021-12-06 DIAGNOSIS — I10 Essential (primary) hypertension: Secondary | ICD-10-CM | POA: Diagnosis not present

## 2021-12-06 DIAGNOSIS — R600 Localized edema: Secondary | ICD-10-CM | POA: Insufficient documentation

## 2021-12-06 DIAGNOSIS — I071 Rheumatic tricuspid insufficiency: Secondary | ICD-10-CM | POA: Diagnosis not present

## 2021-12-06 DIAGNOSIS — E782 Mixed hyperlipidemia: Secondary | ICD-10-CM | POA: Diagnosis not present

## 2021-12-06 DIAGNOSIS — I480 Paroxysmal atrial fibrillation: Secondary | ICD-10-CM | POA: Diagnosis not present

## 2021-12-06 DIAGNOSIS — G4733 Obstructive sleep apnea (adult) (pediatric): Secondary | ICD-10-CM | POA: Diagnosis not present

## 2021-12-06 DIAGNOSIS — Z951 Presence of aortocoronary bypass graft: Secondary | ICD-10-CM | POA: Diagnosis not present

## 2021-12-06 DIAGNOSIS — N1831 Chronic kidney disease, stage 3a: Secondary | ICD-10-CM | POA: Diagnosis not present

## 2021-12-06 DIAGNOSIS — J449 Chronic obstructive pulmonary disease, unspecified: Secondary | ICD-10-CM | POA: Diagnosis not present

## 2021-12-06 DIAGNOSIS — I25118 Atherosclerotic heart disease of native coronary artery with other forms of angina pectoris: Secondary | ICD-10-CM | POA: Diagnosis not present

## 2021-12-06 DIAGNOSIS — I739 Peripheral vascular disease, unspecified: Secondary | ICD-10-CM | POA: Diagnosis not present

## 2021-12-06 LAB — BRAIN NATRIURETIC PEPTIDE: B Natriuretic Peptide: 337.3 pg/mL — ABNORMAL HIGH (ref 0.0–100.0)

## 2021-12-07 ENCOUNTER — Telehealth: Payer: Self-pay

## 2021-12-07 NOTE — Progress Notes (Signed)
    Chronic Care Management Pharmacy Assistant   Name: Christina [name]  MRN: [id number] DOB: 1935/02/17  Reason for Encounter:COPD  Disease State Call.   Recent office visits:  None ID  Recent consult visits:  12/05/2021 Dr. Lucky Cowboy MD (Vascular Surgery) No Medication Changes noted  Hospital visits:  None in previous 6 months  Medications: Outpatient Encounter Medications as of 12/07/2021  Medication Sig   acetaminophen (TYLENOL) 500 MG tablet Take 500 mg by mouth every 8 (eight) hours as needed for mild pain or moderate pain.   albuterol (VENTOLIN HFA) 108 (90 Base) MCG/ACT inhaler Inhale 2 puffs into the lungs every 6 (six) hours as needed for wheezing or shortness of breath.   apixaban (ELIQUIS) 2.5 MG TABS tablet Take by mouth 2 (two) times daily.   Ascorbic Acid (VITAMIN C) 1000 MG tablet Take 1,000 mg by mouth daily.   azelastine (ASTELIN) 0.1 % nasal spray Place 1 spray into both nostrils daily as needed.   Calcium Carb-Cholecalciferol (CALCIUM 600+D3 PO) Take 1 tablet by mouth daily.   carboxymethylcellulose (REFRESH PLUS) 0.5 % SOLN 1 drop 3 (three) times daily as needed.   Coenzyme Q10 (COQ-10) 100 MG capsule Take 1 capsule (100 mg total) by mouth daily.   docusate sodium (COLACE) 100 MG capsule Take 200 mg by mouth at bedtime.   furosemide (LASIX) 20 MG tablet Take 20 mg by mouth daily.   hydrochlorothiazide (HYDRODIURIL) 12.5 MG tablet Take 12.5 mg by mouth daily.   Metoprolol Tartrate 75 MG TABS Take 75 mg by mouth 2 (two) times daily.   Multiple Vitamin (MULTI-VITAMIN DAILY PO) Take 1 tablet by mouth daily.   rosuvastatin (CRESTOR) 20 MG tablet Take 1 tablet (20 mg total) by mouth every evening.   SYMBICORT 160-4.5 MCG/ACT inhaler 1 puff 2 (two) times daily.   Tiotropium Bromide-Olodaterol (STIOLTO RESPIMAT) 2.5-2.5 MCG/ACT AERS Inhale 2 puffs into the lungs in the morning and at bedtime.   tobramycin (TOBREX) 0.3 % ophthalmic solution    No facility-administered  encounter medications on file as of 12/07/2021.   Care Gaps: Shingrix Vaccine Dexa Scan COVID-19 Vaccine Medicare Wellness visit   Star Rating Drugs: Rosuvastatin 20 mg last filled 10/08/2021 [facility]   Medication Fill Gaps: None ID  Current COPD regimen:  Ventolin HFA 2 puffs every 6 hours as needed Stiolto Respimat 2.5-2.5 MCG/ACT inhale 2 puffs in the morning and at bedtime.     No data to display         Any recent hospitalizations or ED visits since last visit with CPP? No   What recent interventions/DTPs have been made by any provider to improve breathing since last visit:None ID   Adherence Review: Does the patient have >5 day gap between last estimated fill date for maintenance inhaler medications? No   I have attempted without success to contact this patient by phone three times to do her COPD Disease State call. I left a Voice message for patient to return my call.   Ali Chuk Pharmacist Assistant [phone]

## 2021-12-15 ENCOUNTER — Ambulatory Visit (INDEPENDENT_AMBULATORY_CARE_PROVIDER_SITE_OTHER): Payer: Medicare Other | Admitting: Family Medicine

## 2021-12-15 ENCOUNTER — Encounter: Payer: Self-pay | Admitting: Family Medicine

## 2021-12-15 VITALS — BP 150/57 | HR 76 | Resp 16 | Ht 62.0 in | Wt 152.0 lb

## 2021-12-15 DIAGNOSIS — L03116 Cellulitis of left lower limb: Secondary | ICD-10-CM | POA: Insufficient documentation

## 2021-12-15 DIAGNOSIS — I739 Peripheral vascular disease, unspecified: Secondary | ICD-10-CM | POA: Diagnosis not present

## 2021-12-15 DIAGNOSIS — I10 Essential (primary) hypertension: Secondary | ICD-10-CM

## 2021-12-15 MED ORDER — DOXYCYCLINE HYCLATE 100 MG PO TABS: 100.0000 mg | ORAL_TABLET | Freq: Two times a day (BID) | ORAL | 0 refills | Status: DC

## 2021-12-15 MED ORDER — AQUAPHOR EX OINT: TOPICAL_OINTMENT | CUTANEOUS | 0 refills | Status: DC | PRN

## 2021-12-15 NOTE — Assessment & Plan Note (Signed)
Chronic, stable Continue Eliquis 2.5 mg BID Continue Crestor 20 mg qHS Previously on ASA 81; which has since been held

## 2021-12-15 NOTE — Assessment & Plan Note (Signed)
Acute, stable Lower L extremity Reports that her leg "has a fever" Denies discharge or blistering Denies numbness or tingling Edema remains NP/+1; however, improved from increase of diuretic at cardiology visit with [facility] Has been using TED stocking to assist Leg is painful, erythema present Low risk for Blood clot given continued use of Eliquis for Afib with report of no missed doses Previously seen by vascular given complex history of PAD and hx of claudication

## 2021-12-15 NOTE — Assessment & Plan Note (Signed)
Chronic, elevated Goal 140/<90 Previously controlled on Lasix 20 and metop 75 BID as well as HCTZ  Likely elevated from pain in LLE

## 2021-12-15 NOTE — Progress Notes (Signed)
Established patient visit   Patient: Christina [name]   DOB: 05-Sep-1935   86 y.o. Female  MRN: [id number] Visit Date: 12/15/2021  Today's healthcare provider: [name], FNP  Re Introduced to nurse practitioner role and practice setting.  All questions answered.  Discussed provider/patient relationship and expectations.   I,Tiffany J Bragg,acting as a scribe for [name], FNP.,have documented all relevant documentation on the behalf of [name], FNP,as directed by  [name], FNP while in the presence of [name], FNP.   Chief Complaint  Patient presents with   Leg Swelling    Patient complains of bilateral leg swelling and 10/10 pain. States her cardiologist started her on furosemide, swelling went down but pain continues.    Subjective    HPI HPI     Leg Swelling    Additional comments: Patient complains of bilateral leg swelling and 10/10 pain. States her cardiologist started her on furosemide, swelling went down but pain continues.       Last edited by Smitty Knudsen, CMA on 12/15/2021 11:06 AM.       Medications: Outpatient Medications Prior to Visit  Medication Sig   acetaminophen (TYLENOL) 500 MG tablet Take 500 mg by mouth every 8 (eight) hours as needed for mild pain or moderate pain.   albuterol (VENTOLIN HFA) 108 (90 Base) MCG/ACT inhaler Inhale 2 puffs into the lungs every 6 (six) hours as needed for wheezing or shortness of breath.   apixaban (ELIQUIS) 2.5 MG TABS tablet Take by mouth 2 (two) times daily.   Ascorbic Acid (VITAMIN C) 1000 MG tablet Take 1,000 mg by mouth daily.   azelastine (ASTELIN) 0.1 % nasal spray Place 1 spray into both nostrils daily as needed.   Calcium Carb-Cholecalciferol (CALCIUM 600+D3 PO) Take 1 tablet by mouth daily.   carboxymethylcellulose (REFRESH PLUS) 0.5 % SOLN 1 drop 3 (three) times daily as needed.   Coenzyme Q10 (COQ-10) 100 MG capsule Take 1 capsule (100 mg total) by mouth daily.    docusate sodium (COLACE) 100 MG capsule Take 200 mg by mouth at bedtime.   furosemide (LASIX) 20 MG tablet Take 20 mg by mouth daily.   Metoprolol Tartrate 75 MG TABS Take 75 mg by mouth 2 (two) times daily.   Multiple Vitamin (MULTI-VITAMIN DAILY PO) Take 1 tablet by mouth daily.   rosuvastatin (CRESTOR) 20 MG tablet Take 1 tablet (20 mg total) by mouth every evening.   SYMBICORT 160-4.5 MCG/ACT inhaler 1 puff 2 (two) times daily.   Tiotropium Bromide-Olodaterol (STIOLTO RESPIMAT) 2.5-2.5 MCG/ACT AERS Inhale 2 puffs into the lungs in the morning and at bedtime.   tobramycin (TOBREX) 0.3 % ophthalmic solution    hydrochlorothiazide (HYDRODIURIL) 12.5 MG tablet Take 12.5 mg by mouth daily.   No facility-administered medications prior to visit.    Review of Systems   Objective    BP (!) 150/57 (BP Location: Right Arm, Patient Position: Sitting, Cuff Size: Normal)   Pulse 76   Resp 16   Ht '5\' 2"'$  (1.575 m)   Wt 152 lb (68.9 kg)   SpO2 97%   BMI 27.80 kg/m   Physical Exam Vitals and nursing note reviewed.  Constitutional:      General: She is not in acute distress.    Appearance: Normal appearance. She is overweight. She is not ill-appearing, toxic-appearing or diaphoretic.  HENT:     Head: Normocephalic and atraumatic.  Cardiovascular:  Rate and Rhythm: Normal rate and regular rhythm.     Pulses: Normal pulses.     Heart sounds: Normal heart sounds. No murmur heard.    No friction rub. No gallop.  Pulmonary:     Effort: Pulmonary effort is normal. No respiratory distress.     Breath sounds: Normal breath sounds. No stridor. No wheezing, rhonchi or rales.  Chest:     Chest wall: No tenderness.  Abdominal:     General: Bowel sounds are normal.     Palpations: Abdomen is soft.  Musculoskeletal:        General: No swelling, tenderness, deformity or signs of injury. Normal range of motion.     Right lower leg: Edema present.     Left lower leg: Edema present.  Skin:     General: Skin is warm and dry.     Capillary Refill: Capillary refill takes less than 2 seconds.     Coloration: Skin is not jaundiced or pale.     Findings: Erythema present. No bruising, lesion or rash.       Neurological:     General: No focal deficit present.     Mental Status: She is alert and oriented to person, place, and time. Mental status is at baseline.     Cranial Nerves: No cranial nerve deficit.     Sensory: No sensory deficit.     Motor: No weakness.     Coordination: Coordination normal.  Psychiatric:        Mood and Affect: Mood normal.        Behavior: Behavior normal.        Thought Content: Thought content normal.        Judgment: Judgment normal.      No results found for any visits on 12/15/21.  Assessment & Plan     Problem List Items Addressed This Visit       Cardiovascular and Mediastinum   Hypertension    Chronic, elevated Goal 140/<90 Previously controlled on Lasix 20 and metop 75 BID as well as HCTZ  Likely elevated from pain in LLE      PAD (peripheral artery disease) (HCC)    Chronic, stable Continue Eliquis 2.5 mg BID Continue Crestor 20 mg qHS Previously on ASA 81; which has since been held         Other   Cellulitis of left lower extremity - Primary    Acute, stable Lower L extremity Reports that her leg "has a fever" Denies discharge or blistering Denies numbness or tingling Edema remains NP/+1; however, improved from increase of diuretic at cardiology visit with [facility] Has been using TED stocking to assist Leg is painful, erythema present Low risk for Blood clot given continued use of Eliquis for Afib with report of no missed doses Previously seen by vascular given complex history of PAD and hx of claudication      Relevant Medications   doxycycline (VIBRA-TABS) 100 MG tablet   mineral oil-hydrophilic petrolatum (AQUAPHOR) ointment     Return today (on 12/15/2021) for cellulitis progression .      Vonna Kotyk,  FNP, have reviewed all documentation for this visit. The documentation on 12/15/21 for the exam, diagnosis, procedures, and orders are all accurate and complete.    [name], [place] [phone] (phone) [phone] (fax)  [place]

## 2021-12-18 NOTE — Progress Notes (Signed)
I,April Miller,acting as a scribe for [name], FNP.,have documented all relevant documentation on the behalf of [name], FNP,as directed by  [name], FNP while in the presence of [name], FNP. Established patient visit  Patient: Christina [name]   DOB: 25-Jan-1936   86 y.o. Female  MRN: [id number] Visit Date: 12/22/2021  Today's healthcare provider: [name], FNP  Re Introduced to nurse practitioner role and practice setting.  All questions answered.  Discussed provider/patient relationship and expectations.  Chief Complaint  Patient presents with   Follow-up   Cellulitis   Subjective    HPI  Follow up for cellulitis  The patient was last seen for this 1 weeks ago. Changes made at last visit include take doxy '100mg'$ .  She reports good compliance with treatment. She feels that condition is Improved. She is not having side effects. none  -----------------------------------------------------------------------------------------   Patient states cellulitis is somewhat better. However, her legs are still burn. The left leg is the worst.  Medications: Outpatient Medications Prior to Visit  Medication Sig   acetaminophen (TYLENOL) 500 MG tablet Take 500 mg by mouth every 8 (eight) hours as needed for mild pain or moderate pain.   albuterol (VENTOLIN HFA) 108 (90 Base) MCG/ACT inhaler Inhale 2 puffs into the lungs every 6 (six) hours as needed for wheezing or shortness of breath.   apixaban (ELIQUIS) 2.5 MG TABS tablet Take by mouth 2 (two) times daily.   Ascorbic Acid (VITAMIN C) 1000 MG tablet Take 1,000 mg by mouth daily.   azelastine (ASTELIN) 0.1 % nasal spray Place 1 spray into both nostrils daily as needed.   Calcium Carb-Cholecalciferol (CALCIUM 600+D3 PO) Take 1 tablet by mouth daily.   carboxymethylcellulose (REFRESH PLUS) 0.5 % SOLN 1 drop 3 (three) times daily as needed.   Coenzyme Q10 (COQ-10) 100 MG capsule Take 1 capsule (100 mg total) by  mouth daily.   docusate sodium (COLACE) 100 MG capsule Take 200 mg by mouth at bedtime.   furosemide (LASIX) 20 MG tablet Take 20 mg by mouth daily.   Metoprolol Tartrate 75 MG TABS Take 75 mg by mouth 2 (two) times daily.   mineral oil-hydrophilic petrolatum (AQUAPHOR) ointment Apply topically as needed for dry skin.   Multiple Vitamin (MULTI-VITAMIN DAILY PO) Take 1 tablet by mouth daily.   rosuvastatin (CRESTOR) 20 MG tablet Take 1 tablet (20 mg total) by mouth every evening.   SYMBICORT 160-4.5 MCG/ACT inhaler 1 puff 2 (two) times daily.   Tiotropium Bromide-Olodaterol (STIOLTO RESPIMAT) 2.5-2.5 MCG/ACT AERS Inhale 2 puffs into the lungs in the morning and at bedtime.   tobramycin (TOBREX) 0.3 % ophthalmic solution    [DISCONTINUED] doxycycline (VIBRA-TABS) 100 MG tablet Take 1 tablet (100 mg total) by mouth 2 (two) times daily.   [DISCONTINUED] hydrochlorothiazide (HYDRODIURIL) 12.5 MG tablet Take 12.5 mg by mouth daily.   No facility-administered medications prior to visit.    Review of Systems  Constitutional:  Negative for appetite change, chills, fatigue and fever.  Respiratory:  Negative for chest tightness and shortness of breath.   Cardiovascular:  Negative for chest pain and palpitations.  Gastrointestinal:  Negative for abdominal pain, nausea and vomiting.  Neurological:  Negative for dizziness and weakness.     Objective    BP (!) 143/81 (BP Location: Left Arm, Patient Position: Sitting, Cuff Size: Normal)   Pulse 62   Temp 98.1 F (36.7 C) (Temporal)   Resp 16  Wt 150 lb (68 kg)   SpO2 97%   BMI 27.44 kg/m   Physical Exam Vitals and nursing note reviewed.  Constitutional:      General: She is not in acute distress.    Appearance: Normal appearance. She is overweight. She is not ill-appearing, toxic-appearing or diaphoretic.  HENT:     Head: Normocephalic and atraumatic.  Cardiovascular:     Rate and Rhythm: Normal rate and regular rhythm.     Pulses:  Normal pulses.     Heart sounds: Normal heart sounds. No murmur heard.    No friction rub. No gallop.  Pulmonary:     Effort: Pulmonary effort is normal. No respiratory distress.     Breath sounds: Normal breath sounds. No stridor. No wheezing, rhonchi or rales.  Chest:     Chest wall: No tenderness.  Abdominal:     General: Bowel sounds are normal.     Palpations: Abdomen is soft.  Musculoskeletal:        General: No swelling, tenderness, deformity or signs of injury. Normal range of motion.     Right lower leg: Edema present.     Left lower leg: Edema present.  Skin:    General: Skin is warm and dry.     Capillary Refill: Capillary refill takes less than 2 seconds.     Coloration: Skin is not jaundiced or pale.     Findings: Erythema present. No bruising, lesion or rash.       Neurological:     General: No focal deficit present.     Mental Status: She is alert and oriented to person, place, and time. Mental status is at baseline.     Cranial Nerves: No cranial nerve deficit.     Sensory: No sensory deficit.     Motor: No weakness.     Coordination: Coordination normal.  Psychiatric:        Mood and Affect: Mood normal.        Behavior: Behavior normal.        Thought Content: Thought content normal.        Judgment: Judgment normal.     No results found for any visits on 12/22/21.  Assessment & Plan     Problem List Items Addressed This Visit       Cardiovascular and Mediastinum   PAD (peripheral artery disease) ([place]) - Primary    Chronic, seen by Dr Apolonio Schneiders to use compression hose Appears to be out of HCTZ 12.5; will change to 25 given chronic BP elevations and continued LE edema       Relevant Medications   hydrochlorothiazide (HYDRODIURIL) 25 MG tablet     Other   Cellulitis of left lower extremity    Chronic, improving Has 6.5 days in of 10; request refill for next week if still remaining      Relevant Medications   doxycycline (VIBRA-TABS) 100  MG tablet   Return if symptoms worsen or fail to improve.     Vonna Kotyk, FNP, have reviewed all documentation for this visit. The documentation on 12/22/21 for the exam, diagnosis, procedures, and orders are all accurate and complete.  [name], [place] [phone] (phone) [phone] (fax)  [place]

## 2021-12-20 ENCOUNTER — Ambulatory Visit (INDEPENDENT_AMBULATORY_CARE_PROVIDER_SITE_OTHER): Payer: Medicare Other | Admitting: Surgery

## 2021-12-20 ENCOUNTER — Encounter: Payer: Self-pay | Admitting: Surgery

## 2021-12-20 ENCOUNTER — Ambulatory Visit (INDEPENDENT_AMBULATORY_CARE_PROVIDER_SITE_OTHER): Payer: Medicare Other

## 2021-12-20 ENCOUNTER — Ambulatory Visit: Payer: Medicare Other

## 2021-12-20 VITALS — BP 152/81 | HR 91 | Temp 97.7°F | Wt 153.6 lb

## 2021-12-20 DIAGNOSIS — R0789 Other chest pain: Secondary | ICD-10-CM | POA: Diagnosis not present

## 2021-12-20 DIAGNOSIS — G4733 Obstructive sleep apnea (adult) (pediatric): Secondary | ICD-10-CM | POA: Diagnosis not present

## 2021-12-20 DIAGNOSIS — R1012 Left upper quadrant pain: Secondary | ICD-10-CM

## 2021-12-20 DIAGNOSIS — R1032 Left lower quadrant pain: Secondary | ICD-10-CM

## 2021-12-20 NOTE — Progress Notes (Signed)
95 percentile pressure 12   95th percentile leak 57.7   apnea index 2.1 /hr  apnea-hypopnea index  3.2 /hr   total days used  >4 hr 85 days  total days used <4 hr 3 days  Total compliance 94 percent  She is doing great no problems and question Pt was seen by Claiborne Billings  RRT/RCP  from [employer]

## 2021-12-20 NOTE — Progress Notes (Signed)
Outpatient Surgical Follow Up  12/20/2021  Christina [name] is an 86 y.o. female.   Chief Complaint  Patient presents with  . Follow-up    Paraesophageal hernia repair 09/05/21    HPI: Christina [name] is 3  1/2 month after giant paraesophageal hernia repair. She has some volume and lower extremity edema issues.Her weight has not changed when compared to her weight preop.   Past Medical History:  Diagnosis Date  . Adenomatous colon polyp   . Anemia   . Aortic atherosclerosis ([place])   . Arthritis   . Barrett's esophagus   . Bilateral carotid artery disease ([place])    a.) carotid doppler 07/18/2020: 0-94% RICA, 70-96% LICA; b.) carotid doppler 28/36/6294: 76-54% LICA, no sig RICA; c.) carotid doppler 01/23/2021: 6-50% RICA, 35-46% LICA; d.) carotid doppler 56/81/2751: 70-01% LICA, near norm RICA  . CKD (chronic kidney disease), stage III ([place])   . COPD (chronic obstructive pulmonary disease) ([place])   . Coronary artery disease 04/21/2004   a.) LHC 04/21/2004 [facility]): EF 66%, 50% dLCx, 75% pLCx, 75% oRCA, 50% pRCA --> transferred to [facility] for CVTS consult; b.) 3v CABG  . Dyspnea on exertion   . GERD (gastroesophageal reflux disease)   . Hiatal hernia   . Hyperlipidemia   . Hypertension   . Long term current use of anticoagulant    a.) reduced dose apixaban  . Macular degeneration   . OSA on CPAP   . PAD (peripheral artery disease) ([place]) 12/10/2016   a.) PTA of BILATERAL common iliac arteries --> 7 x 26 mm stents placed  . PAF (paroxysmal atrial fibrillation) (HCC)    a.) CHA2DS2-VASc = 6 (age x 2, sex, HTN, vascular disease, T2DM);  b.) rate/rhythm maintained on oral metoprolol tartrate; chronically anticoagulated using dose reduced apixaban  . Peripheral edema   . PSVT (paroxysmal supraventricular tachycardia)   . S/P CABG x 3 04/2004  . Schatzki's ring   . Scoliosis of thoracolumbar spine   . Valvular heart disease    a.) TTE 04/20/2004: EF >55%, mild MR; b.)  TTE 04/10/2021: EF  50-55%, LVH, reduced RV SF, RVE, moderate BAE, mild-mod MR, severe TR.    Past Surgical History:  Procedure Laterality Date  . ABDOMINAL HYSTERECTOMY  1980   due to dysfunctional uterine bleeding  . APPENDECTOMY  1980  . BREAST SURGERY Left 2000   biopsy  . CARDIAC CATHETERIZATION    . COLONOSCOPY    . CORONARY ARTERY BYPASS GRAFT  2006  . ESOPHAGOGASTRODUODENOSCOPY (EGD) WITH PROPOFOL N/A 06/08/2021   Procedure: ESOPHAGOGASTRODUODENOSCOPY (EGD) WITH PROPOFOL;  Surgeon: [name], MD;  Location: [facility] ENDOSCOPY;  Service: Gastroenterology;  Laterality: N/A;  . HEMORRHOID SURGERY    . INSERTION OF MESH  09/05/2021   Procedure: INSERTION OF MESH;  Surgeon: Jules Husbands, MD;  Location: [facility] ORS;  Service: General;;  . LOWER EXTREMITY ANGIOGRAPHY Right 02/25/2017   Procedure: LOWER EXTREMITY ANGIOGRAPHY;  Surgeon: Algernon Huxley, MD;  Location: [place] CV LAB;  Service: Cardiovascular;  Laterality: Right;  . LOWER EXTREMITY INTERVENTION  02/25/2017   Procedure: LOWER EXTREMITY INTERVENTION;  Surgeon: Algernon Huxley, MD;  Location: [place] CV LAB;  Service: Cardiovascular;;  . LUNG BIOPSY  1999   Negative  . TONSILLECTOMY    . XI ROBOTIC ASSISTED PARAESOPHAGEAL HERNIA REPAIR N/A 09/05/2021   Procedure: XI ROBOTIC ASSISTED PARAESOPHAGEAL HERNIA REPAIR, RNFA to assist;  Surgeon: Jules Husbands, MD;  Location: [facility] ORS;  Service: General;  Laterality:  N/A;    Family History  Problem Relation Age of Onset  . Hypertension Mother   . Hyperlipidemia Mother   . Alzheimer's disease Mother   . CAD Mother   . Heart attack Father   . Lung disease Sister   . Heart disease Sister   . Diabetes Paternal Grandfather        Type 2  . Hearing loss Son     Social History:  reports that she quit smoking about 17 years ago. Her smoking use included cigarettes. She has a 2.50 pack-year smoking history. She has been exposed to tobacco smoke. She has never used smokeless tobacco. She reports that she  does not drink alcohol and does not use drugs.  Allergies:  Allergies  Allergen Reactions  . Baclofen Other (See Comments)  . Codeine Nausea Only  . Plavix [Clopidogrel]     brusing all over  . Sulfa Antibiotics Nausea Only  . Latex Rash    Medications reviewed.    ROS Full ROS performed and is otherwise negative other than what is stated in HPI   BP (!) 152/81   Pulse 91   Temp 97.7 F (36.5 C) (Oral)   Wt 153 lb 9.6 oz (69.7 kg)   SpO2 96%   BMI 28.09 kg/m   Physical Exam Vitals and nursing note reviewed. Exam conducted with a chaperone present.  Constitutional:      General: She is not in acute distress.    Appearance: Normal appearance. She is normal weight.  Pulmonary:     Effort: Pulmonary effort is normal. No respiratory distress.     Breath sounds: Normal breath sounds. No stridor.  Abdominal:     General: Abdomen is flat. There is no distension.     Palpations: Abdomen is soft. There is no mass.     Tenderness: There is no abdominal tenderness. There is no guarding or rebound.     Hernia: No hernia is present.  Musculoskeletal:     Cervical back: Normal range of motion and neck supple. No rigidity or tenderness.  Neurological:     General: No focal deficit present.     Mental Status: She is alert and oriented to person, place, and time.  Psychiatric:        Mood and Affect: Mood normal.        Behavior: Behavior normal.        Thought Content: Thought content normal.        Judgment: Judgment normal.    Assessment/Plan:  I spent 40 minutes in this encounter including personally reviewing imaging studies, coordinating patient's care, placing orders and performing appropriate documentation   Caroleen Hamman, MD [facility] General Surgeon

## 2021-12-20 NOTE — Patient Instructions (Addendum)
Your CT is scheduled for 12/27/2021 at 12:30 pm (arrive at 12:15 am) @ [facility] on [address]. Nothing to eat or drink 4 hours prior to scan.   If you have any concerns or questions, please feel free to call our office.   Abdominal Pain, Adult Many things can cause belly (abdominal) pain. Most times, belly pain is not dangerous. Many cases of belly pain can be watched and treated at home. Sometimes, though, belly pain is serious. Your doctor will try to find the cause of your belly pain. Follow these instructions at home:  Medicines Take over-the-counter and prescription medicines only as told by your doctor. Do not take medicines that help you poop (laxatives) unless told by your doctor. General instructions Watch your belly pain for any changes. Drink enough fluid to keep your pee (urine) pale yellow. Keep all follow-up visits as told by your doctor. This is important. Contact a doctor if: Your belly pain changes or gets worse. You are not hungry, or you lose weight without trying. You are having trouble pooping (constipated) or have watery poop (diarrhea) for more than 2-3 days. You have pain when you pee or poop. Your belly pain wakes you up at night. Your pain gets worse with meals, after eating, or with certain foods. You are vomiting and cannot keep anything down. You have a fever. You have blood in your pee. Get help right away if: Your pain does not go away as soon as your doctor says it should. You cannot stop vomiting. Your pain is only in areas of your belly, such as the right side or the left lower part of the belly. You have bloody or black poop, or poop that looks like tar. You have very bad pain, cramping, or bloating in your belly. You have signs of not having enough fluid or water in your body (dehydration), such as: Dark pee, very little pee, or no pee. Cracked lips. Dry mouth. Sunken eyes. Sleepiness. Weakness. You have trouble breathing or  chest pain. Summary Many cases of belly pain can be watched and treated at home. Watch your belly pain for any changes. Take over-the-counter and prescription medicines only as told by your doctor. Contact a doctor if your belly pain changes or gets worse. Get help right away if you have very bad pain, cramping, or bloating in your belly. This information is not intended to replace advice given to you by your health care provider. Make sure you discuss any questions you have with your health care provider. Document Revised: 06/02/2018 Document Reviewed: 06/02/2018 Elsevier Patient Education  [place].

## 2021-12-22 ENCOUNTER — Ambulatory Visit (INDEPENDENT_AMBULATORY_CARE_PROVIDER_SITE_OTHER): Payer: Medicare Other | Admitting: Family Medicine

## 2021-12-22 ENCOUNTER — Encounter: Payer: Self-pay | Admitting: Family Medicine

## 2021-12-22 VITALS — BP 143/81 | HR 62 | Temp 98.1°F | Resp 16 | Wt 150.0 lb

## 2021-12-22 DIAGNOSIS — L03116 Cellulitis of left lower limb: Secondary | ICD-10-CM | POA: Diagnosis not present

## 2021-12-22 DIAGNOSIS — I739 Peripheral vascular disease, unspecified: Secondary | ICD-10-CM

## 2021-12-22 MED ORDER — DOXYCYCLINE HYCLATE 100 MG PO TABS: 100.0000 mg | ORAL_TABLET | Freq: Two times a day (BID) | ORAL | 0 refills | Status: DC

## 2021-12-22 MED ORDER — HYDROCHLOROTHIAZIDE 25 MG PO TABS: 25.0000 mg | ORAL_TABLET | Freq: Every day | ORAL | 3 refills | Status: DC

## 2021-12-22 NOTE — Assessment & Plan Note (Signed)
Chronic, seen by Dr Apolonio Schneiders to use compression hose Appears to be out of HCTZ 12.5; will change to 25 given chronic BP elevations and continued LE edema

## 2021-12-22 NOTE — Assessment & Plan Note (Signed)
Chronic, improving Has 6.5 days in of 10; request refill for next week if still remaining

## 2021-12-24 ENCOUNTER — Other Ambulatory Visit: Payer: Self-pay | Admitting: Family Medicine

## 2021-12-24 DIAGNOSIS — I10 Essential (primary) hypertension: Secondary | ICD-10-CM

## 2021-12-27 ENCOUNTER — Ambulatory Visit
Admission: RE | Admit: 2021-12-27 | Discharge: 2021-12-27 | Disposition: A | Discharge status: Home / Self Care | Payer: Medicare Other | Source: Ambulatory Visit | Attending: Surgery | Admitting: Surgery

## 2021-12-27 DIAGNOSIS — K5730 Diverticulosis of large intestine without perforation or abscess without bleeding: Secondary | ICD-10-CM | POA: Diagnosis not present

## 2021-12-27 DIAGNOSIS — K760 Fatty (change of) liver, not elsewhere classified: Secondary | ICD-10-CM | POA: Diagnosis not present

## 2021-12-27 DIAGNOSIS — R1032 Left lower quadrant pain: Secondary | ICD-10-CM | POA: Diagnosis not present

## 2021-12-27 DIAGNOSIS — N281 Cyst of kidney, acquired: Secondary | ICD-10-CM | POA: Diagnosis not present

## 2021-12-27 MED ORDER — IOHEXOL 300 MG/ML  SOLN
100.0000 mL | Freq: Once | INTRAMUSCULAR | Status: AC | PRN
  Administered 2021-12-27: 100 mL via INTRAVENOUS

## 2022-01-01 ENCOUNTER — Encounter: Payer: Medicare Other | Admitting: Surgery

## 2022-01-01 ENCOUNTER — Ambulatory Visit (INDEPENDENT_AMBULATORY_CARE_PROVIDER_SITE_OTHER): Payer: Medicare Other | Admitting: Surgery

## 2022-01-01 ENCOUNTER — Encounter: Payer: Self-pay | Admitting: Surgery

## 2022-01-01 VITALS — BP 131/59 | HR 73 | Temp 98.0°F | Wt 152.4 lb

## 2022-01-01 DIAGNOSIS — K449 Diaphragmatic hernia without obstruction or gangrene: Secondary | ICD-10-CM | POA: Diagnosis not present

## 2022-01-01 DIAGNOSIS — Z09 Encounter for follow-up examination after completed treatment for conditions other than malignant neoplasm: Secondary | ICD-10-CM

## 2022-01-01 NOTE — Patient Instructions (Signed)
If you have any concerns or questions, please feel free to call our office. Follow up as needed.   Abdominal Pain, Adult Many things can cause belly (abdominal) pain. Most times, belly pain is not dangerous. Many cases of belly pain can be watched and treated at home. Sometimes, though, belly pain is serious. Your doctor will try to find the cause of your belly pain. Follow these instructions at home:  Medicines Take over-the-counter and prescription medicines only as told by your doctor. Do not take medicines that help you poop (laxatives) unless told by your doctor. General instructions Watch your belly pain for any changes. Drink enough fluid to keep your pee (urine) pale yellow. Keep all follow-up visits as told by your doctor. This is important. Contact a doctor if: Your belly pain changes or gets worse. You are not hungry, or you lose weight without trying. You are having trouble pooping (constipated) or have watery poop (diarrhea) for more than 2-3 days. You have pain when you pee or poop. Your belly pain wakes you up at night. Your pain gets worse with meals, after eating, or with certain foods. You are vomiting and cannot keep anything down. You have a fever. You have blood in your pee. Get help right away if: Your pain does not go away as soon as your doctor says it should. You cannot stop vomiting. Your pain is only in areas of your belly, such as the right side or the left lower part of the belly. You have bloody or black poop, or poop that looks like tar. You have very bad pain, cramping, or bloating in your belly. You have signs of not having enough fluid or water in your body (dehydration), such as: Dark pee, very little pee, or no pee. Cracked lips. Dry mouth. Sunken eyes. Sleepiness. Weakness. You have trouble breathing or chest pain. Summary Many cases of belly pain can be watched and treated at home. Watch your belly pain for any changes. Take  over-the-counter and prescription medicines only as told by your doctor. Contact a doctor if your belly pain changes or gets worse. Get help right away if you have very bad pain, cramping, or bloating in your belly. This information is not intended to replace advice given to you by your health care provider. Make sure you discuss any questions you have with your health care provider. Document Revised: 06/02/2018 Document Reviewed: 06/02/2018 Elsevier Patient Education  [place].

## 2022-01-03 ENCOUNTER — Encounter: Payer: Self-pay | Admitting: Surgery

## 2022-01-03 ENCOUNTER — Ambulatory Visit: Payer: Medicare Other | Admitting: Surgery

## 2022-01-03 DIAGNOSIS — K219 Gastro-esophageal reflux disease without esophagitis: Secondary | ICD-10-CM | POA: Diagnosis not present

## 2022-01-03 DIAGNOSIS — J449 Chronic obstructive pulmonary disease, unspecified: Secondary | ICD-10-CM | POA: Diagnosis not present

## 2022-01-03 NOTE — Progress Notes (Signed)
Outpatient Surgical Follow Up  01/03/2022  Christina [name] is an 86 y.o. female.   Chief Complaint  Patient presents with   Follow-up    Paraesophageal hernia repair 09/05/21    HPI: Christina [name] is close to 4 months out  after giant paraesophageal hernia repair.  She feels better, she did have some abdominal discomofrt and chest wall pain. I did perform CT A/P that I have personally reviewed, , hernia is completely repair, no evidence of complications  He abdominal and chest wall complaints had dramatically improved.  From her abdominal perspective she is doing well.  She is eating well having bowel movements.    Of note she did have a prior left thoracoscopic surgery and an open heart surgery in the past so this certainly can be related and explain by prior scar tissue thoracotomy syndrome.  Past Medical History:  Diagnosis Date   Adenomatous colon polyp    Anemia    Aortic atherosclerosis (HCC)    Arthritis    Barrett's esophagus    Bilateral carotid artery disease ([place])    a.) carotid doppler 07/18/2020: 1-75% RICA, 10-25% LICA; b.) carotid doppler 85/27/7824: 23-53% LICA, no sig RICA; c.) carotid doppler 01/23/2021: 6-14% RICA, 43-15% LICA; d.) carotid doppler 40/09/6759: 95-09% LICA, near norm RICA   CKD (chronic kidney disease), stage III (HCC)    COPD (chronic obstructive pulmonary disease) ([place])    Coronary artery disease 04/21/2004   a.) LHC 04/21/2004 [facility]): EF 66%, 50% dLCx, 75% pLCx, 75% oRCA, 50% pRCA --> transferred to [facility] for CVTS consult; b.) 3v CABG   Dyspnea on exertion    GERD (gastroesophageal reflux disease)    Hiatal hernia    Hyperlipidemia    Hypertension    Long term current use of anticoagulant    a.) reduced dose apixaban   Macular degeneration    OSA on CPAP    PAD (peripheral artery disease) ([place]) 12/10/2016   a.) PTA of BILATERAL common iliac arteries --> 7 x 26 mm stents placed   PAF (paroxysmal atrial fibrillation) (HCC)    a.)  CHA2DS2-VASc = 6 (age x 2, sex, HTN, vascular disease, T2DM);  b.) rate/rhythm maintained on oral metoprolol tartrate; chronically anticoagulated using dose reduced apixaban   Peripheral edema    PSVT (paroxysmal supraventricular tachycardia)    S/P CABG x 3 04/2004   Schatzki's ring    Scoliosis of thoracolumbar spine    Valvular heart disease    a.) TTE 04/20/2004: EF >55%, mild MR; b.)  TTE 04/10/2021: EF 50-55%, LVH, reduced RV SF, RVE, moderate BAE, mild-mod MR, severe TR.    Past Surgical History:  Procedure Laterality Date   ABDOMINAL HYSTERECTOMY  1980   due to dysfunctional uterine bleeding   APPENDECTOMY  1980   BREAST SURGERY Left 2000   biopsy   CARDIAC CATHETERIZATION     COLONOSCOPY     CORONARY ARTERY BYPASS GRAFT  2006   ESOPHAGOGASTRODUODENOSCOPY (EGD) WITH PROPOFOL N/A 06/08/2021   Procedure: ESOPHAGOGASTRODUODENOSCOPY (EGD) WITH PROPOFOL;  Surgeon: [name], MD;  Location: [facility] ENDOSCOPY;  Service: Gastroenterology;  Laterality: N/A;   HEMORRHOID SURGERY     INSERTION OF MESH  09/05/2021   Procedure: INSERTION OF MESH;  Surgeon: Jules Husbands, MD;  Location: [facility] ORS;  Service: General;;   LOWER EXTREMITY ANGIOGRAPHY Right 02/25/2017   Procedure: LOWER EXTREMITY ANGIOGRAPHY;  Surgeon: Algernon Huxley, MD;  Location: [place] CV LAB;  Service: Cardiovascular;  Laterality: Right;  LOWER EXTREMITY INTERVENTION  02/25/2017   Procedure: LOWER EXTREMITY INTERVENTION;  Surgeon: Algernon Huxley, MD;  Location: [place] CV LAB;  Service: Cardiovascular;;   LUNG BIOPSY  1999   Negative   TONSILLECTOMY     XI ROBOTIC ASSISTED PARAESOPHAGEAL HERNIA REPAIR N/A 09/05/2021   Procedure: XI ROBOTIC ASSISTED PARAESOPHAGEAL HERNIA REPAIR, RNFA to assist;  Surgeon: Jules Husbands, MD;  Location: [facility] ORS;  Service: General;  Laterality: N/A;    Family History  Problem Relation Age of Onset   Hypertension Mother    Hyperlipidemia Mother    Alzheimer's disease Mother    CAD  Mother    Heart attack Father    Lung disease Sister    Heart disease Sister    Diabetes Paternal Grandfather        Type 2   Hearing loss Son     Social History:  reports that she quit smoking about 17 years ago. Her smoking use included cigarettes. She has a 2.50 pack-year smoking history. She has been exposed to tobacco smoke. She has never used smokeless tobacco. She reports that she does not drink alcohol and does not use drugs.  Allergies:  Allergies  Allergen Reactions   Baclofen Other (See Comments)   Codeine Nausea Only   Plavix [Clopidogrel]     brusing all over   Sulfa Antibiotics Nausea Only   Latex Rash    Medications reviewed.    ROS Full ROS performed and is otherwise negative other than what is stated in HPI   BP (!) 131/59   Pulse 73   Temp 98 F (36.7 C) (Oral)   Wt 152 lb 6.4 oz (69.1 kg)   SpO2 96%   BMI 27.87 kg/m   Physical Exam Vitals and nursing note reviewed. Exam conducted with a chaperone present.  Constitutional:      Appearance: Normal appearance.  Cardiovascular:     Rate and Rhythm: Normal rate and regular rhythm.     Heart sounds: No murmur heard. Pulmonary:     Effort: Pulmonary effort is normal. No respiratory distress.     Breath sounds: Normal breath sounds. No stridor.  Abdominal:     General: Abdomen is flat. There is no distension.     Palpations: Abdomen is soft. There is no mass.     Tenderness: There is no abdominal tenderness. There is no guarding.     Hernia: No hernia is present.     Comments: Robotic incisions healed  Musculoskeletal:        General: No swelling or deformity. Normal range of motion.     Cervical back: Normal range of motion and neck supple.  Skin:    General: Skin is warm and dry.     Capillary Refill: Capillary refill takes less than 2 seconds.  Neurological:     Mental Status: She is alert.  Psychiatric:        Mood and Affect: Mood normal.        Behavior: Behavior normal.         Thought Content: Thought content normal.        Judgment: Judgment normal.      Assessment/Plan:  Christina [name] is almost 4 months out after repair of giant paraesophageal hernia.  She continues to improve.  No evidence of complications related to her surgery.  She does have other multiple medical issues and some frailty that may account for nonspecific symptoms.  From our general surgery perspective I do not  have much to add. They are Very appreciative for the care F/U prn basis I have spent  30 minutes in this encounter including personally reviewing imaging studies, coordinating his care, placing orders and performing documentation.   Caroleen Hamman, MD [facility] General Surgeon

## 2022-01-09 ENCOUNTER — Ambulatory Visit (INDEPENDENT_AMBULATORY_CARE_PROVIDER_SITE_OTHER): Payer: Medicare Other

## 2022-01-09 DIAGNOSIS — N1831 Chronic kidney disease, stage 3a: Secondary | ICD-10-CM | POA: Diagnosis not present

## 2022-01-09 DIAGNOSIS — I480 Paroxysmal atrial fibrillation: Secondary | ICD-10-CM | POA: Diagnosis not present

## 2022-01-09 DIAGNOSIS — E782 Mixed hyperlipidemia: Secondary | ICD-10-CM | POA: Diagnosis not present

## 2022-01-09 DIAGNOSIS — I5030 Unspecified diastolic (congestive) heart failure: Secondary | ICD-10-CM | POA: Diagnosis not present

## 2022-01-09 DIAGNOSIS — J449 Chronic obstructive pulmonary disease, unspecified: Secondary | ICD-10-CM

## 2022-01-09 DIAGNOSIS — I739 Peripheral vascular disease, unspecified: Secondary | ICD-10-CM | POA: Diagnosis not present

## 2022-01-09 DIAGNOSIS — I071 Rheumatic tricuspid insufficiency: Secondary | ICD-10-CM | POA: Diagnosis not present

## 2022-01-09 DIAGNOSIS — G4733 Obstructive sleep apnea (adult) (pediatric): Secondary | ICD-10-CM | POA: Diagnosis not present

## 2022-01-09 DIAGNOSIS — Z951 Presence of aortocoronary bypass graft: Secondary | ICD-10-CM | POA: Diagnosis not present

## 2022-01-09 DIAGNOSIS — I10 Essential (primary) hypertension: Secondary | ICD-10-CM | POA: Diagnosis not present

## 2022-01-09 DIAGNOSIS — I2510 Atherosclerotic heart disease of native coronary artery without angina pectoris: Secondary | ICD-10-CM | POA: Diagnosis not present

## 2022-01-09 NOTE — Progress Notes (Signed)
Chronic Care Management Pharmacy Note  02/02/2022 Name:  Christina [name]:  [id number] DOB:  10/25/1935  Summary: Patient presents for CCM Follow-up.   Medication reconcillation performed today.   Recommendations/Changes made from today's visit: Continue current medications   Plan: CPP follow-up 3 months  Subjective: Christina [name] is an 86 y.o. year old female who is a primary patient of [name], [place].  The CCM team was consulted for assistance with disease management and care coordination needs.    Engaged with patient by telephone for follow up visit in response to provider referral for pharmacy case management and/or care coordination services.   Consent to Services:  The patient was given information about Chronic Care Management services, agreed to services, and gave verbal consent prior to initiation of services.  Please see initial visit note for detailed documentation.   Patient Care Team: [name], FNP as PCP - General (Family Medicine) [name] as Consulting Physician (Pulmonary Disease) [name], MD as Consulting Physician (Ophthalmology)  Recent office visits: 12/22/21: Patient presented to [name], FNP for follow-up. HCTZ 25  12/22/21: Patient presented to [name], [place] for cellulits. Doxycycline.   Recent consult visits: 01/03/22: Patient presented to Dr. Lanney Gins (Pulmonology)  12/06/21: Patient presented to Gladstone Pih, NP for follow-up.  12/05/21: Patient presented to Dr. Lucky Cowboy (Vascular) for follow-up.   Hospital visits: 09/05/21: Patient hospitalized for hernia surgery.  06/08/21: Patient admitted for endoscopy  3/5-04/11/21: Patient hospitalized for COPD exacerbation. Amlodipine stopped.   Objective:  Lab Results  Component Value Date   CREATININE 1.24 (H) 01/15/2022   BUN 24 01/15/2022   EGFR 43 (L) 01/15/2022   GFRNONAA 54 (L) 09/25/2021   GFRAA 47 (L) 03/18/2020   NA 139 01/15/2022   K 2.7 (L) 01/15/2022    CALCIUM 9.8 01/15/2022   CO2 30 (H) 01/15/2022   GLUCOSE 117 (H) 01/15/2022    Lab Results  Component Value Date/Time   HGBA1C 6.3 (H) 01/15/2022 02:13 PM   HGBA1C 5.9 (H) 03/18/2020 10:59 AM   HGBA1C 6.0 12/28/2013 12:00 AM    Last diabetic Eye exam: No results found for: "HMDIABEYEEXA"  Last diabetic Foot exam: No results found for: "HMDIABFOOTEX"   Lab Results  Component Value Date   CHOL 159 03/18/2020   HDL 59 03/18/2020   LDLCALC 75 03/18/2020   TRIG 143 03/18/2020   CHOLHDL 3.0 02/20/2019       Latest Ref Rng & Units 09/25/2021   11:58 AM 09/14/2021    9:20 AM 09/08/2021    5:50 AM  Hepatic Function  Total Protein 6.5 - 8.1 g/dL 6.4  5.6  5.2   Albumin 3.5 - 5.0 g/dL 2.9   2.6   AST 15 - 41 U/L 27   70   ALT 0 - 44 U/L 17   24   Alk Phosphatase 38 - 126 U/L 144   71   Total Bilirubin 0.3 - 1.2 mg/dL 1.0   1.4     Lab Results  Component Value Date/Time   TSH 4.590 (H) 03/18/2020 10:59 AM   TSH 3.470 02/20/2019 09:58 AM       Latest Ref Rng & Units 01/15/2022    2:13 PM 09/25/2021   11:58 AM 09/18/2021    4:45 AM  CBC  WBC 3.4 - 10.8 x10E3/uL 6.4  6.5  7.9   Hemoglobin 11.1 - 15.9 g/dL 12.4  10.0  9.8   Hematocrit 34.0 - 46.6 %  37.2  31.9  29.1   Platelets 150 - 450 x10E3/uL 195  549  311     Lab Results  Component Value Date/Time   VD25OH 44.9 01/15/2022 02:13 PM    Clinical ASCVD: No  The ASCVD Risk score (Arnett DK, et al., 2019) failed to calculate for the following reasons:   The 2019 ASCVD risk score is only valid for ages 83 to 73       01/15/2022    1:59 PM 12/15/2021   11:08 AM 07/11/2021    2:59 PM  Depression screen PHQ 2/9  Decreased Interest  3 0  Down, Depressed, Hopeless  1 0  PHQ - 2 Score  4 0  Altered sleeping _0 Tired, decreased energy _1 Change in appetite 0 0 0  Feeling bad or failure about yourself  0 0 0  Trouble concentrating 0 0 0  Moving slowly or fidgety/restless 0 0 0  Suicidal thoughts 0 0 0  PHQ-9  Score  8 5  Difficult doing work/chores Not difficult at [name] Somewhat difficult Not difficult at [name]   Social History   Tobacco Use  Smoking Status Former   Packs/day: 0.50   Years: 5.00   Total pack years: 2.50   Types: Cigarettes   Quit date: 04/24/2004   Years since quitting: 17.7   Passive exposure: Past  Smokeless Tobacco Never   BP Readings from Last 3 Encounters:  01/15/22 (!) 133/57  01/01/22 (!) 131/59  12/22/21 (!) 143/81   Pulse Readings from Last 3 Encounters:  01/15/22 65  01/01/22 73  12/22/21 62   Wt Readings from Last 3 Encounters:  01/15/22 147 lb 11.2 oz (67 kg)  01/01/22 152 lb 6.4 oz (69.1 kg)  12/22/21 150 lb (68 kg)   BMI Readings from Last 3 Encounters:  01/15/22 27.01 kg/m  01/01/22 27.87 kg/m  12/22/21 27.44 kg/m    Assessment/Interventions: Review of patient past medical history, allergies, medications, health status, including review of consultants reports, laboratory and other test data, was performed as part of comprehensive evaluation and provision of chronic care management services.   SDOH:  (Social Determinants of Health) assessments and interventions performed: Yes SDOH Interventions    Flowsheet Row Chronic Care Management from 03/31/2021 in [place] from 02/23/2020 in [place] Interventions    Financial Strain Interventions Intervention Not Indicated --  Physical Activity Interventions -- Patient Refused       [place]: No Food Insecurity (02/23/2020)  Housing: Low Risk  (02/23/2020)  Transportation Needs: No Transportation Needs (02/23/2020)  Alcohol Screen: Low Risk  (01/15/2022)  Depression (PHQ2-9): Medium Risk (01/15/2022)  Financial Resource Strain: Low Risk  (04/10/2021)  Physical Activity: Inactive (02/23/2020)  Social Connections: Moderately Integrated (02/23/2020)  Stress: No Stress Concern Present (02/23/2020)  Tobacco Use: Medium Risk  (01/15/2022)    CCM Care Plan  Allergies  Allergen Reactions   Baclofen Other (See Comments)   Codeine Nausea Only   Plavix [Clopidogrel]     brusing [name] over   Sulfa Antibiotics Nausea Only   Latex Rash    Medications Reviewed Today     Reviewed by [name], FNP (Family Nurse Practitioner) on 01/15/22 at 1433  Med List Status: <None>   Medication Order Taking? Sig Documenting Provider Last Dose Status Informant  acetaminophen (TYLENOL) 500 MG tablet [id number] Yes Take 500 mg by mouth every 8 (eight) hours  as needed for mild pain or moderate pain. [provider] Taking Active Self           Med Note Rosemarie Beath, MELISSA B   Wed Feb 20, 2017  1:14 PM)    apixaban (ELIQUIS) 2.5 MG TABS tablet [id number] Yes Take by mouth 2 (two) times daily. [provider] Taking Active   Ascorbic Acid (VITAMIN C) 1000 MG tablet [id number] Yes Take 1,000 mg by mouth daily. [provider] Taking Active Self  azelastine (ASTELIN) 0.1 % nasal spray [id number] Yes Place 1 spray into both nostrils daily as needed. [provider] Taking Active Self  carboxymethylcellulose (REFRESH PLUS) 0.5 % SOLN [id number] Yes 1 drop 3 (three) times daily as needed. [provider] Taking Active Self  Coenzyme Q10 (COQ-10) 100 MG capsule [id number] Yes Take 1 capsule (100 mg total) by mouth daily. [name], FNP Taking Active Self  diclofenac Sodium (VOLTAREN) 1 % GEL [id number] Yes Apply 2 g topically 4 (four) times daily. [provider] Taking Active   docusate sodium (COLACE) 100 MG capsule [id number] Yes Take 200 mg by mouth at bedtime. [provider] Taking Active Self  hydrochlorothiazide (HYDRODIURIL) 25 MG tablet [id number] Yes Take 1 tablet (25 mg total) by mouth daily. [name], FNP Taking Active   ipratropium-albuterol (DUONEB) 0.5-2.5 (3) MG/3ML SOLN [id number] Yes Take 3 mLs by nebulization 2 (two) times daily. [provider] Taking  Active   Metoprolol Tartrate 75 MG TABS [id number] Yes TAKE 1 TABLET BY MOUTH TWICE A DAY Ostwalt, Janna, PA-C Taking Active   mineral oil-hydrophilic petrolatum (AQUAPHOR) ointment [id number] Yes Apply topically as needed for dry skin. [name], FNP Taking Active   rosuvastatin (CRESTOR) 20 MG tablet [id number] Yes Take 1 tablet (20 mg total) by mouth every evening. [name], FNP Taking Active   tobramycin (TOBREX) 0.3 % ophthalmic solution [id number] Yes  [provider] Taking Active Self  torsemide (DEMADEX) 20 MG tablet [id number] Yes Take 20 mg by mouth daily. [provider] Taking Active            Med Note Daron Offer A   Tue Jan 09, 2022 11:26 AM) Prescribed by Dr. Lanney Gins             Patient Active Problem List   Diagnosis Date Noted   Weight loss, unintentional 01/15/2022   Cold intolerance 01/15/2022   Prediabetes 01/15/2022   Cellulitis of left lower extremity 12/15/2021   Swelling of limb 12/05/2021   Thrombocytopenia ([place]) 09/09/2021   Fever 09/07/2021   Acute respiratory failure with hypoxia (HCC)    S/P repair of paraesophageal hernia 09/05/2021   Impaired fasting glucose 04/11/2021   Pneumonia 04/09/2021   PAF (paroxysmal atrial fibrillation) ([place])    Primary hypertension 11/24/2020   Need for influenza vaccination 11/03/2020   Hiatal hernia with gastroesophageal reflux disease without esophagitis 11/03/2020   Atherosclerosis of both carotid arteries 03/18/2020   Memory loss 03/18/2020   Hand arthritis 03/18/2020   Neck pain 03/18/2020   Aortic atherosclerosis ([place]) 10/26/2016   Claudication ([place]) 10/26/2016   COPD (chronic obstructive pulmonary disease) ([place]) 10/22/2016   OSA on CPAP 09/16/2015   Abnormal chest x-ray 01/25/2015   Atrophic vaginitis 01/25/2015   Coronary artery disease 01/25/2015   Stage 3a chronic kidney disease ([place]) 01/25/2015   Bloodgood disease 01/25/2015   Blood glucose elevated 01/25/2015    Hyperlipidemia 01/25/2015   Cannot sleep 01/25/2015   Hypertension 11/02/2014  Acid reflux 11/02/2014   Arthritis 10/19/2014   Adenomatous colon polyp 07/27/2013   Gastric catarrh 07/27/2013   Hiatal hernia 07/27/2013   Barrett esophagus 07/07/2013   Osteopenia 12/13/2009   PAD (peripheral artery disease) ([place]) 09/24/2007   DD (diverticular disease) 12/19/2005    Immunization History  Administered Date(s) Administered   Fluad Quad(high Dose 65+) 10/20/2018, 10/21/2019, 11/03/2020, 11/08/2021   Influenza Split 12/01/2005, 11/17/2007   Influenza, High Dose Seasonal PF 10/25/2016, 10/21/2019   Influenza-Unspecified 10/06/2013, 10/07/2015, 10/25/2016, 10/28/2017   PFIZER(Purple Top)SARS-COV-2 Vaccination 02/27/2019, 03/20/2019, 11/09/2019   Pneumococcal Conjugate-13 12/28/2013   Pneumococcal Polysaccharide-23 05/31/2003   Zoster, Live 03/17/2008    Conditions to be addressed/monitored:  Hypertension, Hyperlipidemia, Atrial Fibrillation, GERD, COPD, Chronic Kidney Disease, and Osteopenia  Care Plan : General Pharmacy (Adult)  Updates made by Germaine Pomfret, RPH since 02/02/2022 12:00 AM     Problem: Hypertension, Hyperlipidemia, GERD, COPD, Chronic Kidney Disease, and Osteopenia   Priority: High     Long-Range Goal: Patient-Specific Goal   Start Date: 04/10/2021  Expected End Date: 04/11/2022  This Visit's Progress: On track  Recent Progress: On track  Priority: High  Note:   Current Barriers:  Unable to self administer medications as prescribed  Pharmacist Clinical Goal(s):  Patient will maintain control of blood pressure as evidenced by BP less than 140/90  through collaboration with PharmD and provider.   Interventions: 1:1 collaboration with [name], FNP regarding development and update of comprehensive plan of care as evidenced by provider attestation and co-signature Inter-disciplinary care team collaboration (see longitudinal plan of care) Comprehensive  medication review performed; medication list updated in electronic medical record  Hypertension (BP goal <140/90) -Controlled -Current treatment: HCTZ 25 mg daily: Appropriate, Effective, Safe, Accessible  Metoprolol tartrate 75 mg twice daily: Appropriate, Effective, Safe Accessible  Torsemide 20 mg daily: Appropriate, Effective, Safe Accessible    -Medications previously tried: Amlodipine  -Current home readings:  -Denies hypotensive/hypertensive symptoms -Peripheral edema has improved, she is still having leg pain. She is wearing compression stockings most days. Reports pain is manageable, but  > R. She endorses redness.  -Recommended to continue current medication  Atrial Fibrillation (Goal: prevent stroke and major bleeding) -Controlled -CHADSVASC: 5 -Current treatment: Rate control: Metoprolol tartrate 75 mg twice daily Anticoagulation: Eliquis 2.5 mg twice daily -Medications previously tried: NA -Recommended to continue current medication  Hyperlipidemia: (LDL goal < 70) -Uncontrolled -History of PAD, PVD, CAD s/p CABG x 3 (2006)  -Current treatment: Rosuvastatin 20 mg daily: Appropriate, Effective, Safe, Accessible  -Medications previously tried: Simvastatin  -Recommended to continue current medication  COPD (Goal: control symptoms and prevent exacerbations) -Controlled -Current treatment  ProAir HFA 2 puffs every 6 hours as needed: Appropriate, Effective, Safe, Accessible  Symbicort 1 puff twice daily : Appropriate, Query Effective -Medications previously tried: NA  -Exacerbations requiring treatment in last 6 months: Yes hospitalized for pneumonia 04/09/21 -Patient reports consistent use of maintenance inhaler -Frequency of rescue inhaler use: 1-2x weekly  -Recommended to continue current medication  Osteoarthritis (Goal: Prevent inflammation) -Controlled -Current treatment  Acetaminophen 500 mg every 8 hours as needed -Medications previously tried: NA   -Recommended to continue current medication  Patient Goals/Self-Care Activities Patient will:  - check blood pressure weekly, document, and provide at future appointments  Follow Up Plan: Telephone follow up appointment with care management team member scheduled for:  07/16/2022 at 11:00 AM    Medication Assistance: None required.  Patient affirms current coverage meets needs.  Compliance/Adherence/Medication  fill history: Care Gaps: Shingrix Vaccine Dexa Scan COVID-19 Vaccine  Star-Rating Drugs: Rosuvastatin 20 mg last filled 01/21/2021 [facility].  Patient's preferred pharmacy is:  [id number] [place], [place] [place]Phone: [phone]: [phone] [place]: [phone]: [phone] [place]Phone: [phone]: [phone] Uses pill box? Yes Pt endorses 100% compliance  We discussed: Current pharmacy is preferred with insurance plan and patient is satisfied with pharmacy services Patient decided to: Continue current medication management strategy  Care Plan and Follow Up Patient Decision:  Patient agrees to Care Plan and Follow-up.  Plan: Telephone follow up appointment with care management team member scheduled for:  07/16/2022 at 11:00 AM  AJunius Argyle PharmD, [phone]

## 2022-01-15 ENCOUNTER — Encounter: Payer: Self-pay | Admitting: Family Medicine

## 2022-01-15 ENCOUNTER — Ambulatory Visit: Payer: Medicare Other | Admitting: Nurse Practitioner

## 2022-01-15 ENCOUNTER — Ambulatory Visit (INDEPENDENT_AMBULATORY_CARE_PROVIDER_SITE_OTHER): Payer: Medicare Other | Admitting: Family Medicine

## 2022-01-15 VITALS — BP 133/57 | HR 65 | Temp 97.4°F | Ht 62.0 in | Wt 147.7 lb

## 2022-01-15 DIAGNOSIS — N1831 Chronic kidney disease, stage 3a: Secondary | ICD-10-CM

## 2022-01-15 DIAGNOSIS — R634 Abnormal weight loss: Secondary | ICD-10-CM | POA: Insufficient documentation

## 2022-01-15 DIAGNOSIS — R7303 Prediabetes: Secondary | ICD-10-CM | POA: Diagnosis not present

## 2022-01-15 DIAGNOSIS — R6889 Other general symptoms and signs: Secondary | ICD-10-CM | POA: Diagnosis not present

## 2022-01-15 DIAGNOSIS — L03116 Cellulitis of left lower limb: Secondary | ICD-10-CM | POA: Diagnosis not present

## 2022-01-15 NOTE — Assessment & Plan Note (Signed)
Pt reports 3 weeks of weight loss symptoms following change in diuretic dosing Will check TSH and CBC today

## 2022-01-15 NOTE — Assessment & Plan Note (Signed)
Acute, improved Reports continuous use of TED hose to assist Edema is resolved; inflammation is slight. Pt denies pain or discharge/drainage.  Continue support hose to assist

## 2022-01-15 NOTE — Assessment & Plan Note (Addendum)
Chronic, last eGFR of 50 Pt endorses weight loss and fatigue; will repeat BMP given change from lasix to torsemide to assist- completed by Dr A at [facility]; also sees White NP with [facility]

## 2022-01-15 NOTE — Assessment & Plan Note (Signed)
Acute on chronic, may be vascular concerns in s/o winter temps Recommend CBC and TSH

## 2022-01-15 NOTE — Assessment & Plan Note (Signed)
Chronic, stable with diet/exercise Repeat A1c Continue to recommend balanced, lower carb meals. Smaller meal size, adding snacks. Choosing water as drink of choice and increasing purposeful exercise.

## 2022-01-15 NOTE — Progress Notes (Signed)
I,Tiffany J Bragg,acting as a scribe for [name], FNP.,have documented all relevant documentation on the behalf of [name], FNP,as directed by  [name], FNP while in the presence of [name], FNP.   Established patient visit  Patient: Christina [name]   DOB: 1935-06-27   86 y.o. Female  MRN: [id number] Visit Date: 01/15/2022  Today's healthcare provider: [name], FNP  Re Introduced to nurse practitioner role and practice setting.  All questions answered.  Discussed provider/patient relationship and expectations.  Subjective    HPI  Follow up for Leg swelling   The patient was last seen for this 1 months ago. Changes made at last visit include changes in vitamins.  Pt complain of leg pain, that has resolved.   Pt is worried about weight changes over the last 3 weeks   -----------------------------------------------------------------------------------------  ---------------------------------------------------------------------------------------------------   Medications: Outpatient Medications Prior to Visit  Medication Sig   acetaminophen (TYLENOL) 500 MG tablet Take 500 mg by mouth every 8 (eight) hours as needed for mild pain or moderate pain.   apixaban (ELIQUIS) 2.5 MG TABS tablet Take by mouth 2 (two) times daily.   Ascorbic Acid (VITAMIN C) 1000 MG tablet Take 1,000 mg by mouth daily.   azelastine (ASTELIN) 0.1 % nasal spray Place 1 spray into both nostrils daily as needed.   carboxymethylcellulose (REFRESH PLUS) 0.5 % SOLN 1 drop 3 (three) times daily as needed.   Coenzyme Q10 (COQ-10) 100 MG capsule Take 1 capsule (100 mg total) by mouth daily.   diclofenac Sodium (VOLTAREN) 1 % GEL Apply 2 g topically 4 (four) times daily.   docusate sodium (COLACE) 100 MG capsule Take 200 mg by mouth at bedtime.   hydrochlorothiazide (HYDRODIURIL) 25 MG tablet Take 1 tablet (25 mg total) by mouth daily.   ipratropium-albuterol (DUONEB) 0.5-2.5 (3)  MG/3ML SOLN Take 3 mLs by nebulization 2 (two) times daily.   Metoprolol Tartrate 75 MG TABS TAKE 1 TABLET BY MOUTH TWICE A DAY   mineral oil-hydrophilic petrolatum (AQUAPHOR) ointment Apply topically as needed for dry skin.   rosuvastatin (CRESTOR) 20 MG tablet Take 1 tablet (20 mg total) by mouth every evening.   tobramycin (TOBREX) 0.3 % ophthalmic solution    torsemide (DEMADEX) 20 MG tablet Take 20 mg by mouth daily.   No facility-administered medications prior to visit.    Review of Systems  Last CBC Lab Results  Component Value Date   WBC 6.5 09/25/2021   HGB 10.0 (L) 09/25/2021   HCT 31.9 (L) 09/25/2021   MCV 100.9 (H) 09/25/2021   MCH 31.6 09/25/2021   RDW 13.8 09/25/2021   PLT 549 (H) 46/50/3546   Last metabolic panel Lab Results  Component Value Date   GLUCOSE 142 (H) 09/25/2021   NA 132 (L) 09/25/2021   K 3.2 (L) 09/25/2021   CL 92 (L) 09/25/2021   CO2 30 09/25/2021   BUN 21 09/25/2021   CREATININE 1.03 (H) 09/25/2021   GFRNONAA 54 (L) 09/25/2021   CALCIUM 9.3 09/25/2021   PHOS 3.2 09/25/2021   PROT 6.4 (L) 09/25/2021   ALBUMIN 2.9 (L) 09/25/2021   LABGLOB 2.0 03/18/2020   AGRATIO 2.3 (H) 03/18/2020   BILITOT 1.0 09/25/2021   ALKPHOS 144 (H) 09/25/2021   AST 27 09/25/2021   ALT 17 09/25/2021   ANIONGAP 10 09/25/2021      Objective    BP (!) 133/57   Pulse 65   Temp (!)  97.4 F (36.3 C)   Ht 5' 2" (1.575 m)   Wt 147 lb 11.2 oz (67 kg)   SpO2 97%   BMI 27.01 kg/m   BP Readings from Last 3 Encounters:  01/15/22 (!) 133/57  01/01/22 (!) 131/59  12/22/21 (!) 143/81   Wt Readings from Last 3 Encounters:  01/15/22 147 lb 11.2 oz (67 kg)  01/01/22 152 lb 6.4 oz (69.1 kg)  12/22/21 150 lb (68 kg)   SpO2 Readings from Last 3 Encounters:  01/15/22 97%  01/01/22 96%  12/22/21 97%   Physical Exam Vitals and nursing note reviewed.  Constitutional:      General: She is not in acute distress.    Appearance: Normal appearance. She is overweight.  She is not ill-appearing, toxic-appearing or diaphoretic.  HENT:     Head: Normocephalic and atraumatic.  Cardiovascular:     Rate and Rhythm: Normal rate and regular rhythm.     Pulses: Normal pulses.     Heart sounds: Normal heart sounds. No murmur heard.    No friction rub. No gallop.  Pulmonary:     Effort: Pulmonary effort is normal. No respiratory distress.     Breath sounds: Normal breath sounds. No stridor. No wheezing, rhonchi or rales.  Chest:     Chest wall: No tenderness.  Abdominal:     Comments: Denies GI concerns or BRBPR; last colon cancer screening 2010 with Dr Vira Agar   Musculoskeletal:        General: No swelling, tenderness, deformity or signs of injury. Normal range of motion.     Right lower leg: No edema.     Left lower leg: No edema.  Skin:    General: Skin is warm and dry.     Capillary Refill: Capillary refill takes less than 2 seconds.     Coloration: Skin is not jaundiced or pale.     Findings: No bruising, erythema, lesion or rash.  Neurological:     General: No focal deficit present.     Mental Status: She is alert and oriented to person, place, and time. Mental status is at baseline.     Cranial Nerves: No cranial nerve deficit.     Sensory: No sensory deficit.     Motor: No weakness.     Coordination: Coordination normal.  Psychiatric:        Mood and Affect: Mood normal.        Behavior: Behavior normal.        Thought Content: Thought content normal.        Judgment: Judgment normal.     No results found for any visits on 01/15/22.  Assessment & Plan     Problem List Items Addressed This Visit       Genitourinary   Stage 3a chronic kidney disease ([place])    Chronic, last eGFR of 50 Pt endorses weight loss and fatigue; will repeat BMP given change from lasix to torsemide to assist- completed by Dr A at [facility]; also sees White NP with [facility]      Relevant Orders   Basic Metabolic Panel (BMET)     Other   Cellulitis of left lower  extremity    Acute, improved Reports continuous use of TED hose to assist Edema is resolved; inflammation is slight. Pt denies pain or discharge/drainage.  Continue support hose to assist       Cold intolerance    Acute on chronic, may be vascular concerns in s/o  winter temps Recommend CBC and TSH      Relevant Orders   Vitamin D (25 hydroxy)   B12 and Folate Panel   Prediabetes    Chronic, stable with diet/exercise Repeat A1c Continue to recommend balanced, lower carb meals. Smaller meal size, adding snacks. Choosing water as drink of choice and increasing purposeful exercise.       Relevant Orders   Hemoglobin A1c   Weight loss, unintentional - Primary    Pt reports 3 weeks of weight loss symptoms following change in diuretic dosing Will check TSH and CBC today      Relevant Orders   CBC   Return in about 3 months (around 04/16/2022) for annual examination.     Vonna Kotyk, FNP, have reviewed all documentation for this visit. The documentation on 01/15/22 for the exam, diagnosis, procedures, and orders are all accurate and complete.  [name], [place] [phone] (phone) [phone] (fax)  [place]

## 2022-01-16 LAB — HEMOGLOBIN A1C
Est. average glucose Bld gHb Est-mCnc: 134 mg/dL
Hgb A1c MFr Bld: 6.3 % — ABNORMAL HIGH (ref 4.8–5.6)

## 2022-01-16 LAB — BASIC METABOLIC PANEL
BUN/Creatinine Ratio: 19 (ref 12–28)
BUN: 24 mg/dL (ref 8–27)
CO2: 30 mmol/L — ABNORMAL HIGH (ref 20–29)
Calcium: 9.8 mg/dL (ref 8.7–10.3)
Chloride: 93 mmol/L — ABNORMAL LOW (ref 96–106)
Creatinine, Ser: 1.24 mg/dL — ABNORMAL HIGH (ref 0.57–1.00)
Glucose: 117 mg/dL — ABNORMAL HIGH (ref 70–99)
Potassium: 2.7 mmol/L — ABNORMAL LOW (ref 3.5–5.2)
Sodium: 139 mmol/L (ref 134–144)
eGFR: 43 mL/min/{1.73_m2} — ABNORMAL LOW (ref >59)

## 2022-01-16 LAB — CBC
Hematocrit: 37.2 % (ref 34.0–46.6)
Hemoglobin: 12.4 g/dL (ref 11.1–15.9)
MCH: 32.4 pg (ref 26.6–33.0)
MCHC: 33.3 g/dL (ref 31.5–35.7)
MCV: 97 fL (ref 79–97)
Platelets: 195 10*3/uL (ref 150–450)
RBC: 3.83 x10E6/uL (ref 3.77–5.28)
RDW: 14.3 % (ref 11.7–15.4)
WBC: 6.4 10*3/uL (ref 3.4–10.8)

## 2022-01-16 LAB — B12 AND FOLATE PANEL
Folate: >20 ng/mL (ref >3.0)
Vitamin B-12: 814 pg/mL (ref 232–1245)

## 2022-01-16 LAB — VITAMIN D 25 HYDROXY (VIT D DEFICIENCY, FRACTURES): Vit D, 25-Hydroxy: 44.9 ng/mL (ref 30.0–100.0)

## 2022-01-16 NOTE — Progress Notes (Signed)
Please reach out to Dr A at [facility]; White NP at [facility] cardiology; as your potassium is very low and creatinine is elevated. Advise use of oral potassium and possible addition of spiro to assist with potassium retention. Labs confirm prediabetes which has been stable without medication for 6 years. Continue to recommend balanced, lower carb meals. Smaller meal size, adding snacks. Choosing water as drink of choice and increasing purposeful exercise. Cell count stable as are vitamins.

## 2022-01-24 DIAGNOSIS — I517 Cardiomegaly: Secondary | ICD-10-CM | POA: Diagnosis not present

## 2022-01-24 DIAGNOSIS — Z951 Presence of aortocoronary bypass graft: Secondary | ICD-10-CM | POA: Diagnosis not present

## 2022-01-24 DIAGNOSIS — R0602 Shortness of breath: Secondary | ICD-10-CM | POA: Diagnosis not present

## 2022-01-24 DIAGNOSIS — J8410 Pulmonary fibrosis, unspecified: Secondary | ICD-10-CM | POA: Diagnosis not present

## 2022-02-02 NOTE — Patient Instructions (Signed)
Visit Information It was great speaking with you today!  Please let me know if you have any questions about our visit.   Goals Addressed             This Visit's Progress    Track and Manage My Triggers-COPD   On track    Timeframe:  Long-Range Goal Priority:  High Start Date: 04/03/2021                           Expected End Date: 04/03/2022                      Follow Up within 30 days   - eliminate smoking in my home - identify and remove indoor air pollutants - limit outdoor activity during cold weather    Why is this important?   Triggers are activities or things, like tobacco smoke or cold weather, that make your COPD (chronic obstructive pulmonary disease) flare-up.  Knowing these triggers helps you plan how to stay away from them.  When you cannot remove them, you can learn how to manage them.     Notes:         Patient Care Plan: General Pharmacy (Adult)     Problem Identified: Hypertension, Hyperlipidemia, GERD, COPD, Chronic Kidney Disease, and Osteopenia   Priority: High     Long-Range Goal: Patient-Specific Goal   Start Date: 04/10/2021  Expected End Date: 04/11/2022  This Visit's Progress: On track  Recent Progress: On track  Priority: High  Note:   Current Barriers:  Unable to self administer medications as prescribed  Pharmacist Clinical Goal(s):  Patient will maintain control of blood pressure as evidenced by BP less than 140/90  through collaboration with PharmD and provider.   Interventions: 1:1 collaboration with [name], FNP regarding development and update of comprehensive plan of care as evidenced by provider attestation and co-signature Inter-disciplinary care team collaboration (see longitudinal plan of care) Comprehensive medication review performed; medication list updated in electronic medical record  Hypertension (BP goal <140/90) -Controlled -Current treatment: HCTZ 25 mg daily: Appropriate, Effective, Safe, Accessible   Metoprolol tartrate 75 mg twice daily: Appropriate, Effective, Safe Accessible  Torsemide 20 mg daily: Appropriate, Effective, Safe Accessible    -Medications previously tried: Amlodipine  -Current home readings:  -Denies hypotensive/hypertensive symptoms -Peripheral edema has improved, she is still having leg pain. She is wearing compression stockings most days. Reports pain is manageable, but  > R. She endorses redness.  -Recommended to continue current medication  Atrial Fibrillation (Goal: prevent stroke and major bleeding) -Controlled -CHADSVASC: 5 -Current treatment: Rate control: Metoprolol tartrate 75 mg twice daily Anticoagulation: Eliquis 2.5 mg twice daily -Medications previously tried: NA -Recommended to continue current medication  Hyperlipidemia: (LDL goal < 70) -Uncontrolled -History of PAD, PVD, CAD s/p CABG x 3 (2006)  -Current treatment: Rosuvastatin 20 mg daily: Appropriate, Effective, Safe, Accessible  -Medications previously tried: Simvastatin  -Recommended to continue current medication  COPD (Goal: control symptoms and prevent exacerbations) -Controlled -Current treatment  ProAir HFA 2 puffs every 6 hours as needed: Appropriate, Effective, Safe, Accessible  Symbicort 1 puff twice daily : Appropriate, Query Effective -Medications previously tried: NA  -Exacerbations requiring treatment in last 6 months: Yes hospitalized for pneumonia 04/09/21 -Patient reports consistent use of maintenance inhaler -Frequency of rescue inhaler use: 1-2x weekly  -Recommended to continue current medication  Osteoarthritis (Goal: Prevent inflammation) -Controlled -Current treatment  Acetaminophen 500  mg every 8 hours as needed -Medications previously tried: NA  -Recommended to continue current medication  Patient Goals/Self-Care Activities Patient will:  - check blood pressure weekly, document, and provide at future appointments  Follow Up Plan: Telephone follow up  appointment with care management team member scheduled for:  07/16/2022 at 11:00 AM      Patient agreed to services and verbal consent obtained.   The patient verbalized understanding of instructions, educational materials, and care plan provided today and DECLINED offer to receive copy of patient instructions, educational materials, and care plan.   Junius Argyle, PharmD, [place], CPP  Clinical Pharmacist Practitioner  [facility] [phone]

## 2022-02-04 DIAGNOSIS — I10 Essential (primary) hypertension: Secondary | ICD-10-CM

## 2022-02-04 DIAGNOSIS — J449 Chronic obstructive pulmonary disease, unspecified: Secondary | ICD-10-CM

## 2022-02-12 ENCOUNTER — Telehealth: Payer: Self-pay

## 2022-02-12 NOTE — Progress Notes (Cosign Needed)
Care Management & Coordination Services Pharmacy Team  Reason for Encounter: Hypertension  Contacted patient on 02/12/2022 to discuss hypertension disease state.   Recent office visits:  01/15/2022 [name] FNP (PCP) No Medication Changes noted, Return in about 3 months   Recent consult visits:  01/24/2022 Dr. Lanney Gins MD (Pulmonology)  No Medication Changes noted 25/42/7062 Gladstone Pih NP (Cardiology) No Medication Changes noted, Return in about 4 months   Hospital visits:  None in previous 6 months  Medications: Outpatient Encounter Medications as of 02/12/2022  Medication Sig Note   acetaminophen (TYLENOL) 500 MG tablet Take 500 mg by mouth every 8 (eight) hours as needed for mild pain or moderate pain.    apixaban (ELIQUIS) 2.5 MG TABS tablet Take by mouth 2 (two) times daily.    Ascorbic Acid (VITAMIN C) 1000 MG tablet Take 1,000 mg by mouth daily.    azelastine (ASTELIN) 0.1 % nasal spray Place 1 spray into both nostrils daily as needed.    carboxymethylcellulose (REFRESH PLUS) 0.5 % SOLN 1 drop 3 (three) times daily as needed.    Coenzyme Q10 (COQ-10) 100 MG capsule Take 1 capsule (100 mg total) by mouth daily.    diclofenac Sodium (VOLTAREN) 1 % GEL Apply 2 g topically 4 (four) times daily.    docusate sodium (COLACE) 100 MG capsule Take 200 mg by mouth at bedtime.    hydrochlorothiazide (HYDRODIURIL) 25 MG tablet Take 1 tablet (25 mg total) by mouth daily.    ipratropium-albuterol (DUONEB) 0.5-2.5 (3) MG/3ML SOLN Take 3 mLs by nebulization 2 (two) times daily.    Metoprolol Tartrate 75 MG TABS TAKE 1 TABLET BY MOUTH TWICE A DAY    mineral oil-hydrophilic petrolatum (AQUAPHOR) ointment Apply topically as needed for dry skin.    rosuvastatin (CRESTOR) 20 MG tablet Take 1 tablet (20 mg total) by mouth every evening.    tobramycin (TOBREX) 0.3 % ophthalmic solution     torsemide (DEMADEX) 20 MG tablet Take 20 mg by mouth daily. 01/09/2022: Prescribed by Dr. Lanney Gins     No facility-administered encounter medications on file as of 02/12/2022.    Recent Office Vitals: BP Readings from Last 3 Encounters:  01/15/22 (!) 133/57  01/01/22 (!) 131/59  12/22/21 (!) 143/81   Pulse Readings from Last 3 Encounters:  01/15/22 65  01/01/22 73  12/22/21 62    Wt Readings from Last 3 Encounters:  01/15/22 147 lb 11.2 oz (67 kg)  01/01/22 152 lb 6.4 oz (69.1 kg)  12/22/21 150 lb (68 kg)     Kidney Function Lab Results  Component Value Date/Time   CREATININE 1.24 (H) 01/15/2022 02:13 PM   CREATININE 1.03 (H) 09/25/2021 11:58 AM   GFRNONAA 54 (L) 09/25/2021 11:58 AM   GFRAA 47 (L) 03/18/2020 10:59 AM       Latest Ref Rng & Units 01/15/2022    2:13 PM 09/25/2021   11:58 AM 09/17/2021    4:55 AM  BMP  Glucose 70 - 99 mg/dL 117  142  121   BUN 8 - 27 mg/dL '24  21  18   '$ Creatinine 0.57 - 1.00 mg/dL 1.24  1.03  0.99   BUN/Creat Ratio 12 - 28 19     Sodium 134 - 144 mmol/L 139  132  133   Potassium 3.5 - 5.2 mmol/L 2.7  3.2  5.2   Chloride 96 - 106 mmol/L 93  92  100   CO2 20 - 29 mmol/L 30  30  28   Calcium 8.7 - 10.3 mg/dL 9.8  9.3  8.2      Current antihypertensive regimen:  HCTZ 25 mg daily  Metoprolol tartrate 75 mg twice daily  Torsemide 20 mg daily    Patient verbally confirms she is taking the above medications as directed. Yes  How often are you checking your Blood Pressure?  Patient states she has not been checking her blood pressure at home.Patient is aware I will follow up with her in one month regarding her blood pressure.Patient reports she had sereval follow ups , and all her blood pressure has been "normal".Patient denies any headaches, dizziness, but reports having a "cough".  Current home BP readings: None ID Patient states she will try to check her blood pressure this month as I will follow up in one month.   Wrist or arm cuff: Arm Cuff  Any readings above 180/120? None ID  What recent interventions/DTPs have been made by any  provider to improve Blood Pressure control since last CPP Visit: None ID  Any recent hospitalizations or ED visits since last visit with CPP? No  What diet changes have been made to improve Blood Pressure Control?  Patient denies any changes.   What exercise is being done to improve your Blood Pressure Control?  Patient denies any changes.   Adherence Review: Is the patient currently on ACE/ARB medication? No Does the patient have >5 day gap between last estimated fill dates? No  Care Gaps: Shingrix Vaccine Dexa Scan COVID-19 Vaccine Medicare Wellness visit Tdap Vaccine   Star Rating Drugs: Rosuvastatin 20 mg last filled 01/15/2022 90 day supply at [facility].   [place] Pharmacist Assistant [phone]

## 2022-02-21 DIAGNOSIS — H353211 Exudative age-related macular degeneration, right eye, with active choroidal neovascularization: Secondary | ICD-10-CM | POA: Diagnosis not present

## 2022-02-21 DIAGNOSIS — H35432 Paving stone degeneration of retina, left eye: Secondary | ICD-10-CM | POA: Diagnosis not present

## 2022-02-21 DIAGNOSIS — H3581 Retinal edema: Secondary | ICD-10-CM | POA: Diagnosis not present

## 2022-02-21 DIAGNOSIS — H43813 Vitreous degeneration, bilateral: Secondary | ICD-10-CM | POA: Diagnosis not present

## 2022-02-21 DIAGNOSIS — H353223 Exudative age-related macular degeneration, left eye, with inactive scar: Secondary | ICD-10-CM | POA: Diagnosis not present

## 2022-02-21 DIAGNOSIS — H353221 Exudative age-related macular degeneration, left eye, with active choroidal neovascularization: Secondary | ICD-10-CM | POA: Diagnosis not present

## 2022-02-28 DIAGNOSIS — Z1231 Encounter for screening mammogram for malignant neoplasm of breast: Secondary | ICD-10-CM | POA: Diagnosis not present

## 2022-02-28 LAB — HM MAMMOGRAPHY: HM Mammogram: NORMAL (ref 0–4)

## 2022-03-12 ENCOUNTER — Telehealth: Payer: Self-pay

## 2022-03-12 NOTE — Progress Notes (Signed)
Care Management & Coordination Services Pharmacy Team  Reason for Encounter: Hypertension  Contacted patient to discuss hypertension disease state. Spoke with patient on 03/12/2022     Current antihypertensive regimen:  HCTZ 25 mg daily  Metoprolol tartrate 75 mg twice daily  Torsemide 20 mg daily    Patient verbally confirms she is taking the above medications as directed. Yes  How often are you checking your Blood Pressure?  Patient states she has not been checking her blood pressure at home because she had so many doctor appointments. Patient reports she is going to restart checking her blood pressure "hopefully this month". Patient denies any headaches, and  dizziness.  Current home BP readings: None ID  Wrist or arm cuff: Patient reports she use arm cuff.  Caffeine intake: Patient reports she has one cup of coffee at breakfast, and will have some soda at lunch.Patient states she will drink water for the rest of the day.  Salt intake: Patient reports she adds a lot of salt while cooking and at the table.Patient states she is aware she needs to work on limiting her salt intake.   Any readings above 180/100? None ID If yes any symptoms of hypertensive emergency?   Patient states she will try to check her blood pressure this month as I will follow up in one month.   What recent interventions/DTPs have been made by any provider to improve Blood Pressure control since last CPP Visit: No  Any recent hospitalizations or ED visits since last visit with CPP? No  What diet changes have been made to improve Blood Pressure Control?  Patient denies any changes.   What exercise is being done to improve your Blood Pressure Control?  Patient denies any changes.   Adherence Review: Is the patient currently on ACE/ARB medication? No Does the patient have >5 day gap between last estimated fill dates? No  Star Rating Drugs:  Medication:Rosuvastatin 20 mg Last Fill:01/15/2022 90 Day  Supply at [facility].   Patient reports she was started on magnesium and potassium recently.  Care Gaps: Shingrix Vaccine Dexa Scan COVID-19 Vaccine Medicare Wellness visit Tdap Vaccine  Chart Updates: Recent office visits:  None ID  Recent consult visits:  None ID  Hospital visits:  None in previous 6 months  Medications: Outpatient Encounter Medications as of 03/12/2022  Medication Sig   acetaminophen (TYLENOL) 500 MG tablet Take 500 mg by mouth every 8 (eight) hours as needed for mild pain or moderate pain.   apixaban (ELIQUIS) 2.5 MG TABS tablet Take by mouth 2 (two) times daily.   Ascorbic Acid (VITAMIN C) 1000 MG tablet Take 1,000 mg by mouth daily.   azelastine (ASTELIN) 0.1 % nasal spray Place 1 spray into both nostrils daily as needed.   carboxymethylcellulose (REFRESH PLUS) 0.5 % SOLN 1 drop 3 (three) times daily as needed.   Coenzyme Q10 (COQ-10) 100 MG capsule Take 1 capsule (100 mg total) by mouth daily.   diclofenac Sodium (VOLTAREN) 1 % GEL Apply 2 g topically 4 (four) times daily.   docusate sodium (COLACE) 100 MG capsule Take 200 mg by mouth at bedtime.   hydrochlorothiazide (HYDRODIURIL) 25 MG tablet Take 1 tablet (25 mg total) by mouth daily.   ipratropium-albuterol (DUONEB) 0.5-2.5 (3) MG/3ML SOLN Take 3 mLs by nebulization 2 (two) times daily.   Metoprolol Tartrate 75 MG TABS TAKE 1 TABLET BY MOUTH TWICE A DAY   mineral oil-hydrophilic petrolatum (AQUAPHOR) ointment Apply topically as needed for dry skin.  rosuvastatin (CRESTOR) 20 MG tablet Take 1 tablet (20 mg total) by mouth every evening.   tobramycin (TOBREX) 0.3 % ophthalmic solution    No facility-administered encounter medications on file as of 03/12/2022.    Recent Office Vitals: BP Readings from Last 3 Encounters:  01/15/22 (!) 133/57  01/01/22 (!) 131/59  12/22/21 (!) 143/81   Pulse Readings from Last 3 Encounters:  01/15/22 65  01/01/22 73  12/22/21 62    Wt Readings from Last 3  Encounters:  01/15/22 147 lb 11.2 oz (67 kg)  01/01/22 152 lb 6.4 oz (69.1 kg)  12/22/21 150 lb (68 kg)     Kidney Function Lab Results  Component Value Date/Time   CREATININE 1.24 (H) 01/15/2022 02:13 PM   CREATININE 1.03 (H) 09/25/2021 11:58 AM   GFRNONAA 54 (L) 09/25/2021 11:58 AM   GFRAA 47 (L) 03/18/2020 10:59 AM       Latest Ref Rng & Units 01/15/2022    2:13 PM 09/25/2021   11:58 AM 09/17/2021    4:55 AM  BMP  Glucose 70 - 99 mg/dL 117  142  121   BUN 8 - 27 mg/dL 24  21  18   $ Creatinine 0.57 - 1.00 mg/dL 1.24  1.03  0.99   BUN/Creat Ratio 12 - 28 19     Sodium 134 - 144 mmol/L 139  132  133   Potassium 3.5 - 5.2 mmol/L 2.7  3.2  5.2   Chloride 96 - 106 mmol/L 93  92  100   CO2 20 - 29 mmol/L 30  30  28   $ Calcium 8.7 - 10.3 mg/dL 9.8  9.3  8.2      [facility] Clinical Pharmacist Assistant [phone]

## 2022-03-23 ENCOUNTER — Telehealth: Payer: Self-pay | Admitting: Family Medicine

## 2022-04-11 ENCOUNTER — Telehealth: Payer: Self-pay

## 2022-04-11 NOTE — Progress Notes (Signed)
Care Management & Coordination Services Pharmacy Team  Reason for Encounter: Hypertension  Contacted patient to discuss hypertension disease state. Spoke with patient on 04/11/2022     Current antihypertensive regimen:  HCTZ 25 mg daily  Metoprolol tartrate 75 mg twice daily  Torsemide 20 mg daily   Patient verbally confirms she is taking the above medications as directed. Yes  How often are you checking your Blood Pressure?  Patient states she has been "slacking off", and has not check her blood pressure.    Current home BP readings: None ID  Wrist or arm cuff: Patient reports she use arm cuff.   Caffeine intake: Patient reports she has one cup of coffee at breakfast, and will have some soda at lunch.Patient states she will drink water for the rest of the day.   Salt intake: Patient reports she adds a lot of salt while cooking and at the table.Patient states she is aware she needs to work on limiting her salt intake.  OTC medications including pseudoephedrine or NSAIDs?  Any readings above 180/100? None ID  What recent interventions/DTPs have been made by any provider to improve Blood Pressure control since last CPP Visit: None ID  Any recent hospitalizations or ED visits since last visit with CPP? No  What diet changes have been made to improve Blood Pressure Control?  Patient denies any changes.    What exercise is being done to improve your Blood Pressure Control?  Patient reports she walks every once in awhile.   Patient states since she started taking magnesium and potassium she has felt better.Patient reports she has a follow up with her pulmonologist on 04/29/2022.  Patient denies questions/concerns for the clinical pharmacist at this time.   Adherence Review: Is the patient currently on ACE/ARB medication? No Does the patient have >5 day gap between last estimated fill dates? No   Care Gaps: Shingrix Vaccine Dexa Scan COVID-19 Vaccine Medicare Wellness  visit Tdap Vaccine   Star Rating Drugs:  Medication:Rosuvastatin 20 mg Last Fill:01/15/2022 [facility] at [facility].   Chart Updates: Recent office visits:  None ID  Recent consult visits:  None ID  Hospital visits:  None in previous 6 months  Medications: Outpatient Encounter Medications as of 04/11/2022  Medication Sig   acetaminophen (TYLENOL) 500 MG tablet Take 500 mg by mouth every 8 (eight) hours as needed for mild pain or moderate pain.   apixaban (ELIQUIS) 2.5 MG TABS tablet Take by mouth 2 (two) times daily.   Ascorbic Acid (VITAMIN C) 1000 MG tablet Take 1,000 mg by mouth daily.   azelastine (ASTELIN) 0.1 % nasal spray Place 1 spray into both nostrils daily as needed.   carboxymethylcellulose (REFRESH PLUS) 0.5 % SOLN 1 drop 3 (three) times daily as needed.   Coenzyme Q10 (COQ-10) 100 MG capsule Take 1 capsule (100 mg total) by mouth daily.   diclofenac Sodium (VOLTAREN) 1 % GEL Apply 2 g topically 4 (four) times daily.   docusate sodium (COLACE) 100 MG capsule Take 200 mg by mouth at bedtime.   hydrochlorothiazide (HYDRODIURIL) 25 MG tablet Take 1 tablet (25 mg total) by mouth daily.   ipratropium-albuterol (DUONEB) 0.5-2.5 (3) MG/3ML SOLN Take 3 mLs by nebulization 2 (two) times daily.   Metoprolol Tartrate 75 MG TABS TAKE 1 TABLET BY MOUTH TWICE A DAY   mineral oil-hydrophilic petrolatum (AQUAPHOR) ointment Apply topically as needed for dry skin.   rosuvastatin (CRESTOR) 20 MG tablet Take 1 tablet (20 mg total) by  mouth every evening.   tobramycin (TOBREX) 0.3 % ophthalmic solution    No facility-administered encounter medications on file as of 04/11/2022.    Recent Office Vitals: BP Readings from Last 3 Encounters:  01/15/22 (!) 133/57  01/01/22 (!) 131/59  12/22/21 (!) 143/81   Pulse Readings from Last 3 Encounters:  01/15/22 65  01/01/22 73  12/22/21 62    Wt Readings from Last 3 Encounters:  01/15/22 147 lb 11.2 oz (67 kg)  01/01/22 152 lb 6.4 oz  (69.1 kg)  12/22/21 150 lb (68 kg)     Kidney Function Lab Results  Component Value Date/Time   CREATININE 1.24 (H) 01/15/2022 02:13 PM   CREATININE 1.03 (H) 09/25/2021 11:58 AM   GFRNONAA 54 (L) 09/25/2021 11:58 AM   GFRAA 47 (L) 03/18/2020 10:59 AM       Latest Ref Rng & Units 01/15/2022    2:13 PM 09/25/2021   11:58 AM 09/17/2021    4:55 AM  BMP  Glucose 70 - 99 mg/dL 117  142  121   BUN 8 - 27 mg/dL '24  21  18   '$ Creatinine 0.57 - 1.00 mg/dL 1.24  1.03  0.99   BUN/Creat Ratio 12 - 28 19     Sodium 134 - 144 mmol/L 139  132  133   Potassium 3.5 - 5.2 mmol/L 2.7  3.2  5.2   Chloride 96 - 106 mmol/L 93  92  100   CO2 20 - 29 mmol/L '30  30  28   '$ Calcium 8.7 - 10.3 mg/dL 9.8  9.3  8.2      [facility] Clinical Pharmacist Assistant [phone]

## 2022-04-17 ENCOUNTER — Encounter: Payer: Medicare Other | Admitting: Family Medicine

## 2022-04-25 DIAGNOSIS — M25541 Pain in joints of right hand: Secondary | ICD-10-CM | POA: Diagnosis not present

## 2022-04-25 DIAGNOSIS — M25542 Pain in joints of left hand: Secondary | ICD-10-CM | POA: Diagnosis not present

## 2022-04-25 DIAGNOSIS — J811 Chronic pulmonary edema: Secondary | ICD-10-CM | POA: Diagnosis not present

## 2022-05-16 DIAGNOSIS — H35432 Paving stone degeneration of retina, left eye: Secondary | ICD-10-CM | POA: Diagnosis not present

## 2022-05-16 DIAGNOSIS — H353221 Exudative age-related macular degeneration, left eye, with active choroidal neovascularization: Secondary | ICD-10-CM | POA: Diagnosis not present

## 2022-05-16 DIAGNOSIS — H26493 Other secondary cataract, bilateral: Secondary | ICD-10-CM | POA: Diagnosis not present

## 2022-05-16 DIAGNOSIS — H353211 Exudative age-related macular degeneration, right eye, with active choroidal neovascularization: Secondary | ICD-10-CM | POA: Diagnosis not present

## 2022-05-16 DIAGNOSIS — H3581 Retinal edema: Secondary | ICD-10-CM | POA: Diagnosis not present

## 2022-05-16 DIAGNOSIS — H43813 Vitreous degeneration, bilateral: Secondary | ICD-10-CM | POA: Diagnosis not present

## 2022-05-18 ENCOUNTER — Telehealth: Payer: Self-pay

## 2022-05-18 NOTE — Progress Notes (Signed)
Care Management & Coordination Services Pharmacy Team  Reason for Encounter: General adherence update   Contacted Christina [name] for general health update and medication adherence call.   Spoke with Christina [name] on 05/18/2022    What concerns do you have about your medications? Christina [name] denies any concerns at this time.  The Christina [name] denies side effects with their medications.   How often do you forget or accidentally miss a dose? Never  Do you use a pillbox? No  Are you having any problems getting your medications from your pharmacy? No  Has the cost of your medications been a concern? No   Since last visit with PharmD, no interventions have been made.   The Christina [name] has not had an ED visit since last contact.   The Christina [name] denies problems with their health.   Christina [name] denies concerns or questions for Julious Payer, PharmD at this time.   Counseled Christina [name] on: Great job taking medications  Care Gaps: Shingrix Vaccine Dexa Scan COVID-19 Vaccine Medicare Wellness visit Tdap Vaccine   Star Rating Drugs:  Medication:Rosuvastatin 20 mg Last Fill:04/13/2022 [facility].   Chart Updates:  Recent office visits:  None ID  Recent consult visits:  None ID  Hospital visits:  None in previous 6 months  Medications: Outpatient Encounter Medications as of 05/18/2022  Medication Sig   acetaminophen (TYLENOL) 500 MG tablet Take 500 mg by mouth every 8 (eight) hours as needed for mild pain or moderate pain.   apixaban (ELIQUIS) 2.5 MG TABS tablet Take by mouth 2 (two) times daily.   Ascorbic Acid (VITAMIN C) 1000 MG tablet Take 1,000 mg by mouth daily.   azelastine (ASTELIN) 0.1 % nasal spray Place 1 spray into both nostrils daily as needed.   carboxymethylcellulose (REFRESH PLUS) 0.5 % SOLN 1 drop 3 (three) times daily as needed.   Coenzyme Q10 (COQ-10) 100 MG capsule Take 1 capsule (100 mg total) by mouth daily.   diclofenac Sodium (VOLTAREN) 1 % GEL Apply 2 g  topically 4 (four) times daily.   docusate sodium (COLACE) 100 MG capsule Take 200 mg by mouth at bedtime.   hydrochlorothiazide (HYDRODIURIL) 25 MG tablet Take 1 tablet (25 mg total) by mouth daily.   ipratropium-albuterol (DUONEB) 0.5-2.5 (3) MG/3ML SOLN Take 3 mLs by nebulization 2 (two) times daily.   Metoprolol Tartrate 75 MG TABS TAKE 1 TABLET BY MOUTH TWICE A DAY   mineral oil-hydrophilic petrolatum (AQUAPHOR) ointment Apply topically as needed for dry skin.   rosuvastatin (CRESTOR) 20 MG tablet Take 1 tablet (20 mg total) by mouth every evening.   tobramycin (TOBREX) 0.3 % ophthalmic solution    No facility-administered encounter medications on file as of 05/18/2022.    Recent vitals BP Readings from Last 3 Encounters:  01/15/22 (!) 133/57  01/01/22 (!) 131/59  12/22/21 (!) 143/81   Pulse Readings from Last 3 Encounters:  01/15/22 65  01/01/22 73  12/22/21 62   Wt Readings from Last 3 Encounters:  01/15/22 147 lb 11.2 oz (67 kg)  01/01/22 152 lb 6.4 oz (69.1 kg)  12/22/21 150 lb (68 kg)   BMI Readings from Last 3 Encounters:  01/15/22 27.01 kg/m  01/01/22 27.87 kg/m  12/22/21 27.44 kg/m    Recent lab results    Component Value Date/Time   NA 139 01/15/2022 1413   K 2.7 (L) 01/15/2022 1413   CL 93 (L) 01/15/2022 1413   CO2 30 (H) 01/15/2022 1413   GLUCOSE 117 (H) 01/15/2022 1413  GLUCOSE 142 (H) 09/25/2021 1158   BUN 24 01/15/2022 1413   CREATININE 1.24 (H) 01/15/2022 1413   CALCIUM 9.8 01/15/2022 1413    Lab Results  Component Value Date   CREATININE 1.24 (H) 01/15/2022   EGFR 43 (L) 01/15/2022   GFRNONAA 54 (L) 09/25/2021   GFRAA 47 (L) 03/18/2020   Lab Results  Component Value Date/Time   HGBA1C 6.3 (H) 01/15/2022 02:13 PM   HGBA1C 5.9 (H) 03/18/2020 10:59 AM   HGBA1C 6.0 12/28/2013 12:00 AM    Lab Results  Component Value Date   CHOL 159 03/18/2020   HDL 59 03/18/2020   LDLCALC 75 03/18/2020   TRIG 143 03/18/2020   CHOLHDL 3.0 02/20/2019     Everlean Cherry Clinical Pharmacist Assistant [phone]

## 2022-05-21 DIAGNOSIS — I071 Rheumatic tricuspid insufficiency: Secondary | ICD-10-CM | POA: Diagnosis not present

## 2022-05-21 DIAGNOSIS — I739 Peripheral vascular disease, unspecified: Secondary | ICD-10-CM | POA: Diagnosis not present

## 2022-05-21 DIAGNOSIS — G4733 Obstructive sleep apnea (adult) (pediatric): Secondary | ICD-10-CM | POA: Diagnosis not present

## 2022-05-21 DIAGNOSIS — J449 Chronic obstructive pulmonary disease, unspecified: Secondary | ICD-10-CM | POA: Diagnosis not present

## 2022-05-21 DIAGNOSIS — Z951 Presence of aortocoronary bypass graft: Secondary | ICD-10-CM | POA: Diagnosis not present

## 2022-05-21 DIAGNOSIS — N1831 Chronic kidney disease, stage 3a: Secondary | ICD-10-CM | POA: Diagnosis not present

## 2022-05-21 DIAGNOSIS — I10 Essential (primary) hypertension: Secondary | ICD-10-CM | POA: Diagnosis not present

## 2022-05-21 DIAGNOSIS — I480 Paroxysmal atrial fibrillation: Secondary | ICD-10-CM | POA: Diagnosis not present

## 2022-05-21 DIAGNOSIS — E782 Mixed hyperlipidemia: Secondary | ICD-10-CM | POA: Diagnosis not present

## 2022-05-21 DIAGNOSIS — I5030 Unspecified diastolic (congestive) heart failure: Secondary | ICD-10-CM | POA: Diagnosis not present

## 2022-05-21 DIAGNOSIS — I2510 Atherosclerotic heart disease of native coronary artery without angina pectoris: Secondary | ICD-10-CM | POA: Diagnosis not present

## 2022-05-26 ENCOUNTER — Other Ambulatory Visit: Payer: Self-pay | Admitting: Physician Assistant

## 2022-05-26 DIAGNOSIS — I10 Essential (primary) hypertension: Secondary | ICD-10-CM

## 2022-06-06 ENCOUNTER — Telehealth: Payer: Self-pay

## 2022-06-06 DIAGNOSIS — M79642 Pain in left hand: Secondary | ICD-10-CM | POA: Diagnosis not present

## 2022-06-06 DIAGNOSIS — M7989 Other specified soft tissue disorders: Secondary | ICD-10-CM | POA: Diagnosis not present

## 2022-06-06 DIAGNOSIS — M19042 Primary osteoarthritis, left hand: Secondary | ICD-10-CM | POA: Diagnosis not present

## 2022-06-06 DIAGNOSIS — M064 Inflammatory polyarthropathy: Secondary | ICD-10-CM | POA: Diagnosis not present

## 2022-06-06 DIAGNOSIS — M19041 Primary osteoarthritis, right hand: Secondary | ICD-10-CM | POA: Diagnosis not present

## 2022-06-06 DIAGNOSIS — Z7960 Long term (current) use of unspecified immunomodulators and immunosuppressants: Secondary | ICD-10-CM | POA: Diagnosis not present

## 2022-06-06 DIAGNOSIS — M79641 Pain in right hand: Secondary | ICD-10-CM | POA: Diagnosis not present

## 2022-06-06 DIAGNOSIS — M79672 Pain in left foot: Secondary | ICD-10-CM | POA: Diagnosis not present

## 2022-06-06 NOTE — Progress Notes (Signed)
Care Management & Coordination Services Pharmacy Team  Reason for Encounter: COPD  Contacted patient to discuss COPD disease state. Spoke with patient on 06/08/2022   Current COPD regimen:  ipratropium-albuterol 0.5-2.5 MG/3ML Solution 2 times daily      No data to display         Any recent hospitalizations or ED visits since last visit with CPP? No  Denies COPD symptoms, including Increased shortness of breath , Rescue medicine is not helping, Shortness of breath at rest, Symptoms worse with exercise, and Symptoms worse at night.  Patient reports she does wake up sometimes throughout the night wheezing, but this is not often.   What recent interventions/DTPs have been made by any provider to improve breathing since last visit:  Have you had exacerbation/flare-up since last visit? No  What do you do when you are short of breath?  Rest and Other Patient states she uses her nebulizer machine.   Respiratory Devices/Equipment Do you have a nebulizer? Yes Do you use a Peak Flow Meter? No Do you use a maintenance inhaler? No How often do you forget to use your daily inhaler? N/A Do you use a rescue inhaler? No How often do you use your rescue inhaler?  never Do you use a spacer with your inhaler? No  Adherence Review: Does the patient have >5 day gap between last estimated fill date for maintenance inhaler medications? No  Patient states her arthritis provider prescribe her two medications for her to start- Prednisone 5 mg, and Hydroxychloroquine 200 mg.Patient states she felt uncomfortable starting them so she reach out to her eye doctor to make sure they were okay for her to take.Patient states her Ophthalmologist inform her it is okay for her to start prednisone but not  Hydroxychloroquine.Patient reports she has not started prednisone yet,but may next week.Patient states she has a follow up with her arthritis provider on 07/17/2022  Patient reports she has some symptoms of  dizziness, but is unsure if it is related to all the medications she is taking.Patient states she will like to discuss this with the Clinical pharmacist at her follow up appointment on 07/16/2022.  Patient states she has surgery coming up on her eyes at the end of June.  Care Gaps: Shingrix Vaccine Dexa Scan COVID-19 Vaccine Medicare Wellness visit Tdap Vaccine   Star Rating Drugs:  Medication:Rosuvastatin 20 mg Last Fill:04/13/2022 [facility]   Chart Review:  Recent office visits:  None ID  Recent consult visits:  None ID  Hospital visits:  None in previous 6 months  Medications: Outpatient Encounter Medications as of 06/06/2022  Medication Sig   acetaminophen (TYLENOL) 500 MG tablet Take 500 mg by mouth every 8 (eight) hours as needed for mild pain or moderate pain.   apixaban (ELIQUIS) 2.5 MG TABS tablet Take by mouth 2 (two) times daily.   Ascorbic Acid (VITAMIN C) 1000 MG tablet Take 1,000 mg by mouth daily.   azelastine (ASTELIN) 0.1 % nasal spray Place 1 spray into both nostrils daily as needed.   carboxymethylcellulose (REFRESH PLUS) 0.5 % SOLN 1 drop 3 (three) times daily as needed.   Coenzyme Q10 (COQ-10) 100 MG capsule Take 1 capsule (100 mg total) by mouth daily.   diclofenac Sodium (VOLTAREN) 1 % GEL Apply 2 g topically 4 (four) times daily.   docusate sodium (COLACE) 100 MG capsule Take 200 mg by mouth at bedtime.   hydrochlorothiazide (HYDRODIURIL) 25 MG tablet Take 1 tablet (25 mg  total) by mouth daily.   ipratropium-albuterol (DUONEB) 0.5-2.5 (3) MG/3ML SOLN Take 3 mLs by nebulization 2 (two) times daily.   Metoprolol Tartrate 75 MG TABS TAKE 1 TABLET BY MOUTH TWICE A DAY   mineral oil-hydrophilic petrolatum (AQUAPHOR) ointment Apply topically as needed for dry skin.   rosuvastatin (CRESTOR) 20 MG tablet Take 1 tablet (20 mg total) by mouth every evening.   tobramycin (TOBREX) 0.3 % ophthalmic solution    No facility-administered encounter  medications on file as of 06/06/2022.     Everlean Cherry Clinical Pharmacist Assistant [phone]

## 2022-06-20 ENCOUNTER — Ambulatory Visit (INDEPENDENT_AMBULATORY_CARE_PROVIDER_SITE_OTHER): Payer: Medicare Other

## 2022-06-20 DIAGNOSIS — G4733 Obstructive sleep apnea (adult) (pediatric): Secondary | ICD-10-CM | POA: Diagnosis not present

## 2022-06-20 NOTE — Progress Notes (Signed)
95 percentile pressure 12   95th percentile leak 52.7   apnea index 2.8 /hr  apnea-hypopnea index  3.6 /hr   total days used  >4 hr 88 days  total days used <4 hr 2 days  Total compliance 98 percent  She is doing great, had questions on supplies, will update AHP system. No other problems or questions at this time.   Pt was seen by Tresa Endo  RRT/RCP  from [facility]

## 2022-07-13 ENCOUNTER — Other Ambulatory Visit: Payer: Self-pay | Admitting: Family Medicine

## 2022-07-13 DIAGNOSIS — E7800 Pure hypercholesterolemia, unspecified: Secondary | ICD-10-CM

## 2022-07-13 NOTE — Telephone Encounter (Signed)
Requested medications are due for refill today.  yes  Requested medications are on the active medications list.  yes  Last refill. 07/11/2021 #90 3 rf  Future visit scheduled.   no  Notes to clinic.  Labs are expired    Requested Prescriptions  Pending Prescriptions Disp Refills   rosuvastatin (CRESTOR) 20 MG tablet [Pharmacy Med Name: ROSUVASTATIN CALCIUM 20 MG TAB] 90 tablet 3    Sig: TAKE 1 TABLET BY MOUTH EVERY DAY IN THE EVENING     Cardiovascular:  Antilipid - Statins 2 Failed - 07/13/2022  2:31 AM      Failed - Cr in normal range and within 360 days    Creatinine, Ser  Date Value Ref Range Status  01/15/2022 1.24 (H) 0.57 - 1.00 mg/dL Final         Failed - Lipid Panel in normal range within the last 12 months    Cholesterol, Total  Date Value Ref Range Status  03/18/2020 159 100 - 199 mg/dL Final   LDL Chol Calc (NIH)  Date Value Ref Range Status  03/18/2020 75 0 - 99 mg/dL Final   HDL  Date Value Ref Range Status  03/18/2020 59 >39 mg/dL Final   Triglycerides  Date Value Ref Range Status  03/18/2020 143 0 - 149 mg/dL Final         Passed - Patient is not pregnant      Passed - Valid encounter within last 12 months    Recent Outpatient Visits           5 months ago Weight loss, unintentional   [facility] Merita Norton T, FNP   6 months ago PAD (peripheral artery disease) [facility])   [facility] Merita Norton T, FNP   7 months ago Cellulitis of left lower extremity   [facility] Jacky Kindle, FNP   1 year ago Primary hypertension   [place] [facility] Jacky Kindle, FNP   1 year ago Hospital discharge follow-up   [facility] [place], [place], [place]

## 2022-07-16 ENCOUNTER — Ambulatory Visit: Payer: Medicare Other

## 2022-07-16 NOTE — Progress Notes (Incomplete)
Care Management & Coordination Services Pharmacy Note  07/16/2022 Name:  Christina [name] MRN:  [id number] DOB:  1935-08-19  Summary: Patient presents for follow-up consult.   Recommendations/Changes made from today's visit: ***  Follow up plan: ***   Patient-Specific Goals:  Subjective: Christina [name] is an 87 y.o. year old female who is a primary patient of Jacky Kindle, [place].  The care coordination team was consulted for assistance with disease management and care coordination needs.    Engaged with patient by telephone for follow up visit.  Recent office visits: ***  Recent consult visits: 06/06/22: Patient presented to Dr. Allena Katz (Rheumatology) for follow-up. Plaquenil.  05/21/22: Patient presented to Dr. Juliann Pares (Cardiology) for follow-up.   Hospital visits: {Hospital DC Yes/No:25215}   Objective:  Lab Results  Component Value Date   CREATININE 1.24 (H) 01/15/2022   BUN 24 01/15/2022   EGFR 43 (L) 01/15/2022   GFRNONAA 54 (L) 09/25/2021   GFRAA 47 (L) 03/18/2020   NA 139 01/15/2022   K 2.7 (L) 01/15/2022   CALCIUM 9.8 01/15/2022   CO2 30 (H) 01/15/2022   GLUCOSE 117 (H) 01/15/2022    Lab Results  Component Value Date/Time   HGBA1C 6.3 (H) 01/15/2022 02:13 PM   HGBA1C 5.9 (H) 03/18/2020 10:59 AM   HGBA1C 6.0 12/28/2013 12:00 AM    Last diabetic Eye exam: No results found for: "HMDIABEYEEXA"  Last diabetic Foot exam: No results found for: "HMDIABFOOTEX"   Lab Results  Component Value Date   CHOL 159 03/18/2020   HDL 59 03/18/2020   LDLCALC 75 03/18/2020   TRIG 143 03/18/2020   CHOLHDL 3.0 02/20/2019       Latest Ref Rng & Units 09/25/2021   11:58 AM 09/14/2021    9:20 AM 09/08/2021    5:50 AM  Hepatic Function  Total Protein 6.5 - 8.1 g/dL 6.4  5.6  5.2   Albumin 3.5 - 5.0 g/dL 2.9   2.6   AST 15 - 41 U/L 27   70   ALT 0 - 44 U/L 17   24   Alk Phosphatase 38 - 126 U/L 144   71   Total Bilirubin 0.3 - 1.2 mg/dL 1.0   1.4     Lab  Results  Component Value Date/Time   TSH 4.590 (H) 03/18/2020 10:59 AM   TSH 3.470 02/20/2019 09:58 AM       Latest Ref Rng & Units 01/15/2022    2:13 PM 09/25/2021   11:58 AM 09/18/2021    4:45 AM  CBC  WBC 3.4 - 10.8 x10E3/uL 6.4  6.5  7.9   Hemoglobin 11.1 - 15.9 g/dL 95.1  88.4  9.8   Hematocrit 34.0 - 46.6 % 37.2  31.9  29.1   Platelets 150 - 450 x10E3/uL 195  549  311     Lab Results  Component Value Date/Time   VD25OH 44.9 01/15/2022 02:13 PM   VITAMINB12 814 01/15/2022 02:13 PM    Clinical ASCVD: {YES/NO:21197} The ASCVD Risk score (Arnett DK, et al., 2019) failed to calculate for the following reasons:   The 2019 ASCVD risk score is only valid for ages 26 to 63    ***Other: (CHADS2VASc if Afib, MMRC or CAT for COPD, ACT, DEXA)     01/15/2022    1:59 PM 12/15/2021   11:08 AM 07/11/2021    2:59 PM  Depression screen PHQ 2/9  Decreased Interest  3 0  Down, Depressed, Hopeless  1  0  PHQ - 2 Score  4 0  Altered sleeping 3 2 2   Tired, decreased energy 2 2 3   Change in appetite 0 0 0  Feeling bad or failure about yourself  0 0 0  Trouble concentrating 0 0 0  Moving slowly or fidgety/restless 0 0 0  Suicidal thoughts 0 0 0  PHQ-9 Score  8 5  Difficult doing work/chores Not difficult at all Somewhat difficult Not difficult at all     Social History   Tobacco Use  Smoking Status Former   Packs/day: 0.50   Years: 5.00   Additional pack years: 0.00   Total pack years: 2.50   Types: Cigarettes   Quit date: 04/24/2004   Years since quitting: 18.2   Passive exposure: Past  Smokeless Tobacco Never   BP Readings from Last 3 Encounters:  01/15/22 (!) 133/57  01/01/22 (!) 131/59  12/22/21 (!) 143/81   Pulse Readings from Last 3 Encounters:  01/15/22 65  01/01/22 73  12/22/21 62   Wt Readings from Last 3 Encounters:  01/15/22 147 lb 11.2 oz (67 kg)  01/01/22 152 lb 6.4 oz (69.1 kg)  12/22/21 150 lb (68 kg)   BMI Readings from Last 3 Encounters:   01/15/22 27.01 kg/m  01/01/22 27.87 kg/m  12/22/21 27.44 kg/m    Allergies  Allergen Reactions   Baclofen Other (See Comments)   Codeine Nausea Only   Plavix [Clopidogrel]     brusing all over   Sulfa Antibiotics Nausea Only   Latex Rash    Medications Reviewed Today     Reviewed by Jacky Kindle, FNP (Family Nurse Practitioner) on 01/15/22 at 1433  Med List Status: <None>   Medication Order Taking? Sig Documenting Provider Last Dose Status Informant  acetaminophen (TYLENOL) 500 MG tablet [id number] Yes Take 500 mg by mouth every 8 (eight) hours as needed for mild pain or moderate pain. [provider] Taking Active Self           Med Note Kai Levins, MELISSA B   Wed Feb 20, 2017  1:14 PM)    apixaban (ELIQUIS) 2.5 MG TABS tablet [id number] Yes Take by mouth 2 (two) times daily. [provider] Taking Active   Ascorbic Acid (VITAMIN C) 1000 MG tablet [id number] Yes Take 1,000 mg by mouth daily. [provider] Taking Active Self  azelastine (ASTELIN) 0.1 % nasal spray [id number] Yes Place 1 spray into both nostrils daily as needed. [provider] Taking Active Self  carboxymethylcellulose (REFRESH PLUS) 0.5 % SOLN [id number] Yes 1 drop 3 (three) times daily as needed. [provider] Taking Active Self  Coenzyme Q10 (COQ-10) 100 MG capsule [id number] Yes Take 1 capsule (100 mg total) by mouth daily. Jacky Kindle, FNP Taking Active Self  diclofenac Sodium (VOLTAREN) 1 % GEL [id number] Yes Apply 2 g topically 4 (four) times daily. [provider] Taking Active   docusate sodium (COLACE) 100 MG capsule [id number] Yes Take 200 mg by mouth at bedtime. [provider] Taking Active Self  hydrochlorothiazide (HYDRODIURIL) 25 MG tablet [id number] Yes Take 1 tablet (25 mg total) by mouth daily. Jacky Kindle, FNP Taking Active   ipratropium-albuterol (DUONEB) 0.5-2.5 (3) MG/3ML SOLN [id number] Yes Take 3 mLs by nebulization 2 (two) times  daily. [provider] Taking Active   Metoprolol Tartrate 75 MG TABS [id number] Yes TAKE 1 TABLET BY MOUTH TWICE A DAY Ostwalt, Janna, PA-C Taking Active   mineral oil-hydrophilic petrolatum (AQUAPHOR) ointment  [id number] Yes Apply topically as needed for dry skin. Jacky Kindle, FNP Taking Active   rosuvastatin (CRESTOR) 20 MG tablet [id number] Yes Take 1 tablet (20 mg total) by mouth every evening. Jacky Kindle, FNP Taking Active   tobramycin (TOBREX) 0.3 % ophthalmic solution [id number] Yes  [provider] Taking Active Self  torsemide (DEMADEX) 20 MG tablet [id number] Yes Take 20 mg by mouth daily. [provider] Taking Active            Med Note Lauretta Chester, Coy Saunas A   Tue Jan 09, 2022 11:26 AM) Prescribed by Dr. Karna Christmas             SDOH:  (Social Determinants of Health) assessments and interventions performed: {yes/no:20286} SDOH Interventions    Flowsheet Row Chronic Care Management from 03/31/2021 in [facility] Clinical Support from 02/23/2020 in [facility]  SDOH Interventions    Financial Strain Interventions Intervention Not Indicated --  Physical Activity Interventions -- Patient Refused       Medication Assistance: {MEDASSISTANCEINFO:25044}  Medication Access: Within the past 30 days, how often has patient missed a dose of medication? *** Is a pillbox or other method used to improve adherence? {YES/NO:21197} Factors that may affect medication adherence? {CHL DESC; BARRIERS:21522} Are meds synced by current pharmacy? {YES/NO:21197} Are meds delivered by current pharmacy? {YES/NO:21197} Does patient experience delays in picking up medications due to transportation concerns? {YES/NO:21197}  Compliance/Adherence/Medication fill history: Care Gaps: ***  Star-Rating Drugs: ***   Assessment/Plan  Hypertension (BP goal <130/80) -Controlled -Current treatment: HCTZ 25 mg daily:  Appropriate, Effective, Safe, Accessible  Metoprolol tartrate 75 mg twice daily: Appropriate, Effective, Safe Accessible Torsemide 20 mg daily  -Medications previously tried: Amlodipine, Torsemide  -Current home readings:  131/61; 59 119/59; 64  126/59; 66  130/63; 62  135/62; 71 124/65; 68   -Denies hypotensive/hypertensive symptoms -Recommended to continue current medication  Atrial Fibrillation (Goal: prevent stroke and major bleeding) -Controlled -CHADSVASC: 5 -Current treatment: Rate control: Metoprolol tartrate 75 mg twice daily Anticoagulation: Eliquis 2.5 mg twice daily -Medications previously tried: NA -Recommended to continue current medication  Hyperlipidemia: (LDL goal < 70) -Uncontrolled -History of PAD, PVD, CAD s/p CABG x 3 (2006)  -Current treatment: Rosuvastatin 20 mg daily: Appropriate, Effective, Safe, Accessible  -Medications previously tried: Simvastatin  -Recommended to continue current medication  COPD (Goal: control symptoms and prevent exacerbations) -Controlled -Current treatment  DuoNeb (uses twice daily)   -Medications previously tried: NA  -Exacerbations requiring treatment in last 6 months: No -Patient reports consistent use of maintenance inhaler -Frequency of rescue inhaler use: 1-2x weekly  -Recommended to continue current medication  Osteoarthritis (Goal: Prevent inflammation) -Uncontrolled -Current treatment  Acetaminophen 500 mg every 8 hours as needed -Medications previously tried: Prednisone, Plaquenil (never started)  -  -Recommended to continue current medication  Chronic Kidney Disease Stage 3b  -All medications assessed for renal dosing and appropriateness in chronic kidney disease. -{CCMPHARMDINTERVENTION:25122}   Follow Up Plan: {CM FOLLOW UP PLAN:22241}    ***

## 2022-07-17 DIAGNOSIS — Z7960 Long term (current) use of unspecified immunomodulators and immunosuppressants: Secondary | ICD-10-CM | POA: Diagnosis not present

## 2022-07-17 DIAGNOSIS — M19042 Primary osteoarthritis, left hand: Secondary | ICD-10-CM | POA: Diagnosis not present

## 2022-07-17 DIAGNOSIS — M19041 Primary osteoarthritis, right hand: Secondary | ICD-10-CM | POA: Diagnosis not present

## 2022-07-17 DIAGNOSIS — M0609 Rheumatoid arthritis without rheumatoid factor, multiple sites: Secondary | ICD-10-CM | POA: Diagnosis not present

## 2022-07-31 DIAGNOSIS — H353221 Exudative age-related macular degeneration, left eye, with active choroidal neovascularization: Secondary | ICD-10-CM | POA: Diagnosis not present

## 2022-07-31 DIAGNOSIS — H26492 Other secondary cataract, left eye: Secondary | ICD-10-CM | POA: Diagnosis not present

## 2022-07-31 DIAGNOSIS — H26493 Other secondary cataract, bilateral: Secondary | ICD-10-CM | POA: Diagnosis not present

## 2022-07-31 DIAGNOSIS — H353114 Nonexudative age-related macular degeneration, right eye, advanced atrophic with subfoveal involvement: Secondary | ICD-10-CM | POA: Diagnosis not present

## 2022-07-31 DIAGNOSIS — H18413 Arcus senilis, bilateral: Secondary | ICD-10-CM | POA: Diagnosis not present

## 2022-07-31 DIAGNOSIS — Z961 Presence of intraocular lens: Secondary | ICD-10-CM | POA: Diagnosis not present

## 2022-08-06 ENCOUNTER — Other Ambulatory Visit: Payer: Self-pay | Admitting: Family Medicine

## 2022-08-06 DIAGNOSIS — E7800 Pure hypercholesterolemia, unspecified: Secondary | ICD-10-CM

## 2022-08-13 ENCOUNTER — Other Ambulatory Visit (INDEPENDENT_AMBULATORY_CARE_PROVIDER_SITE_OTHER): Payer: Self-pay | Admitting: Vascular Surgery

## 2022-08-13 DIAGNOSIS — I6523 Occlusion and stenosis of bilateral carotid arteries: Secondary | ICD-10-CM

## 2022-08-14 DIAGNOSIS — H353221 Exudative age-related macular degeneration, left eye, with active choroidal neovascularization: Secondary | ICD-10-CM | POA: Diagnosis not present

## 2022-08-14 DIAGNOSIS — H43813 Vitreous degeneration, bilateral: Secondary | ICD-10-CM | POA: Diagnosis not present

## 2022-08-14 DIAGNOSIS — H353213 Exudative age-related macular degeneration, right eye, with inactive scar: Secondary | ICD-10-CM | POA: Diagnosis not present

## 2022-08-18 NOTE — Progress Notes (Signed)
MRN : [id number]  Christina [name] is a 87 y.o. (1935-05-08) female who presents with chief complaint of check carotid arteries.  History of Present Illness:   The patient is seen for evaluation of carotid stenosis. The carotid stenosis was identified remotely but has not been followed recently.   The patient denies amaurosis fugax. There is no recent history of TIA symptoms or focal motor deficits. There is no prior documented CVA.   There is no history of migraine headaches. There is no history of seizures.   The patient is taking enteric-coated aspirin 81 mg daily.   The patient has a history of coronary artery disease, no recent episodes of angina or shortness of breath. The patient denies PAD or claudication symptoms. There is a history of hyperlipidemia which is being treated with a statin.     Duplex ultrasound of the carotid arteries shows RICA 1-39% and LICA 40-59% No significant change compared to last study.  No outpatient medications have been marked as taking for the 08/20/22 encounter (Appointment) with Gilda Crease, Latina Craver, MD.    Past Medical History:  Diagnosis Date   Adenomatous colon polyp    Anemia    Aortic atherosclerosis (HCC)    Arthritis    Barrett's esophagus    Bilateral carotid artery disease (HCC)    a.) carotid doppler 07/18/2020: 1-39% RICA, 60-79% LICA; b.) carotid doppler 08/15/2020: 70-99% LICA, no sig RICA; c.) carotid doppler 01/23/2021: 1-39% RICA, 40-59% LICA; d.) carotid doppler 08/03/2021: 40-59% LICA, near norm RICA   CKD (chronic kidney disease), stage III (HCC)    COPD (chronic obstructive pulmonary disease) (HCC)    Coronary artery disease 04/21/2004   a.) LHC 04/21/2004 [facility]): EF 66%, 50% dLCx, 75% pLCx, 75% oRCA, 50% pRCA --> transferred to [facility] for CVTS consult; b.) 3v CABG   Dyspnea on exertion    GERD (gastroesophageal reflux disease)    Hiatal hernia    Hyperlipidemia    Hypertension    Long  term current use of anticoagulant    a.) reduced dose apixaban   Macular degeneration    OSA on CPAP    PAD (peripheral artery disease) (HCC) 12/10/2016   a.) PTA of BILATERAL common iliac arteries --> 7 x 26 mm stents placed   PAF (paroxysmal atrial fibrillation) (HCC)    a.) CHA2DS2-VASc = 6 (age x 2, sex, HTN, vascular disease, T2DM);  b.) rate/rhythm maintained on oral metoprolol tartrate; chronically anticoagulated using dose reduced apixaban   Peripheral edema    PSVT (paroxysmal supraventricular tachycardia)    S/P CABG x 3 04/2004   Schatzki's ring    Scoliosis of thoracolumbar spine    Valvular heart disease    a.) TTE 04/20/2004: EF >55%, mild MR; b.)  TTE 04/10/2021: EF 50-55%, LVH, reduced RV SF, RVE, moderate BAE, mild-mod MR, severe TR.    Past Surgical History:  Procedure Laterality Date   ABDOMINAL HYSTERECTOMY  1980   due to dysfunctional uterine bleeding   APPENDECTOMY  1980   BREAST SURGERY Left 2000   biopsy   CARDIAC CATHETERIZATION     COLONOSCOPY     CORONARY ARTERY BYPASS GRAFT  2006   ESOPHAGOGASTRODUODENOSCOPY (EGD) WITH PROPOFOL N/A 06/08/2021   Procedure: ESOPHAGOGASTRODUODENOSCOPY (EGD) WITH PROPOFOL;  Surgeon: Wyline Mood, MD;  Location: [facility] ENDOSCOPY;  Service: Gastroenterology;  Laterality: N/A;  HEMORRHOID SURGERY     INSERTION OF MESH  09/05/2021   Procedure: INSERTION OF MESH;  Surgeon: Leafy Ro, MD;  Location: [facility] ORS;  Service: General;;   LOWER EXTREMITY ANGIOGRAPHY Right 02/25/2017   Procedure: LOWER EXTREMITY ANGIOGRAPHY;  Surgeon: Annice Needy, MD;  Location: [facility] INVASIVE CV LAB;  Service: Cardiovascular;  Laterality: Right;   LOWER EXTREMITY INTERVENTION  02/25/2017   Procedure: LOWER EXTREMITY INTERVENTION;  Surgeon: Annice Needy, MD;  Location: [facility] INVASIVE CV LAB;  Service: Cardiovascular;;   LUNG BIOPSY  1999   Negative   TONSILLECTOMY     XI ROBOTIC ASSISTED PARAESOPHAGEAL HERNIA REPAIR N/A 09/05/2021   Procedure: XI ROBOTIC  ASSISTED PARAESOPHAGEAL HERNIA REPAIR, RNFA to assist;  Surgeon: Leafy Ro, MD;  Location: [facility] ORS;  Service: General;  Laterality: N/A;    Social History Social History   Tobacco Use   Smoking status: Former    Current packs/day: 0.00    Average packs/day: 0.5 packs/day for 5.0 years (2.5 ttl pk-yrs)    Types: Cigarettes    Start date: 04/25/1999    Quit date: 04/24/2004    Years since quitting: 18.3    Passive exposure: Past   Smokeless tobacco: Never  Vaping Use   Vaping status: Never Used  Substance Use Topics   Alcohol use: No   Drug use: No    Family History Family History  Problem Relation Age of Onset   Hypertension Mother    Hyperlipidemia Mother    Alzheimer's disease Mother    CAD Mother    Heart attack Father    Lung disease Sister    Heart disease Sister    Diabetes Paternal Grandfather        Type 2   Hearing loss Son     Allergies  Allergen Reactions   Baclofen Other (See Comments)   Codeine Nausea Only   Plavix [Clopidogrel]     brusing all over   Sulfa Antibiotics Nausea Only   Latex Rash     REVIEW OF SYSTEMS (Negative unless checked)  Constitutional: [] Weight loss  [] Fever  [] Chills Cardiac: [] Chest pain   [] Chest pressure   [] Palpitations   [] Shortness of breath when laying flat   [] Shortness of breath with exertion. Vascular:  [x] Pain in legs with walking   [] Pain in legs at rest  [] History of DVT   [] Phlebitis   [] Swelling in legs   [] Varicose veins   [] Non-healing ulcers Pulmonary:   [] Uses home oxygen   [] Productive cough   [] Hemoptysis   [] Wheeze  [x] COPD   [] Asthma Neurologic:  [] Dizziness   [] Seizures   [] History of stroke   [] History of TIA  [] Aphasia   [] Vissual changes   [] Weakness or numbness in arm   [] Weakness or numbness in leg Musculoskeletal:   [] Joint swelling   [x] Joint pain   [] Low back pain Hematologic:  [] Easy bruising  [] Easy bleeding   [] Hypercoagulable state   [] Anemic Gastrointestinal:  [] Diarrhea   [] Vomiting   [x] Gastroesophageal reflux/heartburn   [] Difficulty swallowing. Genitourinary:  [x] Chronic kidney disease   [] Difficult urination  [] Frequent urination   [] Blood in urine Skin:  [] Rashes   [] Ulcers  Psychological:  [] History of anxiety   []  History of major depression.  Physical Examination  There were no vitals filed for this visit. There is no height or weight on file to calculate BMI. Gen: WD/WN, NAD Head: [place]/AT, No temporalis wasting.  Ear/Nose/Throat: Hearing grossly intact, nares w/o erythema or drainage Eyes:  PER, EOMI, sclera nonicteric.  Neck: Supple, no masses.  No bruit or JVD.  Pulmonary:  Good air movement, no audible wheezing, no use of accessory muscles.  Cardiac: RRR, normal S1, S2, no Murmurs. Vascular:  carotid bruit noted Vessel Right Left  Radial Palpable Palpable  Carotid  Palpable  Palpable  Subclav  Palpable Palpable  Gastrointestinal: soft, non-distended. No guarding/no peritoneal signs.  Musculoskeletal: M/S 5/5 throughout.  No visible deformity.  Neurologic: CN 2-12 intact. Pain and light touch intact in extremities.  Symmetrical.  Speech is fluent. Motor exam as listed above. Psychiatric: Judgment intact, Mood & affect appropriate for pt's clinical situation. Dermatologic: No rashes or ulcers noted.  No changes consistent with cellulitis.   CBC Lab Results  Component Value Date   WBC 6.4 01/15/2022   HGB 12.4 01/15/2022   HCT 37.2 01/15/2022   MCV 97 01/15/2022   PLT 195 01/15/2022    BMET    Component Value Date/Time   NA 139 01/15/2022 1413   K 2.7 (L) 01/15/2022 1413   CL 93 (L) 01/15/2022 1413   CO2 30 (H) 01/15/2022 1413   GLUCOSE 117 (H) 01/15/2022 1413   GLUCOSE 142 (H) 09/25/2021 1158   BUN 24 01/15/2022 1413   CREATININE 1.24 (H) 01/15/2022 1413   CALCIUM 9.8 01/15/2022 1413   GFRNONAA 54 (L) 09/25/2021 1158   GFRAA 47 (L) 03/18/2020 1059   CrCl cannot be calculated (Patient's most recent lab result is older than the maximum 21  days allowed.).  COAG Lab Results  Component Value Date   INR 1.1 09/25/2021   INR 1.6 (H) 09/08/2021    Radiology No results found.   Assessment/Plan 1. Atherosclerosis of both carotid arteries Recommend:   Given the patient's asymptomatic subcritical stenosis no further invasive testing or surgery at this time.   Duplex ultrasound shows 1-39% RICA and 40-59% LICA stenosis.  No change compared to previous study.   Continue antiplatelet therapy as prescribed Continue management of CAD, HTN and Hyperlipidemia Healthy heart diet,  encouraged exercise at least 4 times per week Follow up in 12 months with duplex ultrasound and physical exam   - VAS US CAROTID; Future  2. PAD (peripheral artery disease) (HCC) Recommend:   The patient has evidence of atherosclerosis of the lower extremities with claudication.  The patient does not voice lifestyle limiting changes at this point in time.   Noninvasive studies do not suggest clinically significant change.   No invasive studies, angiography or surgery at this time The patient should continue walking and begin a more formal exercise program.  The patient should continue antiplatelet therapy and aggressive treatment of the lipid abnormalities   No changes in the patient's medications at this time   Continued surveillance is indicated as atherosclerosis is likely to progress with time.     The patient will continue follow up with noninvasive studies as ordered.    3. Coronary artery disease involving native coronary artery of native heart without angina pectoris Continue cardiac and antihypertensive medications as already ordered and reviewed, no changes at this time.  Continue statin as ordered and reviewed, no changes at this time  Nitrates PRN for chest pain  4. Primary hypertension Continue antihypertensive medications as already ordered, these medications have been reviewed and there are no changes at this time.  5.  Chronic obstructive pulmonary disease, unspecified COPD type (HCC) Continue pulmonary medications and aerosols as already ordered, these medications have been reviewed and there are no changes  at this time.     Levora Dredge, MD  08/18/2022 3:40 PM

## 2022-08-20 ENCOUNTER — Ambulatory Visit (INDEPENDENT_AMBULATORY_CARE_PROVIDER_SITE_OTHER): Payer: Medicare Other

## 2022-08-20 ENCOUNTER — Ambulatory Visit (INDEPENDENT_AMBULATORY_CARE_PROVIDER_SITE_OTHER): Payer: Medicare Other | Admitting: Vascular Surgery

## 2022-08-20 VITALS — BP 156/76 | HR 69 | Resp 18 | Ht 66.0 in | Wt 160.4 lb

## 2022-08-20 DIAGNOSIS — I2510 Atherosclerotic heart disease of native coronary artery without angina pectoris: Secondary | ICD-10-CM

## 2022-08-20 DIAGNOSIS — I10 Essential (primary) hypertension: Secondary | ICD-10-CM | POA: Diagnosis not present

## 2022-08-20 DIAGNOSIS — I6523 Occlusion and stenosis of bilateral carotid arteries: Secondary | ICD-10-CM

## 2022-08-20 DIAGNOSIS — I739 Peripheral vascular disease, unspecified: Secondary | ICD-10-CM | POA: Diagnosis not present

## 2022-08-20 DIAGNOSIS — J449 Chronic obstructive pulmonary disease, unspecified: Secondary | ICD-10-CM | POA: Diagnosis not present

## 2022-08-28 ENCOUNTER — Encounter (INDEPENDENT_AMBULATORY_CARE_PROVIDER_SITE_OTHER): Payer: Self-pay | Admitting: Vascular Surgery

## 2022-08-30 DIAGNOSIS — M19041 Primary osteoarthritis, right hand: Secondary | ICD-10-CM | POA: Insufficient documentation

## 2022-08-30 DIAGNOSIS — M0609 Rheumatoid arthritis without rheumatoid factor, multiple sites: Secondary | ICD-10-CM | POA: Diagnosis not present

## 2022-08-30 DIAGNOSIS — M19042 Primary osteoarthritis, left hand: Secondary | ICD-10-CM | POA: Diagnosis not present

## 2022-08-30 DIAGNOSIS — Z7960 Long term (current) use of unspecified immunomodulators and immunosuppressants: Secondary | ICD-10-CM | POA: Diagnosis not present

## 2022-09-11 DIAGNOSIS — J90 Pleural effusion, not elsewhere classified: Secondary | ICD-10-CM | POA: Diagnosis not present

## 2022-09-27 ENCOUNTER — Encounter: Payer: Self-pay | Admitting: Family Medicine

## 2022-09-27 ENCOUNTER — Ambulatory Visit (INDEPENDENT_AMBULATORY_CARE_PROVIDER_SITE_OTHER): Payer: Medicare Other | Admitting: Family Medicine

## 2022-09-27 ENCOUNTER — Ambulatory Visit: Payer: Self-pay

## 2022-09-27 VITALS — BP 158/72 | HR 83 | Temp 98.2°F | Resp 16 | Ht 66.0 in | Wt 162.3 lb

## 2022-09-27 DIAGNOSIS — R350 Frequency of micturition: Secondary | ICD-10-CM

## 2022-09-27 DIAGNOSIS — N3091 Cystitis, unspecified with hematuria: Secondary | ICD-10-CM

## 2022-09-27 LAB — POCT URINALYSIS DIPSTICK
Bilirubin, UA: NEGATIVE
Glucose, UA: NEGATIVE
Ketones, UA: NEGATIVE
Nitrite, UA: NEGATIVE
Protein, UA: POSITIVE — AB
Spec Grav, UA: 1.015 (ref 1.010–1.025)
Urobilinogen, UA: 0.2 E.U./dL
pH, UA: 6 (ref 5.0–8.0)

## 2022-09-27 MED ORDER — CEPHALEXIN 500 MG PO CAPS: 500.0000 mg | ORAL_CAPSULE | Freq: Three times a day (TID) | ORAL | 0 refills | Status: AC

## 2022-09-27 NOTE — Telephone Encounter (Signed)
Appt scheduled at 2pm with Dr. Leonard Schwartz.  Pt states that she does not think she can do a Mychart visit.  I advised her that the nurse would send a link and call her and explain everything.  She states that she does not think she can figure that out.  She states that she can not stay out of the bathroom so she can not come in the office.

## 2022-09-27 NOTE — Telephone Encounter (Signed)
Please review and advise.

## 2022-09-27 NOTE — Progress Notes (Signed)
Acute Office Visit  Subjective:     Patient ID: Christina [name], female    DOB: 04-28-1935, 87 y.o.   MRN: [id number]  Chief Complaint  Patient presents with   Urinary Tract Infection   Of note, visit started on the phone and patient opted to finish in person.  I did see her and exam her in person.   Urinary Tract Infection    Discussed the use of AI scribe software for clinical note transcription with the patient, who gave verbal consent to proceed.  History of Present Illness   The patient, with a history of urinary tract infections (UTIs), presents with three days of frequent urination and associated pain. She reports nocturia with occasional need to change clothes due to urinary frequency. The symptoms have significantly impacted her daily activities, preventing her from leaving the house due to the need for frequent bathroom access. The patient describes the current symptoms as similar to previous UTIs.       ROS per HPI      Objective:    BP (!) 158/72 (BP Location: Left Arm, Patient Position: Sitting, Cuff Size: Large)   Pulse 83   Temp 98.2 F (36.8 C) (Temporal)   Resp 16   Ht 5\' 6"  (1.676 m)   Wt 162 lb 4.8 oz (73.6 kg)   SpO2 99%   BMI 26.20 kg/m    Physical Exam Vitals reviewed.  Constitutional:      General: She is not in acute distress.    Appearance: She is well-developed.  HENT:     Head: Normocephalic and atraumatic.  Eyes:     General: No scleral icterus.    Conjunctiva/sclera: Conjunctivae normal.  Cardiovascular:     Rate and Rhythm: Normal rate and regular rhythm.  Pulmonary:     Effort: Pulmonary effort is normal. No respiratory distress.     Breath sounds: Normal breath sounds. No wheezing or rales.  Abdominal:     General: There is no distension.     Palpations: Abdomen is soft.     Tenderness: There is no abdominal tenderness. There is no right CVA tenderness or left CVA tenderness.  Skin:    General: Skin is warm and dry.      Capillary Refill: Capillary refill takes less than 2 seconds.     Findings: No rash.  Neurological:     Mental Status: She is alert and oriented to person, place, and time.  Psychiatric:        Behavior: Behavior normal.     Results for orders placed or performed in visit on 09/27/22  POCT Urinalysis Dipstick  Result Value Ref Range   Color, UA yellow    Clarity, UA clear    Glucose, UA Negative Negative   Bilirubin, UA Negative    Ketones, UA Negative    Spec Grav, UA 1.015 1.010 - 1.025   Blood, UA trace    pH, UA 6.0 5.0 - 8.0   Protein, UA Positive (A) Negative   Urobilinogen, UA 0.2 0.2 or 1.0 E.U./dL   Nitrite, UA Negative    Leukocytes, UA Moderate (2+) (A) Negative   Appearance     Odor          Assessment & Plan:   Problem List Items Addressed This Visit   None Visit Diagnoses     Cystitis with hematuria    -  Primary   Relevant Orders   Urine Culture   POCT Urinalysis Dipstick (Completed)  Urinalysis, microscopic only   Urinary frequency       Relevant Orders   Urine Culture   POCT Urinalysis Dipstick (Completed)   Urinalysis, microscopic only           Urinary Tract Infection (UTI) Frequent urination, nocturia, and dysuria for three days. Urinalysis shows moderate leukocytes and trace blood, consistent with UTI. -Start Keflex 500mg  TID for 5 days. -Collect urine sample for culture and sensitivity to ensure appropriate antibiotic treatment. -Follow up with results of urine culture. - given hematuria, send urine micro and if confirmed, repeat in 6 wks to confirm resolution  General Health Maintenance / Followup Plans -Schedule follow-up appointment with Robynn Pane.        Meds ordered this encounter  Medications   cephALEXin (KEFLEX) 500 MG capsule    Sig: Take 1 capsule (500 mg total) by mouth 3 (three) times daily for 5 days.    Dispense:  15 capsule    Refill:  0    Return in about 4 weeks (around 10/25/2022) for BP f/u.  Shirlee Latch, MD

## 2022-09-27 NOTE — Telephone Encounter (Signed)
Summary: frequent urination   Patient called and stated she is feeling like she is having a UTI, she stays in bathroom can not go anywhere without having frequent urination, pain when urinating as well, requesting medication called in. She stated she can not go into the office. Patient stated through the night she had to change her clothes, she stated it's that bad and she is in no shape to come into the office.   Patients callback # [phone]       Chief Complaint: Urinary frequency - "4 times an hour and I'm wetting myself.I can't come in the office like this" Asking for medication to be sent to pharmacy. Instructed she needs OV. Symptoms: Pain, frequency. Frequency: 3 days ago Pertinent Negatives: Patient denies fever Disposition: [] ED /[] Urgent Care (no appt availability in office) / [] Appointment(In office/virtual)/ []  [place] [facility]/ [] Home Care/ [x] Refused Recommended Disposition /[] [place] [facility]/ []  Follow-up with PCP Additional Notes: Please advise pt.  Reason for Disposition  Urinating more frequently than usual (i.e., frequency)  Answer Assessment - Initial Assessment Questions 1. SYMPTOM: "What's the main symptom you're concerned about?" (e.g., frequency, incontinence)     Frequency 2. ONSET: "When did the    start?"     3 days ago 3. PAIN: "Is there any pain?" If Yes, ask: "How bad is it?" (Scale: 1-10; mild, moderate, severe)     Yes - 9 4. CAUSE: "What do you think is causing the symptoms?"     UTI 5. OTHER SYMPTOMS: "Do you have any other symptoms?" (e.g., blood in urine, fever, flank pain, pain with urination)     Pain 6. PREGNANCY: "Is there any chance you are pregnant?" "When was your last menstrual period?"     No  Protocols used: Urinary Symptoms-A-AH

## 2022-09-28 LAB — URINALYSIS, MICROSCOPIC ONLY
Casts: NONE SEEN /LPF
RBC, Urine: NONE SEEN /HPF (ref 0–2)
WBC, UA: >30 /HPF — AB (ref 0–5)

## 2022-10-01 LAB — URINE CULTURE

## 2022-10-19 ENCOUNTER — Other Ambulatory Visit: Payer: Self-pay | Admitting: Family Medicine

## 2022-10-25 ENCOUNTER — Encounter: Payer: Self-pay | Admitting: Family Medicine

## 2022-10-25 ENCOUNTER — Ambulatory Visit (INDEPENDENT_AMBULATORY_CARE_PROVIDER_SITE_OTHER): Payer: Medicare Other | Admitting: Family Medicine

## 2022-10-25 VITALS — BP 147/84 | HR 77 | Ht 66.0 in | Wt 162.7 lb

## 2022-10-25 DIAGNOSIS — I10 Essential (primary) hypertension: Secondary | ICD-10-CM

## 2022-10-25 DIAGNOSIS — Z23 Encounter for immunization: Secondary | ICD-10-CM | POA: Diagnosis not present

## 2022-10-25 DIAGNOSIS — N390 Urinary tract infection, site not specified: Secondary | ICD-10-CM | POA: Diagnosis not present

## 2022-10-25 NOTE — Progress Notes (Signed)
Established patient visit   Patient: Christina [name]   DOB: Jun 06, 1935   87 y.o. Female  MRN: [id number] Visit Date: 10/25/2022  Today's healthcare provider: Jacky Kindle, FNP  Introduced to nurse practitioner role and practice setting.  All questions answered.  Discussed provider/patient relationship and expectations.  Subjective    HPI HPI     Medical Management of Chronic Issues    Additional comments: Would like to be examined to see if she still has UTI,       Last edited by Rolly Salter, CMA on 10/25/2022  1:55 PM.      Medications: Outpatient Medications Prior to Visit  Medication Sig   acetaminophen (TYLENOL) 500 MG tablet Take 500 mg by mouth every 8 (eight) hours as needed for mild pain or moderate pain.   apixaban (ELIQUIS) 2.5 MG TABS tablet Take by mouth 2 (two) times daily.   Ascorbic Acid (VITAMIN C) 1000 MG tablet Take 1,000 mg by mouth 2 (two) times daily.   azelastine (ASTELIN) 0.1 % nasal spray Place 1 spray into both nostrils daily as needed.   carboxymethylcellulose (REFRESH PLUS) 0.5 % SOLN 1 drop 3 (three) times daily as needed.   diclofenac Sodium (VOLTAREN) 1 % GEL Apply 2 g topically 4 (four) times daily.   folic acid (FOLVITE) 1 MG tablet Take 1 mg by mouth daily.   hydrochlorothiazide (HYDRODIURIL) 25 MG tablet TAKE 1 TABLET (25 MG TOTAL) BY MOUTH DAILY.   ipratropium-albuterol (DUONEB) 0.5-2.5 (3) MG/3ML SOLN Take 3 mLs by nebulization 2 (two) times daily.   methotrexate (RHEUMATREX) 2.5 MG tablet Take by mouth. 8 TABLETS EVERY 7 DAYS   Metoprolol Tartrate 75 MG TABS TAKE 1 TABLET BY MOUTH TWICE A DAY   mineral oil-hydrophilic petrolatum (AQUAPHOR) ointment Apply topically as needed for dry skin.   rosuvastatin (CRESTOR) 20 MG tablet TAKE 1 TABLET BY MOUTH EVERY DAY IN THE EVENING   tobramycin (TOBREX) 0.3 % ophthalmic solution    torsemide (DEMADEX) 20 MG tablet Take 20 mg by mouth daily.   No facility-administered medications prior  to visit.     Objective    BP (!) 147/84 (BP Location: Left Arm, Patient Position: Sitting, Cuff Size: Large)   Pulse 77   Ht 5\' 6"  (1.676 m)   Wt 162 lb 11.2 oz (73.8 kg)   BMI 26.26 kg/m   Physical Exam Vitals and nursing note reviewed.  Constitutional:      General: She is not in acute distress.    Appearance: Normal appearance. She is overweight. She is not ill-appearing, toxic-appearing or diaphoretic.  HENT:     Head: Normocephalic and atraumatic.  Cardiovascular:     Rate and Rhythm: Normal rate and regular rhythm.     Pulses: Normal pulses.     Heart sounds: Normal heart sounds. No murmur heard.    No friction rub. No gallop.  Pulmonary:     Effort: Pulmonary effort is normal. No respiratory distress.     Breath sounds: Normal breath sounds. No stridor. No wheezing, rhonchi or rales.  Chest:     Chest wall: No tenderness.  Abdominal:     General: Bowel sounds are normal.     Palpations: Abdomen is soft.     Tenderness: There is no abdominal tenderness. There is no right CVA tenderness or left CVA tenderness.  Genitourinary:    Comments: Endorses frequency; denies examination Will send Urine for testing for repeat UTI Musculoskeletal:  General: No swelling, tenderness, deformity or signs of injury. Normal range of motion.     Right lower leg: No edema.     Left lower leg: No edema.  Skin:    General: Skin is warm and dry.     Capillary Refill: Capillary refill takes less than 2 seconds.     Coloration: Skin is not jaundiced or pale.     Findings: No bruising, erythema, lesion or rash.  Neurological:     General: No focal deficit present.     Mental Status: She is alert and oriented to person, place, and time. Mental status is at baseline.     Cranial Nerves: No cranial nerve deficit.     Sensory: No sensory deficit.     Motor: No weakness.     Coordination: Coordination normal.  Psychiatric:        Mood and Affect: Mood normal.        Behavior:  Behavior normal.        Thought Content: Thought content normal.        Judgment: Judgment normal.     No results found for any visits on 10/25/22.  Assessment & Plan     Problem List Items Addressed This Visit       Cardiovascular and Mediastinum   Primary hypertension    Chronic, borderline Continue to monitor Pt notes some SOB following walking in due to chronic pulm and resp concerns Defer medication changes given age and focus on diet/exercise to assist Remains on hydrochlorothiazide 25, torsemide 20 but patient using prn, metop 75 mg bid        Genitourinary   Recurrent UTI - Primary    Request for confirmation testing; symptoms unchanged; continues to note frequency  No blood on eye visualization of urine sample prior to lab sampling       Relevant Orders   Urinalysis, Routine w reflex microscopic   Urine Culture     Other   Immunization due   Relevant Orders   Flu Vaccine Trivalent High Dose (Fluad) (Completed)   Influenza vaccine needed   Relevant Orders   Flu Vaccine Trivalent High Dose (Fluad) (Completed)   No follow-ups on file.     Leilani Merl, FNP, have reviewed all documentation for this visit. The documentation on 10/25/22 for the exam, diagnosis, procedures, and orders are all accurate and complete.  Jacky Kindle, FNP  [facility] [phone] (phone) [phone] (fax)  [facility]

## 2022-10-25 NOTE — Assessment & Plan Note (Signed)
Request for confirmation testing; symptoms unchanged; continues to note frequency  No blood on eye visualization of urine sample prior to lab sampling

## 2022-10-25 NOTE — Assessment & Plan Note (Signed)
Chronic, borderline Continue to monitor Pt notes some SOB following walking in due to chronic pulm and resp concerns Defer medication changes given age and focus on diet/exercise to assist Remains on hydrochlorothiazide 25, torsemide 20 but patient using prn, metop 75 mg bid

## 2022-10-26 LAB — MICROSCOPIC EXAMINATION
Bacteria, UA: NONE SEEN
Casts: NONE SEEN /lpf
RBC, Urine: NONE SEEN /hpf (ref 0–2)

## 2022-10-26 LAB — URINALYSIS, ROUTINE W REFLEX MICROSCOPIC
Bilirubin, UA: NEGATIVE
Glucose, UA: NEGATIVE
Ketones, UA: NEGATIVE
Nitrite, UA: NEGATIVE
RBC, UA: NEGATIVE
Specific Gravity, UA: 1.015 (ref 1.005–1.030)
Urobilinogen, Ur: 1 mg/dL (ref 0.2–1.0)
pH, UA: 6 (ref 5.0–7.5)

## 2022-10-26 NOTE — Progress Notes (Signed)
Awaiting urine culture results given improved urinalysis; however, presence of infection cells remains.

## 2022-10-30 ENCOUNTER — Other Ambulatory Visit: Payer: Self-pay | Admitting: Family Medicine

## 2022-10-30 MED ORDER — CIPROFLOXACIN HCL 250 MG PO TABS: 250.0000 mg | ORAL_TABLET | Freq: Two times a day (BID) | ORAL | 0 refills | Status: AC

## 2022-10-30 NOTE — Progress Notes (Signed)
Abx called in, Cipro 250 x 6 doses, 3 days every 12 hours. Enterococcus faecalis seen on Cx.

## 2022-10-31 LAB — URINE CULTURE

## 2022-11-06 DIAGNOSIS — H353221 Exudative age-related macular degeneration, left eye, with active choroidal neovascularization: Secondary | ICD-10-CM | POA: Diagnosis not present

## 2022-11-06 DIAGNOSIS — H35432 Paving stone degeneration of retina, left eye: Secondary | ICD-10-CM | POA: Diagnosis not present

## 2022-11-06 DIAGNOSIS — H353213 Exudative age-related macular degeneration, right eye, with inactive scar: Secondary | ICD-10-CM | POA: Diagnosis not present

## 2022-11-06 DIAGNOSIS — H04123 Dry eye syndrome of bilateral lacrimal glands: Secondary | ICD-10-CM | POA: Diagnosis not present

## 2022-11-06 DIAGNOSIS — H43813 Vitreous degeneration, bilateral: Secondary | ICD-10-CM | POA: Diagnosis not present

## 2022-11-06 DIAGNOSIS — H26491 Other secondary cataract, right eye: Secondary | ICD-10-CM | POA: Diagnosis not present

## 2022-12-21 ENCOUNTER — Other Ambulatory Visit: Payer: Self-pay | Admitting: Family Medicine

## 2022-12-21 DIAGNOSIS — I10 Essential (primary) hypertension: Secondary | ICD-10-CM

## 2022-12-21 NOTE — Telephone Encounter (Signed)
Requested by interface surescripts. Future visit in 6 days .  Requested Prescriptions  Pending Prescriptions Disp Refills   Metoprolol Tartrate 75 MG TABS [Pharmacy Med Name: METOPROLOL TARTRATE 75 MG TAB] 180 tablet 0    Sig: TAKE 1 TABLET BY MOUTH TWICE A DAY     Cardiovascular:  Beta Blockers Failed - 12/21/2022 10:14 AM      Failed - Last BP in normal range    BP Readings from Last 1 Encounters:  10/25/22 (!) 147/84         Passed - Last Heart Rate in normal range    Pulse Readings from Last 1 Encounters:  10/25/22 77         Passed - Valid encounter within last 6 months    Recent Outpatient Visits           1 month ago Recurrent UTI   [facility] Merita Norton T, FNP   2 months ago Cystitis with hematuria   [facility] Beryle Flock, Marzella Schlein, MD   11 months ago Weight loss, unintentional   [facility] Merita Norton T, FNP   12 months ago PAD (peripheral artery disease) [facility])   [facility] Merita Norton T, FNP   1 year ago Cellulitis of left lower extremity   [facility] Jacky Kindle, FNP       Future Appointments             In 6 days Jacky Kindle, FNP [facility]

## 2022-12-26 ENCOUNTER — Ambulatory Visit: Payer: Medicare Other

## 2022-12-27 ENCOUNTER — Ambulatory Visit: Payer: Medicare Other | Admitting: Family Medicine

## 2022-12-27 ENCOUNTER — Encounter: Payer: Self-pay | Admitting: Family Medicine

## 2022-12-27 VITALS — BP 169/75 | HR 61 | Resp 16 | Ht 66.0 in | Wt 162.3 lb

## 2022-12-27 DIAGNOSIS — I480 Paroxysmal atrial fibrillation: Secondary | ICD-10-CM

## 2022-12-27 DIAGNOSIS — E038 Other specified hypothyroidism: Secondary | ICD-10-CM

## 2022-12-27 DIAGNOSIS — M0609 Rheumatoid arthritis without rheumatoid factor, multiple sites: Secondary | ICD-10-CM

## 2022-12-27 DIAGNOSIS — Z0000 Encounter for general adult medical examination without abnormal findings: Secondary | ICD-10-CM | POA: Diagnosis not present

## 2022-12-27 DIAGNOSIS — J449 Chronic obstructive pulmonary disease, unspecified: Secondary | ICD-10-CM | POA: Diagnosis not present

## 2022-12-27 DIAGNOSIS — I5032 Chronic diastolic (congestive) heart failure: Secondary | ICD-10-CM | POA: Diagnosis not present

## 2022-12-27 DIAGNOSIS — Z0001 Encounter for general adult medical examination with abnormal findings: Secondary | ICD-10-CM | POA: Diagnosis not present

## 2022-12-27 DIAGNOSIS — F4323 Adjustment disorder with mixed anxiety and depressed mood: Secondary | ICD-10-CM

## 2022-12-27 DIAGNOSIS — N1831 Chronic kidney disease, stage 3a: Secondary | ICD-10-CM

## 2022-12-27 NOTE — Progress Notes (Signed)
Annual Wellness Visit  Patient: Christina [name], Female    DOB: 1935-12-06, 87 y.o.   MRN: [id number] Visit Date: 12/27/2022  Today's Provider: Jacky Kindle, FNP  Re Introduced to nurse practitioner role and practice setting.  All questions answered.  Discussed provider/patient relationship and expectations.  Chief Complaint  Patient presents with   Medicare Wellness   Annual Exam   Subjective    Christina [name] is a 87 y.o. female who presents today for her Annual Wellness Visit.  She reports consuming a general diet. The patient does not participate in regular exercise at present. She generally feels fairly well. She reports sleeping fairly well. She does have additional problems to discuss today.   HPI  The patient, with a history of heart disease and lung disease, presents with concerns about weight gain and shortness of breath. She has noticed an increase in her abdominal girth and is unsure if this is due to normal weight gain or fluid retention. She does not weigh herself daily. She also reports shortness of breath, particularly when performing household chores, and has to rest frequently. She uses a nebulizer twice daily as prescribed, but sometimes feels the need to use an inhaler.  The patient also has arthritis, which affects her ability to use her fingers and perform tasks such as cooking. She has seen an arthritis doctor in the past but did not notice any improvement.  The patient is also dealing with stress and worry related to family issues, including her husband's health problems and her son's employment and health issues. She has a history of depression, and her depression screening today was elevated.  The patient is also considering changing her heart specialist and would like to stay within the same medical center if possible. She does not have any specific concerns about her current care, but would like to have a specialist on standby.  She further  notes that if she bends over, she feels dizzy and has an upcoming appointment   Medications: Outpatient Medications Prior to Visit  Medication Sig   acetaminophen (TYLENOL) 500 MG tablet Take 500 mg by mouth every 8 (eight) hours as needed for mild pain or moderate pain.   apixaban (ELIQUIS) 2.5 MG TABS tablet Take by mouth 2 (two) times daily.   Ascorbic Acid (VITAMIN C) 1000 MG tablet Take 1,000 mg by mouth 2 (two) times daily.   azelastine (ASTELIN) 0.1 % nasal spray Place 1 spray into both nostrils daily as needed.   carboxymethylcellulose (REFRESH PLUS) 0.5 % SOLN 1 drop 3 (three) times daily as needed.   diclofenac Sodium (VOLTAREN) 1 % GEL Apply 2 g topically 4 (four) times daily.   hydrochlorothiazide (HYDRODIURIL) 25 MG tablet TAKE 1 TABLET (25 MG TOTAL) BY MOUTH DAILY.   ipratropium-albuterol (DUONEB) 0.5-2.5 (3) MG/3ML SOLN Take 3 mLs by nebulization 2 (two) times daily.   Metoprolol Tartrate 75 MG TABS TAKE 1 TABLET BY MOUTH TWICE A DAY   prednisoLONE acetate (PRED FORTE) 1 % ophthalmic suspension Place 1 drop into both eyes every 2 (two) hours while awake.   predniSONE (DELTASONE) 5 MG tablet Take 5 mg by mouth daily.   rosuvastatin (CRESTOR) 20 MG tablet TAKE 1 TABLET BY MOUTH EVERY DAY IN THE EVENING   tobramycin (TOBREX) 0.3 % ophthalmic solution    torsemide (DEMADEX) 20 MG tablet Take 20 mg by mouth daily.   [DISCONTINUED] mineral oil-hydrophilic petrolatum (AQUAPHOR) ointment Apply topically as needed for dry skin.   [  DISCONTINUED] folic acid (FOLVITE) 1 MG tablet Take 1 mg by mouth daily.   [DISCONTINUED] methotrexate (RHEUMATREX) 2.5 MG tablet Take by mouth. 8 TABLETS EVERY 7 DAYS   No facility-administered medications prior to visit.    Allergies  Allergen Reactions   Baclofen Other (See Comments)   Codeine Nausea Only   Plavix [Clopidogrel]     brusing all over   Sulfa Antibiotics Nausea Only   Latex Rash    Patient Care Team: Jacky Kindle, FNP as PCP -  General (Family Medicine) Nickolas Madrid as Consulting Physician (Pulmonary Disease) Stephannie Li, MD as Consulting Physician (Ophthalmology)  Review of Systems  Last CBC Lab Results  Component Value Date   WBC 5.4 12/27/2022   HGB 13.2 12/27/2022   HCT 40.0 12/27/2022   MCV 106 (H) 12/27/2022   MCH 35.1 (H) 12/27/2022   RDW 12.1 12/27/2022   PLT 125 (L) 12/27/2022   Last metabolic panel Lab Results  Component Value Date   GLUCOSE 90 12/27/2022   NA 139 12/27/2022   K 4.5 12/27/2022   CL 104 12/27/2022   CO2 17 (L) 12/27/2022   BUN 25 12/27/2022   CREATININE 1.27 (H) 12/27/2022   EGFR 41 (L) 12/27/2022   CALCIUM 9.0 12/27/2022   PHOS 3.2 09/25/2021   PROT 6.7 12/27/2022   ALBUMIN 4.2 12/27/2022   LABGLOB 2.5 12/27/2022   AGRATIO 2.3 (H) 03/18/2020   BILITOT 1.2 12/27/2022   ALKPHOS 169 (H) 12/27/2022   AST 30 12/27/2022   ALT 17 12/27/2022   ANIONGAP 10 09/25/2021   Last lipids Lab Results  Component Value Date   CHOL 118 12/27/2022   HDL 46 12/27/2022   LDLCALC 56 12/27/2022   TRIG 79 12/27/2022   CHOLHDL 2.6 12/27/2022   Last hemoglobin A1c Lab Results  Component Value Date   HGBA1C 6.3 (H) 01/15/2022   Last thyroid functions Lab Results  Component Value Date   TSH 2.270 12/27/2022   Last vitamin D Lab Results  Component Value Date   VD25OH 44.9 01/15/2022   Last vitamin B12 and Folate Lab Results  Component Value Date   VITAMINB12 814 01/15/2022   FOLATE >20.0 01/15/2022     Objective    Vitals: BP (!) 169/75 (BP Location: Left Arm, Patient Position: Sitting, Cuff Size: Normal)   Pulse 61   Resp 16   Ht 5\' 6"  (1.676 m)   Wt 162 lb 4.8 oz (73.6 kg)   SpO2 100%   BMI 26.20 kg/m   BP Readings from Last 3 Encounters:  12/27/22 (!) 169/75  10/25/22 (!) 147/84  09/27/22 (!) 158/72   Wt Readings from Last 3 Encounters:  12/27/22 162 lb 4.8 oz (73.6 kg)  10/25/22 162 lb 11.2 oz (73.8 kg)  09/27/22 162 lb 4.8 oz (73.6 kg)   SpO2  Readings from Last 3 Encounters:  12/27/22 100%  09/27/22 99%  01/15/22 97%   Physical Exam Vitals and nursing note reviewed.  Constitutional:      General: She is awake. She is not in acute distress.    Appearance: Normal appearance. She is well-developed, well-groomed and overweight. She is not ill-appearing, toxic-appearing or diaphoretic.  HENT:     Head: Normocephalic and atraumatic.     Jaw: There is normal jaw occlusion. No trismus, tenderness, swelling or pain on movement.     Right Ear: Hearing, tympanic membrane, ear canal and external ear normal. There is no impacted cerumen.     Left Ear:  Hearing, tympanic membrane, ear canal and external ear normal. There is no impacted cerumen.     Nose: Nose normal. No congestion or rhinorrhea.     Right Turbinates: Not enlarged, swollen or pale.     Left Turbinates: Not enlarged, swollen or pale.     Right Sinus: No maxillary sinus tenderness or frontal sinus tenderness.     Left Sinus: No maxillary sinus tenderness or frontal sinus tenderness.     Mouth/Throat:     Lips: Pink.     Mouth: Mucous membranes are moist. No injury.     Tongue: No lesions.     Pharynx: Oropharynx is clear. Uvula midline. No pharyngeal swelling, oropharyngeal exudate, posterior oropharyngeal erythema or uvula swelling.     Tonsils: No tonsillar exudate or tonsillar abscesses.  Eyes:     General: Lids are normal. Lids are everted, no foreign bodies appreciated. Vision grossly intact. Gaze aligned appropriately. No allergic shiner or visual field deficit.       Right eye: No discharge.        Left eye: No discharge.     Extraocular Movements: Extraocular movements intact.     Conjunctiva/sclera: Conjunctivae normal.     Right eye: Right conjunctiva is not injected. No exudate.    Left eye: Left conjunctiva is not injected. No exudate.    Pupils: Pupils are equal, round, and reactive to light.  Neck:     Thyroid: No thyroid mass, thyromegaly or thyroid  tenderness.     Vascular: No carotid bruit.     Trachea: Trachea normal.  Cardiovascular:     Rate and Rhythm: Normal rate and regular rhythm.     Pulses: Normal pulses.          Carotid pulses are 2+ on the right side and 2+ on the left side.      Radial pulses are 2+ on the right side and 2+ on the left side.       Dorsalis pedis pulses are 2+ on the right side and 2+ on the left side.       Posterior tibial pulses are 2+ on the right side and 2+ on the left side.     Heart sounds: Normal heart sounds, S1 normal and S2 normal. No murmur heard.    No friction rub. No gallop.  Pulmonary:     Effort: Pulmonary effort is normal. No respiratory distress.     Breath sounds: Normal breath sounds and air entry. No stridor. No wheezing, rhonchi or rales.  Chest:     Chest wall: No tenderness.  Abdominal:     General: Abdomen is flat. Bowel sounds are normal. There is no distension.     Palpations: Abdomen is soft. There is no mass.     Tenderness: There is no abdominal tenderness. There is no right CVA tenderness, left CVA tenderness, guarding or rebound.     Hernia: No hernia is present.  Genitourinary:    Comments: Exam deferred; denies complaints Musculoskeletal:        General: No swelling, tenderness, deformity or signs of injury. Normal range of motion.     Cervical back: Full passive range of motion without pain, normal range of motion and neck supple. No edema, rigidity or tenderness. No muscular tenderness.     Right lower leg: No edema.     Left lower leg: No edema.  Lymphadenopathy:     Cervical: No cervical adenopathy.     Right cervical: No superficial, deep  or posterior cervical adenopathy.    Left cervical: No superficial, deep or posterior cervical adenopathy.  Skin:    General: Skin is warm and dry.     Capillary Refill: Capillary refill takes less than 2 seconds.     Coloration: Skin is not jaundiced or pale.     Findings: No bruising, erythema, lesion or rash.   Neurological:     General: No focal deficit present.     Mental Status: She is alert and oriented to person, place, and time. Mental status is at baseline.     GCS: GCS eye subscore is 4. GCS verbal subscore is 5. GCS motor subscore is 6.     Sensory: Sensation is intact. No sensory deficit.     Motor: Motor function is intact. No weakness.     Coordination: Coordination is intact. Coordination normal.     Gait: Gait is intact. Gait normal.  Psychiatric:        Attention and Perception: Attention and perception normal.        Mood and Affect: Mood and affect normal.        Speech: Speech normal.        Behavior: Behavior normal. Behavior is cooperative.        Thought Content: Thought content normal.        Cognition and Memory: Cognition and memory normal.        Judgment: Judgment normal.    Most recent functional status assessment:    12/27/2022    2:05 PM  In your present state of health, do you have any difficulty performing the following activities:  Hearing? 0  Vision? 1  Difficulty concentrating or making decisions? 0  Walking or climbing stairs? 1  Dressing or bathing? 1  Doing errands, shopping? 0   Most recent fall risk assessment:    12/27/2022    2:05 PM  Fall Risk   Falls in the past year? 0  Number falls in past yr: 0  Injury with Fall? 0  Risk for fall due to : No Fall Risks    Most recent depression screenings:    12/27/2022    1:53 PM 12/15/2021   11:08 AM  PHQ 2/9 Scores  PHQ - 2 Score 3 4  PHQ- 9 Score 10 8   Most recent cognitive screening:    03/18/2020   10:03 AM  6CIT Screen  What Year? 0 points  What month? 0 points  What time? 0 points  Count back from 20 0 points  Months in reverse 0 points  Repeat phrase 2 points  Total Score 2 points   Most recent Audit-C alcohol use screening    01/15/2022    2:00 PM  Alcohol Use Disorder Test (AUDIT)  1. How often do you have a drink containing alcohol? 0  2. How many drinks  containing alcohol do you have on a typical day when you are drinking? 0  3. How often do you have six or more drinks on one occasion? 0  AUDIT-C Score 0   A score of 3 or more in women, and 4 or more in men indicates increased risk for alcohol abuse, EXCEPT if all of the points are from question 1   Results for orders placed or performed in visit on 12/27/22  Comprehensive metabolic panel  Result Value Ref Range   Glucose 90 70 - 99 mg/dL   BUN 25 8 - 27 mg/dL   Creatinine, Ser 2.95 (H) 0.57 -  1.00 mg/dL   eGFR 41 (L) >16 XW/RUE/4.54   BUN/Creatinine Ratio 20 12 - 28   Sodium 139 134 - 144 mmol/L   Potassium 4.5 3.5 - 5.2 mmol/L   Chloride 104 96 - 106 mmol/L   CO2 17 (L) 20 - 29 mmol/L   Calcium 9.0 8.7 - 10.3 mg/dL   Total Protein 6.7 6.0 - 8.5 g/dL   Albumin 4.2 3.7 - 4.7 g/dL   Globulin, Total 2.5 1.5 - 4.5 g/dL   Bilirubin Total 1.2 0.0 - 1.2 mg/dL   Alkaline Phosphatase 169 (H) 44 - 121 IU/L   AST 30 0 - 40 IU/L   ALT 17 0 - 32 IU/L  CBC with Differential/Platelet  Result Value Ref Range   WBC 5.4 3.4 - 10.8 x10E3/uL   RBC 3.76 (L) 3.77 - 5.28 x10E6/uL   Hemoglobin 13.2 11.1 - 15.9 g/dL   Hematocrit 09.8 11.9 - 46.6 %   MCV 106 (H) 79 - 97 fL   MCH 35.1 (H) 26.6 - 33.0 pg   MCHC 33.0 31.5 - 35.7 g/dL   RDW 14.7 82.9 - 56.2 %   Platelets 125 (L) 150 - 450 x10E3/uL   Neutrophils 66 Not Estab. %   Lymphs 22 Not Estab. %   Monocytes 8 Not Estab. %   Eos 4 Not Estab. %   Basos 0 Not Estab. %   Neutrophils Absolute 3.5 1.4 - 7.0 x10E3/uL   Lymphocytes Absolute 1.2 0.7 - 3.1 x10E3/uL   Monocytes Absolute 0.4 0.1 - 0.9 x10E3/uL   EOS (ABSOLUTE) 0.2 0.0 - 0.4 x10E3/uL   Basophils Absolute 0.0 0.0 - 0.2 x10E3/uL   Immature Granulocytes 0 Not Estab. %   Immature Grans (Abs) 0.0 0.0 - 0.1 x10E3/uL  Lipid panel  Result Value Ref Range   Cholesterol, Total 118 100 - 199 mg/dL   Triglycerides 79 0 - 149 mg/dL   HDL 46 >13 mg/dL   VLDL Cholesterol Cal 16 5 - 40 mg/dL    LDL Chol Calc (NIH) 56 0 - 99 mg/dL   Chol/HDL Ratio 2.6 0.0 - 4.4 ratio  TSH  Result Value Ref Range   TSH 2.270 0.450 - 4.500 uIU/mL    Assessment & Plan     Annual wellness visit done today including the all of the following: Reviewed patient's Family Medical History Reviewed and updated list of patient's medical providers Assessment of cognitive impairment was done Assessed patient's functional ability Established a written schedule for health screening services Health Risk Assessent Completed and Reviewed  Exercise Activities and Dietary recommendations  Goals      Exercise 150 minutes per week (moderate activity)     Track and Manage My Triggers-COPD     Timeframe:  Long-Range Goal Priority:  High Start Date: 04/03/2021                           Expected End Date: 04/03/2022                      Follow Up within 30 days   - eliminate smoking in my home - identify and remove indoor air pollutants - limit outdoor activity during cold weather    Why is this important?   Triggers are activities or things, like tobacco smoke or cold weather, that make your COPD (chronic obstructive pulmonary disease) flare-up.  Knowing these triggers helps you plan how to stay away from them.  When you cannot remove them, you can learn how to manage them.     Notes:         Immunization History  Administered Date(s) Administered   Fluad Quad(high Dose 65+) 10/20/2018, 10/21/2019, 11/03/2020, 11/08/2021   Fluad Trivalent(High Dose 65+) 10/25/2022   Influenza Split 12/01/2005, 11/17/2007   Influenza, High Dose Seasonal PF 10/25/2016, 10/21/2019   Influenza-Unspecified 10/06/2013, 10/07/2015, 10/25/2016, 10/28/2017   PFIZER(Purple Top)SARS-COV-2 Vaccination 02/27/2019, 03/20/2019, 11/09/2019   Pneumococcal Conjugate-13 12/28/2013   Pneumococcal Polysaccharide-23 05/31/2003   Zoster, Live 03/17/2008    Health Maintenance  Topic Date Due   DTaP/Tdap/Td (1 - Tdap) Never done    Zoster Vaccines- Shingrix (1 of 2) 10/16/1987   DEXA SCAN  01/12/2018   COVID-19 Vaccine (4 - 2023-24 season) 10/07/2022   Medicare Annual Wellness (AWV)  12/30/2023   Pneumonia Vaccine 13+ Years old  Completed   INFLUENZA VACCINE  Completed   HPV VACCINES  Aged Out     Discussed health benefits of physical activity, and encouraged her to engage in regular exercise appropriate for her age and condition.    Problem List Items Addressed This Visit       Cardiovascular and Mediastinum   Chronic heart failure with preserved ejection fraction (HCC)   Relevant Orders   Comprehensive metabolic panel (Completed)   CBC with Differential/Platelet (Completed)   Ambulatory referral to Cardiology   AMB referral to CHF clinic   Lipid panel (Completed)   TSH (Completed)   Paroxysmal atrial fibrillation (HCC)   Relevant Orders   Comprehensive metabolic panel (Completed)   CBC with Differential/Platelet (Completed)   Ambulatory referral to Cardiology   AMB referral to CHF clinic   Lipid panel (Completed)   TSH (Completed)     Respiratory   COPD (chronic obstructive pulmonary disease) (HCC)   Relevant Medications   predniSONE (DELTASONE) 5 MG tablet   Other Relevant Orders   Comprehensive metabolic panel (Completed)   CBC with Differential/Platelet (Completed)   Ambulatory referral to Cardiology   AMB referral to CHF clinic   Lipid panel (Completed)   TSH (Completed)     Endocrine   Subclinical hypothyroidism   Relevant Orders   Comprehensive metabolic panel (Completed)   CBC with Differential/Platelet (Completed)   Ambulatory referral to Cardiology   AMB referral to CHF clinic   Lipid panel (Completed)   TSH (Completed)     Musculoskeletal and Integument   Rheumatoid arthritis of multiple sites with negative rheumatoid factor (HCC)   Relevant Medications   predniSONE (DELTASONE) 5 MG tablet     Genitourinary   Stage 3a chronic kidney disease (HCC)   Relevant Orders    Comprehensive metabolic panel (Completed)   CBC with Differential/Platelet (Completed)   Ambulatory referral to Cardiology   AMB referral to CHF clinic   Lipid panel (Completed)   TSH (Completed)     Other   Adjustment disorder with mixed anxiety and depressed mood   Annual physical exam   Relevant Orders   Comprehensive metabolic panel (Completed)   CBC with Differential/Platelet (Completed)   Ambulatory referral to Cardiology   AMB referral to CHF clinic   Lipid panel (Completed)   TSH (Completed)   Encounter for subsequent annual wellness visit (AWV) in Medicare patient - Primary  Cardiology Care Patient expressed desire to change cardiologists. No specific concerns mentioned. -Referral to a new cardiologist within [facility].  Weight Gain Patient reported weight gain and increased abdominal girth since surgery a year ago. No  daily weight monitoring. Possibility of fluid retention discussed. -Advise daily weight monitoring. -Consider use of Torasemide if weight gain is due to fluid retention.  Respiratory Symptoms Patient reported using nebulizer twice daily and occasional use of inhaler. Experiences shortness of breath with exertion. -Continue current respiratory medications.  Arthritis Patient reported difficulty with hand function due to arthritis. Previous rheumatology care did not result in improvement. -No specific plan discussed.  Depression Patient reported increased stress and worry due to family issues. -Continue to monitor.  Hypertension Elevated blood pressure noted during visit. Patient does not regularly monitor blood pressure at home. -Advise regular home blood pressure monitoring.  Medication Refills Patient requested medication refills until established with new cardiologist. -Refill current medications.  Lab Work Patient requested lab work, including potassium level. -Order lab work including potassium level.  Follow-up Plan for six-month follow-up  appointment.  Return in about 6 months (around 06/26/2023) for chonic disease management.    Leilani Merl, FNP, have reviewed all documentation for this visit. The documentation on 12/30/22 for the exam, diagnosis, procedures, and orders are all accurate and complete.  Jacky Kindle, FNP  [facility] [phone] (phone) [phone] (fax)  [facility]

## 2022-12-28 ENCOUNTER — Ambulatory Visit: Payer: Self-pay

## 2022-12-28 ENCOUNTER — Encounter: Payer: Self-pay | Admitting: Family Medicine

## 2022-12-28 LAB — CBC WITH DIFFERENTIAL/PLATELET
Basophils Absolute: 0 10*3/uL (ref 0.0–0.2)
Basos: 0 %
EOS (ABSOLUTE): 0.2 10*3/uL (ref 0.0–0.4)
Eos: 4 %
Hematocrit: 40 % (ref 34.0–46.6)
Hemoglobin: 13.2 g/dL (ref 11.1–15.9)
Immature Grans (Abs): 0 10*3/uL (ref 0.0–0.1)
Immature Granulocytes: 0 %
Lymphocytes Absolute: 1.2 10*3/uL (ref 0.7–3.1)
Lymphs: 22 %
MCH: 35.1 pg — ABNORMAL HIGH (ref 26.6–33.0)
MCHC: 33 g/dL (ref 31.5–35.7)
MCV: 106 fL — ABNORMAL HIGH (ref 79–97)
Monocytes Absolute: 0.4 10*3/uL (ref 0.1–0.9)
Monocytes: 8 %
Neutrophils Absolute: 3.5 10*3/uL (ref 1.4–7.0)
Neutrophils: 66 %
Platelets: 125 10*3/uL — ABNORMAL LOW (ref 150–450)
RBC: 3.76 x10E6/uL — ABNORMAL LOW (ref 3.77–5.28)
RDW: 12.1 % (ref 11.7–15.4)
WBC: 5.4 10*3/uL (ref 3.4–10.8)

## 2022-12-28 LAB — COMPREHENSIVE METABOLIC PANEL
ALT: 17 [IU]/L (ref 0–32)
AST: 30 [IU]/L (ref 0–40)
Albumin: 4.2 g/dL (ref 3.7–4.7)
Alkaline Phosphatase: 169 [IU]/L — ABNORMAL HIGH (ref 44–121)
BUN/Creatinine Ratio: 20 (ref 12–28)
BUN: 25 mg/dL (ref 8–27)
Bilirubin Total: 1.2 mg/dL (ref 0.0–1.2)
CO2: 17 mmol/L — ABNORMAL LOW (ref 20–29)
Calcium: 9 mg/dL (ref 8.7–10.3)
Chloride: 104 mmol/L (ref 96–106)
Creatinine, Ser: 1.27 mg/dL — ABNORMAL HIGH (ref 0.57–1.00)
Globulin, Total: 2.5 g/dL (ref 1.5–4.5)
Glucose: 90 mg/dL (ref 70–99)
Potassium: 4.5 mmol/L (ref 3.5–5.2)
Sodium: 139 mmol/L (ref 134–144)
Total Protein: 6.7 g/dL (ref 6.0–8.5)
eGFR: 41 mL/min/{1.73_m2} — ABNORMAL LOW (ref >59)

## 2022-12-28 LAB — LIPID PANEL
Chol/HDL Ratio: 2.6 ratio (ref 0.0–4.4)
Cholesterol, Total: 118 mg/dL (ref 100–199)
HDL: 46 mg/dL (ref >39)
LDL Chol Calc (NIH): 56 mg/dL (ref 0–99)
Triglycerides: 79 mg/dL (ref 0–149)
VLDL Cholesterol Cal: 16 mg/dL (ref 5–40)

## 2022-12-28 LAB — TSH: TSH: 2.27 u[IU]/mL (ref 0.450–4.500)

## 2022-12-28 NOTE — Telephone Encounter (Signed)
  Chief Complaint: Dizziness Symptoms: dizziness Frequency: 1 months Pertinent Negatives: Patient denies any other s/s Disposition: [] ED /[] Urgent Care (no appt availability in office) / [x] Appointment(In office/virtual)/ []  [facility]/ [] Home Care/ [] Refused Recommended Disposition /[] [facility]/ []  Follow-up with PCP Additional Notes: Pt called for lab results - Shared provider's note.   Jacky Kindle, FNP 12/28/2022  9:30 AM EST     OK to print/mail:   Chronic, stable creatinine elevation   All other labs stable.   Pt then stated that she is having daily dizziness spells, where she needs to grab on to something in order not to fall. Pt forgot to tell provider at yesterday's OV. This has been going on for about 1 month and seems to be getting worse. She bent over today and became very dizzy.  Appt made.    Reason for Disposition  [1] MODERATE dizziness (e.g., interferes with normal activities) AND [2] has been evaluated by doctor (or NP/PA) for this  Answer Assessment - Initial Assessment Questions 1. DESCRIPTION: "Describe your dizziness."     Feel like she is going to fall 2. LIGHTHEADED: "Do you feel lightheaded?" (e.g., somewhat faint, woozy, weak upon standing)     Lightheaded 3. VERTIGO: "Do you feel like either you or the room is spinning or tilting?" (i.e. vertigo)     no 4. SEVERITY: "How bad is it?"  "Do you feel like you are going to faint?" "Can you stand and walk?"   - MILD: Feels slightly dizzy, but walking normally.   - MODERATE: Feels unsteady when walking, but not falling; interferes with normal activities (e.g., school, work).   - SEVERE: Unable to walk without falling, or requires assistance to walk without falling; feels like passing out now.      Moderate - needs to sit down 5. ONSET:  "When did the dizziness begin?"     1 month 6. AGGRAVATING FACTORS: "Does anything make it worse?" (e.g., standing, change in head position)      Bending over 8. CAUSE: "What do you think is causing the dizziness?"     BP medication 9. RECURRENT SYMPTOM: "Have you had dizziness before?" If Yes, ask: "When was the last time?" "What happened that time?"     About everyday 10. OTHER SYMPTOMS: "Do you have any other symptoms?" (e.g., fever, chest pain, vomiting, diarrhea, bleeding)       no  Protocols used: Dizziness - Lightheadedness-A-AH

## 2022-12-28 NOTE — Progress Notes (Signed)
OK to print/mail:  Chronic, stable creatinine elevation  All other labs stable.

## 2022-12-30 DIAGNOSIS — I480 Paroxysmal atrial fibrillation: Secondary | ICD-10-CM | POA: Insufficient documentation

## 2022-12-30 DIAGNOSIS — I5032 Chronic diastolic (congestive) heart failure: Secondary | ICD-10-CM | POA: Insufficient documentation

## 2022-12-30 DIAGNOSIS — F4323 Adjustment disorder with mixed anxiety and depressed mood: Secondary | ICD-10-CM | POA: Insufficient documentation

## 2022-12-30 DIAGNOSIS — Z0000 Encounter for general adult medical examination without abnormal findings: Secondary | ICD-10-CM | POA: Insufficient documentation

## 2022-12-30 DIAGNOSIS — E038 Other specified hypothyroidism: Secondary | ICD-10-CM | POA: Insufficient documentation

## 2023-01-02 ENCOUNTER — Ambulatory Visit (INDEPENDENT_AMBULATORY_CARE_PROVIDER_SITE_OTHER): Payer: Medicare Other | Admitting: Family Medicine

## 2023-01-02 ENCOUNTER — Encounter: Payer: Self-pay | Admitting: Family Medicine

## 2023-01-02 VITALS — BP 174/84 | HR 76 | Resp 14 | Ht 66.0 in | Wt 159.6 lb

## 2023-01-02 DIAGNOSIS — R42 Dizziness and giddiness: Secondary | ICD-10-CM | POA: Insufficient documentation

## 2023-01-02 DIAGNOSIS — F439 Reaction to severe stress, unspecified: Secondary | ICD-10-CM | POA: Diagnosis not present

## 2023-01-02 NOTE — Progress Notes (Addendum)
Acute Office Visit  Introduced to nurse practitioner role and practice setting.  All questions answered.  Discussed provider/patient relationship and expectations.   Subjective:     Patient ID: Christina [name], female    DOB: 09/30/1935, 87 y.o.   MRN: [id number]  Chief Complaint  Patient presents with   Dizziness    Moderate dizziness X 1 month and gradually worsening. Describes as lightheadedness and feeling like she is going to fall down. Pt seen in office 12/27/22 and bp was elevated. Pt reported she does not monitor it regularly at home. Upon contacting the office on 12/28/22 patient reported bending over and becoming extremely dizzy. Patient also reports she has been checking her bp at home. Hx of open heart surgery.     HPI Dizziness  She reports new onset dizziness. She describes it as feeling like patient is spinning and feeling unbalanced, occurs every day, and typically lasts 10-15 seconds.  It typically occurs when she is lying back from sitting position, sitting up from lying position, turning head from side to side, bending forward, and standing up from siting position. It is usually relieved by lying down, sitting still, standing still, holding head still, taking deep breaths, closing eyes, rest, and sleep. She has not started new medications around the time the dizziness started.  She states she feels her emotions are provoking her dizziness. Well she is flustered and overwhelmed that is when she feels dizzy. She is battling managing her husband's cardiac needs and her own healthcare needs, which is causing a lot of stress in her life leading to feeling "out of sorts". She feels the stress is causing her to become forgetful. Walking helps. Son will help at home.   Associated symptoms: No hearing loss No tinnitus  No chest discomfort No heart palpitations  No heart racing No numbness or tingling of extremities  No nausea No vomiting  No speech difficulty No visual  changes   Mentions: Scheduled appointment next Wednesday 01/09/23 for new cardiology provider. With Clarisa Kindred, FNP at [facility].  Wt Readings from Last 3 Encounters:  01/02/23 159 lb 9.6 oz (72.4 kg)  12/27/22 162 lb 4.8 oz (73.6 kg)  10/25/22 162 lb 11.2 oz (73.8 kg)    BP Readings from Last 3 Encounters:  01/02/23 (!) 174/84  12/27/22 (!) 169/75  10/25/22 (!) 147/84      Lab Results  Component Value Date   WBC 5.4 12/27/2022   HGB 13.2 12/27/2022   HCT 40.0 12/27/2022   MCV 106 (H) 12/27/2022   PLT 125 (L) 12/27/2022   Lab Results  Component Value Date   NA 139 12/27/2022   K 4.5 12/27/2022   CO2 17 (L) 12/27/2022   BUN 25 12/27/2022   CREATININE 1.27 (H) 12/27/2022   CALCIUM 9.0 12/27/2022   GLUCOSE 90 12/27/2022     ---------------------------------------------------------------------------------------------------   ROS      Objective:    BP (!) 174/84 (BP Location: Left Arm, Patient Position: Sitting, Cuff Size: Normal)   Pulse 76   Resp 14   Wt 159 lb 9.6 oz (72.4 kg)   BMI 25.76 kg/m    Physical Exam Constitutional:      Appearance: Normal appearance. She is normal weight.  HENT:     Head: Normocephalic.     Right Ear: Tympanic membrane, ear canal and external ear normal.     Left Ear: Tympanic membrane, ear canal and external ear normal.  Eyes:  Extraocular Movements: Extraocular movements intact.     Pupils: Pupils are equal, round, and reactive to light.  Cardiovascular:     Rate and Rhythm: Normal rate. Rhythm irregular.     Pulses: Normal pulses.     Comments: Chronic a fib Pulmonary:     Effort: Pulmonary effort is normal.     Breath sounds: Normal breath sounds.  Musculoskeletal:        General: Normal range of motion.     Cervical back: Normal range of motion.  Skin:    General: Skin is warm and dry.     Capillary Refill: Capillary refill takes less than 2 seconds.  Neurological:     Mental Status: She is alert. Mental  status is at baseline.     GCS: GCS eye subscore is 4. GCS verbal subscore is 5. GCS motor subscore is 6.     Cranial Nerves: Cranial nerves 2-12 are intact.     Sensory: Sensation is intact.     Motor: No weakness, tremor, atrophy, abnormal muscle tone, seizure activity or pronator drift.     Coordination: Coordination is intact. Coordination normal.     Comments: Gilberto Better - performed - negative Hearing intact  Psychiatric:        Attention and Perception: Attention normal.        Mood and Affect: Mood is depressed.        Behavior: Behavior normal. Behavior is cooperative.   No results found for any visits on 01/02/23.     Assessment & Plan:   Problem List Items Addressed This Visit       Other   Dizziness - Primary    Pt comes in with concerns today for intermittent dizziness over the last month. Pt contributes the dizziness to being overwhelmed and stress related to husbands health and her own.   Physical assessment today unremarkable. Assessment for vertigo was negative. No nystagmus present, no reproducible dizziness during visit. No hearing loss, tinnitus. Does not appear central in natural, nor does it appear to be BPPV.   Negative for orthostatic BP in office. Pt has cardiology appt. on 01/09/23 for mgmt of her medications and general cardiac health and history. Instructed pt to discuss current dizziness episodes with provider.    Educated on position awareness, pause when going to stand, allow body to adjust when going to walk.       Relevant Orders   Orthostatic vital signs   Stress    Pt have concerns of feeling overwhelmed with life and husbands health. She feels this is the main factor in causing her dizziness, because when she becomes flustered the dizziness happens.   Discussed creating to-do lists, deep breathing exercises, and knowing when to ask for support and help at home.   Pt wants to hold off on stress mgmt or therapy consult.        No orders of  the defined types were placed in this encounter.   Follow up with PCP for chronic disease mgmt in 3 months, cardiology appt on 01/09/23, and as needed for worsening dizziness symptoms.  I, Sallee Provencal, FNP, have reviewed all documentation for this visit. The documentation on 01/02/23 for the exam, diagnosis, procedures, and orders are all accurate and complete.   Sallee Provencal, FNP

## 2023-01-02 NOTE — Assessment & Plan Note (Signed)
Pt comes in with concerns today for intermittent dizziness over the last month. Pt contributes the dizziness to being overwhelmed and stress related to husbands health and her own.   Physical assessment today unremarkable. Assessment for vertigo was negative. No nystagmus present, no reproducible dizziness during visit. No hearing loss, tinnitus. Does not appear central in natural, nor does it appear to be BPPV.   Negative for orthostatic BP in office. Pt has cardiology appt. on 01/09/23 for mgmt of her medications and general cardiac health and history. Instructed pt to discuss current dizziness episodes with provider.    Educated on position awareness, pause when going to stand, allow body to adjust when going to walk.

## 2023-01-02 NOTE — Assessment & Plan Note (Signed)
Pt have concerns of feeling overwhelmed with life and husbands health. She feels this is the main factor in causing her dizziness, because when she becomes flustered the dizziness happens.   Discussed creating to-do lists, deep breathing exercises, and knowing when to ask for support and help at home.   Pt wants to hold off on stress mgmt or therapy consult.

## 2023-01-08 ENCOUNTER — Telehealth: Payer: Self-pay | Admitting: Family

## 2023-01-08 NOTE — Telephone Encounter (Signed)
Pt confirmed appt for 01/09/23

## 2023-01-09 ENCOUNTER — Ambulatory Visit: Payer: Medicare Other | Attending: Family | Admitting: Family

## 2023-01-09 ENCOUNTER — Encounter: Payer: Self-pay | Admitting: Family

## 2023-01-09 VITALS — BP 170/87 | HR 60 | Wt 162.4 lb

## 2023-01-09 DIAGNOSIS — R9431 Abnormal electrocardiogram [ECG] [EKG]: Secondary | ICD-10-CM | POA: Diagnosis not present

## 2023-01-09 DIAGNOSIS — E038 Other specified hypothyroidism: Secondary | ICD-10-CM | POA: Insufficient documentation

## 2023-01-09 DIAGNOSIS — E785 Hyperlipidemia, unspecified: Secondary | ICD-10-CM | POA: Insufficient documentation

## 2023-01-09 DIAGNOSIS — G4733 Obstructive sleep apnea (adult) (pediatric): Secondary | ICD-10-CM | POA: Diagnosis not present

## 2023-01-09 DIAGNOSIS — I480 Paroxysmal atrial fibrillation: Secondary | ICD-10-CM | POA: Insufficient documentation

## 2023-01-09 DIAGNOSIS — E7800 Pure hypercholesterolemia, unspecified: Secondary | ICD-10-CM

## 2023-01-09 DIAGNOSIS — N1831 Chronic kidney disease, stage 3a: Secondary | ICD-10-CM | POA: Diagnosis not present

## 2023-01-09 DIAGNOSIS — I428 Other cardiomyopathies: Secondary | ICD-10-CM | POA: Insufficient documentation

## 2023-01-09 DIAGNOSIS — Z0000 Encounter for general adult medical examination without abnormal findings: Secondary | ICD-10-CM | POA: Diagnosis present

## 2023-01-09 DIAGNOSIS — I739 Peripheral vascular disease, unspecified: Secondary | ICD-10-CM | POA: Diagnosis not present

## 2023-01-09 DIAGNOSIS — I5032 Chronic diastolic (congestive) heart failure: Secondary | ICD-10-CM | POA: Diagnosis not present

## 2023-01-09 DIAGNOSIS — I10 Essential (primary) hypertension: Secondary | ICD-10-CM

## 2023-01-09 DIAGNOSIS — M069 Rheumatoid arthritis, unspecified: Secondary | ICD-10-CM | POA: Insufficient documentation

## 2023-01-09 DIAGNOSIS — J449 Chronic obstructive pulmonary disease, unspecified: Secondary | ICD-10-CM | POA: Diagnosis not present

## 2023-01-09 DIAGNOSIS — I130 Hypertensive heart and chronic kidney disease with heart failure and stage 1 through stage 4 chronic kidney disease, or unspecified chronic kidney disease: Secondary | ICD-10-CM | POA: Diagnosis not present

## 2023-01-09 DIAGNOSIS — I2510 Atherosclerotic heart disease of native coronary artery without angina pectoris: Secondary | ICD-10-CM | POA: Insufficient documentation

## 2023-01-09 DIAGNOSIS — Z951 Presence of aortocoronary bypass graft: Secondary | ICD-10-CM | POA: Diagnosis not present

## 2023-01-09 NOTE — Progress Notes (Signed)
Advanced Heart Failure Clinic Note   Referring Physician: Merita Norton, FNP PCP: Jacky Kindle, FNP (last seen 11/24) Cardiologist: Dorothyann Peng, MD (last seen 04/24)  HPI:  Christina [name] is a 87 y/o female with a history of OSA, RA, multivessel CAD, s/p CABG x3 (2006), paroxysmal atrial fibrillation, moderate tricuspid regurgitation, HFpEF, hypertension, hyperlipidemia, bilateral carotid artery atherosclerosis, peripheral arterial disease, PVD s/p stenting to the lower extremity, osteoarthritis, COPD, CKD stage IIIa, gastritis, Barrett's esophagus, s/p paraesophageal hernia repair (09/05/2021) that was complicated by postop right-sided pleural effusion and esophageal leak.   Admitted 09/05/21 for a robotic assisted laparoscopic paraesophageal hernia repair with Nissen fundoplication (Dr Everlene Farrier, 09/05/2021). Post-operatively, patient was doing well on POD1. UGI was obtained which was reassuring. On POD2, she did develop low grade fever to 100.80F. Underwent extensive work up including CT Chest, which was concerning for right pleural effusion but also potentially showed esophageal leak. Interventional radiology was consulted and placed chest tube (08/03). She underwent repeat CT Chest (Esophagram) which did not show esophageal leak. She was kept NPO as a precaution. Underwent repeat UGI on 08/07 which was negative and diet was restarted. Chest tube output was trended. Chylothorax was ruled out. She was found to have exudative pleural effusion vis Light's Criteria. Pulmonology was consulted on 08/11. On 08/14, her CXR remained reassuring and output was under <200 ccs. Chest tube was removed. The remainder of patient's hospital course was essentially unremarkable and she was discharged after 13 days.  Echo 12/12/17: EF >55% with Grade I DD Echo 06/20/20: EF >55% with mild LAE, moderate MR, severe TR Echo 04/10/21: EF 50-55% with mild LVH, moderate LAE/RAE, mild/ moderate MR, severe TR  Echo 09/10/21: EF 55-60%  with mild LVH, Grade I DD, moderate LAE/RAE, trivial MR, moderate TR  She presents today for her initial HF visit at the request of her PCP with a chief complaint of moderate shortness of breath with minimal exertion. Chronic in nature and she feels like it's worsening over the last few weeks. Has associated fatigue, rare palpitations, dizziness (improving), pedal edema, anxiety & chronic difficulty sleeping along with this. Denies chest pain, cough, abdominal distention or weight gain. Had some worsening dizziness which she thinks may have been stress induced as she's trying to keep up with her husband's medical condition in addition to her own.   Wearing CPAP nightly. Not weighing daily and does like to add salt. Is taking hydrochlorothiazide daily but not her torsemide.   Review of Systems: [y] = yes, [ ]  = no   General: Weight gain [ ] ; Weight loss [ ] ; Anorexia [ ] ; Fatigue Cove.Etienne ]; Fever [ ] ; Chills [ ] ; Weakness [ ]   Cardiac: Chest pain/pressure [ ] ; Resting SOB [ ] ; Exertional SOB Cove.Etienne ]; Orthopnea [ ] ; Pedal Edema Cove.Etienne ]; Palpitations Cove.Etienne ]; Syncope [ ] ; Presyncope [ ] ; Paroxysmal nocturnal dyspnea[ ]   Pulmonary: Cough [ ] ; Wheezing[ ] ; Hemoptysis[ ] ; Sputum [ ] ; Snoring [ ]   GI: Vomiting[ ] ; Dysphagia[ ] ; Melena[ ] ; Hematochezia [ ] ; Heartburn[ ] ; Abdominal pain [ ] ; Constipation [ ] ; Diarrhea [ ] ; BRBPR [ ]   GU: Hematuria[ ] ; Dysuria [ ] ; Nocturia[ ]   Vascular: Pain in legs with walking [ ] ; Pain in feet with lying flat [ ] ; Non-healing sores [ ] ; Stroke [ ] ; TIA [ ] ; Slurred speech [ ] ;  Neuro: Headaches[ ] ; Vertigo[ ] ; Seizures[ ] ; Paresthesias[ ] ;Blurred vision [ ] ; Diplopia [ ] ; Vision changes [ ]  ; Dizziness [y] Ortho/Skin:  Arthritis [ ] ; Joint pain [ ] ; Muscle pain [ ] ; Joint swelling [ ] ; Back Pain [ ] ; Rash [ ]   Psych: Depression[ ] ; Anxiety[y ]  Heme: Bleeding problems [ ] ; Clotting disorders [ ] ; Anemia [ ]   Endocrine: Diabetes [ ] ; Thyroid dysfunction[ ]    Past Medical History:   Diagnosis Date   Adenomatous colon polyp    Anemia    Aortic atherosclerosis (HCC)    Arthritis    Barrett's esophagus    Bilateral carotid artery disease (HCC)    a.) carotid doppler 07/18/2020: 1-39% RICA, 60-79% LICA; b.) carotid doppler 08/15/2020: 70-99% LICA, no sig RICA; c.) carotid doppler 01/23/2021: 1-39% RICA, 40-59% LICA; d.) carotid doppler 08/03/2021: 40-59% LICA, near norm RICA   CKD (chronic kidney disease), stage III (HCC)    COPD (chronic obstructive pulmonary disease) (HCC)    Coronary artery disease 04/21/2004   a.) LHC 04/21/2004 [facility]): EF 66%, 50% dLCx, 75% pLCx, 75% oRCA, 50% pRCA --> transferred to [facility] for CVTS consult; b.) 3v CABG   Dyspnea on exertion    GERD (gastroesophageal reflux disease)    Hiatal hernia    Hyperlipidemia    Hypertension    Long term current use of anticoagulant    a.) reduced dose apixaban   Macular degeneration    OSA on CPAP    PAD (peripheral artery disease) (HCC) 12/10/2016   a.) PTA of BILATERAL common iliac arteries --> 7 x 26 mm stents placed   PAF (paroxysmal atrial fibrillation) (HCC)    a.) CHA2DS2-VASc = 6 (age x 2, sex, HTN, vascular disease, T2DM);  b.) rate/rhythm maintained on oral metoprolol tartrate; chronically anticoagulated using dose reduced apixaban   Peripheral edema    PSVT (paroxysmal supraventricular tachycardia) (HCC)    S/P CABG x 3 04/2004   Schatzki's ring    Scoliosis of thoracolumbar spine    Valvular heart disease    a.) TTE 04/20/2004: EF >55%, mild MR; b.)  TTE 04/10/2021: EF 50-55%, LVH, reduced RV SF, RVE, moderate BAE, mild-mod MR, severe TR.    Current Outpatient Medications  Medication Sig Dispense Refill   acetaminophen (TYLENOL) 500 MG tablet Take 500 mg by mouth every 8 (eight) hours as needed for mild pain or moderate pain.     apixaban (ELIQUIS) 2.5 MG TABS tablet Take by mouth 2 (two) times daily.     Ascorbic Acid (VITAMIN C) 1000 MG tablet Take 1,000 mg by mouth 2 (two)  times daily.     azelastine (ASTELIN) 0.1 % nasal spray Place 1 spray into both nostrils daily as needed.     carboxymethylcellulose (REFRESH PLUS) 0.5 % SOLN 1 drop 3 (three) times daily as needed.     hydrochlorothiazide (HYDRODIURIL) 25 MG tablet TAKE 1 TABLET (25 MG TOTAL) BY MOUTH DAILY. 90 tablet 1   ipratropium-albuterol (DUONEB) 0.5-2.5 (3) MG/3ML SOLN Take 3 mLs by nebulization 2 (two) times daily.     Metoprolol Tartrate 75 MG TABS TAKE 1 TABLET BY MOUTH TWICE A DAY 180 tablet 0   prednisoLONE acetate (PRED FORTE) 1 % ophthalmic suspension Place 1 drop into both eyes every 2 (two) hours while awake.     rosuvastatin (CRESTOR) 20 MG tablet TAKE 1 TABLET BY MOUTH EVERY DAY IN THE EVENING 90 tablet 1   tobramycin (TOBREX) 0.3 % ophthalmic solution   5   torsemide (DEMADEX) 20 MG tablet Take 20 mg by mouth daily.     No current facility-administered medications for  this visit.    Allergies  Allergen Reactions   Baclofen Other (See Comments)   Codeine Nausea Only   Plavix [Clopidogrel]     brusing all over   Sulfa Antibiotics Nausea Only   Latex Rash      Social History   Socioeconomic History   Marital status: Married    Spouse name: Cliffton BENJIE WORTHAN   Number of children: 2   Years of education: 12   Highest education level: 12th grade  Occupational History   Occupation: retired  Tobacco Use   Smoking status: Former    Current packs/day: 0.00    Average packs/day: 0.5 packs/day for 5.0 years (2.5 ttl pk-yrs)    Types: Cigarettes    Start date: 04/25/1999    Quit date: 04/24/2004    Years since quitting: 18.7    Passive exposure: Past   Smokeless tobacco: Never  Vaping Use   Vaping status: Never Used  Substance and Sexual Activity   Alcohol use: No   Drug use: No   Sexual activity: Yes  Other Topics Concern   Not on file  Social History Narrative   Not on file   Social Determinants of Health   Financial Resource Strain: Low Risk  (12/27/2022)   Overall  Financial Resource Strain (CARDIA)    Difficulty of Paying Living Expenses: Not hard at all  Food Insecurity: No Food Insecurity (12/27/2022)   Hunger Vital Sign    Worried About Running Out of Food in the Last Year: Never true    Ran Out of Food in the Last Year: Never true  Transportation Needs: No Transportation Needs (12/27/2022)   PRAPARE - Administrator, Civil Service (Medical): No    Lack of Transportation (Non-Medical): No  Physical Activity: Inactive (12/27/2022)   Exercise Vital Sign    Days of Exercise per Week: 0 days    Minutes of Exercise per Session: 0 min  Stress: Stress Concern Present (12/27/2022)   [employer] of Occupational Health - Occupational Stress Questionnaire    Feeling of Stress : Very much  Social Connections: Moderately Integrated (12/27/2022)   Social Connection and Isolation Panel [NHANES]    Frequency of Communication with Friends and Family: Three times a week    Frequency of Social Gatherings with Friends and Family: More than three times a week    Attends Religious Services: More than 4 times per year    Active Member of [employer] or Organizations: No    Attends Banker Meetings: Never    Marital Status: Married  Catering manager Violence: Not At Risk (12/27/2022)   Humiliation, Afraid, Rape, and Kick questionnaire    Fear of Current or Ex-Partner: No    Emotionally Abused: No    Physically Abused: No    Sexually Abused: No      Family History  Problem Relation Age of Onset   Hypertension Mother    Hyperlipidemia Mother    Alzheimer's disease Mother    CAD Mother    Heart attack Father    Lung disease Sister    Heart disease Sister    Diabetes Paternal Grandfather        Type 2   Hearing loss Son    Vitals:   01/09/23 1119  BP: (!) 170/87  Pulse: 60  SpO2: 99%  Weight: 162 lb 6 oz (73.7 kg)   Wt Readings from Last 3 Encounters:  01/09/23 162 lb 6 oz (73.7 kg)  01/02/23 159  lb 9.6 oz (72.4 kg)   12/27/22 162 lb 4.8 oz (73.6 kg)   Lab Results  Component Value Date   CREATININE 1.27 (H) 12/27/2022   CREATININE 1.24 (H) 01/15/2022   CREATININE 1.03 (H) 09/25/2021   PHYSICAL EXAM: General:  Well appearing. No respiratory difficulty HEENT: normal Neck: supple. no JVD. Carotids 2+ bilat; no bruits. No lymphadenopathy or thyromegaly appreciated. Cor: PMI nondisplaced. Regular rate & irregular rhythm. No rubs, gallops or murmurs. Lungs: clear Abdomen: soft, nontender, nondistended. No hepatosplenomegaly. No bruits or masses. Good bowel sounds. Extremities: no cyanosis, clubbing, rash, 1+ pitting edema bilateral lower legs Neuro: alert & oriented x 3, cranial nerves grossly intact. moves all 4 extremities w/o difficulty. Affect pleasant.  ECG: atrial fibrillation, HR 66 (personally reviewed)   ASSESSMENT & PLAN:  1: NICM with preserved ejection fraction- - suspect due to HTN/ OSA; CABG in 2006 - NYHA class III - euvolemic - instructed to start weighing daily; call for overnight weight gain of > 2 pounds or a weekly weight gain of > 5 pounds - Echo 12/12/17: EF >55% with Grade I DD - Echo 06/20/20: EF >55% with mild LAE, moderate MR, severe TR - Echo 04/10/21: EF 50-55% with mild LVH, moderate LAE/RAE, mild/ moderate MR, severe TR  - Echo 09/10/21: EF 55-60% with mild LVH, Grade I DD, moderate LAE/RAE, trivial MR, moderate TR - updated echo scheduled for 02/20/23 - stop hydrochlorothiazide - begin torsemide 20mg  daily - discuss MRA/ SGLT2 at next visit - wear compression socks daily with removal at bedtime - reviewed the importance of not adding salt and reading food labels for sodium content - BMET next visit - BNP 12/06/21 was 337.3  2: HTN- - BP 170/87; admits that she was anxious about coming today - check BP at home and bring BP log to next visit - saw PCP Ephriam Knuckles) 11/24 - BMP 12/27/22 showed sodium 139, potassium 4.5, creatinine 1.27 & GFR 41  3: CAD- - saw cardiology  Juliann Pares) 04/24 - CABG X 3 in 2006  4: Hyperlipidemia- - LDL 12/27/22 was 56  5: COPD/ OSA- - saw pulmonology Karna Christmas) 08/24 - wearing CPAP nightly  6: PAF- - EKG today shows AF  - continue apixaban 2.5mg  BID - continue metoprolol tartrate 75mg  daily   Return in 1 month, sooner if needed.    Delma Freeze, FNP 01/09/23

## 2023-01-09 NOTE — Patient Instructions (Addendum)
DISCONTINUE HYDROCHLOROTHIAZIDE  RESTART TAKING TORSEMIDE 20 MG ONCE DAILY  Please have your echo completed. You will check in for this at the [facility]. You have to arrive 15 MINS EARLY for preparation, otherwise you will have to reschedule.   Begin weighing daily and call for an overnight weight gain of 3 pounds or more or a weekly weight gain of more than 5 pounds.

## 2023-02-05 ENCOUNTER — Other Ambulatory Visit: Payer: Self-pay | Admitting: Family Medicine

## 2023-02-05 DIAGNOSIS — H43813 Vitreous degeneration, bilateral: Secondary | ICD-10-CM | POA: Diagnosis not present

## 2023-02-05 DIAGNOSIS — H353221 Exudative age-related macular degeneration, left eye, with active choroidal neovascularization: Secondary | ICD-10-CM | POA: Diagnosis not present

## 2023-02-05 DIAGNOSIS — H04123 Dry eye syndrome of bilateral lacrimal glands: Secondary | ICD-10-CM | POA: Diagnosis not present

## 2023-02-05 DIAGNOSIS — H35432 Paving stone degeneration of retina, left eye: Secondary | ICD-10-CM | POA: Diagnosis not present

## 2023-02-05 DIAGNOSIS — H353213 Exudative age-related macular degeneration, right eye, with inactive scar: Secondary | ICD-10-CM | POA: Diagnosis not present

## 2023-02-05 DIAGNOSIS — H26491 Other secondary cataract, right eye: Secondary | ICD-10-CM | POA: Diagnosis not present

## 2023-02-05 DIAGNOSIS — E7800 Pure hypercholesterolemia, unspecified: Secondary | ICD-10-CM

## 2023-02-20 ENCOUNTER — Ambulatory Visit
Admission: RE | Admit: 2023-02-20 | Discharge: 2023-02-20 | Disposition: A | Discharge status: Home / Self Care | Payer: Medicare Other | Source: Ambulatory Visit | Attending: Family

## 2023-02-20 DIAGNOSIS — I480 Paroxysmal atrial fibrillation: Secondary | ICD-10-CM | POA: Insufficient documentation

## 2023-02-20 DIAGNOSIS — I5032 Chronic diastolic (congestive) heart failure: Secondary | ICD-10-CM | POA: Insufficient documentation

## 2023-02-20 DIAGNOSIS — I110 Hypertensive heart disease with heart failure: Secondary | ICD-10-CM | POA: Diagnosis not present

## 2023-02-20 DIAGNOSIS — J449 Chronic obstructive pulmonary disease, unspecified: Secondary | ICD-10-CM | POA: Diagnosis not present

## 2023-02-20 DIAGNOSIS — I081 Rheumatic disorders of both mitral and tricuspid valves: Secondary | ICD-10-CM | POA: Insufficient documentation

## 2023-02-20 DIAGNOSIS — Z951 Presence of aortocoronary bypass graft: Secondary | ICD-10-CM | POA: Insufficient documentation

## 2023-02-20 LAB — ECHOCARDIOGRAM COMPLETE
Area-P 1/2: 4.89 cm2
S' Lateral: 3.2 cm

## 2023-02-20 NOTE — Progress Notes (Signed)
*  PRELIMINARY RESULTS* Echocardiogram 2D Echocardiogram has been performed.  Broadus Canes 02/20/2023, 11:39 AM

## 2023-02-27 ENCOUNTER — Other Ambulatory Visit
Admission: RE | Admit: 2023-02-27 | Discharge: 2023-02-27 | Disposition: A | Discharge status: Home / Self Care | Payer: Medicare Other | Source: Ambulatory Visit | Attending: Family | Admitting: Family

## 2023-02-27 ENCOUNTER — Encounter: Payer: Self-pay | Admitting: Family

## 2023-02-27 ENCOUNTER — Other Ambulatory Visit (HOSPITAL_COMMUNITY): Payer: Self-pay

## 2023-02-27 ENCOUNTER — Telehealth: Payer: Self-pay

## 2023-02-27 ENCOUNTER — Ambulatory Visit: Payer: Medicare Other | Admitting: Family

## 2023-02-27 VITALS — BP 159/72 | HR 68 | Wt 155.0 lb

## 2023-02-27 DIAGNOSIS — I739 Peripheral vascular disease, unspecified: Secondary | ICD-10-CM | POA: Insufficient documentation

## 2023-02-27 DIAGNOSIS — E785 Hyperlipidemia, unspecified: Secondary | ICD-10-CM | POA: Diagnosis not present

## 2023-02-27 DIAGNOSIS — Z951 Presence of aortocoronary bypass graft: Secondary | ICD-10-CM | POA: Diagnosis not present

## 2023-02-27 DIAGNOSIS — R0602 Shortness of breath: Secondary | ICD-10-CM | POA: Diagnosis not present

## 2023-02-27 DIAGNOSIS — I130 Hypertensive heart and chronic kidney disease with heart failure and stage 1 through stage 4 chronic kidney disease, or unspecified chronic kidney disease: Secondary | ICD-10-CM | POA: Insufficient documentation

## 2023-02-27 DIAGNOSIS — Z87891 Personal history of nicotine dependence: Secondary | ICD-10-CM | POA: Insufficient documentation

## 2023-02-27 DIAGNOSIS — Z79899 Other long term (current) drug therapy: Secondary | ICD-10-CM | POA: Insufficient documentation

## 2023-02-27 DIAGNOSIS — K449 Diaphragmatic hernia without obstruction or gangrene: Secondary | ICD-10-CM | POA: Insufficient documentation

## 2023-02-27 DIAGNOSIS — I480 Paroxysmal atrial fibrillation: Secondary | ICD-10-CM | POA: Insufficient documentation

## 2023-02-27 DIAGNOSIS — I2510 Atherosclerotic heart disease of native coronary artery without angina pectoris: Secondary | ICD-10-CM | POA: Diagnosis not present

## 2023-02-27 DIAGNOSIS — G4733 Obstructive sleep apnea (adult) (pediatric): Secondary | ICD-10-CM | POA: Insufficient documentation

## 2023-02-27 DIAGNOSIS — R140 Abdominal distension (gaseous): Secondary | ICD-10-CM | POA: Insufficient documentation

## 2023-02-27 DIAGNOSIS — I5032 Chronic diastolic (congestive) heart failure: Secondary | ICD-10-CM

## 2023-02-27 DIAGNOSIS — Z7901 Long term (current) use of anticoagulants: Secondary | ICD-10-CM | POA: Diagnosis not present

## 2023-02-27 DIAGNOSIS — I428 Other cardiomyopathies: Secondary | ICD-10-CM | POA: Insufficient documentation

## 2023-02-27 DIAGNOSIS — J449 Chronic obstructive pulmonary disease, unspecified: Secondary | ICD-10-CM

## 2023-02-27 DIAGNOSIS — I6523 Occlusion and stenosis of bilateral carotid arteries: Secondary | ICD-10-CM | POA: Insufficient documentation

## 2023-02-27 DIAGNOSIS — I10 Essential (primary) hypertension: Secondary | ICD-10-CM

## 2023-02-27 DIAGNOSIS — E7800 Pure hypercholesterolemia, unspecified: Secondary | ICD-10-CM

## 2023-02-27 DIAGNOSIS — I272 Pulmonary hypertension, unspecified: Secondary | ICD-10-CM | POA: Diagnosis not present

## 2023-02-27 DIAGNOSIS — R002 Palpitations: Secondary | ICD-10-CM | POA: Diagnosis not present

## 2023-02-27 DIAGNOSIS — N1831 Chronic kidney disease, stage 3a: Secondary | ICD-10-CM | POA: Diagnosis not present

## 2023-02-27 DIAGNOSIS — M1990 Unspecified osteoarthritis, unspecified site: Secondary | ICD-10-CM | POA: Insufficient documentation

## 2023-02-27 DIAGNOSIS — I071 Rheumatic tricuspid insufficiency: Secondary | ICD-10-CM | POA: Diagnosis not present

## 2023-02-27 LAB — BASIC METABOLIC PANEL
Anion gap: 9 (ref 5–15)
BUN: 23 mg/dL (ref 8–23)
CO2: 25 mmol/L (ref 22–32)
Calcium: 9.1 mg/dL (ref 8.9–10.3)
Chloride: 104 mmol/L (ref 98–111)
Creatinine, Ser: 1 mg/dL (ref 0.44–1.00)
GFR, Estimated: 55 mL/min — ABNORMAL LOW (ref >60)
Glucose, Bld: 87 mg/dL (ref 70–99)
Potassium: 4.4 mmol/L (ref 3.5–5.1)
Sodium: 138 mmol/L (ref 135–145)

## 2023-02-27 MED ORDER — SPIRONOLACTONE 25 MG PO TABS: 25.0000 mg | ORAL_TABLET | Freq: Every day | ORAL | 3 refills | Status: DC

## 2023-02-27 NOTE — Telephone Encounter (Signed)
Advanced Heart Failure Patient Advocate Encounter  Test claims processed for [place]. Both medications are covered by this plan, showing $47 for 30 days, and $141 for 90 day supply.  Of note, Dapagliflozin is not covered by this insurance at this time.  Burnell Blanks, CPhT Rx Patient Advocate Phone: [phone]

## 2023-02-27 NOTE — Progress Notes (Signed)
Advanced Heart Failure Clinic Note   Referring provider: Merita [name], Christina Schwalbe, FNP PCP: Christina Kindle, FNP (last seen 11/24) Cardiologist: Christina Peng, MD (last seen 04/24)  Chief Complaint: shortness of breath  HPI:  Christina [name] is a 88 y/o female with a history of OSA, RA, multivessel CAD, s/p CABG x3 (2006), paroxysmal atrial fibrillation, moderate tricuspid regurgitation, HFpEF, hypertension, hyperlipidemia, bilateral carotid artery atherosclerosis, peripheral arterial disease, PVD s/p stenting to the lower extremity, osteoarthritis, COPD, CKD stage IIIa, gastritis, Barrett's esophagus, s/p paraesophageal hernia repair (09/05/2021) that was complicated by postop right-sided pleural effusion and esophageal leak.   Admitted 09/05/21 for a robotic assisted laparoscopic paraesophageal hernia repair with Nissen fundoplication (Dr Everlene Farrier, 09/05/2021). Post-operatively, patient was doing well on POD1. UGI was obtained which was reassuring. On POD2, she did develop low grade fever to 100.66F. Underwent extensive work up including CT Chest, which was concerning for right pleural effusion but also potentially showed esophageal leak. Interventional radiology was consulted and placed chest tube (08/03). She underwent repeat CT Chest (Esophagram) which did not show esophageal leak. She was kept NPO as a precaution. Underwent repeat UGI on 08/07 which was negative and diet was restarted. Chest tube output was trended. Chylothorax was ruled out. She was found to have exudative pleural effusion vis Light's Criteria. Pulmonology was consulted on 08/11. On 08/14, her CXR remained reassuring and output was under <200 ccs. Chest tube was removed. The remainder of patient's hospital course was essentially unremarkable and she was discharged after 13 days.  Echo 12/12/17: EF >55% with Grade I DD Echo 06/20/20: EF >55% with mild LAE, moderate MR, severe TR Echo 04/10/21: EF 50-55% with mild LVH, moderate LAE/RAE, mild/  moderate MR, severe TR  Echo 09/10/21: EF 55-60% with mild LVH, Grade I DD, moderate LAE/RAE, trivial MR, moderate TR Echo 02/20/23: EF 60-65% with moderately elevated PA pressure of 52.9 mmHg, moderate LAE, mild/ moderate MR, severe TR  She presents today, with her husband of 65 years, for a HF follow-up visit with a chief complaint of moderate shortness of breath. Was able to walk from the front door of the building into the office with some SOB. Has associated fatigue, occasional palpitations, abdominal distention, occasional dizziness and chronic difficulty sleeping along with this. Denies chest pain, cough or pedal edema.    Wearing CPAP nightly. Not weighing daily and does like to add salt.    At last visit, hydrochlorothiazide was stopped and torsemide 20mg  daily was started. She says that she doesn't notice any issues with the torsemide but admits that she doesn't take it if they have a full day of appointments. She also says that she "occasionally" misses her evening dose of medication although her husband says that it happens "all the time". Does use a pillbox which is how she knows she forget her meds.   Reviewed echo results with patient and husband.   ROS: All systems negative except as listed in HPI, PMH and Problem List.  SH:  Social History   Socioeconomic History   Marital status: Married    Spouse name: Christina [name]   Number of children: 2   Years of education: 12   Highest education level: 12th grade  Occupational History   Occupation: retired  Tobacco Use   Smoking status: Former    Current packs/day: 0.00    Average packs/day: 0.5 packs/day for 5.0 years (2.5 ttl pk-yrs)    Types: Cigarettes    Start date: 04/25/1999  Quit date: 04/24/2004    Years since quitting: 18.8    Passive exposure: Past   Smokeless tobacco: Never  Vaping Use   Vaping status: Never Used  Substance and Sexual Activity   Alcohol use: No   Drug use: No   Sexual activity: Yes   Other Topics Concern   Not on file  Social History Narrative   Not on file   Social Drivers of Health   Financial Resource Strain: Low Risk  (12/27/2022)   Overall Financial Resource Strain (CARDIA)    Difficulty of Paying Living Expenses: Not hard at all  Food Insecurity: No Food Insecurity (12/27/2022)   Hunger Vital Sign    Worried About Running Out of Food in the Last Year: Never true    Ran Out of Food in the Last Year: Never true  Transportation Needs: No Transportation Needs (12/27/2022)   PRAPARE - Administrator, Civil Service (Medical): No    Lack of Transportation (Non-Medical): No  Physical Activity: Inactive (12/27/2022)   Exercise Vital Sign    Days of Exercise per Week: 0 days    Minutes of Exercise per Session: 0 min  Stress: Stress Concern Present (12/27/2022)   [employer] of Occupational Health - Occupational Stress Questionnaire    Feeling of Stress : Very much  Social Connections: Moderately Integrated (12/27/2022)   Social Connection and Isolation Panel [NHANES]    Frequency of Communication with Friends and Family: Three times a week    Frequency of Social Gatherings with Friends and Family: More than three times a week    Attends Religious Services: More than 4 times per year    Active Member of [employer] or Organizations: No    Attends Banker Meetings: Never    Marital Status: Married  Catering manager Violence: Not At Risk (12/27/2022)   Humiliation, Afraid, Rape, and Kick questionnaire    Fear of Current or Ex-Partner: No    Emotionally Abused: No    Physically Abused: No    Sexually Abused: No    FH:  Family History  Problem Relation Age of Onset   Hypertension Mother    Hyperlipidemia Mother    Alzheimer's disease Mother    CAD Mother    Heart attack Father    Lung disease Sister    Heart disease Sister    Diabetes Paternal Grandfather        Type 2   Hearing loss Son     Past Medical History:   Diagnosis Date   Adenomatous colon polyp    Anemia    Aortic atherosclerosis (HCC)    Arthritis    Barrett's esophagus    Bilateral carotid artery disease (HCC)    a.) carotid doppler 07/18/2020: 1-39% RICA, 60-79% LICA; b.) carotid doppler 08/15/2020: 70-99% LICA, no sig RICA; c.) carotid doppler 01/23/2021: 1-39% RICA, 40-59% LICA; d.) carotid doppler 08/03/2021: 40-59% LICA, near norm RICA   CKD (chronic kidney disease), stage III (HCC)    COPD (chronic obstructive pulmonary disease) (HCC)    Coronary artery disease 04/21/2004   a.) LHC 04/21/2004 [facility]): EF 66%, 50% dLCx, 75% pLCx, 75% oRCA, 50% pRCA --> transferred to [facility] for CVTS consult; b.) 3v CABG   Dyspnea on exertion    GERD (gastroesophageal reflux disease)    Hiatal hernia    Hyperlipidemia    Hypertension    Long term current use of anticoagulant    a.) reduced dose apixaban   Macular degeneration  OSA on CPAP    PAD (peripheral artery disease) (HCC) 12/10/2016   a.) PTA of BILATERAL common iliac arteries --> 7 x 26 mm stents placed   PAF (paroxysmal atrial fibrillation) (HCC)    a.) CHA2DS2-VASc = 6 (age x 2, sex, HTN, vascular disease, T2DM);  b.) rate/rhythm maintained on oral metoprolol tartrate; chronically anticoagulated using dose reduced apixaban   Peripheral edema    PSVT (paroxysmal supraventricular tachycardia) (HCC)    S/P CABG x 3 04/2004   Schatzki's ring    Scoliosis of thoracolumbar spine    Valvular heart disease    a.) TTE 04/20/2004: EF >55%, mild MR; b.)  TTE 04/10/2021: EF 50-55%, LVH, reduced RV SF, RVE, moderate BAE, mild-mod MR, severe TR.    Current Outpatient Medications  Medication Sig Dispense Refill   acetaminophen (TYLENOL) 500 MG tablet Take 500 mg by mouth every 8 (eight) hours as needed for mild pain or moderate pain.     apixaban (ELIQUIS) 2.5 MG TABS tablet Take by mouth 2 (two) times daily.     Ascorbic Acid (VITAMIN C) 1000 MG tablet Take 1,000 mg by mouth 2 (two)  times daily.     azelastine (ASTELIN) 0.1 % nasal spray Place 1 spray into both nostrils daily as needed.     carboxymethylcellulose (REFRESH PLUS) 0.5 % SOLN 1 drop 3 (three) times daily as needed.     hydrochlorothiazide (HYDRODIURIL) 25 MG tablet TAKE 1 TABLET (25 MG TOTAL) BY MOUTH DAILY. (Patient not taking: Reported on 01/09/2023) 90 tablet 1   ipratropium-albuterol (DUONEB) 0.5-2.5 (3) MG/3ML SOLN Take 3 mLs by nebulization 2 (two) times daily.     Metoprolol Tartrate 75 MG TABS TAKE 1 TABLET BY MOUTH TWICE A DAY 180 tablet 0   prednisoLONE acetate (PRED FORTE) 1 % ophthalmic suspension Place 1 drop into both eyes every 2 (two) hours while awake.     rosuvastatin (CRESTOR) 20 MG tablet TAKE 1 TABLET BY MOUTH EVERY DAY IN THE EVENING 90 tablet 1   tobramycin (TOBREX) 0.3 % ophthalmic solution   5   torsemide (DEMADEX) 20 MG tablet Take 20 mg by mouth daily. (Patient not taking: Reported on 01/09/2023)     No current facility-administered medications for this visit.   Vitals:   02/27/23 1040  BP: (!) 159/72  Pulse: 68  SpO2: 98%  Weight: 155 lb (70.3 kg)   Wt Readings from Last 3 Encounters:  02/27/23 155 lb (70.3 kg)  01/09/23 162 lb 6 oz (73.7 kg)  01/02/23 159 lb 9.6 oz (72.4 kg)   Lab Results  Component Value Date   CREATININE 1.27 (H) 12/27/2022   CREATININE 1.24 (H) 01/15/2022   CREATININE 1.03 (H) 09/25/2021    PHYSICAL EXAM:  General:  Well appearing. No resp difficulty HEENT: normal Neck: supple. JVP flat. Carotids 2+ bilaterally; no bruits. No lymphadenopathy or thryomegaly appreciated. Cor: PMI normal. Regular rate & irregular rhythm. No rubs, gallops or murmurs. Lungs: clear Abdomen: soft, nontender, nondistended. No hepatosplenomegaly. No bruits or masses. Good bowel sounds. Extremities: no cyanosis, clubbing, rash, edema Neuro: alert & oriented x3, cranial nerves grossly intact. Moves all 4 extremities w/o difficulty. Affect pleasant.   ECG: not  done   ASSESSMENT & PLAN:  1: NICM with preserved ejection fraction- - suspect due to HTN/ OSA; CABG in 2006 - NYHA class III - euvolemic - brought her weight log although is not weighing daily; weight ranges from 150-155 lbs; reminded to call for overnight weight  gain of > 2 pounds or a weekly weight gain of > 5 pounds - weight down 7 pounds from last visit here 6 weeks ago - Echo 12/12/17: EF >55% with Grade I DD - Echo 06/20/20: EF >55% with mild LAE, moderate MR, severe TR - Echo 04/10/21: EF 50-55% with mild LVH, moderate LAE/RAE, mild/ moderate MR, severe TR  - Echo 09/10/21: EF 55-60% with mild LVH, Grade I DD, moderate LAE/RAE, trivial MR, moderate TR - Echo 02/20/23: EF 60-65% with moderately elevated PA pressure of 52.9 mmHg, moderate LAE, mild/ moderate MR, severe TR - continue torsemide 20mg  daily - discussed MRA/ SGLT2 but she is concerned about the cost of SGLT2 - begin spironolactone 25mg  daily - will have pharmacy look into cost of SGLT2 and whether she would qualify for a grant - BMET today and again at next visit - wear compression socks daily with removal at bedtime - reviewed the importance of not adding salt and reading food labels for sodium content - BNP 12/06/21 was 337.3  2: HTN- - BP 159/72 (improved from last visit) - starting spironolactone 25mg  daily - saw PCP Christina [name]) 11/24 - BMP 12/27/22 showed sodium 139, potassium 4.5, creatinine 1.27 & GFR 41 - BMET today  3: CAD- - saw cardiology Christina [name]) 04/24; she says that she doesn't intend to return to him - CABG X 3 in 2006  4: Hyperlipidemia- - LDL 12/27/22 was 56 - continue rosuvastatin 20mg  daily  5: COPD/ OSA- - saw pulmonology Christina [name]) 08/24 - wearing CPAP nightly  6: PAF- - continue apixaban 2.5mg  BID although it sounds like she regularly forgets the PM dose; may need to switch this to xarelto for compliance - continue metoprolol tartrate 75mg  BID; may need to change this to succinate for  compliance - will defer these changes since adding spiro today and she admits that she gets overwhelmed/ anxious with too many changes at one time  7: Pulmonary HTN- - Echo 02/20/23: EF 60-65% with moderately elevated PA pressure of 52.9 mmHg, moderate LAE, mild/ moderate MR, severe TR - discussed results w/ her and about her returning to Dr. Juliann [name] - she says that she has no intention of returning to his office and wants to continue f/u here - will have her see HF MD at next visit to discuss whether any treatment of pHTN needs to occur  Return in 2 weeks, sooner if needed.

## 2023-02-27 NOTE — Patient Instructions (Addendum)
START SPIRONOLACTONE 25 MD ONCE DAILY  Go DOWN to LOWER LEVEL (LL) to have your blood work completed inside of [employer] office.  We will only call you if the results are abnormal or if the provider would like to make medication changes.

## 2023-03-04 DIAGNOSIS — Z1231 Encounter for screening mammogram for malignant neoplasm of breast: Secondary | ICD-10-CM | POA: Diagnosis not present

## 2023-03-04 LAB — HM MAMMOGRAPHY

## 2023-03-05 ENCOUNTER — Encounter: Payer: Self-pay | Admitting: Family Medicine

## 2023-03-06 NOTE — Progress Notes (Signed)
No Mychart access  Mammogram - negative, L breast present known clip. Continue with annual screenings.

## 2023-03-07 DIAGNOSIS — J449 Chronic obstructive pulmonary disease, unspecified: Secondary | ICD-10-CM | POA: Diagnosis not present

## 2023-03-11 ENCOUNTER — Telehealth: Payer: Self-pay | Admitting: Cardiology

## 2023-03-11 NOTE — Telephone Encounter (Signed)
 Pt confirmed appt

## 2023-03-15 ENCOUNTER — Encounter: Payer: Medicare Other | Admitting: Cardiology

## 2023-03-18 ENCOUNTER — Other Ambulatory Visit: Payer: Self-pay | Admitting: Family Medicine

## 2023-03-18 DIAGNOSIS — I10 Essential (primary) hypertension: Secondary | ICD-10-CM

## 2023-03-18 NOTE — Telephone Encounter (Signed)
 Medication Refill -  Most Recent Primary Care Visit:  Provider: CLIFTON, KELLIE A  Department: [id number]  Visit Type: OFFICE VISIT  Date: 01/02/2023  Medication: Metoprolol  75 mg  Has the patient contacted their pharmacy? Yes (Agent: If no, request that the patient contact the pharmacy for the refill. If patient does not wish to contact the pharmacy document the reason why and proceed with request.) (Agent: If yes, when and what did the pharmacy advise?)  Is this the correct pharmacy for this prescription? Yes If no, delete pharmacy and type the correct one.  This is the patient's preferred pharmacy:   [facility]  Has the prescription been filled recently? Yes  Is the patient out of the medication? Yes  Has the patient been seen for an appointment in the last year OR does the patient have an upcoming appointment? Yes  Can we respond through MyChart? No  Agent: Please be advised that Rx refills may take up to 3 business days. We ask that you follow-up with your pharmacy.

## 2023-03-19 MED ORDER — METOPROLOL TARTRATE 75 MG PO TABS: 1.0000 | ORAL_TABLET | Freq: Two times a day (BID) | ORAL | 0 refills | Status: DC

## 2023-03-19 NOTE — Telephone Encounter (Signed)
Requested Prescriptions  Pending Prescriptions Disp Refills   Metoprolol Tartrate 75 MG TABS 180 tablet 0    Sig: Take 1 tablet (75 mg total) by mouth 2 (two) times daily.     Cardiovascular:  Beta Blockers Failed - 03/19/2023 11:12 AM      Failed - Last BP in normal range    BP Readings from Last 1 Encounters:  02/27/23 (!) 159/72         Passed - Last Heart Rate in normal range    Pulse Readings from Last 1 Encounters:  02/27/23 68         Passed - Valid encounter within last 6 months    Recent Outpatient Visits           2 months ago Dizziness   [facility] [place], Jennette Kettle, FNP   2 months ago Encounter for subsequent annual wellness visit (AWV) in Medicare patient   [facility] Merita Norton T, FNP   4 months ago Recurrent UTI   [facility] Merita Norton T, FNP   5 months ago Cystitis with hematuria   [facility] Beryle Flock, Marzella Schlein, MD   1 year ago Weight loss, unintentional   [facility] Jacky Kindle, [place]

## 2023-03-21 DIAGNOSIS — J40 Bronchitis, not specified as acute or chronic: Secondary | ICD-10-CM | POA: Diagnosis not present

## 2023-03-21 DIAGNOSIS — J90 Pleural effusion, not elsewhere classified: Secondary | ICD-10-CM | POA: Diagnosis not present

## 2023-03-21 DIAGNOSIS — J449 Chronic obstructive pulmonary disease, unspecified: Secondary | ICD-10-CM | POA: Diagnosis not present

## 2023-03-21 DIAGNOSIS — R052 Subacute cough: Secondary | ICD-10-CM | POA: Diagnosis not present

## 2023-03-21 DIAGNOSIS — G4733 Obstructive sleep apnea (adult) (pediatric): Secondary | ICD-10-CM | POA: Diagnosis not present

## 2023-04-04 ENCOUNTER — Telehealth: Payer: Self-pay | Admitting: Family

## 2023-04-04 NOTE — Telephone Encounter (Signed)
 Pt confirmed appt for 04/05/23

## 2023-04-04 NOTE — Progress Notes (Unsigned)
 Advanced Heart Failure Clinic Note   Referring provider: Merita Norton, Karie Schwalbe, FNP PCP: Jacky Kindle, FNP (last seen 11/24) Cardiologist: Dorothyann Peng, MD (last seen 04/24)  Chief Complaint: shortness of breath  HPI:  Christina [name] is a 88 y/o female with a history of OSA, RA, multivessel CAD, s/p CABG x3 (2006), paroxysmal atrial fibrillation, moderate tricuspid regurgitation, HFpEF, hypertension, hyperlipidemia, bilateral carotid artery atherosclerosis, peripheral arterial disease, PVD s/p stenting to the lower extremity, osteoarthritis, COPD, CKD stage IIIa, gastritis, Barrett's esophagus, s/p paraesophageal hernia repair (09/05/2021) that was complicated by postop right-sided pleural effusion and esophageal leak.   Admitted 09/05/21 for a robotic assisted laparoscopic paraesophageal hernia repair with Nissen fundoplication (Dr Everlene Farrier, 09/05/2021). Post-operatively, patient was doing well on POD1. UGI was obtained which was reassuring. On POD2, she did develop low grade fever to 100.79F. Underwent extensive work up including CT Chest, which was concerning for right pleural effusion but also potentially showed esophageal leak. Interventional radiology was consulted and placed chest tube (08/03). She underwent repeat CT Chest (Esophagram) which did not show esophageal leak. She was kept NPO as a precaution. Underwent repeat UGI on 08/07 which was negative and diet was restarted. Chest tube output was trended. Chylothorax was ruled out. She was found to have exudative pleural effusion vis Light's Criteria. Pulmonology was consulted on 08/11. On 08/14, her CXR remained reassuring and output was under <200 ccs. Chest tube was removed. The remainder of patient's hospital course was essentially unremarkable and she was discharged after 13 days.  Echo 12/12/17: EF >55% with Grade I DD Echo 06/20/20: EF >55% with mild LAE, moderate MR, severe TR Echo 04/10/21: EF 50-55% with mild LVH, moderate LAE/RAE, mild/  moderate MR, severe TR  Echo 09/10/21: EF 55-60% with mild LVH, Grade I DD, moderate LAE/RAE, trivial MR, moderate TR Echo 02/20/23: EF 60-65% with moderately elevated PA pressure of 52.9 mmHg, moderate LAE, mild/ moderate MR, severe TR  She presents today, with her husband of 65 years, for a HF follow-up visit with a chief complaint of moderate shortness of breath. Was able to walk from the front door of the building into the office with some SOB. Has associated fatigue, occasional palpitations, abdominal distention, occasional dizziness and chronic difficulty sleeping along with this. Denies chest pain, cough or pedal edema.    Wearing CPAP nightly. Not weighing daily and does like to add salt.    At last visit, hydrochlorothiazide was stopped and torsemide 20mg  daily was started. She says that she doesn't notice any issues with the torsemide but admits that she doesn't take it if they have a full day of appointments. She also says that she "occasionally" misses her evening dose of medication although her husband says that it happens "all the time". Does use a pillbox which is how she knows she forget her meds.   Reviewed echo results with patient and husband.   ROS: All systems negative except as listed in HPI, PMH and Problem List.  SH:  Social History   Socioeconomic History   Marital status: Married    Spouse name: Christina [name]   Number of children: 2   Years of education: 12   Highest education level: 12th grade  Occupational History   Occupation: retired  Tobacco Use   Smoking status: Former    Current packs/day: 0.00    Average packs/day: 0.5 packs/day for 5.0 years (2.5 ttl pk-yrs)    Types: Cigarettes    Start date: 04/25/1999  Quit date: 04/24/2004    Years since quitting: 18.9    Passive exposure: Past   Smokeless tobacco: Never  Vaping Use   Vaping status: Never Used  Substance and Sexual Activity   Alcohol use: No   Drug use: No   Sexual activity: Yes   Other Topics Concern   Not on file  Social History Narrative   Not on file   Social Drivers of Health   Financial Resource Strain: Low Risk  (12/27/2022)   Overall Financial Resource Strain (CARDIA)    Difficulty of Paying Living Expenses: Not hard at all  Food Insecurity: No Food Insecurity (12/27/2022)   Hunger Vital Sign    Worried About Running Out of Food in the Last Year: Never true    Ran Out of Food in the Last Year: Never true  Transportation Needs: No Transportation Needs (12/27/2022)   PRAPARE - Administrator, Civil Service (Medical): No    Lack of Transportation (Non-Medical): No  Physical Activity: Inactive (12/27/2022)   Exercise Vital Sign    Days of Exercise per Week: 0 days    Minutes of Exercise per Session: 0 min  Stress: Stress Concern Present (12/27/2022)   [employer] of Occupational Health - Occupational Stress Questionnaire    Feeling of Stress : Very much  Social Connections: Moderately Integrated (12/27/2022)   Social Connection and Isolation Panel [NHANES]    Frequency of Communication with Friends and Family: Three times a week    Frequency of Social Gatherings with Friends and Family: More than three times a week    Attends Religious Services: More than 4 times per year    Active Member of [employer] or Organizations: No    Attends Banker Meetings: Never    Marital Status: Married  Catering manager Violence: Not At Risk (12/27/2022)   Humiliation, Afraid, Rape, and Kick questionnaire    Fear of Current or Ex-Partner: No    Emotionally Abused: No    Physically Abused: No    Sexually Abused: No    FH:  Family History  Problem Relation Age of Onset   Hypertension Mother    Hyperlipidemia Mother    Alzheimer's disease Mother    CAD Mother    Heart attack Father    Lung disease Sister    Heart disease Sister    Diabetes Paternal Grandfather        Type 2   Hearing loss Son     Past Medical History:   Diagnosis Date   Adenomatous colon polyp    Anemia    Aortic atherosclerosis (HCC)    Arthritis    Barrett's esophagus    Bilateral carotid artery disease (HCC)    a.) carotid doppler 07/18/2020: 1-39% RICA, 60-79% LICA; b.) carotid doppler 08/15/2020: 70-99% LICA, no sig RICA; c.) carotid doppler 01/23/2021: 1-39% RICA, 40-59% LICA; d.) carotid doppler 08/03/2021: 40-59% LICA, near norm RICA   CKD (chronic kidney disease), stage III (HCC)    COPD (chronic obstructive pulmonary disease) (HCC)    Coronary artery disease 04/21/2004   a.) LHC 04/21/2004 [facility]): EF 66%, 50% dLCx, 75% pLCx, 75% oRCA, 50% pRCA --> transferred to [facility] for CVTS consult; b.) 3v CABG   Dyspnea on exertion    GERD (gastroesophageal reflux disease)    Hiatal hernia    Hyperlipidemia    Hypertension    Long term current use of anticoagulant    a.) reduced dose apixaban   Macular degeneration  OSA on CPAP    PAD (peripheral artery disease) (HCC) 12/10/2016   a.) PTA of BILATERAL common iliac arteries --> 7 x 26 mm stents placed   PAF (paroxysmal atrial fibrillation) (HCC)    a.) CHA2DS2-VASc = 6 (age x 2, sex, HTN, vascular disease, T2DM);  b.) rate/rhythm maintained on oral metoprolol tartrate; chronically anticoagulated using dose reduced apixaban   Peripheral edema    PSVT (paroxysmal supraventricular tachycardia) (HCC)    S/P CABG x 3 04/2004   Schatzki's ring    Scoliosis of thoracolumbar spine    Valvular heart disease    a.) TTE 04/20/2004: EF >55%, mild MR; b.)  TTE 04/10/2021: EF 50-55%, LVH, reduced RV SF, RVE, moderate BAE, mild-mod MR, severe TR.    Current Outpatient Medications  Medication Sig Dispense Refill   acetaminophen (TYLENOL) 500 MG tablet Take 500 mg by mouth every 8 (eight) hours as needed for mild pain or moderate pain.     apixaban (ELIQUIS) 2.5 MG TABS tablet Take by mouth 2 (two) times daily.     Ascorbic Acid (VITAMIN C) 1000 MG tablet Take 1,000 mg by mouth 2 (two)  times daily.     azelastine (ASTELIN) 0.1 % nasal spray Place 1 spray into both nostrils daily as needed.     carboxymethylcellulose (REFRESH PLUS) 0.5 % SOLN 1 drop 3 (three) times daily as needed.     hydroxychloroquine (PLAQUENIL) 200 MG tablet Take 200 mg by mouth daily.     ipratropium-albuterol (DUONEB) 0.5-2.5 (3) MG/3ML SOLN Take 3 mLs by nebulization 2 (two) times daily.     Metoprolol Tartrate 75 MG TABS Take 1 tablet (75 mg total) by mouth 2 (two) times daily. 180 tablet 0   prednisoLONE acetate (PRED FORTE) 1 % ophthalmic suspension Place 1 drop into both eyes every 2 (two) hours while awake.     rosuvastatin (CRESTOR) 20 MG tablet TAKE 1 TABLET BY MOUTH EVERY DAY IN THE EVENING 90 tablet 1   spironolactone (ALDACTONE) 25 MG tablet Take 1 tablet (25 mg total) by mouth daily. 90 tablet 3   tobramycin (TOBREX) 0.3 % ophthalmic solution   5   torsemide (DEMADEX) 20 MG tablet Take 20 mg by mouth daily.     No current facility-administered medications for this visit.   There were no vitals filed for this visit.  Wt Readings from Last 3 Encounters:  02/27/23 155 lb (70.3 kg)  01/09/23 162 lb 6 oz (73.7 kg)  01/02/23 159 lb 9.6 oz (72.4 kg)   Lab Results  Component Value Date   CREATININE 1.00 02/27/2023   CREATININE 1.27 (H) 12/27/2022   CREATININE 1.24 (H) 01/15/2022    PHYSICAL EXAM:  General:  Well appearing. No resp difficulty HEENT: normal Neck: supple. JVP flat. Carotids 2+ bilaterally; no bruits. No lymphadenopathy or thryomegaly appreciated. Cor: PMI normal. Regular rate & irregular rhythm. No rubs, gallops or murmurs. Lungs: clear Abdomen: soft, nontender, nondistended. No hepatosplenomegaly. No bruits or masses. Good bowel sounds. Extremities: no cyanosis, clubbing, rash, edema Neuro: alert & oriented x3, cranial nerves grossly intact. Moves all 4 extremities w/o difficulty. Affect pleasant.   ECG: not done   ASSESSMENT & PLAN:  1: NICM with preserved  ejection fraction- - suspect due to HTN/ OSA; CABG in 2006 - NYHA class III - euvolemic - brought her weight log although is not weighing daily; weight ranges from 150-155 lbs; reminded to call for overnight weight gain of > 2 pounds or a weekly  weight gain of > 5 pounds - weight down 7 pounds from last visit here 6 weeks ago - Echo 12/12/17: EF >55% with Grade I DD - Echo 06/20/20: EF >55% with mild LAE, moderate MR, severe TR - Echo 04/10/21: EF 50-55% with mild LVH, moderate LAE/RAE, mild/ moderate MR, severe TR  - Echo 09/10/21: EF 55-60% with mild LVH, Grade I DD, moderate LAE/RAE, trivial MR, moderate TR - Echo 02/20/23: EF 60-65% with moderately elevated PA pressure of 52.9 mmHg, moderate LAE, mild/ moderate MR, severe TR - continue torsemide 20mg  daily - discussed MRA/ SGLT2 but she is concerned about the cost of SGLT2 - begin spironolactone 25mg  daily - will have pharmacy look into cost of SGLT2 and whether she would qualify for a grant - BMET today and again at next visit - wear compression socks daily with removal at bedtime - reviewed the importance of not adding salt and reading food labels for sodium content - BNP 12/06/21 was 337.3  2: HTN- - BP 159/72 (improved from last visit) - starting spironolactone 25mg  daily - saw PCP Ephriam Knuckles) 11/24 - BMP 12/27/22 showed sodium 139, potassium 4.5, creatinine 1.27 & GFR 41 - BMET today  3: CAD- - saw cardiology Juliann Pares) 04/24; she says that she doesn't intend to return to him - CABG X 3 in 2006  4: Hyperlipidemia- - LDL 12/27/22 was 56 - continue rosuvastatin 20mg  daily  5: COPD/ OSA- - saw pulmonology Karna Christmas) 08/24 - wearing CPAP nightly  6: PAF- - continue apixaban 2.5mg  BID although it sounds like she regularly forgets the PM dose; may need to switch this to xarelto for compliance - continue metoprolol tartrate 75mg  BID; may need to change this to succinate for compliance - will defer these changes since adding spiro  today and she admits that she gets overwhelmed/ anxious with too many changes at one time  7: Pulmonary HTN- - Echo 02/20/23: EF 60-65% with moderately elevated PA pressure of 52.9 mmHg, moderate LAE, mild/ moderate MR, severe TR - discussed results w/ her and about her returning to Dr. Juliann Pares - she says that she has no intention of returning to his office and wants to continue f/u here - will have her see HF MD at next visit to discuss whether any treatment of pHTN needs to occur  Return in 2 weeks, sooner if needed.

## 2023-04-05 ENCOUNTER — Encounter: Payer: Self-pay | Admitting: Family

## 2023-04-05 ENCOUNTER — Ambulatory Visit: Payer: Medicare Other | Attending: Family | Admitting: Family

## 2023-04-05 ENCOUNTER — Other Ambulatory Visit (HOSPITAL_COMMUNITY): Payer: Self-pay

## 2023-04-05 ENCOUNTER — Other Ambulatory Visit: Admission: RE | Admit: 2023-04-05 | Payer: Medicare Other | Source: Ambulatory Visit

## 2023-04-05 VITALS — BP 157/76 | HR 78 | Wt 155.0 lb

## 2023-04-05 DIAGNOSIS — I5032 Chronic diastolic (congestive) heart failure: Secondary | ICD-10-CM | POA: Insufficient documentation

## 2023-04-05 DIAGNOSIS — I10 Essential (primary) hypertension: Secondary | ICD-10-CM | POA: Diagnosis not present

## 2023-04-05 DIAGNOSIS — I428 Other cardiomyopathies: Secondary | ICD-10-CM | POA: Insufficient documentation

## 2023-04-05 DIAGNOSIS — K449 Diaphragmatic hernia without obstruction or gangrene: Secondary | ICD-10-CM | POA: Diagnosis not present

## 2023-04-05 DIAGNOSIS — I2510 Atherosclerotic heart disease of native coronary artery without angina pectoris: Secondary | ICD-10-CM | POA: Insufficient documentation

## 2023-04-05 DIAGNOSIS — E785 Hyperlipidemia, unspecified: Secondary | ICD-10-CM | POA: Diagnosis not present

## 2023-04-05 DIAGNOSIS — J449 Chronic obstructive pulmonary disease, unspecified: Secondary | ICD-10-CM | POA: Diagnosis not present

## 2023-04-05 DIAGNOSIS — E7800 Pure hypercholesterolemia, unspecified: Secondary | ICD-10-CM | POA: Diagnosis not present

## 2023-04-05 DIAGNOSIS — R002 Palpitations: Secondary | ICD-10-CM | POA: Insufficient documentation

## 2023-04-05 DIAGNOSIS — Z7901 Long term (current) use of anticoagulants: Secondary | ICD-10-CM | POA: Insufficient documentation

## 2023-04-05 DIAGNOSIS — R0602 Shortness of breath: Secondary | ICD-10-CM | POA: Insufficient documentation

## 2023-04-05 DIAGNOSIS — Z79899 Other long term (current) drug therapy: Secondary | ICD-10-CM | POA: Diagnosis not present

## 2023-04-05 DIAGNOSIS — Z87891 Personal history of nicotine dependence: Secondary | ICD-10-CM | POA: Insufficient documentation

## 2023-04-05 DIAGNOSIS — N1831 Chronic kidney disease, stage 3a: Secondary | ICD-10-CM | POA: Insufficient documentation

## 2023-04-05 DIAGNOSIS — I130 Hypertensive heart and chronic kidney disease with heart failure and stage 1 through stage 4 chronic kidney disease, or unspecified chronic kidney disease: Secondary | ICD-10-CM | POA: Diagnosis not present

## 2023-04-05 DIAGNOSIS — M1990 Unspecified osteoarthritis, unspecified site: Secondary | ICD-10-CM | POA: Insufficient documentation

## 2023-04-05 DIAGNOSIS — Z951 Presence of aortocoronary bypass graft: Secondary | ICD-10-CM | POA: Insufficient documentation

## 2023-04-05 DIAGNOSIS — G4733 Obstructive sleep apnea (adult) (pediatric): Secondary | ICD-10-CM | POA: Diagnosis not present

## 2023-04-05 DIAGNOSIS — Z7984 Long term (current) use of oral hypoglycemic drugs: Secondary | ICD-10-CM | POA: Insufficient documentation

## 2023-04-05 DIAGNOSIS — I272 Pulmonary hypertension, unspecified: Secondary | ICD-10-CM | POA: Diagnosis not present

## 2023-04-05 DIAGNOSIS — I739 Peripheral vascular disease, unspecified: Secondary | ICD-10-CM | POA: Insufficient documentation

## 2023-04-05 DIAGNOSIS — I480 Paroxysmal atrial fibrillation: Secondary | ICD-10-CM | POA: Diagnosis not present

## 2023-04-05 NOTE — Patient Instructions (Signed)
 Great to see you today!!!  Medication Changes:  None, continue current medications  Lab Work:  Labs are needed today-->  Go DOWN to LOWER LEVEL (LL) to have your blood work completed inside of [employer] office. We will only call you if the results are abnormal or if the provider would like to make medication changes.  Labs done today, your results will be available in MyChart, we will contact you for abnormal readings.  Special Instructions // Education:  Do the following things EVERYDAY: Weigh yourself in the morning before breakfast. Write it down and keep it in a log. Take your medicines as prescribed Eat low salt foods--Limit salt (sodium) to 2000 mg per day.  Stay as active as you can everyday Limit all fluids for the day to less than 2 liters   Follow-Up in: 3 weeks    If you have any questions or concerns before your next appointment please send [place] a message through mychart or call our office at [phone] Monday-Friday 8 am-5 pm.   If you have an urgent need after hours on the weekend please call your Primary Cardiologist or the Advanced Heart Failure Clinic in [place] at [phone].   At the Advanced [facility] Failure Clinic, you and your health needs are our priority. We have a designated team specialized in the treatment of Heart Failure. This Care Team includes your primary Heart Failure Specialized Cardiologist (physician), Advanced Practice Providers (APPs- Physician Assistants and Nurse Practitioners), and Pharmacist who all work together to provide you with the care you need, when you need it.   You may see any of the following providers on your designated Care Team at your next follow up:  Dr. Arvilla Meres Dr. Marca Ancona Dr. Dorthula Nettles Dr. Theresia Bough Tonye Becket, NP Robbie Lis, [place] [address] [place], [place] Brynda Peon, NP [place] Lee, NP Clarisa Kindred, NP Enos Fling, PharmD

## 2023-04-06 LAB — BASIC METABOLIC PANEL
BUN/Creatinine Ratio: 19 (ref 12–28)
BUN: 23 mg/dL (ref 8–27)
CO2: 22 mmol/L (ref 20–29)
Calcium: 9.2 mg/dL (ref 8.7–10.3)
Chloride: 104 mmol/L (ref 96–106)
Creatinine, Ser: 1.21 mg/dL — ABNORMAL HIGH (ref 0.57–1.00)
Glucose: 114 mg/dL — ABNORMAL HIGH (ref 70–99)
Potassium: 5.2 mmol/L (ref 3.5–5.2)
Sodium: 141 mmol/L (ref 134–144)
eGFR: 44 mL/min/{1.73_m2} — ABNORMAL LOW (ref >59)

## 2023-04-25 ENCOUNTER — Encounter: Payer: Medicare Other | Admitting: Cardiology

## 2023-04-25 ENCOUNTER — Telehealth: Payer: Self-pay | Admitting: Cardiology

## 2023-04-25 NOTE — Telephone Encounter (Signed)
 Pt confirmed appt 04/26/23

## 2023-04-26 ENCOUNTER — Ambulatory Visit: Payer: Medicare Other | Attending: Cardiology | Admitting: Cardiology

## 2023-04-26 ENCOUNTER — Encounter: Payer: Self-pay | Admitting: Cardiology

## 2023-04-26 DIAGNOSIS — I10 Essential (primary) hypertension: Secondary | ICD-10-CM

## 2023-04-26 MED ORDER — METOPROLOL TARTRATE 50 MG PO TABS: 50.0000 mg | ORAL_TABLET | Freq: Two times a day (BID) | ORAL | 3 refills | Status: DC

## 2023-04-26 MED ORDER — LOSARTAN POTASSIUM 50 MG PO TABS: 50.0000 mg | ORAL_TABLET | Freq: Every day | ORAL | 3 refills | Status: DC

## 2023-04-26 MED ORDER — APIXABAN 2.5 MG PO TABS: 2.5000 mg | ORAL_TABLET | Freq: Two times a day (BID) | ORAL | Status: DC

## 2023-04-26 NOTE — Progress Notes (Unsigned)
   ADVANCED HEART FAILURE FOLLOW UP CLINIC NOTE  Referring Physician: Jacky Kindle, FNP  Primary Care: Jacky Kindle, FNP Primary Cardiologist:  HPI: Christina [name] is a 88 y.o. female who presents for follow up of chronic diastolic heart failure.      Admitted 09/05/21 for a robotic assisted laparoscopic paraesophageal hernia repair with Nissen fundoplication (Dr Everlene Farrier, 09/05/2021) Interventional radiology was consulted for post op pleural effusion and placed chest tube (08/03). She was found to have exudative pleural effusion vis Light's Criteria. Pulmonology was consulted on 08/11. On 08/14, her CXR remained reassuring and output was under <200 ccs. Chest tube was removed.      SUBJECTIVE:  Patient continues to have moderate shortness of breath with minimal exertion.  She has been stressed taking care of her husband as well as her worsening symptoms.  PMH, current medications, allergies, social history, and family history reviewed in epic.  PHYSICAL EXAM: Vitals:   04/26/23 1033  BP: (!) 158/76  Pulse: 79  SpO2: 99%   GENERAL: Well nourished and in no apparent distress at rest.  PULM:  Normal work of breathing, clear to auscultation bilaterally. Respirations are unlabored.  CARDIAC:  JVP: ***         Normal rate with regular rhythm. No murmurs, rubs or gallops.  *** edema. Warm and well perfused extremities. ABDOMEN: Soft, non-tender, non-distended. NEUROLOGIC: Patient is oriented x3 with no focal or lateralizing neurologic deficits.    DATA REVIEW  ECG: ***    ECHO: Echo 12/12/17: EF >55% with Grade I DD Echo 06/20/20: EF >55% with mild LAE, moderate MR, severe TR Echo 04/10/21: EF 50-55% with mild LVH, moderate LAE/RAE, mild/ moderate MR, severe TR  Echo 09/10/21: EF 55-60% with mild LVH, Grade I DD, moderate LAE/RAE, trivial MR, moderate TR Echo 02/20/23: EF 60-65% with moderately elevated PA pressure of 52.9 mmHg, moderate LAE, mild/ moderate MR, severe TR    CATH: ***   Heart failure review: - Classification: {HFCLASS:30917} - Etiology: {Cardiomyopathy:30918} - NYHA Class:  - Volume status: {volumechf:30919} - ACEi/ARB/ARNI: {HF:30752} - Aldosterone antagonist: {HF:30752} - Beta-blocker: {HF:30752} - Digoxin: {HF:30752} - Hydralazine/Nitrates: {HF:30752} - SGLT2i: {HF:30752} - GLP-1: {GLP:30906} - Advanced therapies: {Advancedtherapies:30916} - ICD: {ICD:30901}  ASSESSMENT & PLAN:  Chronic HFpEF:  HTN:   CAD:  COPD:  PAF:  Follow up in ***  Clearnce Hasten, MD Advanced Heart Failure Mechanical Circulatory Support 04/26/23

## 2023-04-26 NOTE — Patient Instructions (Signed)
 Medication Changes:  START Losartan 50mg  (1 tab) daily  DECREASE Metoprolol 50mg  (1 tab) two times daily   Be on the look out for a phone call from our Pharmacist Nason Wise. He will be in contact with you about your Eliquis.  Follow-Up in: Please follow up with the Advanced Heart Failure Clinic in 3 months with Dr. Elwyn Lade.   At the Advanced Heart Failure Clinic, you and your health needs are our priority. We have a designated team specialized in the treatment of Heart Failure. This Care Team includes your primary Heart Failure Specialized Cardiologist (physician), Advanced Practice Providers (APPs- Physician Assistants and Nurse Practitioners), and Pharmacist who all work together to provide you with the care you need, when you need it.   You may see any of the following providers on your designated Care Team at your next follow up:  Dr. Arvilla Meres Dr. Marca Ancona Dr. Dorthula Nettles Dr. Theresia Bough Clarisa Kindred, FNP Enos Fling, RPH-CPP  Please be sure to bring in all your medications bottles to every appointment.   Need to Contact us:  If you have any questions or concerns before your next appointment please send [place] a message through [place] or call our office at [phone].    TO LEAVE A MESSAGE FOR THE NURSE SELECT OPTION 2, PLEASE LEAVE A MESSAGE INCLUDING: YOUR NAME DATE OF BIRTH CALL BACK NUMBER REASON FOR CALL**this is important as we prioritize the call backs  YOU WILL RECEIVE A CALL BACK THE SAME DAY AS LONG AS YOU CALL BEFORE 4:00 PM

## 2023-04-29 ENCOUNTER — Ambulatory Visit: Payer: Self-pay

## 2023-04-29 ENCOUNTER — Telehealth (HOSPITAL_COMMUNITY): Payer: Self-pay | Admitting: Pharmacist

## 2023-04-29 DIAGNOSIS — Z03818 Encounter for observation for suspected exposure to other biological agents ruled out: Secondary | ICD-10-CM | POA: Diagnosis not present

## 2023-04-29 DIAGNOSIS — J449 Chronic obstructive pulmonary disease, unspecified: Secondary | ICD-10-CM | POA: Diagnosis not present

## 2023-04-29 DIAGNOSIS — R062 Wheezing: Secondary | ICD-10-CM | POA: Diagnosis not present

## 2023-04-29 DIAGNOSIS — J40 Bronchitis, not specified as acute or chronic: Secondary | ICD-10-CM | POA: Diagnosis not present

## 2023-04-29 NOTE — Telephone Encounter (Signed)
 Chief Complaint: SOB  Symptoms: Moderate to Severe SOB and wheezing  Frequency: Constant since last night Pertinent Negatives: Patient denies chest pain, fever Disposition: [] ED /[x] Urgent Care (no appt availability in office) / [] Appointment(In office/virtual)/ []  [place] [facility]/ [] Home Care/ [] Refused Recommended Disposition /[] [place] [facility]/ []  Follow-up with PCP Additional Notes: Patient states she is experiencing SOB and wheezing that is worse than her baseline. Patient reports a Hx of COPD and believes this is a flare up of her COPD. Patient reports a cough and runny nose as well.  Patient COPD is managed by Pulmonology at [facility]. Care advice was given and no appointments available in a location the patient was willing to travel to. Advised patient to call [facility] clinic to report symptoms if appointment is not available go to urgent care. Patient stated she would seek care at the [facility] walk-in clinic when her husband gets home from work in the next 30 minutes. Advised she will need to establish care with a new PCP and patient state she will callback to schedule  Copied from CRM [id number]. Topic: Clinical - Red Word Triage >> Apr 29, 2023 12:09 PM [place] B wrote: [facility] that prompted transfer to Nurse Triage: pt has sob and wheezing. Reason for Disposition  [1] Longstanding difficulty breathing (e.g., CHF, COPD, emphysema) AND [2] WORSE than normal  Answer Assessment - Initial Assessment Questions 1. RESPIRATORY STATUS: "Describe your breathing?" (e.g., wheezing, shortness of breath, unable to speak, severe coughing)      SOB and wheezing  2. ONSET: "When did this breathing problem begin?"      Yesterday  3. PATTERN "Does the difficult breathing come and go, or has it been constant since it started?"      Come and go 4. SEVERITY: "How bad is your breathing?" (e.g., mild, moderate, severe)    - MILD: No SOB at rest, mild SOB with walking, speaks normally in  sentences, can lie down, no retractions, pulse < 100.    - MODERATE: SOB at rest, SOB with minimal exertion and prefers to sit, cannot lie down flat, speaks in phrases, mild retractions, audible wheezing, pulse 100-120.    - SEVERE: Very SOB at rest, speaks in single words, struggling to breathe, sitting hunched forward, retractions, pulse > 120      Moderate-Severe  5. RECURRENT SYMPTOM: "Have you had difficulty breathing before?" If Yes, ask: "When was the last time?" and "What happened that time?"      Yes, I have COPD  6. CARDIAC HISTORY: "Do you have any history of heart disease?" (e.g., heart attack, angina, bypass surgery, angioplasty)      N/A 7. LUNG HISTORY: "Do you have any history of lung disease?"  (e.g., pulmonary embolus, asthma, emphysema)     COPD  8. CAUSE: "What do you think is causing the breathing problem?"      COPD flare up  9. OTHER SYMPTOMS: "Do you have any other symptoms? (e.g., dizziness, runny nose, cough, chest pain, fever)     Cough, runny nose  Protocols used: Breathing Difficulty-A-AH

## 2023-04-29 NOTE — Telephone Encounter (Signed)
 Pt returning call. Advised that pharmd has left for the day. We will reach back out to her. Pt verbalized understanding.

## 2023-04-29 NOTE — Telephone Encounter (Signed)
 Attempted to return call regarding Eliquis cost, however there was no reply. Can consider patient assistance pending income and out of pocket medication costs.

## 2023-04-30 ENCOUNTER — Other Ambulatory Visit (HOSPITAL_COMMUNITY): Payer: Self-pay

## 2023-04-30 ENCOUNTER — Telehealth (HOSPITAL_COMMUNITY): Payer: Self-pay | Admitting: Pharmacist

## 2023-04-30 DIAGNOSIS — H353221 Exudative age-related macular degeneration, left eye, with active choroidal neovascularization: Secondary | ICD-10-CM | POA: Diagnosis not present

## 2023-04-30 DIAGNOSIS — H353213 Exudative age-related macular degeneration, right eye, with inactive scar: Secondary | ICD-10-CM | POA: Diagnosis not present

## 2023-04-30 DIAGNOSIS — H35432 Paving stone degeneration of retina, left eye: Secondary | ICD-10-CM | POA: Diagnosis not present

## 2023-04-30 DIAGNOSIS — H04123 Dry eye syndrome of bilateral lacrimal glands: Secondary | ICD-10-CM | POA: Diagnosis not present

## 2023-04-30 DIAGNOSIS — H43813 Vitreous degeneration, bilateral: Secondary | ICD-10-CM | POA: Diagnosis not present

## 2023-04-30 DIAGNOSIS — H26491 Other secondary cataract, right eye: Secondary | ICD-10-CM | POA: Diagnosis not present

## 2023-04-30 NOTE — Telephone Encounter (Signed)
 Spoke with patient regarding Eliquis cost. She reports that she is not interested in applying for patient assistance and is "okay for now." Patient agreed to reach back out if she develops difficult affording Eliquis in the future.

## 2023-05-06 DIAGNOSIS — Z03818 Encounter for observation for suspected exposure to other biological agents ruled out: Secondary | ICD-10-CM | POA: Diagnosis not present

## 2023-05-06 DIAGNOSIS — J441 Chronic obstructive pulmonary disease with (acute) exacerbation: Secondary | ICD-10-CM | POA: Diagnosis not present

## 2023-05-06 DIAGNOSIS — R058 Other specified cough: Secondary | ICD-10-CM | POA: Diagnosis not present

## 2023-05-06 DIAGNOSIS — J309 Allergic rhinitis, unspecified: Secondary | ICD-10-CM | POA: Diagnosis not present

## 2023-05-22 DIAGNOSIS — E878 Other disorders of electrolyte and fluid balance, not elsewhere classified: Secondary | ICD-10-CM | POA: Diagnosis not present

## 2023-05-22 DIAGNOSIS — J449 Chronic obstructive pulmonary disease, unspecified: Secondary | ICD-10-CM | POA: Diagnosis not present

## 2023-05-22 DIAGNOSIS — G4733 Obstructive sleep apnea (adult) (pediatric): Secondary | ICD-10-CM | POA: Diagnosis not present

## 2023-05-22 DIAGNOSIS — J90 Pleural effusion, not elsewhere classified: Secondary | ICD-10-CM | POA: Diagnosis not present

## 2023-06-15 ENCOUNTER — Other Ambulatory Visit: Payer: Self-pay | Admitting: Family Medicine

## 2023-06-15 DIAGNOSIS — I10 Essential (primary) hypertension: Secondary | ICD-10-CM

## 2023-06-17 NOTE — Telephone Encounter (Signed)
 No longer current dosing of this medication Requested Prescriptions  Pending Prescriptions Disp Refills   Metoprolol  Tartrate 75 MG TABS [Pharmacy Med Name: METOPROLOL  TARTRATE 75 MG TAB] 180 tablet 0    Sig: TAKE 1 TABLET BY MOUTH TWICE A DAY     Cardiovascular:  Beta Blockers Failed - 06/17/2023  3:37 PM      Failed - Last BP in normal range    BP Readings from Last 1 Encounters:  04/26/23 (!) 158/76         Failed - Valid encounter within last 6 months    Recent Outpatient Visits   None     Future Appointments             In 6 months Christina Farr, FNP [facility]            Passed - Last Heart Rate in normal range    Pulse Readings from Last 1 Encounters:  04/26/23 79

## 2023-06-27 ENCOUNTER — Ambulatory Visit: Payer: Medicare Other | Admitting: Family Medicine

## 2023-07-04 DIAGNOSIS — D485 Neoplasm of uncertain behavior of skin: Secondary | ICD-10-CM | POA: Diagnosis not present

## 2023-07-04 DIAGNOSIS — L728 Other follicular cysts of the skin and subcutaneous tissue: Secondary | ICD-10-CM | POA: Diagnosis not present

## 2023-07-12 ENCOUNTER — Telehealth: Payer: Self-pay | Admitting: Family

## 2023-07-12 MED ORDER — APIXABAN 2.5 MG PO TABS: 2.5000 mg | ORAL_TABLET | Freq: Two times a day (BID) | ORAL | 5 refills | Status: DC

## 2023-07-12 NOTE — Progress Notes (Signed)
 Medication refills sent in to [facility] pharmacy for 6 month supply.

## 2023-07-23 DIAGNOSIS — H35432 Paving stone degeneration of retina, left eye: Secondary | ICD-10-CM | POA: Diagnosis not present

## 2023-07-23 DIAGNOSIS — H43813 Vitreous degeneration, bilateral: Secondary | ICD-10-CM | POA: Diagnosis not present

## 2023-07-23 DIAGNOSIS — H353213 Exudative age-related macular degeneration, right eye, with inactive scar: Secondary | ICD-10-CM | POA: Diagnosis not present

## 2023-07-23 DIAGNOSIS — H353221 Exudative age-related macular degeneration, left eye, with active choroidal neovascularization: Secondary | ICD-10-CM | POA: Diagnosis not present

## 2023-07-23 DIAGNOSIS — H04123 Dry eye syndrome of bilateral lacrimal glands: Secondary | ICD-10-CM | POA: Diagnosis not present

## 2023-07-29 ENCOUNTER — Ambulatory Visit: Attending: Cardiology | Admitting: Cardiology

## 2023-07-29 VITALS — BP 144/68 | HR 71 | Wt 160.0 lb

## 2023-07-29 DIAGNOSIS — Z7982 Long term (current) use of aspirin: Secondary | ICD-10-CM | POA: Insufficient documentation

## 2023-07-29 DIAGNOSIS — I110 Hypertensive heart disease with heart failure: Secondary | ICD-10-CM | POA: Diagnosis not present

## 2023-07-29 DIAGNOSIS — I5032 Chronic diastolic (congestive) heart failure: Secondary | ICD-10-CM | POA: Insufficient documentation

## 2023-07-29 DIAGNOSIS — Z7901 Long term (current) use of anticoagulants: Secondary | ICD-10-CM | POA: Diagnosis not present

## 2023-07-29 DIAGNOSIS — I081 Rheumatic disorders of both mitral and tricuspid valves: Secondary | ICD-10-CM | POA: Insufficient documentation

## 2023-07-29 DIAGNOSIS — J449 Chronic obstructive pulmonary disease, unspecified: Secondary | ICD-10-CM | POA: Insufficient documentation

## 2023-07-29 DIAGNOSIS — Z79899 Other long term (current) drug therapy: Secondary | ICD-10-CM | POA: Diagnosis not present

## 2023-07-29 DIAGNOSIS — Z7951 Long term (current) use of inhaled steroids: Secondary | ICD-10-CM | POA: Insufficient documentation

## 2023-07-29 DIAGNOSIS — I2510 Atherosclerotic heart disease of native coronary artery without angina pectoris: Secondary | ICD-10-CM | POA: Insufficient documentation

## 2023-07-29 NOTE — Patient Instructions (Signed)
 Medication Changes:  No medication changes.   Follow-Up in: 5 months with Dr. Zenaida.  Our Doctors' schedules are NOT open yet for 5 months. We will place you on our recall list. Once they are available, we will call you to schedule your follow up appointment.   At the [facility], you and your health needs are our priority. We have a designated team specialized in the treatment of Heart Failure. This Care Team includes your primary Heart Failure Specialized Cardiologist (physician), Advanced Practice Providers (APPs- Physician Assistants and Nurse Practitioners), and Pharmacist who all work together to provide you with the care you need, when you need it.   You may see any of the following providers on your designated Care Team at your next follow up:  Dr. Toribio Fuel Dr. Ezra Shuck Dr. Ria Commander Dr. Odis Zenaida Ellouise Class, FNP Jaun Bash, RPH-CPP  Please be sure to bring in all your medications bottles to every appointment.   Need to Contact Us :  If you have any questions or concerns before your next appointment please send us  a message through [place] or call our office at [phone].    TO LEAVE A MESSAGE FOR THE NURSE SELECT OPTION 2, PLEASE LEAVE A MESSAGE INCLUDING: YOUR NAME DATE OF BIRTH CALL BACK NUMBER REASON FOR CALL**this is important as we prioritize the call backs  YOU WILL RECEIVE A CALL BACK THE SAME DAY AS LONG AS YOU CALL BEFORE 4:00 PM

## 2023-08-01 NOTE — Progress Notes (Signed)
   ADVANCED HE[facility] FAILURE FOLLOW UP CLINIC NOTE  Referring Physician: Emilio Kelly DASEN, FNP  Primary Care: Emilio Kelly DASEN, FNP Primary Cardiologist:  HPI: Christina [name] is a 88 y.o. female who presents for follow up of chronic diastolic heart failure.      Admitted 09/05/21 for a robotic assisted laparoscopic paraesophageal hernia repair with Nissen fundoplication (Dr Jordis, 09/05/2021) Interventional radiology was consulted for post op pleural effusion and placed chest tube (08/03). She was found to have exudative pleural effusion vis Light's Criteria. Pulmonology was consulted on 08/11. On 08/14, her CXR remained reassuring and output was under <200 ccs. Chest tube was removed.      SUBJECTIVE:  Patient overall doing well. She denies any recent change in her symptoms and has been feeling better after the medication changes at her last visit. She was recently seen for basal cell carcinoma of her nose, follows with derm. She has no issues with the cost of her medications.   PMH, current medications, allergies, social history, and family history reviewed in epic.  PHYSICAL EXAM: Vitals:   07/29/23 1418  BP: (!) 144/68  Pulse: 71  SpO2: 96%   GENERAL: Well nourished and in no apparent distress at rest.  PULM:  Normal work of breathing, clear to auscultation bilaterally. Respirations are unlabored.  CARDIAC:  JVP: flat         Irregular rate with irregular rhythm. No murmurs, rubs or gallops.  Trace edema. Warm and well perfused extremities. ABDOMEN: Soft, non-tender, non-distended. NEUROLOGIC: Patient is oriented x3 with no focal or lateralizing neurologic deficits.    DATA REVIEW  ECG: 01/09/23: Afib with controlled rate ,normal QRS    ECHO: Echo 12/12/17: EF >55% with Grade I DD Echo 06/20/20: EF >55% with mild LAE, moderate MR, severe TR Echo 04/10/21: EF 50-55% with mild LVH, moderate LAE/RAE, mild/ moderate MR, severe TR  Echo 09/10/21: EF 55-60% with mild LVH, Grade I DD,  moderate LAE/RAE, trivial MR, moderate TR Echo 02/20/23: EF 60-65% with moderately elevated PA pressure of 52.9 mmHg, moderate LAE, mild/ moderate MR, severe TR   CATH: None    ASSESSMENT & PLAN:  Chronic HFpEF: Known disease, complicated by valvular disease as noted above. Stable NYHA Class III symptoms, no recent change in volume status. Feeing better after decrease in metoprolol .  - Continue losartasn 50mg  daily - Continue metoprolol  50mg  BID - Continue spironolactone  25mg  daily - No SGLT-2 with UTI - Continue torsemide  20mg  daily, euvolemic - Continue toresmide 20mg  daily  HTN: Reports better control at home. - BP meds as above  CAD: S/p CABG x3 in 2006, no recent anginal symptoms. - Apixaban , crestor  20mg  daily, no aspirin   COPD: Management per PCP. - Continue duonebs  PAF:  Currently in NSR.  - Continue apixaban  2.5mg  BID  Follow up in 4-5 months   Morene Brownie, MD [facility] Mechanical Circulatory Support 08/01/23

## 2023-08-19 ENCOUNTER — Telehealth: Payer: Self-pay | Admitting: Family

## 2023-08-19 MED ORDER — SPIRONOLACTONE 25 MG PO TABS: 25.0000 mg | ORAL_TABLET | Freq: Every day | ORAL | 3 refills | Status: DC

## 2023-08-19 MED ORDER — TORSEMIDE 20 MG PO TABS: 20.0000 mg | ORAL_TABLET | Freq: Every day | ORAL | 3 refills | Status: DC

## 2023-08-19 MED ORDER — TORSEMIDE 20 MG PO TABS: 20.0000 mg | ORAL_TABLET | Freq: Every day | ORAL | 3 refills | Status: AC

## 2023-08-19 NOTE — Telephone Encounter (Signed)
 Torsemide  and spiro refills sent to preferred pharmacy

## 2023-08-20 ENCOUNTER — Ambulatory Visit (INDEPENDENT_AMBULATORY_CARE_PROVIDER_SITE_OTHER): Payer: Medicare Other

## 2023-08-20 ENCOUNTER — Ambulatory Visit (INDEPENDENT_AMBULATORY_CARE_PROVIDER_SITE_OTHER): Payer: Medicare Other | Admitting: Nurse Practitioner

## 2023-08-20 ENCOUNTER — Encounter (INDEPENDENT_AMBULATORY_CARE_PROVIDER_SITE_OTHER): Payer: Self-pay | Admitting: Nurse Practitioner

## 2023-08-20 VITALS — BP 157/70 | HR 86 | Resp 18 | Wt 155.0 lb

## 2023-08-20 DIAGNOSIS — I739 Peripheral vascular disease, unspecified: Secondary | ICD-10-CM

## 2023-08-20 DIAGNOSIS — E7800 Pure hypercholesterolemia, unspecified: Secondary | ICD-10-CM

## 2023-08-20 DIAGNOSIS — I6523 Occlusion and stenosis of bilateral carotid arteries: Secondary | ICD-10-CM

## 2023-08-20 DIAGNOSIS — M7989 Other specified soft tissue disorders: Secondary | ICD-10-CM

## 2023-08-20 DIAGNOSIS — I10 Essential (primary) hypertension: Secondary | ICD-10-CM

## 2023-08-20 NOTE — Progress Notes (Signed)
 Subjective:    Patient ID: Christina [name], female    DOB: [birth year], 88 y.o.   MRN: [id number] Chief Complaint  Patient presents with   Follow-up    28yr carotid follow up    The patient is seen for evaluation of carotid stenosis. The carotid stenosis was identified remotely but has not been followed recently.   The patient denies amaurosis fugax. There is no recent history of TIA symptoms or focal motor deficits. There is no prior documented CVA.   There is no history of migraine headaches. There is no history of seizures.   The patient is taking enteric-coated aspirin  81 mg daily.   The patient has a history of coronary artery disease, no recent episodes of angina or shortness of breath. The patient denies PAD or claudication symptoms. There is a history of hyperlipidemia which is being treated with a statin.     Duplex ultrasound of the carotid arteries shows RICA 1-39% and LICA 40-59% No significant change compared to last study.       Review of Systems  Cardiovascular:  Positive for leg swelling.  Musculoskeletal:  Positive for arthralgias.  All other systems reviewed and are negative.      Objective:   Physical Exam Vitals reviewed.  HENT:     Head: Normocephalic.  Neck:     Vascular: No carotid bruit.  Cardiovascular:     Rate and Rhythm: Normal rate. Rhythm irregular.     Pulses:          Radial pulses are 2+ on the right side and 2+ on the left side.  Pulmonary:     Effort: Pulmonary effort is normal.  Skin:    General: Skin is warm and dry.  Neurological:     Mental Status: She is alert and oriented to person, place, and time.  Psychiatric:        Mood and Affect: Mood normal.        Thought Content: Thought content normal.        Judgment: Judgment normal.     BP (!) 157/70   Pulse 86   Resp 18   Wt 155 lb (70.3 kg)   BMI 25.02 kg/m   Past Medical History:  Diagnosis Date   Adenomatous colon polyp    Anemia    Aortic  atherosclerosis (HCC)    Arthritis    Barrett's esophagus    Bilateral carotid artery disease (HCC)    a.) carotid doppler 07/18/2020: 1-39% RICA, 60-79% LICA; b.) carotid doppler 08/15/2020: 70-99% LICA, no sig RICA; c.) carotid doppler 01/23/2021: 1-39% RICA, 40-59% LICA; d.) carotid doppler 08/03/2021: 40-59% LICA, near norm RICA   CKD (chronic kidney disease), stage III (HCC)    COPD (chronic obstructive pulmonary disease) (HCC)    Coronary artery disease 04/21/2004   a.) LHC 04/21/2004 [facility]): EF 66%, 50% dLCx, 75% pLCx, 75% oRCA, 50% pRCA --> transferred to [facility] for CVTS consult; b.) 3v CABG   Dyspnea on exertion    GERD (gastroesophageal reflux disease)    Hiatal hernia    Hyperlipidemia    Hypertension    Long term current use of anticoagulant    a.) reduced dose apixaban    Macular degeneration    OSA on CPAP    PAD (peripheral artery disease) (HCC) 12/10/2016   a.) PTA of BILATERAL common iliac arteries --> 7 x 26 mm stents placed   PAF (paroxysmal atrial fibrillation) (HCC)    a.) CHA2DS2-VASc = 6 (  age x 2, sex, HTN, vascular disease, T2DM);  b.) rate/rhythm maintained on oral metoprolol  tartrate; chronically anticoagulated using dose reduced apixaban    Peripheral edema    PSVT (paroxysmal supraventricular tachycardia) (HCC)    S/P CABG x 3 04/2004   Schatzki's ring    Scoliosis of thoracolumbar spine    Valvular heart disease    a.) TTE 04/20/2004: EF >55%, mild MR; b.)  TTE 04/10/2021: EF 50-55%, LVH, reduced RV SF, RVE, moderate BAE, mild-mod MR, severe TR.    Social History   Socioeconomic History   Marital status: Married    Spouse name: Cliffton SABRINNA YEARWOOD   Number of children: 2   Years of education: 12   Highest education level: 12th grade  Occupational History   Occupation: retired  Tobacco Use   Smoking status: Former    Current packs/day: 0.00    Average packs/day: 0.5 packs/day for 5.0 years (2.5 ttl pk-yrs)    Types: Cigarettes    Start date:  04/25/1999    Quit date: 04/24/2004    Years since quitting: 19.3    Passive exposure: Past   Smokeless tobacco: Never  Vaping Use   Vaping status: Never Used  Substance and Sexual Activity   Alcohol  use: No   Drug use: No   Sexual activity: Yes  Other Topics Concern   Not on file  Social History Narrative   Not on file   Social Drivers of Health   Financial Resource Strain: Low Risk  (12/27/2022)   Overall Financial Resource Strain (CARDIA)    Difficulty of Paying Living Expenses: Not hard at all  Food Insecurity: No Food Insecurity (12/27/2022)   Hunger Vital Sign    Worried About Running Out of Food in the Last Year: Never true    Ran Out of Food in the Last Year: Never true  Transportation Needs: No Transportation Needs (12/27/2022)   PRAPARE - Administrator, Civil Service (Medical): No    Lack of Transportation (Non-Medical): No  Physical Activity: Inactive (12/27/2022)   Exercise Vital Sign    Days of Exercise per Week: 0 days    Minutes of Exercise per Session: 0 min  Stress: Stress Concern Present (12/27/2022)   [employer] of Occupational Health - Occupational Stress Questionnaire    Feeling of Stress : Very much  Social Connections: Moderately Integrated (12/27/2022)   Social Connection and Isolation Panel    Frequency of Communication with Friends and Family: Three times a week    Frequency of Social Gatherings with Friends and Family: More than three times a week    Attends Religious Services: More than 4 times per year    Active Member of [employer] or Organizations: No    Attends Banker Meetings: Never    Marital Status: Married  Catering manager Violence: Not At Risk (12/27/2022)   Humiliation, Afraid, Rape, and Kick questionnaire    Fear of Current or Ex-Partner: No    Emotionally Abused: No    Physically Abused: No    Sexually Abused: No    Past Surgical History:  Procedure Laterality Date   ABDOMINAL HYSTERECTOMY  1980    due to dysfunctional uterine bleeding   APPENDECTOMY  1980   BREAST SURGERY Left 2000   biopsy   CARDIAC CATHETERIZATION     COLONOSCOPY     CORONARY ARTERY BYPASS GRAFT  2006   ESOPHAGOGASTRODUODENOSCOPY (EGD) WITH PROPOFOL  N/A 06/08/2021   Procedure: ESOPHAGOGASTRODUODENOSCOPY (EGD) WITH PROPOFOL ;  Surgeon:  Therisa Bi, MD;  Location: [facility] ENDOSCOPY;  Service: Gastroenterology;  Laterality: N/A;   HEMORRHOID SURGERY     INSERTION OF MESH  09/05/2021   Procedure: INSERTION OF MESH;  Surgeon: Jordis Laneta FALCON, MD;  Location: [facility] ORS;  Service: General;;   LOWER EXTREMITY ANGIOGRAPHY Right 02/25/2017   Procedure: LOWER EXTREMITY ANGIOGRAPHY;  Surgeon: Marea Selinda RAMAN, MD;  Location: [facility] INVASIVE CV LAB;  Service: Cardiovascular;  Laterality: Right;   LOWER EXTREMITY INTERVENTION  02/25/2017   Procedure: LOWER EXTREMITY INTERVENTION;  Surgeon: Marea Selinda RAMAN, MD;  Location: [facility] INVASIVE CV LAB;  Service: Cardiovascular;;   LUNG BIOPSY  1999   Negative   TONSILLECTOMY     XI ROBOTIC ASSISTED PARAESOPHAGEAL HERNIA REPAIR N/A 09/05/2021   Procedure: XI ROBOTIC ASSISTED PARAESOPHAGEAL HERNIA REPAIR, RNFA to assist;  Surgeon: Jordis Laneta FALCON, MD;  Location: [facility] ORS;  Service: General;  Laterality: N/A;    Family History  Problem Relation Age of Onset   Hypertension Mother    Hyperlipidemia Mother    Alzheimer's disease Mother    CAD Mother    Heart attack Father    Lung disease Sister    Heart disease Sister    Diabetes Paternal Grandfather        Type 2   Hearing loss Son     Allergies  Allergen Reactions   Baclofen Other (See Comments)   Codeine Nausea Only   Plavix  [Clopidogrel ]     brusing all over   Sulfa Antibiotics Nausea Only   Latex Rash       Latest Ref Rng & Units 12/27/2022    2:40 PM 01/15/2022    2:13 PM 09/25/2021   11:58 AM  CBC  WBC 3.4 - 10.8 x10E3/uL 5.4  6.4  6.5   Hemoglobin 11.1 - 15.9 g/dL 86.7  87.5  89.9   Hematocrit 34.0 - 46.6 % 40.0  37.2  31.9    Platelets 150 - 450 x10E3/uL 125  195  549       CMP     Component Value Date/Time   NA 141 04/05/2023 1435   K 5.2 04/05/2023 1435   CL 104 04/05/2023 1435   CO2 22 04/05/2023 1435   GLUCOSE 114 (H) 04/05/2023 1435   GLUCOSE 87 02/27/2023 1146   BUN 23 04/05/2023 1435   CREATININE 1.21 (H) 04/05/2023 1435   CALCIUM  9.2 04/05/2023 1435   PROT 6.7 12/27/2022 1440   ALBUMIN  4.2 12/27/2022 1440   AST 30 12/27/2022 1440   ALT 17 12/27/2022 1440   ALKPHOS 169 (H) 12/27/2022 1440   BILITOT 1.2 12/27/2022 1440   EGFR 44 (L) 04/05/2023 1435   GFRNONAA 55 (L) 02/27/2023 1146     No results found.     Assessment & Plan:   1. Atherosclerosis of both carotid arteries (Primary) Recommend:   Given the patient's asymptomatic subcritical stenosis no further invasive testing or surgery at this time.   Duplex ultrasound shows 1-39% RICA and 40-59% LICA stenosis.  No change compared to previous study.   Continue antiplatelet therapy as prescribed Continue management of CAD, HTN and Hyperlipidemia Healthy heart diet,  encouraged exercise at least 4 times per week Follow up in 12 months with duplex ultrasound and physical exam    2. Primary hypertension Continue antihypertensive medications as already ordered, these medications have been reviewed and there are no changes at this time.  3. PAD (peripheral artery disease) (HCC) The patient denies any worsening symptoms for PAD  such as worsening claudication or ulceration.  We will continue to follow for worsening symptoms.  4. Pure hypercholesterolemia Continue statin as ordered and reviewed, no changes at this time  5. Swelling of limb The patient is fairly diligent with use of her medical grade compression stockings.  She has not worn them as much lately due to the heat but her swelling remains well-controlled.  Patient will continue with conservative therapies with compression elevation and activity.   Current Outpatient  Medications on File Prior to Visit  Medication Sig Dispense Refill   acetaminophen  (TYLENOL ) 500 MG tablet Take 500 mg by mouth every 8 (eight) hours as needed for mild pain or moderate pain.     apixaban  (ELIQUIS ) 2.5 MG TABS tablet Take 1 tablet (2.5 mg total) by mouth 2 (two) times daily. 60 tablet 5   Ascorbic Acid (VITAMIN C) 1000 MG tablet Take 1,000 mg by mouth 2 (two) times daily.     azelastine  (ASTELIN ) 0.1 % nasal spray Place 1 spray into both nostrils daily as needed.     benzonatate  (TESSALON ) 100 MG capsule Take 1 capsule by mouth 3 (three) times daily as needed.     carboxymethylcellulose (REFRESH PLUS) 0.5 % SOLN 1 drop 3 (three) times daily as needed.     hydroxychloroquine (PLAQUENIL) 200 MG tablet Take 200 mg by mouth daily.     ipratropium-albuterol  (DUONEB) 0.5-2.5 (3) MG/3ML SOLN Take 3 mLs by nebulization 2 (two) times daily.     losartan  (COZAAR ) 50 MG tablet Take 1 tablet (50 mg total) by mouth daily. 90 tablet 3   metoprolol  tartrate (LOPRESSOR ) 50 MG tablet Take 1 tablet (50 mg total) by mouth 2 (two) times daily. 180 tablet 3   rosuvastatin  (CRESTOR ) 20 MG tablet TAKE 1 TABLET BY MOUTH EVERY DAY IN THE EVENING 90 tablet 1   spironolactone  (ALDACTONE ) 25 MG tablet Take 1 tablet (25 mg total) by mouth daily. 90 tablet 3   torsemide  (DEMADEX ) 20 MG tablet Take 20 mg by mouth daily.     torsemide  (DEMADEX ) 20 MG tablet Take 20 mg by mouth daily.     torsemide  (DEMADEX ) 20 MG tablet Take 1 tablet (20 mg total) by mouth daily. 90 tablet 3   prednisoLONE acetate (PRED FORTE) 1 % ophthalmic suspension Place 1 drop into both eyes every 2 (two) hours while awake. (Patient not taking: Reported on 08/20/2023)     tobramycin  (TOBREX ) 0.3 % ophthalmic solution  (Patient not taking: Reported on 08/20/2023)  5   No current facility-administered medications on file prior to visit.    There are no Patient Instructions on file for this visit. No follow-ups on file.   Stillman Buenger E Estalene Bergey,  NP

## 2023-09-16 ENCOUNTER — Telehealth: Payer: Self-pay | Admitting: Family Medicine

## 2023-09-16 ENCOUNTER — Other Ambulatory Visit: Payer: Self-pay

## 2023-09-16 DIAGNOSIS — J302 Other seasonal allergic rhinitis: Secondary | ICD-10-CM | POA: Diagnosis not present

## 2023-09-16 DIAGNOSIS — B9689 Other specified bacterial agents as the cause of diseases classified elsewhere: Secondary | ICD-10-CM | POA: Diagnosis not present

## 2023-09-16 DIAGNOSIS — J441 Chronic obstructive pulmonary disease with (acute) exacerbation: Secondary | ICD-10-CM | POA: Diagnosis not present

## 2023-09-16 DIAGNOSIS — J209 Acute bronchitis, unspecified: Secondary | ICD-10-CM | POA: Diagnosis not present

## 2023-09-16 DIAGNOSIS — E7800 Pure hypercholesterolemia, unspecified: Secondary | ICD-10-CM

## 2023-09-16 DIAGNOSIS — J019 Acute sinusitis, unspecified: Secondary | ICD-10-CM | POA: Diagnosis not present

## 2023-09-16 MED ORDER — ROSUVASTATIN CALCIUM 20 MG PO TABS: 20.0000 mg | ORAL_TABLET | Freq: Every evening | ORAL | 0 refills | Status: DC

## 2023-09-16 NOTE — Telephone Encounter (Signed)
 [facility] faxed refill request for the following medications:   rosuvastatin (CRESTOR) 20 MG tablet    Please advise.

## 2023-09-24 DIAGNOSIS — R0602 Shortness of breath: Secondary | ICD-10-CM | POA: Diagnosis not present

## 2023-09-24 DIAGNOSIS — J441 Chronic obstructive pulmonary disease with (acute) exacerbation: Secondary | ICD-10-CM | POA: Diagnosis not present

## 2023-09-24 DIAGNOSIS — R058 Other specified cough: Secondary | ICD-10-CM | POA: Diagnosis not present

## 2023-10-08 DIAGNOSIS — M47816 Spondylosis without myelopathy or radiculopathy, lumbar region: Secondary | ICD-10-CM | POA: Diagnosis not present

## 2023-10-08 DIAGNOSIS — M7062 Trochanteric bursitis, left hip: Secondary | ICD-10-CM | POA: Diagnosis not present

## 2023-10-10 DIAGNOSIS — J302 Other seasonal allergic rhinitis: Secondary | ICD-10-CM | POA: Diagnosis not present

## 2023-10-10 DIAGNOSIS — R600 Localized edema: Secondary | ICD-10-CM | POA: Diagnosis not present

## 2023-10-10 DIAGNOSIS — J449 Chronic obstructive pulmonary disease, unspecified: Secondary | ICD-10-CM | POA: Diagnosis not present

## 2023-10-15 DIAGNOSIS — H35432 Paving stone degeneration of retina, left eye: Secondary | ICD-10-CM | POA: Diagnosis not present

## 2023-10-15 DIAGNOSIS — H04123 Dry eye syndrome of bilateral lacrimal glands: Secondary | ICD-10-CM | POA: Diagnosis not present

## 2023-10-15 DIAGNOSIS — H43813 Vitreous degeneration, bilateral: Secondary | ICD-10-CM | POA: Diagnosis not present

## 2023-10-15 DIAGNOSIS — H353213 Exudative age-related macular degeneration, right eye, with inactive scar: Secondary | ICD-10-CM | POA: Diagnosis not present

## 2023-10-15 DIAGNOSIS — H353221 Exudative age-related macular degeneration, left eye, with active choroidal neovascularization: Secondary | ICD-10-CM | POA: Diagnosis not present

## 2023-10-22 DIAGNOSIS — M47816 Spondylosis without myelopathy or radiculopathy, lumbar region: Secondary | ICD-10-CM | POA: Diagnosis not present

## 2023-11-12 DIAGNOSIS — M419 Scoliosis, unspecified: Secondary | ICD-10-CM | POA: Diagnosis not present

## 2023-11-12 DIAGNOSIS — M47816 Spondylosis without myelopathy or radiculopathy, lumbar region: Secondary | ICD-10-CM | POA: Diagnosis not present

## 2023-12-06 ENCOUNTER — Other Ambulatory Visit: Payer: Self-pay | Admitting: Family Medicine

## 2023-12-06 DIAGNOSIS — E7800 Pure hypercholesterolemia, unspecified: Secondary | ICD-10-CM

## 2023-12-06 MED ORDER — ROSUVASTATIN CALCIUM 20 MG PO TABS: 20.0000 mg | ORAL_TABLET | Freq: Every evening | ORAL | 0 refills | Status: DC

## 2023-12-06 NOTE — Telephone Encounter (Signed)
 Copied from CRM #[id number]. Topic: Clinical - Medication Refill >> Dec 06, 2023  9:13 AM Delon HERO wrote: Medication: rosuvastatin  (CRESTOR ) 20 MG tablet [[id number]]  Has the patient contacted their pharmacy? Yes (Agent: If no, request that the patient contact the pharmacy for the refill. If patient does not wish to contact the pharmacy document the reason why and proceed with request.) (Agent: If yes, when and what did the pharmacy advise?)  This is the patient's preferred pharmacy:   [facility] - [place], [place] - [address] [place] [address] Phone: [phone] Fax: [phone]  Is this the correct pharmacy for this prescription? Yes If no, delete pharmacy and type the correct one.   Has the prescription been filled recently? Yes  Is the patient out of the medication? Yes  Has the patient been seen for an appointment in the last year OR does the patient have an upcoming appointment? Yes  Can we respond through MyChart? Yes  Agent: Please be advised that Rx refills may take up to 3 business days. We ask that you follow-up with your pharmacy.

## 2023-12-07 NOTE — Telephone Encounter (Signed)
 Duplicate request, refilled 12/06/23.  Requested Prescriptions  Pending Prescriptions Disp Refills   rosuvastatin  (CRESTOR ) 20 MG tablet 90 tablet 0    Sig: Take 1 tablet (20 mg total) by mouth every evening.     Cardiovascular:  Antilipid - Statins 2 Failed - 12/07/2023  9:49 AM      Failed - Cr in normal range and within 360 days    Creatinine, Ser  Date Value Ref Range Status  04/05/2023 1.21 (H) 0.57 - 1.00 mg/dL Final         Failed - Valid encounter within last 12 months    Recent Outpatient Visits   None     Future Appointments             In 1 week Raymund Lauraine BROCKS, MD [facility]            Failed - Lipid Panel in normal range within the last 12 months    Cholesterol, Total  Date Value Ref Range Status  12/27/2022 118 100 - 199 mg/dL Final   LDL Chol Calc (NIH)  Date Value Ref Range Status  12/27/2022 56 0 - 99 mg/dL Final   HDL  Date Value Ref Range Status  12/27/2022 46 >39 mg/dL Final   Triglycerides  Date Value Ref Range Status  12/27/2022 79 0 - 149 mg/dL Final         Passed - Patient is not pregnant

## 2023-12-18 ENCOUNTER — Ambulatory Visit

## 2023-12-18 DIAGNOSIS — C44321 Squamous cell carcinoma of skin of nose: Secondary | ICD-10-CM

## 2023-12-18 DIAGNOSIS — C4492 Squamous cell carcinoma of skin, unspecified: Secondary | ICD-10-CM

## 2023-12-18 DIAGNOSIS — D492 Neoplasm of unspecified behavior of bone, soft tissue, and skin: Secondary | ICD-10-CM | POA: Diagnosis not present

## 2023-12-18 DIAGNOSIS — L814 Other melanin hyperpigmentation: Secondary | ICD-10-CM | POA: Diagnosis not present

## 2023-12-18 DIAGNOSIS — W908XXA Exposure to other nonionizing radiation, initial encounter: Secondary | ICD-10-CM

## 2023-12-18 DIAGNOSIS — L821 Other seborrheic keratosis: Secondary | ICD-10-CM

## 2023-12-18 DIAGNOSIS — L578 Other skin changes due to chronic exposure to nonionizing radiation: Secondary | ICD-10-CM

## 2023-12-18 DIAGNOSIS — Z1283 Encounter for screening for malignant neoplasm of skin: Secondary | ICD-10-CM | POA: Diagnosis not present

## 2023-12-18 DIAGNOSIS — Z872 Personal history of diseases of the skin and subcutaneous tissue: Secondary | ICD-10-CM

## 2023-12-18 HISTORY — DX: Squamous cell carcinoma of skin, unspecified: C44.92

## 2023-12-18 NOTE — Progress Notes (Signed)
 Subjective   Christina [name] is a 88 y.o. female who presents for the following: Lesion(s) of concern . Patient is new patient.  Today patient reports: LOC at nose present since May 2025. Patient states she was seen by a dermatologist in [facility]) and had a biopsy done, biopsy showed that it was a precancer. Was not treated. Patient states it bleeds once in a while and cannot get it to heal, feels rough to the touch. No personal hx of skin cancer.  Review of Systems:    No other skin or systemic complaints except as noted in HPI or Assessment and Plan.  The following portions of the chart were reviewed this encounter and updated as appropriate: medications, allergies, medical history  Relevant Medical History:  Personal history of actinic keratosis   Objective  (SKPE) Well appearing patient in no apparent distress; mood and affect are within normal limits. Examination was performed of the: Sun Exposed Exam: Scalp, head, eyes, ears, nose, lips, neck, upper extremities, hands, fingers, fingernails  Examination notable for:  - Lentigo/lentigines: Scattered pigmented macules that are tan to brown in color and are somewhat non-uniform in shape and concentrated in the sun-exposed areas of the trunk and upper extremities - Seborrheic Keratosis(es): Stuck-on appearing keratotic papule(s) on the trunk, none  irritated with redness, crusting, edema, and/or partial avulsion - Actinic Damage/Elastosis: chronic sun damage: dyspigmentation, telangiectasia, and wrinkling  2.5 x 2 cm pink scaly plaque at nasal tip.  Examination limited by: Undergarments, Shoes or socks , Clothing, and Patient deferred removal     Nasal tip 2.5 x 2 cm pink scaly plaque    Assessment & Plan  (SKAP)    SKIN CANCER SCREENING PERFORMED TODAY.  BENIGN SKIN FINDINGS  - Lentigines  - Seborrheic keratoses - Reassurance provided regarding the benign appearance of lesions noted on exam today;  no treatment is indicated in the absence of symptoms/changes. - Reinforced importance of photoprotective strategies including liberal and frequent sunscreen use of a broad-spectrum SPF 30 or greater, use of protective clothing, and sun avoidance for prevention of cutaneous malignancy and photoaging.  Counseled patient on the importance of regular self-skin monitoring as well as routine clinical skin examinations as scheduled.   ACTINIC DAMAGE - Chronic condition, secondary to cumulative UV/sun exposure - Recommend daily broad spectrum sunscreen SPF 30+ to sun-exposed areas, reapply every 2 hours as needed.  - Staying in the shade or wearing long sleeves, sun glasses (UVA+UVB protection) and wide brim hats (4-inch brim around the entire circumference of the hat) are also recommended for sun protection.  - Call for new or changing lesions. - Discussed FBSE q37mo iso likely nmsc as per below   Personal history of actinic keratosis  - Reviewed medical history for full details  - Reviewed sun protective measures as above - Encouraged full body skin exams   Level of service outlined above   Patient instructions (SKPI)   Procedures, orders, diagnosis for this visit:  NEOPLASM OF SKIN Nasal tip Skin / nail biopsy Type of biopsy: tangential   Informed consent: discussed and consent obtained   Timeout: patient name, date of birth, surgical site, and procedure verified   Procedure prep:  Patient was prepped and draped in usual sterile fashion Prep type:  Isopropyl alcohol  Anesthesia: the lesion was anesthetized in a standard fashion   Anesthetic:  1% lidocaine  w/ epinephrine  1-100,000 buffered w/ 8.4% NaHCO3 Instrument used: DermaBlade   Hemostasis achieved with: pressure and aluminum  chloride   Outcome: patient tolerated procedure well   Post-procedure details: sterile dressing applied and wound care instructions given   Dressing type: bandage and petrolatum    Specimen 1 - Surgical  pathology Differential Diagnosis: SCC vs BCC vs other Prior biopsy showing AK  Check Margins: No 2.5 x 2 cm pink scaly plaque Advised patient if biopsy proves skin cancer, would refer for Mohs surgery to Dr. Corey.  Neoplasm of skin -     Skin / nail biopsy -     Surgical pathology; Standing    Return to clinic: Return in about 6 months (around 06/16/2024) for TBSE, w/ Dr. Raymund.  I, Jacquelynn V. Wilfred, CMA, am acting as scribe for Lauraine JAYSON Raymund, MD.  Documentation: I have reviewed the above documentation for accuracy and completeness, and I agree with the above.  Lauraine JAYSON Raymund, MD

## 2023-12-18 NOTE — Patient Instructions (Addendum)
 [facility] (Dr. Corey) [address] [place], [place] [address] [phone]    The bandage should remain in place until tomorrow. Remove the bandages and clean the wound once daily as follows:  - Wash your hands. Clean the wound gently with soap and water , and then pat dry. Do not rub. Apply a small amount of Petroleum Jelly or Vaseline. Cover the area with a Band-Aid.  A small amount of bleeding is normal. If bleeding persists, apply firm pressure over the bandage for 5 to 10 minutes without interruption. If bleeding continues, call our office. Continue to clean the area as directed above until the wound is healed. Shave biopsies may take several weeks to heal. It is normal if the edges are pink/red and the center is slight yellowish or white in color. However if the site becomes hot, swollen, has a thick drainage or redness that expands away from the site please call us . We will contact you with results once available.    Due to recent changes in healthcare laws, you may see results of your pathology and/or laboratory studies on MyChart before the doctors have had a chance to review them. We understand that in some cases there may be results that are confusing or concerning to you. Please understand that not all results are received at the same time and often the doctors may need to interpret multiple results in order to provide you with the best plan of care or course of treatment. Therefore, we ask that you please give us  2 business days to thoroughly review all your results before contacting the office for clarification. Should we see a critical lab result, you will be contacted sooner.   If You Need Anything After Your Visit  If you have any questions or concerns for your doctor, please call our main line at [phone] and press option 4 to reach your doctor's medical assistant. If no one answers, please leave a voicemail as directed and we will return your call as  soon as possible. Messages left after 4 pm will be answered the following business day.   You may also send us  a message via MyChart. We typically respond to MyChart messages within 1-2 business days.  For prescription refills, please ask your pharmacy to contact our office. Our fax number is [phone].  If you have an urgent issue when the clinic is closed that cannot wait until the next business day, you can page your doctor at the number below.    Please note that while we do our best to be available for urgent issues outside of office hours, we are not available 24/7.   If you have an urgent issue and are unable to reach us , you may choose to seek medical care at your doctor's office, retail clinic, urgent care center, or emergency room.  If you have a medical emergency, please immediately call 911 or go to the emergency department.  Pager Numbers  - Dr. Hester: [phone]  - Dr. Jackquline: [phone]  - Dr. Claudene: [phone]   In the event of inclement weather, please call our main line at [phone] for an update on the status of any delays or closures.  Dermatology Medication Tips: Please keep the boxes that topical medications come in in order to help keep track of the instructions about where and how to use these. Pharmacies typically print the medication instructions only on the boxes and not directly on the medication tubes.   If your medication  is too expensive, please contact our office at [phone] option 4 or send us  a message through MyChart.   We are unable to tell what your co-pay for medications will be in advance as this is different depending on your insurance coverage. However, we may be able to find a substitute medication at lower cost or fill out paperwork to get insurance to cover a needed medication.   If a prior authorization is required to get your medication covered by your insurance company, please allow us  1-2 business days to complete this  process.  Drug prices often vary depending on where the prescription is filled and some pharmacies may offer cheaper prices.  The website www.goodrx.com contains coupons for medications through different pharmacies. The prices here do not account for what the cost may be with help from insurance (it may be cheaper with your insurance), but the website can give you the price if you did not use any insurance.  - You can print the associated coupon and take it with your prescription to the pharmacy.  - You may also stop by our office during regular business hours and pick up a GoodRx coupon card.  - If you need your prescription sent electronically to a different pharmacy, notify our office through [facility] or by phone at [phone] option 4.     Si Usted Necesita Algo Despus de Su Visita  Tambin puede enviarnos un mensaje a travs de Clinical Cytogeneticist. Por lo general respondemos a los mensajes de MyChart en el transcurso de 1 a 2 das hbiles.  Para renovar recetas, por favor pida a su farmacia que se ponga en contacto con nuestra oficina. Randi lakes de fax es [place] [phone].  Si tiene un asunto urgente cuando la clnica est cerrada y que no puede esperar hasta el siguiente da hbil, puede llamar/localizar a su doctor(a) al nmero que aparece a continuacin.   Por favor, tenga en cuenta que aunque hacemos todo lo posible para estar disponibles para asuntos urgentes fuera del horario de [place], no estamos disponibles las 24 horas del da, los 7 [address]  [phone] de la [place].   Si tiene un problema urgente y no puede comunicarse con nosotros, puede optar por buscar atencin mdica  en el consultorio de su doctor(a), en una clnica privada, en un centro de atencin urgente o en una sala de emergencias.  Si tiene engineer, drilling, por favor llame inmediatamente al 911 o vaya a la sala de emergencias.  Nmeros de bper  - Dr. Hester: [phone]  - Dra. Jackquline: [phone]  - Dr.  Claudene: [phone]   En caso de inclemencias del tiempo, por favor llame a landry capes principal al [phone] para una actualizacin sobre el [place] de cualquier retraso o cierre.  Consejos para la medicacin en dermatologa: Por favor, guarde las cajas en las que vienen los medicamentos de uso tpico para ayudarle a seguir las instrucciones sobre dnde y cmo usarlos. Las farmacias generalmente imprimen las instrucciones del medicamento slo en las cajas y no directamente en los tubos del [place].   Si su medicamento es muy caro, por favor, pngase en contacto con landry rieger llamando al [phone] y presione la opcin 4 o envenos un mensaje a travs de Clinical Cytogeneticist.   No podemos decirle cul ser su copago por los medicamentos por adelantado ya que esto es diferente dependiendo de la cobertura de su seguro. Sin embargo, es posible que podamos encontrar un medicamento sustituto a audiological scientist un formulario para  que el seguro cubra el medicamento que se considera necesario.   Si se requiere una autorizacin previa para que su compaa de seguros cubra su medicamento, por favor permtanos de 1 a 2 das hbiles para completar este proceso.  Los precios de los medicamentos varan con frecuencia dependiendo del environmental consultant de dnde se surte la receta y alguna farmacias pueden ofrecer precios ms baratos.  El sitio web www.goodrx.com tiene cupones para medicamentos de health and safety inspector. Los precios aqu no tienen en cuenta lo que podra costar con la ayuda del seguro (puede ser ms barato con su seguro), pero el sitio web puede darle el precio si no utiliz tourist information centre manager.  - Puede imprimir el cupn correspondiente y llevarlo con su receta a la farmacia.  - Tambin puede pasar por nuestra oficina durante el horario de atencin regular y education officer, museum una tarjeta de cupones de GoodRx.  - Si necesita que su receta se enve electrnicamente a una farmacia diferente, informe a nuestra oficina a  travs de MyChart de [place] o por telfono llamando al [phone] y presione la opcin 4.

## 2023-12-20 LAB — SURGICAL PATHOLOGY

## 2023-12-23 ENCOUNTER — Ambulatory Visit: Payer: Self-pay

## 2023-12-23 DIAGNOSIS — C44311 Basal cell carcinoma of skin of nose: Secondary | ICD-10-CM

## 2023-12-24 NOTE — Telephone Encounter (Signed)
-----   Message from Lauraine JAYSON Kanaris sent at 12/23/2023  8:55 AM EST -----    1. Skin, nasal tip :       WELL DIFFERENTIATED SQUAMOUS CELL CARCINOMA, BASE INVOLVED   Please notify patient with below plan: Mohs   ----- Message ----- From: Interface, Lab In [place] Sent: 12/20/2023   4:20 PM EST To: Lauraine JAYSON Kanaris, MD

## 2023-12-24 NOTE — Telephone Encounter (Signed)
 Patient informed of pathology results and referral sent to Dr. Shawn Delay office.

## 2023-12-31 ENCOUNTER — Encounter: Admitting: Family Medicine

## 2024-01-07 ENCOUNTER — Encounter: Admitting: Family Medicine

## 2024-02-04 ENCOUNTER — Ambulatory Visit (INDEPENDENT_AMBULATORY_CARE_PROVIDER_SITE_OTHER)

## 2024-02-04 VITALS — BP 143/66 | HR 71 | Temp 97.8°F | Wt 156.2 lb

## 2024-02-04 DIAGNOSIS — I5032 Chronic diastolic (congestive) heart failure: Secondary | ICD-10-CM | POA: Diagnosis not present

## 2024-02-04 DIAGNOSIS — R7303 Prediabetes: Secondary | ICD-10-CM | POA: Diagnosis not present

## 2024-02-04 DIAGNOSIS — J441 Chronic obstructive pulmonary disease with (acute) exacerbation: Secondary | ICD-10-CM | POA: Diagnosis not present

## 2024-02-04 DIAGNOSIS — E7800 Pure hypercholesterolemia, unspecified: Secondary | ICD-10-CM

## 2024-02-04 DIAGNOSIS — M0609 Rheumatoid arthritis without rheumatoid factor, multiple sites: Secondary | ICD-10-CM

## 2024-02-04 DIAGNOSIS — I10 Essential (primary) hypertension: Secondary | ICD-10-CM | POA: Diagnosis not present

## 2024-02-04 DIAGNOSIS — I2510 Atherosclerotic heart disease of native coronary artery without angina pectoris: Secondary | ICD-10-CM

## 2024-02-04 MED ORDER — BENZONATATE 200 MG PO CAPS: 200.0000 mg | ORAL_CAPSULE | Freq: Three times a day (TID) | ORAL | 1 refills | Status: AC | PRN

## 2024-02-04 MED ORDER — PREDNISONE 20 MG PO TABS: 40.0000 mg | ORAL_TABLET | Freq: Every day | ORAL | 0 refills | Status: AC

## 2024-02-04 MED ORDER — FLUTICASONE PROPIONATE 50 MCG/ACT NA SUSP: 1.0000 | Freq: Every day | NASAL | 12 refills | Status: AC

## 2024-02-04 MED ORDER — AZITHROMYCIN 500 MG PO TABS: 500.0000 mg | ORAL_TABLET | Freq: Every day | ORAL | 0 refills | Status: AC

## 2024-02-04 NOTE — Assessment & Plan Note (Signed)
 Chronic, uncontrolled. Has not followed up with rheumatology. Recommend patient follow up with rheumatology for further evaluation and treatment.

## 2024-02-04 NOTE — Progress Notes (Signed)
 "   New patient visit   Patient: Christina [name]   DOB: [birth year]   88 y.o. Female  MRN: [id number] Visit Date: 02/04/2024  Today's healthcare provider: Isaiah DELENA Pepper, MD   Chief Complaint  Patient presents with   URI    Started before christmas Has COPD  Cough is taking medication for the cough working some what  No nausea or vomiting  Chest congestion   Sore Throat    Hard to swallow sometimes   Subjective    Christina [name] is a 88 y.o. female who presents today as a new patient to establish care.   Discussed the use of AI scribe software for clinical note transcription with the patient, who gave verbal consent to proceed.  History of Present Illness Christina [name] is an 88 year old female with COPD who presents with increased coughing and phlegm production.  Her symptoms began approximately two weeks ago, characterized by increased coughing and phlegm production, particularly bothersome at night, waking her up around 3 AM. This has led to interrupted sleep, although she notes she does not sleep well in general.  She has a history of COPD and uses a nebulizer twice daily, increasing the frequency if needed. She has not used inhalers recently due to insurance issues with Symbicort , which she previously used along with another inhaler she cannot recall. Her insurance does not cover Symbicort  anymore, and Trelegy was too expensive.  She has been using Flonase for nasal symptoms and Tessalon  Perles for cough, both of which she finds helpful. She has not taken prednisone  recently, though it has been prescribed in the past. She also mentions using a cough medicine that provides some relief.  She is currently taking torsemide  every other day, depending on her schedule, and spironolactone  daily for fluid management. She also takes Eliquis  and metoprolol  twice daily for her heart condition. She reports a history of heart failure and is under the care of a  cardiologist. She has not seen them recently due to changes in her healthcare providers and her current illness.  She wears compression socks for leg swelling and reports having had hernia surgery in the past.   Past Medical History:  Diagnosis Date   Adenomatous colon polyp    Anemia    Aortic atherosclerosis    Arthritis    Barrett's esophagus    Bilateral carotid artery disease    a.) carotid doppler 07/18/2020: 1-39% RICA, 60-79% LICA; b.) carotid doppler 08/15/2020: 70-99% LICA, no sig RICA; c.) carotid doppler 01/23/2021: 1-39% RICA, 40-59% LICA; d.) carotid doppler 08/03/2021: 40-59% LICA, near norm RICA   CKD (chronic kidney disease), stage III (HCC)    COPD (chronic obstructive pulmonary disease) (HCC)    Coronary artery disease 04/21/2004   a.) LHC 04/21/2004 [facility]): EF 66%, 50% dLCx, 75% pLCx, 75% oRCA, 50% pRCA --> transferred to [facility] for CVTS consult; b.) 3v CABG   Dyspnea on exertion    GERD (gastroesophageal reflux disease)    Hiatal hernia    Hyperlipidemia    Hypertension    Long term current use of anticoagulant    a.) reduced dose apixaban    Macular degeneration    OSA on CPAP    PAD (peripheral artery disease) 12/10/2016   a.) PTA of BILATERAL common iliac arteries --> 7 x 26 mm stents placed   PAF (paroxysmal atrial fibrillation) (HCC)    a.) CHA2DS2-VASc = 6 (age x 2, sex, HTN, vascular disease, T2DM);  b.) rate/rhythm maintained on oral metoprolol  tartrate; chronically anticoagulated using dose reduced apixaban    Peripheral edema    PSVT (paroxysmal supraventricular tachycardia)    S/P CABG x 3 04/2004   Schatzki's ring    Scoliosis of thoracolumbar spine    Squamous cell carcinoma of skin 12/18/2023   nasal tip - referred for Mohs with Dr. Corey   Valvular heart disease    a.) TTE 04/20/2004: EF >55%, mild MR; b.)  TTE 04/10/2021: EF 50-55%, LVH, reduced RV SF, RVE, moderate BAE, mild-mod MR, severe TR.   Past Surgical History:  Procedure  Laterality Date   ABDOMINAL HYSTERECTOMY  1980   due to dysfunctional uterine bleeding   APPENDECTOMY  1980   BREAST SURGERY Left 2000   biopsy   CARDIAC CATHETERIZATION     COLONOSCOPY     CORONARY ARTERY BYPASS GRAFT  2006   ESOPHAGOGASTRODUODENOSCOPY (EGD) WITH PROPOFOL  N/A 06/08/2021   Procedure: ESOPHAGOGASTRODUODENOSCOPY (EGD) WITH PROPOFOL ;  Surgeon: Therisa Bi, MD;  Location: [facility] ENDOSCOPY;  Service: Gastroenterology;  Laterality: N/A;   HEMORRHOID SURGERY     INSERTION OF MESH  09/05/2021   Procedure: INSERTION OF MESH;  Surgeon: Jordis Laneta FALCON, MD;  Location: [facility] ORS;  Service: General;;   LOWER EXTREMITY ANGIOGRAPHY Right 02/25/2017   Procedure: LOWER EXTREMITY ANGIOGRAPHY;  Surgeon: Marea Selinda RAMAN, MD;  Location: [facility] INVASIVE CV LAB;  Service: Cardiovascular;  Laterality: Right;   LOWER EXTREMITY INTERVENTION  02/25/2017   Procedure: LOWER EXTREMITY INTERVENTION;  Surgeon: Marea Selinda RAMAN, MD;  Location: [facility] INVASIVE CV LAB;  Service: Cardiovascular;;   LUNG BIOPSY  1999   Negative   TONSILLECTOMY     XI ROBOTIC ASSISTED PARAESOPHAGEAL HERNIA REPAIR N/A 09/05/2021   Procedure: XI ROBOTIC ASSISTED PARAESOPHAGEAL HERNIA REPAIR, RNFA to assist;  Surgeon: Jordis Laneta FALCON, MD;  Location: [facility] ORS;  Service: General;  Laterality: N/A;   Family Status  Relation Name Status   Mother  Deceased   Father  Deceased at age 23       MI   Sister  Alive   PGF  (Not Specified)   Son  Alive   Son  Alive  No partnership data on file   Family History  Problem Relation Age of Onset   Hypertension Mother    Hyperlipidemia Mother    Alzheimer's disease Mother    CAD Mother    Heart attack Father    Lung disease Sister    Heart disease Sister    Diabetes Paternal Grandfather        Type 2   Hearing loss Son    Social History   Socioeconomic History   Marital status: Married    Spouse name: Cliffton ANISE HARBIN   Number of children: 2   Years of education: 12   Highest education  level: 12th grade  Occupational History   Occupation: retired  Tobacco Use   Smoking status: Former    Current packs/day: 0.00    Average packs/day: 0.5 packs/day for 5.0 years (2.5 ttl pk-yrs)    Types: Cigarettes    Start date: 04/25/1999    Quit date: 04/24/2004    Years since quitting: 19.7    Passive exposure: Past   Smokeless tobacco: Never  Vaping Use   Vaping status: Never Used  Substance and Sexual Activity   Alcohol  use: No   Drug use: No   Sexual activity: Yes  Other Topics Concern   Not on file  Social History Narrative  Not on file   Social Drivers of Health   Tobacco Use: Medium Risk (02/04/2024)   Patient History    Smoking Tobacco Use: Former    Smokeless Tobacco Use: Never    Passive Exposure: Past  Physicist, Medical Strain: Low Risk (12/27/2022)   Overall Financial Resource Strain (CARDIA)    Difficulty of Paying Living Expenses: Not hard at all  Food Insecurity: No Food Insecurity (12/27/2022)   Hunger Vital Sign    Worried About Running Out of Food in the Last Year: Never true    Ran Out of Food in the Last Year: Never true  Transportation Needs: No Transportation Needs (12/27/2022)   PRAPARE - Administrator, Civil Service (Medical): No    Lack of Transportation (Non-Medical): No  Physical Activity: Inactive (12/27/2022)   Exercise Vital Sign    Days of Exercise per Week: 0 days    Minutes of Exercise per Session: 0 min  Stress: Stress Concern Present (12/27/2022)   [employer] of Occupational Health - Occupational Stress Questionnaire    Feeling of Stress : Very much  Social Connections: Moderately Integrated (12/27/2022)   Social Connection and Isolation Panel    Frequency of Communication with Friends and Family: Three times a week    Frequency of Social Gatherings with Friends and Family: More than three times a week    Attends Religious Services: More than 4 times per year    Active Member of Clubs or Organizations: No     Attends Banker Meetings: Never    Marital Status: Married  Depression (PHQ2-9): High Risk (02/04/2024)   Depression (PHQ2-9)    PHQ-2 Score: 12  Alcohol  Screen: Low Risk (01/15/2022)   Alcohol  Screen    Last Alcohol  Screening Score (AUDIT): 0  Housing: Unknown (10/10/2023)   Received from [facility]   Epic    Unable to Pay for Housing in the Last Year: Not on file    Number of Times Moved in the Last Year: Not on file    At any time in the past 12 months, were you homeless or living in a shelter (including now)?: No  Utilities: Not on file  Health Literacy: Not on file   Show/hide medication list[1] Allergies[2]  Reviews of Systems as noted in HPI.      Objective    BP (!) 143/66 Comment: 2nd provider notified  Pulse 71   Temp 97.8 F (36.6 C) (Oral)   Wt 156 lb 3.2 oz (70.9 kg)   SpO2 97%   BMI 25.21 kg/m     Physical Exam Constitutional:      Appearance: Normal appearance.  HENT:     Head: Normocephalic and atraumatic.     Mouth/Throat:     Mouth: Mucous membranes are moist.     Pharynx: Posterior oropharyngeal erythema present. No oropharyngeal exudate.  Eyes:     Pupils: Pupils are equal, round, and reactive to light.  Cardiovascular:     Rate and Rhythm: Normal rate and regular rhythm.  Pulmonary:     Effort: Pulmonary effort is normal. No respiratory distress.     Breath sounds: Wheezing present. No rhonchi or rales.  Skin:    General: Skin is warm.  Neurological:     General: No focal deficit present.     Mental Status: She is alert.      Depression Screen    02/04/2024    9:50 AM 12/27/2022    1:53 PM  12/15/2021   11:08 AM 07/11/2021    2:59 PM  PHQ 2/9 Scores  PHQ - 2 Score 5 3 4  0  PHQ- 9 Score 12 10  8  5       Data saved with a previous flowsheet row definition   No results found for any visits on 02/04/24.  Assessment & Plan      Problem List Items Addressed This Visit       Cardiovascular  and Mediastinum   Hypertension   Relevant Orders   Comprehensive metabolic panel with GFR   Coronary artery disease   Chronic heart failure with preserved ejection fraction (HCC)     Respiratory   COPD exacerbation (HCC) - Primary   Relevant Medications   benzonatate  (TESSALON ) 200 MG capsule   fluticasone (FLONASE) 50 MCG/ACT nasal spray   predniSONE  (DELTASONE ) 20 MG tablet   azithromycin  (ZITHROMAX ) 500 MG tablet   Other Relevant Orders   CBC with Differential/Platelet     Musculoskeletal and Integument   Rheumatoid arthritis of multiple sites with negative rheumatoid factor (HCC)   Chronic, uncontrolled. Has not followed up with rheumatology. Recommend patient follow up with rheumatology for further evaluation and treatment.      Relevant Medications   predniSONE  (DELTASONE ) 20 MG tablet     Other   Hyperlipidemia   Relevant Orders   Lipid panel   Prediabetes   Relevant Orders   Hemoglobin A1c   Assessment & Plan COPD exacerbation Patient with 2 week hx of increased productive cough and wheezing concerning for COPDe. Patient using duonebs twice daily. Previously had insurance issues with Symbicort  and Trelegy. Follows with pulmonology. - Prescribed prednisone  x 5 days - Prescribed azithromycin  x 3 days - Advised nebulizer use up to four times daily if needed. - Recommended follow-up with pulmonologist, would benefit from daily inhaler given frequent exacerbations  HFpEF Chronic, controlled. Euvolemic on exam. Managed with losartan , metoprolol  and spironolactone . Patient taking torsemide  every other day. - Encouraged follow-up with cardiologist. - Continue medications as prescribed  Primary hypertension Chronic, uncontrolled likely because patient did not yet take her medications this morning.  - Continue metoprolol , losartan , spironolactone  - Check CMP  Pure hypercholesterolemia Chronic, controlled. Taking Crestor  20mg  daily. - Recheck lipid panel  today  Prediabetes Last A1c of 6.3.  - Will recheck A1c  CAD S/p CABG x3 in 2006, no recent anginal symptoms. - Continue apixaban , crestor  20mg  daily   Return in about 3 months (around 05/04/2024) for Annual Physical Exam.      Isaiah DELENA Pepper, MD  [facility] [phone] (phone) [phone] (fax)     [1]  Outpatient Medications Prior to Visit  Medication Sig   acetaminophen  (TYLENOL ) 500 MG tablet Take 500 mg by mouth every 8 (eight) hours as needed for mild pain or moderate pain.   apixaban  (ELIQUIS ) 2.5 MG TABS tablet Take 1 tablet (2.5 mg total) by mouth 2 (two) times daily.   Ascorbic Acid (VITAMIN C) 1000 MG tablet Take 1,000 mg by mouth 2 (two) times daily.   azelastine  (ASTELIN ) 0.1 % nasal spray Place 1 spray into both nostrils daily as needed.   carboxymethylcellulose (REFRESH PLUS) 0.5 % SOLN 1 drop 3 (three) times daily as needed.   hydroxychloroquine (PLAQUENIL) 200 MG tablet Take 200 mg by mouth daily.   ipratropium-albuterol  (DUONEB) 0.5-2.5 (3) MG/3ML SOLN Take 3 mLs by nebulization 2 (two) times daily.   losartan  (COZAAR ) 50 MG tablet Take 1 tablet (50 mg  total) by mouth daily.   metoprolol  tartrate (LOPRESSOR ) 50 MG tablet Take 1 tablet (50 mg total) by mouth 2 (two) times daily.   prednisoLONE acetate (PRED FORTE) 1 % ophthalmic suspension Place 1 drop into both eyes every 2 (two) hours while awake.   rosuvastatin  (CRESTOR ) 20 MG tablet Take 1 tablet (20 mg total) by mouth every evening.   spironolactone  (ALDACTONE ) 25 MG tablet Take 1 tablet (25 mg total) by mouth daily.   tobramycin  (TOBREX ) 0.3 % ophthalmic solution    torsemide  (DEMADEX ) 20 MG tablet Take 20 mg by mouth daily.   torsemide  (DEMADEX ) 20 MG tablet Take 1 tablet (20 mg total) by mouth daily.   [DISCONTINUED] promethazine-phenylephrine  (PROMETHAZINE VC) 6.25-5 MG/5ML SYRP Take 5 mLs by mouth every 4 (four) hours as needed for congestion.   [DISCONTINUED]  torsemide  (DEMADEX ) 20 MG tablet Take 20 mg by mouth daily.   [DISCONTINUED] benzonatate  (TESSALON ) 100 MG capsule Take 1 capsule by mouth 3 (three) times daily as needed. (Patient not taking: Reported on 02/04/2024)   No facility-administered medications prior to visit.  [2]  Allergies Allergen Reactions   Baclofen Other (See Comments)   Codeine Nausea Only   Plavix  [Clopidogrel ]     brusing all over   Sulfa Antibiotics Nausea Only   Latex Rash   "

## 2024-02-04 NOTE — Patient Instructions (Addendum)
 Please schedule appointments with your cardiologist and pulmonologist.

## 2024-02-05 ENCOUNTER — Ambulatory Visit: Payer: Self-pay

## 2024-02-05 ENCOUNTER — Other Ambulatory Visit: Payer: Self-pay

## 2024-02-05 DIAGNOSIS — I10 Essential (primary) hypertension: Secondary | ICD-10-CM

## 2024-02-05 DIAGNOSIS — N1832 Chronic kidney disease, stage 3b: Secondary | ICD-10-CM

## 2024-02-05 LAB — COMPREHENSIVE METABOLIC PANEL WITH GFR
ALT: 11 IU/L (ref 0–32)
AST: 21 IU/L (ref 0–40)
Albumin: 4.1 g/dL (ref 3.7–4.7)
Alkaline Phosphatase: 111 IU/L (ref 48–129)
BUN/Creatinine Ratio: 18 (ref 12–28)
BUN: 27 mg/dL (ref 8–27)
Bilirubin Total: 0.6 mg/dL (ref 0.0–1.2)
CO2: 24 mmol/L (ref 20–29)
Calcium: 9.7 mg/dL (ref 8.7–10.3)
Chloride: 101 mmol/L (ref 96–106)
Creatinine, Ser: 1.52 mg/dL — ABNORMAL HIGH (ref 0.57–1.00)
Globulin, Total: 2.2 g/dL (ref 1.5–4.5)
Glucose: 95 mg/dL (ref 70–99)
Potassium: 4.8 mmol/L (ref 3.5–5.2)
Sodium: 141 mmol/L (ref 134–144)
Total Protein: 6.3 g/dL (ref 6.0–8.5)
eGFR: 33 mL/min/1.73 — ABNORMAL LOW

## 2024-02-05 LAB — HEMOGLOBIN A1C
Est. average glucose Bld gHb Est-mCnc: 126 mg/dL
Hgb A1c MFr Bld: 6 % — ABNORMAL HIGH (ref 4.8–5.6)

## 2024-02-05 LAB — CBC WITH DIFFERENTIAL/PLATELET
Basophils Absolute: 0 x10E3/uL (ref 0.0–0.2)
Basos: 1 %
EOS (ABSOLUTE): 0.3 x10E3/uL (ref 0.0–0.4)
Eos: 5 %
Hematocrit: 40.5 % (ref 34.0–46.6)
Hemoglobin: 13.3 g/dL (ref 11.1–15.9)
Immature Grans (Abs): 0 x10E3/uL (ref 0.0–0.1)
Immature Granulocytes: 0 %
Lymphocytes Absolute: 1 x10E3/uL (ref 0.7–3.1)
Lymphs: 17 %
MCH: 35.1 pg — ABNORMAL HIGH (ref 26.6–33.0)
MCHC: 32.8 g/dL (ref 31.5–35.7)
MCV: 107 fL — ABNORMAL HIGH (ref 79–97)
Monocytes Absolute: 0.6 x10E3/uL (ref 0.1–0.9)
Monocytes: 10 %
Neutrophils Absolute: 4 x10E3/uL (ref 1.4–7.0)
Neutrophils: 67 %
Platelets: 161 x10E3/uL (ref 150–450)
RBC: 3.79 x10E6/uL (ref 3.77–5.28)
RDW: 11.2 % — ABNORMAL LOW (ref 11.7–15.4)
WBC: 6 x10E3/uL (ref 3.4–10.8)

## 2024-02-05 LAB — LIPID PANEL
Chol/HDL Ratio: 2.3 ratio (ref 0.0–4.4)
Cholesterol, Total: 114 mg/dL (ref 100–199)
HDL: 49 mg/dL
LDL Chol Calc (NIH): 45 mg/dL (ref 0–99)
Triglycerides: 112 mg/dL (ref 0–149)
VLDL Cholesterol Cal: 20 mg/dL (ref 5–40)

## 2024-02-05 MED ORDER — METOPROLOL TARTRATE 50 MG PO TABS: 50.0000 mg | ORAL_TABLET | Freq: Two times a day (BID) | ORAL | 3 refills | Status: AC

## 2024-02-05 MED ORDER — LOSARTAN POTASSIUM 50 MG PO TABS: 50.0000 mg | ORAL_TABLET | Freq: Every day | ORAL | 3 refills | Status: AC

## 2024-02-05 MED ORDER — SPIRONOLACTONE 25 MG PO TABS: 25.0000 mg | ORAL_TABLET | Freq: Every day | ORAL | 3 refills | Status: AC

## 2024-02-05 NOTE — Telephone Encounter (Signed)
 Noted

## 2024-02-05 NOTE — Telephone Encounter (Signed)
 FYI Only or Action Required?: FYI only for provider: Lab results provided.  Patient was last seen in primary care on 02/04/2024 by Franchot Isaiah LABOR, MD.  Called Nurse Triage reporting Labs Only.   Triage Disposition: Information or Advice Only Call  Patient/caregiver understands and will follow disposition?: Yes           Copied from CRM #[id number]. Topic: Clinical - Red Word Triage >> Feb 05, 2024  9:04 AM Tiffini S wrote: [facility] that prompted transfer to Nurse Triage: Patient was called from office for lab results- patient states that she is having a hard times understanding the results and asked for a nurse. Reason for Disposition  Health information question, no triage required and triager able to answer question  Answer Assessment - Initial Assessment Questions 1. REASON FOR CALL: What is the main reason for your call? or How can I best help you?   Patient called in wanting her lab results. Dr. Debi message relayed to the patient. She has no further questions at this time and will get labs done ASAP, possibly next week.  Protocols used: Information Only Call - No Triage-A-AH

## 2024-02-10 ENCOUNTER — Telehealth: Payer: Self-pay

## 2024-02-10 NOTE — Telephone Encounter (Signed)
 I recommend patient schedule an appointment to be seen in clinic with me or another provider if she is not feeling better. I also recommend she see pulmonology ASAP.

## 2024-02-10 NOTE — Telephone Encounter (Signed)
 Noted, I still recommend that she be seen by a provider or go to a local urgent care today if her symptoms are worsening.

## 2024-02-10 NOTE — Telephone Encounter (Signed)
 Patient came in the office to get her labs drawn.  Is asking for more medication for her Upper Respiratory infection as she is not much better.

## 2024-02-11 ENCOUNTER — Encounter: Payer: Self-pay | Admitting: Dermatology

## 2024-02-11 ENCOUNTER — Encounter: Admitting: Dermatology

## 2024-02-11 ENCOUNTER — Other Ambulatory Visit: Payer: Self-pay

## 2024-02-11 VITALS — BP 144/81 | HR 79 | Temp 97.9°F

## 2024-02-11 DIAGNOSIS — N1832 Chronic kidney disease, stage 3b: Secondary | ICD-10-CM

## 2024-02-11 DIAGNOSIS — C4492 Squamous cell carcinoma of skin, unspecified: Secondary | ICD-10-CM

## 2024-02-11 DIAGNOSIS — I5032 Chronic diastolic (congestive) heart failure: Secondary | ICD-10-CM

## 2024-02-11 LAB — URINALYSIS, MICROSCOPIC ONLY
Bacteria, UA: NONE SEEN
Casts: NONE SEEN /LPF
RBC, Urine: NONE SEEN /HPF (ref 0–2)

## 2024-02-11 LAB — BASIC METABOLIC PANEL WITH GFR
BUN/Creatinine Ratio: 21 (ref 12–28)
BUN: 32 mg/dL — ABNORMAL HIGH (ref 8–27)
CO2: 22 mmol/L (ref 20–29)
Calcium: 9.4 mg/dL (ref 8.7–10.3)
Chloride: 105 mmol/L (ref 96–106)
Creatinine, Ser: 1.52 mg/dL — ABNORMAL HIGH (ref 0.57–1.00)
Glucose: 86 mg/dL (ref 70–99)
Potassium: 5.1 mmol/L (ref 3.5–5.2)
Sodium: 142 mmol/L (ref 134–144)
eGFR: 33 mL/min/1.73 — ABNORMAL LOW

## 2024-02-11 LAB — MICROALBUMIN / CREATININE URINE RATIO
Creatinine, Urine: 74.4 mg/dL
Microalb/Creat Ratio: 129 mg/g{creat} — ABNORMAL HIGH (ref 0–29)
Microalbumin, Urine: 96.3 ug/mL

## 2024-02-11 MED ORDER — DOXYCYCLINE HYCLATE 100 MG PO TABS: ORAL_TABLET | ORAL | 0 refills | Status: AC

## 2024-02-11 MED ORDER — TRAMADOL HCL 50 MG PO TABS: 50.0000 mg | ORAL_TABLET | Freq: Four times a day (QID) | ORAL | 0 refills | Status: DC | PRN

## 2024-02-11 MED ORDER — GENTAMICIN SULFATE 0.1 % EX OINT: 1.0000 | TOPICAL_OINTMENT | Freq: Three times a day (TID) | CUTANEOUS | 1 refills | Status: AC

## 2024-02-11 MED ORDER — MUPIROCIN 2 % EX OINT: 1.0000 | TOPICAL_OINTMENT | Freq: Two times a day (BID) | CUTANEOUS | 2 refills | Status: DC

## 2024-02-11 MED ORDER — EMPAGLIFLOZIN 10 MG PO TABS: 10.0000 mg | ORAL_TABLET | Freq: Every day | ORAL | 3 refills | Status: AC

## 2024-02-11 NOTE — Progress Notes (Signed)
 "  Follow-Up Visit   Subjective  Christina [name] is a 89 y.o. female who presents for the following: Mohs of a Well Differentiated Squamous Cell Carcinoma of the nasal tip, referred by Dr. Raymund. She is accompanied by her husband.   The following portions of the chart were reviewed this encounter and updated as appropriate: medications, allergies, medical history  Review of Systems:  No other skin or systemic complaints except as noted in HPI or Assessment and Plan.  Objective  Well appearing patient in no apparent distress; mood and affect are within normal limits.  A focused examination was performed of the following areas: Nasal tip Relevant physical exam findings are noted in the Assessment and Plan.   nasal tip Hyperkeratotic plaque   Assessment & Plan   SQUAMOUS CELL CARCINOMA OF SKIN nasal tip - Mohs surgery  Consent obtained: written  Anticoagulation: Is the patient taking prescription anticoagulant and/or aspirin  prescribed/recommended by a physician? Yes   Was the anticoagulation regimen changed prior to Mohs? No    Anesthesia: Anesthesia method: local infiltration Local anesthetic: lidocaine  1% WITH epi and bupivacaine  0.5% WITH epi  Procedure Details: Timeout: pre-procedure verification complete Procedure Prep: patient was prepped and draped in usual sterile fashion Prep type: chlorhexidine  Biopsy accession number: [id number] Pre-Op diagnosis: squamous cell carcinoma SCC subtype: well differentiated MohsAIQ Surgical site (if tumor spans multiple areas, please select predominant area): nose Surgery side: midline Surgical site (from skin exam): nasal tip Pre-operative length (cm): 2 Pre-operative width (cm): 2.2 Indications for Mohs surgery: anatomic location where tissue conservation is critical, tumor size greater than 2 cm and ill-defined borders  Micrographic Surgery Details: Post-operative length (cm): 4.2 Post-operative width (cm): 3.3 Number  of Mohs stages: 4 Post surgery depth of defect: skeletal muscle and cartilage  Stage 1    Tumor features identified on Mohs section: squamous cell carcinoma  Stage 2    Tumor features identified on Mohs section: squamous cell carcinoma  Stage 3    Tumor features identified on Mohs section: squamous cell carcinoma  Stage 4    Tumor features identified on Mohs section: no tumor identified    Depth of tumor invasion after stage: cartilage  Patient tolerance of procedure: tolerated well, no immediate complications  Reconstruction: Was the defect reconstructed? Yes   Was reconstruction performed by the same Mohs surgeon? Yes   Setting of reconstruction: outpatient office When was reconstruction performed? same day Type of reconstruction: partial closure with second intent and graft Graft type: composite skin and cartilage Details of reconstruction: 4-0 Vicryl used to approximate mucosal edges of wound. 4-0 Prolene used to secure bolster over open wound. Delayed repair to be performed in 7 days.   Opioids: Did the patient receive a prescription for opioid/narcotic related to Mohs surgery? Yes   Indications for opioid/narcotics: patient required additional pain relief despite trial of non-opioid analgesia  Antibiotics: Does patient meet AHA guidelines for endocarditis?: No   Does patient meet AHA guidelines for orthopedic prophylaxis?: No   Were antibiotics given on the day of surgery?: No   Did surgery breach mucosa, expose cartilage/bone, involve an area of lymphedema/inflamed/infected tissue? No    Specimen 1 - Surgical pathology Differential Diagnosis: SCC R/O PNI  Check Margins: No This Visit - traMADol  (ULTRAM ) 50 MG tablet - Take 1 tablet (50 mg total) by mouth every 6 (six) hours as needed for up to 10 doses. - mupirocin  ointment (BACTROBAN ) 2 % - Apply 1 Application topically 2 (two) times  daily. - gentamicin  ointment (GARAMYCIN ) 0.1 % - Apply 1 Application topically 3  (three) times daily. - doxycycline  (VIBRA -TABS) 100 MG tablet - Take 1 tablet orally twice daily for 7 days. TAKE WITH FOOD.   Return in about 4 weeks (around 03/10/2024) for wound check.  Christina [name], CMA, am acting as scribe for [name], MD.    02/11/2024  HISTORY OF PRESENT ILLNESS  Christina [name] is seen in consultation at the request of Dr. Raymund for biopsy-proven Well Differentiated Squamous Cell Carcinoma of the nasal tip. They note that the area has been present for about 6 months increasing in size with time.  There is no history of previous treatment.  Reports no other new or changing lesions and has no other complaints today.  Medications and allergies: see patient chart.  Review of systems: Reviewed 8 systems and notable for the above skin cancer.  All other systems reviewed are unremarkable/negative, unless noted in the HPI. Past medical history, surgical history, family history, social history were also reviewed and are noted in the chart/questionnaire.    PHYSICAL EXAMINATION  General: Well-appearing, in no acute distress, alert and oriented x 4. Vitals reviewed in chart (if available).   Skin: Exam reveals a 2.0 x 2.2 cm erythematous papule and biopsy scar on the nasal tip. There are rhytids, telangiectasias, and lentigines, consistent with photodamage.  Biopsy report(s) reviewed, confirming the diagnosis.   ASSESSMENT  1) Well Differentiated Squamous Cell Carcinoma on the nasal tip 2) photodamage 3) solar lentigines   PLAN   1. Due to location, size, histology, or recurrence and the likelihood of subclinical extension as well as the need to conserve normal surrounding tissue, the patient was deemed acceptable for Mohs micrographic surgery (MMS).  The nature and purpose of the procedure, associated benefits and risks including recurrence and scarring, possible complications such as pain, infection, and bleeding, and alternative methods of treatment if  appropriate were discussed with the patient during consent. The lesion location was verified by the patient, by reviewing previous notes, pathology reports, and by photographs as well as angulation measurements if available.  Informed consent was reviewed and signed by the patient, and timeout was performed at 8:30 AM. See op note below.  2. For the photodamage and solar lentigines, sun protection discussed/information given on OTC sunscreens, and we recommend continued regular follow-up with primary dermatologist every 6 months or sooner for any growing, bleeding, or changing lesions. 3. Prognosis and future surveillance discussed. 4. Letter with treatment outcome sent to referring provider. 5. Pain acetaminophen /oxycodone  5 mg  MOHS MICROGRAPHIC SURGERY AND RECONSTRUCTION  Initial size:   2.2 x 2.0 cm Surgical defect/wound size: 4.2 x 3.3 cm Anesthesia:    0.33% lidocaine  with 1:200,000 epinephrine  EBL:    <5 mL Complications:  None Repair type:   Delayed Flap; Cartilage Graft today and Simple Repair of Nasal lining (1.1 cm) SQ suture:   5-0 Vicryl, 4-0 Vicryl Cutaneous suture:  5-0 Polyprolene, 5-0 Fast absorbing gut  Stages: 4  STAGE I: Anesthesia achieved with 0.5% lidocaine  with 1:200,000 epinephrine . ChloraPrep applied. 1 section(s) excised using Mohs technique (this includes total peripheral and deep tissue margin excision and evaluation with frozen sections, excised and interpreted by the same physician). The tumor was first debulked and then excised with an approx. 2 mm margin.  Hemostasis was achieved with electrocautery as needed.  The specimen was then oriented, subdivided/relaxed, inked, and processed using Mohs technique.    Frozen section analysis  revealed a positive margin for atypical epithelial cells with squamous differentiation in the dermis in the deep and peripheral margin.    STAGE II: An additional 2 mm margin was excised.  Hemostasis was achieved with electrocautery  as needed.  The specimen was then oriented, subdivided/relaxed, inked, and processed using Mohs technique.   Frozen section analysis revealed a positive margin for atypical epithelial cells with squamous differentiation in the dermis in the deep margin.  STAGE III: An additional 2 mm margin was excised.  Hemostasis was achieved with electrocautery as needed.  The specimen was then oriented, subdivided/relaxed, inked, and processed using Mohs technique.   Frozen section analysis revealed a positive margin for atypical epithelial cells with squamous differentiation in the dermis in the deep margin.  STAGE IV: An additional 2 mm margin was excised.  Hemostasis was achieved with electrocautery as needed.  The specimen was then oriented, subdivided/relaxed, inked, and processed using Mohs technique. Evaluation of slides by the Mohs surgeon revealed clear tumor margins.   Reconstruction  Delayed Skin Flap  The decision to delay the skin flap or the reconstruction of the Mohs wound until one week post-procedure is based on the goal of allowing the wound to undergo secondary intention healing after the cartilage graft and letting the patient rest. This approach facilitates the gradual granulation of tissue and the reduction of wound depth, which can improve the overall quality and viability of the eventual flap site. By allowing the wound to heal partially through secondary intention, we anticipate enhanced wound bed preparation, optimizing conditions for and minimizing the risk of flap failure. This waiting period also allows for a better assessment of the wounds healing potential and ensures that the tissue is sufficiently vascularized, reducing the likelihood of complications such as flap necrosis or infection. Therefore, the delayed flaps are a strategic measure to enhance the long-term outcome of the reconstructive procedure.  Cartilage Graft Following Mohs surgery, auricular cartilage was harvested  from the conchal bowl and fashioned to the appropriate size and contour. The cartilage graft was placed to reconstruct and support the nasal defect involving the septum and nasal tip. The graft was secured and bolstered into position using 5-0 and 4-0 Vicryl sutures to ensure stability. A Xeroform bolster dressing was applied over the graft to maintain contour and fixation. The bolster will remain in place, with plans for staged reconstruction and likely flap placement at the following weeks procedure. The patient tolerated the procedure well without complication.  Simple Repair- Nasal Lining- 1.1 cm The surgical wound was then cleaned, prepped, and re-anesthetized as above. Hemostasis was achieved with electrocautery. Subcutaneous and epidermal tissues were approximated with the above sutures using guiding sutures/purse-string technique. The surgical site was then lightly scrubbed with sterile, saline-soaked gauze. The area was then bandaged using Vaseline ointment, Xeroform, non-adherent gauze, gauze pads, and tape to provide an adequate pressure dressing. The patient tolerated the procedure well, was given detailed written and verbal wound care instructions, and was discharged in good condition.   The patient will follow-up: 7 days.  Based on [facility]) staging criteria, this squamous cell carcinoma demonstrates multiple high-risk features, including tumor diameter greater than 2 cm, invasion beyond the subcutaneous fat, and histologic concern for moderately to poorly differentiated tumor cells identified on Mohs frozen sections. Although perineural invasion was not identified on the Mohs sections, the tissue block has been submitted to Dermatopathology for permanent section evaluation to further assess for PNI. Given these high-risk features per  [facility] criteria, a CT scan is recommended for regional lymph node assessment, and consideration should be given to adjuvant postoperative  radiation therapy pending final pathology and imaging results. We will re-discuss this at the next visit.    Documentation: I have reviewed the above documentation for accuracy and completeness, and I agree with the above.  [name], MD  "

## 2024-02-11 NOTE — Patient Instructions (Signed)

## 2024-02-12 ENCOUNTER — Ambulatory Visit: Admitting: Dermatology

## 2024-02-12 ENCOUNTER — Encounter: Payer: Self-pay | Admitting: Dermatology

## 2024-02-12 DIAGNOSIS — C4492 Squamous cell carcinoma of skin, unspecified: Secondary | ICD-10-CM

## 2024-02-12 DIAGNOSIS — T1490XD Injury, unspecified, subsequent encounter: Secondary | ICD-10-CM

## 2024-02-12 DIAGNOSIS — R58 Hemorrhage, not elsewhere classified: Secondary | ICD-10-CM

## 2024-02-12 NOTE — Progress Notes (Addendum)
" ° °  Follow Up Visit   Subjective  Christina [name] is a 89 y.o. female who presents for the following: follow up from Mohs surgery   The patient presents for follow up from Mohs surgery for a squamous cell carcinoma on the nasal tip, treated on 02/11/2024, patient is currently healing second intention pending repair on 08/17/2024. The patient has been bandaging the wound as directed. The endorse the following concerns: bleeding.   Patient's spouse reports that the bleeding started at 4am from the ear. They thought about calling EMS but decided against it. Reports that there has been bleeding from the nose.   The following portions of the chart were reviewed this encounter and updated as appropriate: medications, allergies, medical history  Review of Systems:  No other skin or systemic complaints except as noted in HPI or Assessment and Plan.  Objective  Well appearing patient in no apparent distress; mood and affect are within normal limits.  A focal examination was performed including the nose and right ear. All findings within normal limits unless otherwise noted below.   Healing wound with mild erythema  Relevant physical exam findings are noted in the Assessment and Plan.    Assessment & Plan   Healing Wound s/p Mohs for SCC on the nasal tip, treated on 02/11/2024, pending repair.  - Reassured that wound is healing well - No evidence of infection - No swelling, induration, purulence, dehiscence, or tenderness out of proportion to the clinical exam, see photo above - Leave bandage in place until repair appointment.  - New bandage put in place.  Bleeding Exam: right conchal bowl and nasal cavity (right nostril)  Treatment Plan: Silver nitrate applied to nose  HISTORY OF SQUAMOUS CELL CARCINOMA OF THE SKIN - No evidence of recurrence today - No lymphadenopathy - Recommend regular full body skin exams - Recommend daily broad spectrum sunscreen SPF 30+ to sun-exposed areas,  reapply every 2 hours as needed.  - Call if any new or changing lesions are noted between office visits  Return in about 6 days (around 02/18/2024) for reconstruction.   Documentation: I have reviewed the above documentation for accuracy and completeness, and I agree with the above.  [name], MD "

## 2024-02-13 ENCOUNTER — Telehealth: Payer: Self-pay

## 2024-02-13 ENCOUNTER — Other Ambulatory Visit: Payer: Self-pay | Admitting: Dermatology

## 2024-02-13 DIAGNOSIS — C4492 Squamous cell carcinoma of skin, unspecified: Secondary | ICD-10-CM

## 2024-02-13 NOTE — Telephone Encounter (Signed)
 Copied from CRM [id number]. Topic: Clinical - Medical Advice >> Feb 13, 2024  3:27 PM Amy B wrote: Reason for CRM: Patient wants to know how her kidneys have worsened that she needs to spend $204 on medication for her kidneys.  She requests a call back to discuss [phone]

## 2024-02-13 NOTE — Telephone Encounter (Signed)
 Discussed that the pathology we sent from her mohs surgery showed PNI. Advised that we encourage her to have a consult with Dr. Izell to discuss radiation. Advised that she could have treatment performed in [place] but that we strongly encourage her to at least meet with Dr. Izell.  Discussed that an urgent CT scan has been ordered and that she may be hearing from them tomorrow and Monday.  Advised that we will discuss more about perineural invasion with her at her appointment on Tuesday for reconstruction.

## 2024-02-13 NOTE — Telephone Encounter (Signed)
 Called patient to discuss plan of care. Agrees with plan to start jardiance .

## 2024-02-17 ENCOUNTER — Encounter: Payer: Self-pay | Admitting: Dermatology

## 2024-02-17 ENCOUNTER — Telehealth: Payer: Self-pay

## 2024-02-17 NOTE — Telephone Encounter (Signed)
 Copied from CRM #[id number]. Topic: Clinical - Prescription Issue >> Feb 17, 2024  2:44 PM Selinda RAMAN wrote: Reason for CRM: The patient called in stating she met with her provider on 12/30 and was prescribed azithromycin  (ZITHROMAX ) 500 MG as well as predniSONE  (DELTASONE ) 20 MG and she says that neither seem to help too much. She still continues to have a cough and build up of phlegm. Is there anything else she can do for the constant build up of phlegm. She is also just finishing up on doxycycline  (VIBRA -TABS) 100 MG tablet that was prescribed by her Dermatologist after she had cancer removed from her nose. Please assist patient further as she uses  [facility] - [place], [place] - [address]  Phone: [phone] Fax: [phone]

## 2024-02-17 NOTE — Telephone Encounter (Signed)
 I recommend patient be seen in clinic for her symptoms

## 2024-02-18 ENCOUNTER — Ambulatory Visit: Admitting: Dermatology

## 2024-02-18 ENCOUNTER — Encounter: Payer: Self-pay | Admitting: Dermatology

## 2024-02-18 VITALS — BP 137/86 | Temp 98.3°F

## 2024-02-18 DIAGNOSIS — L570 Actinic keratosis: Secondary | ICD-10-CM

## 2024-02-18 DIAGNOSIS — D485 Neoplasm of uncertain behavior of skin: Secondary | ICD-10-CM

## 2024-02-18 DIAGNOSIS — Z86007 Personal history of in-situ neoplasm of skin: Secondary | ICD-10-CM

## 2024-02-18 DIAGNOSIS — C4492 Squamous cell carcinoma of skin, unspecified: Secondary | ICD-10-CM

## 2024-02-18 MED ORDER — TRAMADOL HCL 50 MG PO TABS: 50.0000 mg | ORAL_TABLET | Freq: Four times a day (QID) | ORAL | 0 refills | Status: DC | PRN

## 2024-02-18 NOTE — Progress Notes (Signed)
 "  Follow-Up Visit   Subjective  Christina [name] is a 89 y.o. female who presents for the following: Repair following Mohs surgery on 02/12/2024 for a Squamous Cell Carcinoma on the nasal tip. She is accompanied by her husband today.  Patient had a bowl of frosted flakes, coffee, vegatbale juice and half of a cinnoman roll.   The following portions of the chart were reviewed this encounter and updated as appropriate: medications, allergies, medical history  Review of Systems:  No other skin or systemic complaints except as noted in HPI or Assessment and Plan.  Objective  Well appearing patient in no apparent distress; mood and affect are within normal limits.  A focused examination was performed of the following areas: Nasal Tip Relevant physical exam findings are noted in the Assessment and Plan.   Mid Dorsum of Nose  Dorsum of Nose Erythematous thin papules/macules with gritty scale.   Assessment & Plan   Poorly Differentiated Squamous Cell Carcinoma with Perineurial Invasion The Mohs debulk specimen was sent to Dermatopathology for evaluation for perineural invasion (PNI). Pathologic review confirmed the presence of PNI involving a nerve measuring 0.13 mm in diameter and demonstrated poorly differentiated squamous cell carcinoma, resulting in upstaging of the tumor to [facility] T4 (4/4). These findings and their prognostic implications were discussed at length with the patient, including the associated increased risk of recurrence and metastasis. The importance of adjuvant postoperative radiation therapy was emphasized, and a referral to Dr. Izell was placed last week; I contacted her team again today, and they will be reaching out to the patient to schedule a consultation. We also discussed the importance of head and neck CT imaging for appropriate staging of the lesion, and the patient was advised to proceed with these studies. On examination today, there was no clinical evidence of cervical  lymphadenopathy. We proceeded with reconstruction as planned today.  Neoplasm of Skin- nasal dorsum A suspicious lesion was noted on the anterior nasal dorsum, concerning for an NMSC, which is where the pedicle of the dorsal nasal rotation was coming from. In order to clear the skin for the flap, we biopsied the lesion of concern, labeled as A in the photo and processed a frozen section biopsy.   The gross microscopic description is listed below: Microscopic Description: Sections show epidermis with focal atypia of basal and suprabasal keratinocytes characterized by nuclear pleomorphism, hyperchromasia, and disordered maturation, with overlying parakeratosis and solar elastosis in the underlying dermis, consistent with actinic keratosis. Adjacent to this, within the dermis, there is a well-defined cystic structure lined by stratified squamous epithelium with a preserved granular layer and filled with lamellated keratin debris, consistent with an epidermal inclusion cyst. No evidence of invasive carcinoma is identified.  The lesion was diagnosed as an Actinic Keratosis and Epidermal Inclusion cyst and was curetted.   RECONSTRUCTION  Surgical defect/wound size: 4.2 x 3.3 cm Anesthesia:    0.33% lidocaine  with 1:200,000 epinephrine  EBL:    <5 mL Complications:  None Repair type:   Adjacent Tissue Transfer (Dorsal Nasal Rotation Flap) and Full Thickness Skin Graft  SQ suture:   5-0 Monocryl, 4-0 Vicryl Cutaneous suture:  5-0 Polyprolene Final size of the repair: ATT: 8.0 x 5.0 = 40 cm^2, FTSG: 1.5 x 1.0 = 1.5 cm^2  Reconstruction  Wound Bed Preparation  The patient is being prepared for a flap. The wound bed is thoroughly assessed and prepared to optimize flap take and healing. The necrotic tissue is debrided with a sharp curette,  with the help of forceps and a curved iris to create a clean, viable base, and any infected tissue is removed to reduce the risk of graft failure. The depth of the  wound preparation extended to the cartilage. Hemostasis is achieved to prevent bleeding complications during the flap process. The wound edges are carefully undermined, and the underlying tissues are inspected for adequate vascularity to support the graft. The wound is then thoroughly irrigated, and the area is prepped with antiseptic solutions to minimize the risk of infection. Once the wound bed is adequately prepared, it is covered with a moist dressing in preparation for the skin graft placement. 4.2 x 3.3 cm   PROCEDURE: Dorsal Nasal Rotation Flap The nature of the procedure was discussed with the patient in detail, including alternatives.  The risks discussed included but not limited to potential for infection, bleeding, scar formation, and damage to underlying structures.  The patient understood the risks and signed the consent form (scanned into chart).  The wound was reconstructed with a rotation flap.  Local anesthesia was achieved with the anesthetic indicated above.  The operative site was prepped with a surgical antiseptic solution, and then draped with sterile towels to insure a sterile field.  The beveled edges of the wound were excised to 90 degrees relative to the surrounding skin.  The wound was undermined in all directions.  A curvilinear incision was made from the apex of the wound and extended in a direction to use relaxed skin tension lines and anatomic boundaries to greatest advantage.  The flap and surrounding tissue were undermined in the appropriate plane, being careful not to compromise the vascular supply of the flap.  Meticulous hemostasis was obtained with an electrosurgical device.  The flap was then rotated into the defect and the key suture was placed.  The secondary defect was then sutured in a layered fashion, and any standing cone was excised and sutured closed.  The flap tip was trimmed, as necessary, and sutured into place to fit precisely into the defect. The dimensions  of the flap were:  8.0 cm x 5.0 cm for a total flap surface area of 40 centimeters squared (cm2).     A sterile non-stick pressure dressing was applied, the wound care instruction handout was reviewed with the patient, and appropriate follow-up care was scheduled. The patient understands the need to return immediately for any signs of infection to include swelling, pain, purulent discharge, localized warmth, or fever.  Contact information was provided to the patient (including after-hours pager numbers).  Full Thickness Skin Graft  Operation:  Full thickness skin graft repair of the above MMS defect Indication:  Functional and aesthetic reconstruction of the above wound Donor site:  Burow's Triangle     FTSG suture:  5-0 Polyprolene Graft length and width: 1.5  x 1.0 cm Graft surface area: 1.5 cm2 Postoperative medications: None Complications:  None  Due to the size, depth, and location, apposition could not be obtained without significant tension and distortion of adjacent tissue and structures. We discussed repair options including graft, flap, and second intention. We chose to proceed with a full thickness skin graft for part of the defect.  Therefore, a template/measurement of the defect was made to pattern the excision in the donor site that was then aseptically prepped, anesthetized, and excised.  The donor defect was closed without significant tension.  Hemostasis was achieved with electrocautery.  The graft was then defatted, trimmed, and placed on the recipient site.  The  circumference of the graft was secured with the above suture.  Internal bolstering stitches were placed as needed.  The surgical site was then lightly scrubbed with sterile, saline-soaked gauze.  The area was then bandaged using Vaseline ointment, non-adherent gauze, gauze pads, and tape to provide an adequate pressure dressing.   The patient tolerated the procedure well, was given detailed written and verbal wound care  instructions, and was discharged in good condition.  Sun protection and sun avoidance was also discussed. The patient will follow-up in 8-10 days. HISTORY OF SQUAMOUS CELL CARCINOMA IN SITU (SCCIS) OF SKIN   NEOPLASM OF UNCERTAIN BEHAVIOR OF SKIN Mid Dorsum of Nose - Skin / nail biopsy Type of biopsy: tangential   Informed consent: discussed and consent obtained   Timeout: patient name, date of birth, surgical site, and procedure verified   Procedure prep:  Patient was prepped and draped in usual sterile fashion Prep type:  Isopropyl alcohol  Anesthesia: the lesion was anesthetized in a standard fashion   Local anesthetic: 0.25% Lidocaine  w/ epinephrine  1/100,000. Instrument used: DermaBlade   Hemostasis achieved with: pressure   Outcome: patient tolerated procedure well    SQUAMOUS CELL CARCINOMA OF SKIN   This Visit - traMADol  (ULTRAM ) 50 MG tablet - Take 1 tablet (50 mg total) by mouth every 6 (six) hours as needed for up to 12 doses. Existing Treatments - mupirocin  ointment (BACTROBAN ) 2 % - Apply 1 Application topically 2 (two) times daily. - gentamicin  ointment (GARAMYCIN ) 0.1 % - Apply 1 Application topically 3 (three) times daily. - doxycycline  (VIBRA -TABS) 100 MG tablet - Take 1 tablet orally twice daily for 7 days. TAKE WITH FOOD. AK (ACTINIC KERATOSIS) Dorsum of Nose - Destruction of lesion - Dorsum of Nose Complexity: simple   Destruction method: electrodesiccation and curettage   Informed consent: discussed and consent obtained   Timeout:  patient name, date of birth, surgical site, and procedure verified Procedure prep:  Patient was prepped and draped in usual sterile fashion Prep type:  Isopropyl alcohol  Anesthesia: the lesion was anesthetized in a standard fashion   Anesthetic:  1% lidocaine  w/ epinephrine  1-100,000 buffered w/ 8.4% NaHCO3 Hemostasis achieved with:  electrodesiccation Outcome: patient tolerated procedure well with no complications    Post-procedure details: wound care instructions given      Return in about 1 week (around 02/25/2024) for 7-10 day suture removal.  I, Rollene Gobble, RN, am acting as scribe for [name], MD .   Documentation: I have reviewed the above documentation for accuracy and completeness, and I agree with the above.  [name], MD  "

## 2024-02-18 NOTE — Patient Instructions (Addendum)
 Call radiology at [phone]       Primary Closure and Flap Wound Care Instructions Your wound was closed with stitches. Some stitches are under the skin and some are on top. Keep the white pressure bandage on and dry for 48 hours (72 hours if you take blood thinners). After removing the bandage: Gently wash the area with soap and water  twice a day Apply a thin layer of Vaseline, Aquaphor, or petroleum jelly Keep doing this until: Stitches dissolve or are removed (about 7-10 days) DO NOT LET A SCAB FORM OVER YOUR WOUND! If a thick scab forms, you may gently clean the edges with a Q-tip and a small amount of hydrogen peroxide You should cover the wound with a non-stick bandage  IN THE EVENT OF AN AFTER HOURS EMERGENCY (PAIN, BLEEDING, INFECTION), PLEASE CALL OUR OFFICE AND YOU BE DIRECTED TO THE ON CALL STAFF. MYCHART MESSAGES SENT AFTER HOURS WILL BE ADDRESSED ON THE NEXT BUSINESS DAY  What to Watch For After Surgery Bleeding Some light bleeding is normal in the first 24 hours. To lower bleeding risk: Do not lift more than 10 pounds for 1 week If surgery was on your face, head, or neck, do not bend or stoop for 72 hours If surgery was on an arm or leg, keep it raised as much as possible (Limit standing and walking if it was on the leg) If bleeding happens: Press firmly on the area for 30 minutes without looking If bleeding continues, press again for another 30 minutes Call the office if bleeding does not stop  Swelling and Bruising Swelling and bruising are normal. Use an ice pack for 15 minutes each hour during the first 1-2 days Wrap ice in a towel to keep bandages dry Keep the area raised when possible Use extra pillows if surgery was on the head or neck Raise arms or legs near heart level if surgery was there  Infection (Uncommon) Call the office if you notice: Increasing pain Redness or swelling Yellow drainage (pus) Symptoms starting several days after  surgery  Pain Control Some pain is normal. Rest, ice, and elevation help You may take Tylenol  (acetaminophen ) and Ibuprofen (Advil/Motrin) Do not take aspirin  for pain If you already take daily aspirin , continue it, but do not add extra Safe option (if allowed): Take 2 ibuprofen (200 mg each) 3 hours later take 2 Tylenol  (325 mg each) Keep alternating every 3 hours as needed If you cannot take ibuprofen: Take Tylenol  650 mg every 4-6 hours EXAMPLE: Time Medicine Dose  6:00 AM Ibuprofen 400 mg (2 tablets of 200 mg)  9:00 AM Tylenol  650 mg (2 tablets of 325 mg)  12:00 PM Ibuprofen 400 mg  3:00 PM Tylenol  650 mg  6:00 PM Ibuprofen 400 mg  9:00 PM Tylenol  650 mg   Healing and Scar Questions When will my scar fade? It takes about 1 year for the scar to mature. Redness usually fades over time. Can my wound open? Yes, the area is weak for the first 6 months. Avoid pulling or stretching it. When will feeling return? Numbness or tingling can last 1-2 years. Some numbness may be permanent. Nose stuffiness If surgery was on your nose, stuffiness can last several months and will slowly improve. When can I stop wound care? You may stop once the skin is fully healed with no open areas. Makeup and sunscreen You may use makeup and sunscreen once: Stitches are removed, and The wound is fully healed Use sunscreen daily  on healed skin.  Lumps, Puffiness, and Massage Small lumps under the skin are normal and will go away A small pimple along the scar can happen Use warm compresses Call if it does not improve Puffy scars can sometimes be treated -- call the office if concerned After 1 month, gently massage the scar 2-3 times a day  Scar Products No Scar products should be applied until 1 month after your surgery Scar creams are optional Plain Vaseline works very well and is safe Stop any product that causes irritation  Future Skin Checks You may develop skin cancer again. See  your dermatologist every 6-12 months Regular skin checks help find problems early     Important Information  Due to recent changes in healthcare laws, you may see results of your pathology and/or laboratory studies on MyChart before the doctors have had a chance to review them. We understand that in some cases there may be results that are confusing or concerning to you. Please understand that not all results are received at the same time and often the doctors may need to interpret multiple results in order to provide you with the best plan of care or course of treatment. Therefore, we ask that you please give us  2 business days to thoroughly review all your results before contacting the office for clarification. Should we see a critical lab result, you will be contacted sooner.   If You Need Anything After Your Visit  If you have any questions or concerns for your doctor, please call our main line at [phone] If no one answers, please leave a voicemail as directed and we will return your call as soon as possible. Messages left after 4 pm will be answered the following business day.   You may also send us  a message via MyChart. We typically respond to MyChart messages within 1-2 business days.  For prescription refills, please ask your pharmacy to contact our office. Our fax number is [phone].  If you have an urgent issue when the clinic is closed that cannot wait until the next business day, you can page your doctor at the number below.    Please note that while we do our best to be available for urgent issues outside of office hours, we are not available 24/7.   If you have an urgent issue and are unable to reach us , you may choose to seek medical care at your doctor's office, retail clinic, urgent care center, or emergency room.  If you have a medical emergency, please immediately call 911 or go to the emergency department. In the event of inclement weather, please call our main  line at [phone] for an update on the status of any delays or closures.  Dermatology Medication Tips: Please keep the boxes that topical medications come in in order to help keep track of the instructions about where and how to use these. Pharmacies typically print the medication instructions only on the boxes and not directly on the medication tubes.   If your medication is too expensive, please contact our office at [phone] or send us  a message through MyChart.   We are unable to tell what your co-pay for medications will be in advance as this is different depending on your insurance coverage. However, we may be able to find a substitute medication at lower cost or fill out paperwork to get insurance to cover a needed medication.   If a prior authorization is required to get your medication covered by your  insurance company, please allow us  1-2 business days to complete this process.  Drug prices often vary depending on where the prescription is filled and some pharmacies may offer cheaper prices.  The website www.goodrx.com contains coupons for medications through different pharmacies. The prices here do not account for what the cost may be with help from insurance (it may be cheaper with your insurance), but the website can give you the price if you did not use any insurance.  - You can print the associated coupon and take it with your prescription to the pharmacy.  - You may also stop by our office during regular business hours and pick up a GoodRx coupon card.  - If you need your prescription sent electronically to a different pharmacy, notify our office through [facility] or by phone at [phone]

## 2024-02-19 ENCOUNTER — Ambulatory Visit: Admitting: Dermatology

## 2024-02-19 ENCOUNTER — Encounter: Payer: Self-pay | Admitting: Dermatology

## 2024-02-19 ENCOUNTER — Other Ambulatory Visit: Payer: Self-pay

## 2024-02-19 DIAGNOSIS — R58 Hemorrhage, not elsewhere classified: Secondary | ICD-10-CM

## 2024-02-19 DIAGNOSIS — T1490XD Injury, unspecified, subsequent encounter: Secondary | ICD-10-CM

## 2024-02-19 DIAGNOSIS — C4492 Squamous cell carcinoma of skin, unspecified: Secondary | ICD-10-CM

## 2024-02-19 NOTE — Progress Notes (Signed)
 Order update for Neck CT

## 2024-02-19 NOTE — Patient Instructions (Signed)

## 2024-02-19 NOTE — Progress Notes (Signed)
" ° °  Follow Up Visit   Subjective  Christina [name] is a 89 y.o. female who walked into the office today after receiving a call and misunderstanding who the call was from. Due to the patient being present and her bandage being saturated, she was added to the schedule so we could perform a wound check and bandage change.   The patient had the dorsal nasal rotation flap repair performed yesterday, 02/18/2024, following a Mohs surgery on 02/12/2024 for a Poorly Differentiated SCC on the nasal tip. Patient is concerned about the amount of oozing she has had and the blood that is draining into her left eye.   Patient has been taking the Tramadol  that was prescribed to her. She took a dose last night and tried to sleep in a recliner, but did not get good rest. She has not had much of an appetite and has been nauseous since taking the medication at 8am this morning.   The following portions of the chart were reviewed this encounter and updated as appropriate: medications, allergies, medical history  Review of Systems:  No other skin or systemic complaints except as noted in HPI or Assessment and Plan.  Objective  Well appearing patient in no apparent distress; mood and affect are within normal limits.  A focal examination was performed including the nose. All findings within normal limits unless otherwise noted below.  Healing wound with mild erythema  Relevant physical exam findings are noted in the Assessment and Plan.    Assessment & Plan    Bleeding Wound s/p Mohs for a SCC on the nasal tip, treated on 02/12/2024 and repaired on 02/18/2024 with a nasal dorsal rotation flap. - Reassured that wound is healing well and that bleeding is due to Eliquis .  - No evidence of infection - Moderate swelling and bruising on bilateral lower eyelids and cheeks.  - Patient advised to keep bandage in place until bandage change tomorrow.  - Crackers provided in office so patient can comfortably take her pain  medication. Patient declined beverage due to having water  bottle present.   HISTORY OF SQUAMOUS CELL CARCINOMA OF THE SKIN - No evidence of recurrence today - No lymphadenopathy - Recommend regular full body skin exams - Recommend daily broad spectrum sunscreen SPF 30+ to sun-exposed areas, reapply every 2 hours as needed.  - Call if any new or changing lesions are noted between office visits   While in office, we facilitated scheduling her consult with Dr. Izell at the [facility] for radiation oncology. Patient is scheduled for Friday 02/21/2024 at 12:30. Patient verbalized understanding where the location of their office is and the time they need to arrive.  Patient has been scheduled for the CT at [facility] tomorrow, 02/20/2024 at 2pm. The patient and spouse are aware of the time and location of this appointment.      Return in about 1 day (around 02/20/2024) for 11am bandage change.  [name] Rollene Gobble, RN, am acting as scribe for [name], MD .   Documentation: I have reviewed the above documentation for accuracy and completeness, and I agree with the above.  [name], MD "

## 2024-02-20 ENCOUNTER — Ambulatory Visit: Admitting: Dermatology

## 2024-02-20 ENCOUNTER — Encounter: Payer: Self-pay | Admitting: Radiation Oncology

## 2024-02-20 ENCOUNTER — Ambulatory Visit
Admission: RE | Admit: 2024-02-20 | Discharge: 2024-02-20 | Disposition: A | Discharge status: Home / Self Care | Source: Ambulatory Visit | Attending: Dermatology | Admitting: Dermatology

## 2024-02-20 VITALS — BP 120/79 | HR 70

## 2024-02-20 DIAGNOSIS — T1490XD Injury, unspecified, subsequent encounter: Secondary | ICD-10-CM

## 2024-02-20 DIAGNOSIS — C4492 Squamous cell carcinoma of skin, unspecified: Secondary | ICD-10-CM | POA: Insufficient documentation

## 2024-02-20 DIAGNOSIS — R58 Hemorrhage, not elsewhere classified: Secondary | ICD-10-CM

## 2024-02-20 MED ORDER — IOHEXOL 300 MG/ML  SOLN
75.0000 mL | Freq: Once | INTRAMUSCULAR | Status: AC | PRN
  Administered 2024-02-20: 60 mL via INTRAVENOUS

## 2024-02-20 NOTE — Progress Notes (Signed)
" ° °  Follow Up Visit   Subjective  Christina [name] is a 89 y.o. female who walked into the office today after receiving a call requesting a bandage chance. Due to the patient being present and her bandage being saturated, she was added to the schedule so we could perform a wound check and bandage change. Bandage chance was performed today.   The patient had the dorsal nasal rotation flap repair performed on 02/18/2024, following a Mohs surgery on 02/12/2024 for a Poorly Differentiated SCC on the nasal tip. Patient is concerned about the amount of oozing she has had and the blood that is draining into her left eye.   Patient has been taking the Tramadol  that was prescribed to her. She took a dose last night and tried to sleep in a recliner, but did not get good rest. She has not had much of an appetite and has been nauseous since taking the medication at 8am this morning.   The following portions of the chart were reviewed this encounter and updated as appropriate: medications, allergies, medical history  Review of Systems:  No other skin or systemic complaints except as noted in HPI or Assessment and Plan.  Objective  Well appearing patient in no apparent distress; mood and affect are within normal limits.  A focal examination was performed including the nose. All findings within normal limits unless otherwise noted below.  Healing wound with mild erythema  Relevant physical exam findings are noted in the Assessment and Plan.     Assessment & Plan    Mild Bleeding Wound s/p Mohs for a SCC on the nasal tip, treated on 02/12/2024 and repaired on 02/18/2024 with a nasal dorsal rotation flap. - Reassured that wound is healing well and that bleeding is due to Christina [name] .  - No evidence of infection - Moderate swelling and bruising on bilateral lower eyelids and cheeks.  - Patient advised to keep bandage in place until bandage change tomorrow.  - Crackers provided in office so patient can comfortably  take her pain medication. Patient declined beverage due to having water  bottle present.   HISTORY OF SQUAMOUS CELL CARCINOMA OF THE SKIN - No evidence of recurrence today - No lymphadenopathy - Recommend regular full body skin exams - Recommend daily broad spectrum sunscreen SPF 30+ to sun-exposed areas, reapply every 2 hours as needed.  - Call if any new or changing lesions are noted between office visits   While in office, we facilitated scheduling her consult with Dr. Izell at the [facility] for radiation oncology. Patient is scheduled for Friday 02/21/2024 at 12:30. Patient verbalized understanding where the location of their office is and the time they need to arrive.  Patient has been scheduled for the CT at [facility] today, 02/20/2024 at 2pm. The patient and spouse are aware of the time and location of this appointment.   Return in about 4 days (around 02/24/2024) for wound check.  Christina [name], Mohs/Dermatology Tech am acting as a neurosurgeon for Christina CHRISTELLA HOLY, MD.    Documentation: I have reviewed the above documentation for accuracy and completeness, and I agree with the above.  Christina CHRISTELLA HOLY, MD "

## 2024-02-20 NOTE — Progress Notes (Addendum)
 Histology and Location of Primary Skin Cancer:  Malignant Neoplasm of Nasal Dorsum  Christina [name] presented with the following signs/symptoms months ago:  Patient noticed she developed a sore on the tip of her nose.  Past/Anticipated interventions by patient's surgeon/dermatologist for current problematic lesion, if any:  02/19/2023 Paci,MD Moh's surgery     History of Blistering sunburns, if any: None  SAFETY ISSUES: Prior radiation? None Pacemaker/ICD? None Possible current pregnancy? None Is the patient on methotrexate? None  Current Complaints / other details:   None

## 2024-02-20 NOTE — Progress Notes (Signed)
 " Radiation Oncology         [phone] ________________________________  Initial Outpatient Consultation  Name: Christina [name] MRN: [id number]  Date: 02/21/2024  DOB: [birth year]  RR:Rjmuzm, Isaiah LABOR, MD  Paci, Rufus HERO, MD   REFERRING PHYSICIAN: Corey Rufus HERO, MD  DIAGNOSIS: [phone]   ICD-10-CM   1. Malignant neoplasm of nose (HCC)  C76.0     2. 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 female who presented to Dr. Raymund at the [facility] on 12/18/23 for evaluation of a lesion located on her her nasal tip. Per encounter notes, the patient reported that the lesion was previously biopsied at [facility]; with results showing a pre-cancerous histology. She did not receive treatment and the lesion did not heal which prompted her to present to Dr. Raymund for further management.   Exam performed during her visit with Dr. Raymund on 11/12 confirmed a 2.5 cm pink scaly lesion located on the right nasal tip. The lesion was biopsied at that time and showed findings consistent with well differentiated squamous cell carcinoma extending to the base of the biopsy specimen; negative for PNI.   Mohs surgery was recommended and she was accordingly referred to Dr. Corey at [facility] and underwent Mohs procedure of the lesion on 02/11/24.    Per Dr. Armando subsequent communication with Dr. Izell, Pathologic review confirmed the presence of PNI involving a nerve measuring 0.13 mm in  diameter and demonstrated poorly differentiated squamous cell carcinoma, resulting in upstaging of the tumor to [facility] T4 (4/4).   Dr. Corey also noted that the lesion demonstrates multiple high risk features including: a tumor diameter of greater than 2 cm, invasion beyond the subcutaneous fat, and a histologic concern for moderately to poorly differentiated tumor cells identified on the Mohs frozen sections.   Also in the setting of these high-risk features, she presented for a CT of the neck and head yesterday (02/20/24) which collectively demonstrated: the soft tissue mass along the right side of the upper nose measuring 2.2 cm in the greatest extent; skin thickening overlaying the mass extending inferiorly along the right side of the nose without evidence of underlying osseous destruction or sclerosis; no evidence of cervical lymphadenopathy; and no acute intracranial findings.   More details from discussion with patient and husband today:   She has significant discomfort and sensitivity of the nose at the Mohs surgery site and describes the procedure as  painful. She has never had radiation therapy and is worried about how well the area will heal before starting treatment.  She has atrial fibrillation   Her medical history includes triple bypass surgery about 20 years ago, and she is followed by a vascular doctor for neck artery disease.  She lives with her husband and reports having a supportive family for treatment support.  Photos obtained by Dr. Corey on 02/18/24 (s/p Mohs performed on 02/11/24):       Photo obtained by Dr. Paci  on 02/11/24 (pre-Mohs):     PREVIOUS RADIATION THERAPY: No  PAST MEDICAL HISTORY:  has a past medical history of Adenomatous colon polyp, Anemia, Aortic atherosclerosis, Arthritis, Barrett's esophagus, Bilateral carotid artery disease, CKD (chronic kidney disease), stage III (HCC), COPD (chronic obstructive pulmonary disease) (HCC), Coronary artery disease  (04/21/2004), Dyspnea on exertion, GERD (gastroesophageal reflux disease), Hiatal hernia, Hyperlipidemia, Hypertension, Long term current use of anticoagulant, Macular degeneration, OSA on CPAP, PAD (peripheral artery disease) (12/10/2016), PAF (paroxysmal atrial fibrillation) (HCC), Peripheral edema, PSVT (paroxysmal supraventricular tachycardia), S/P CABG x 3 (04/2004), Schatzki's ring, Scoliosis of thoracolumbar spine, Squamous cell carcinoma of skin (12/18/2023), and Valvular heart disease.    PAST SURGICAL HISTORY: Past Surgical History:  Procedure Laterality Date   ABDOMINAL HYSTERECTOMY  1980   due to dysfunctional uterine bleeding   APPENDECTOMY  1980   BREAST SURGERY Left 2000   biopsy   CARDIAC CATHETERIZATION     COLONOSCOPY     CORONARY ARTERY BYPASS GRAFT  2006   ESOPHAGOGASTRODUODENOSCOPY (EGD) WITH PROPOFOL  N/A 06/08/2021   Procedure: ESOPHAGOGASTRODUODENOSCOPY (EGD) WITH PROPOFOL ;  Surgeon: Therisa Bi, MD;  Location: [facility];  Service: Gastroenterology;  Laterality: N/A;   HEMORRHOID SURGERY     INSERTION OF MESH  09/05/2021   Procedure: INSERTION OF MESH;  Surgeon: Jordis Laneta FALCON, MD;  Location: [facility] ORS;  Service: General;;   LOWER EXTREMITY ANGIOGRAPHY Right 02/25/2017   Procedure: LOWER EXTREMITY ANGIOGRAPHY;  Surgeon: Marea Selinda RAMAN, MD;  Location: [facility] INVASIVE CV LAB;  Service: Cardiovascular;  Laterality: Right;   LOWER EXTREMITY INTERVENTION  02/25/2017   Procedure: LOWER EXTREMITY INTERVENTION;  Surgeon: Marea Selinda RAMAN, MD;  Location: [facility] INVASIVE CV LAB;  Service: Cardiovascular;;   LUNG BIOPSY  1999   Negative   TONSILLECTOMY     XI ROBOTIC ASSISTED PARAESOPHAGEAL HERNIA REPAIR N/A 09/05/2021   Procedure: XI ROBOTIC ASSISTED PARAESOPHAGEAL HERNIA REPAIR, RNFA to assist;  Surgeon: Jordis Laneta FALCON, MD;  Location: [facility] ORS;  Service: General;  Laterality: N/A;    FAMILY HISTORY: family history includes Alzheimer's disease in her mother; CAD in her mother; Diabetes in  her paternal grandfather; Hearing loss in her son; Heart attack in her father; Heart disease in her sister; Hyperlipidemia in her mother; Hypertension in her mother; Lung disease in her sister.  SOCIAL HISTORY:  reports that she quit smoking about 19 years ago. Her smoking use included cigarettes. She started smoking about 24 years ago. She has a 2.5 pack-year smoking history. She has been exposed to tobacco smoke. She has never used smokeless tobacco. She reports that she does not drink alcohol  and does not use drugs.  ALLERGIES: Baclofen, Codeine, Plavix  [clopidogrel ], Sulfa antibiotics, and Latex  MEDICATIONS:  Current Outpatient Medications  Medication Sig Dispense Refill   acetaminophen  (TYLENOL ) 500 MG tablet Take 500 mg by mouth every 8 (eight) hours as needed for mild pain or moderate pain.     apixaban  (ELIQUIS ) 2.5 MG TABS tablet Take 1 tablet (2.5 mg total) by mouth 2 (two) times daily. 60 tablet 5   Ascorbic Acid (VITAMIN C) 1000 MG tablet Take 1,000 mg by mouth 2 (two) times daily.     azelastine  (ASTELIN ) 0.1 % nasal spray Place 1 spray into both nostrils daily as needed.     benzonatate  (TESSALON ) 200 MG capsule Take 1 capsule (200 mg total) by mouth 3 (three) times daily as needed for cough. 30 capsule 1   carboxymethylcellulose (REFRESH PLUS) 0.5 % SOLN 1 drop 3 (three)  times daily as needed.     doxycycline  (VIBRA -TABS) 100 MG tablet Take 1 tablet orally twice daily for 7 days. TAKE WITH FOOD. 14 tablet 0   empagliflozin  (JARDIANCE ) 10 MG TABS tablet Take 1 tablet (10 mg total) by mouth daily. 90 tablet 3   fluticasone  (FLONASE ) 50 MCG/ACT nasal spray Place 1 spray into both nostrils daily. 1 spray in each nostril every day 16 g 12   gentamicin  ointment (GARAMYCIN ) 0.1 % Apply 1 Application topically 3 (three) times daily. 15 g 1   hydroxychloroquine (PLAQUENIL) 200 MG tablet Take 200 mg by mouth daily.     ipratropium-albuterol  (DUONEB) 0.5-2.5 (3) MG/3ML SOLN Take 3 mLs by  nebulization 2 (two) times daily.     losartan  (COZAAR ) 50 MG tablet Take 1 tablet (50 mg total) by mouth daily. 90 tablet 3   metoprolol  tartrate (LOPRESSOR ) 50 MG tablet Take 1 tablet (50 mg total) by mouth 2 (two) times daily. 180 tablet 3   mupirocin  ointment (BACTROBAN ) 2 % Apply 1 Application topically 2 (two) times daily. 22 g 2   prednisoLONE acetate (PRED FORTE) 1 % ophthalmic suspension Place 1 drop into both eyes every 2 (two) hours while awake.     rosuvastatin  (CRESTOR ) 20 MG tablet Take 1 tablet (20 mg total) by mouth every evening. 90 tablet 0   spironolactone  (ALDACTONE ) 25 MG tablet Take 1 tablet (25 mg total) by mouth daily. 90 tablet 3   tobramycin  (TOBREX ) 0.3 % ophthalmic solution   5   torsemide  (DEMADEX ) 20 MG tablet Take 1 tablet (20 mg total) by mouth daily. 90 tablet 3   traMADol  (ULTRAM ) 50 MG tablet Take 1 tablet (50 mg total) by mouth every 6 (six) hours as needed for up to 12 doses. 12 tablet 0   No current facility-administered medications for this encounter.    REVIEW OF SYSTEMS:  Notable for that above.   PHYSICAL EXAM:  height is 5' 5 (1.651 m) and weight is 156 lb 12.8 oz (71.1 kg). Her temperature is 97.4 F (36.3 C) (abnormal). Her blood pressure is 127/65 and her pulse is 87. Her respiration is 20 and oxygen saturation is 95%.   General: Alert and oriented, in no acute distress  HEENT: Ecchymosis around eyes, mouth, and upper neck.  Bandages not removed today.  See photos above NECK: No cervical or supraclavicular lymphadenopathy. CHEST: Lungs clear to auscultation. CARDIOVASCULAR: Regular rate, irregular rhythm. ABDOMEN: Abdomen moderately distended, non-tender, no rigidity or guarding. EXTREMITIES: No ankle edema. Lymphatics: see Neck Exam Musculoskeletal:  well-nourished Neurologic:  No obvious focalities. Speech is fluent. Coordination is intact. Psychiatric: Judgment and insight are intact. Affect is appropriate.   ECOG = 1  0 -  Asymptomatic (Fully active, able to carry on all predisease activities without restriction)  1 - Symptomatic but completely ambulatory (Restricted in physically strenuous activity but ambulatory and able to carry out work of a light or sedentary nature. For example, light housework, office work)  2 - Symptomatic, <50% in bed during the day (Ambulatory and capable of all self care but unable to carry out any work activities. Up and about more than 50% of waking hours)  3 - Symptomatic, >50% in bed, but not bedbound (Capable of only limited self-care, confined to bed or chair 50% or more of waking hours)  4 - Bedbound (Completely disabled. Cannot carry on any self-care. Totally confined to bed or chair)  5 - Death   Raylene MCGREGOR, [facility]anne [facility], [place]  [place], et al. (1982). Toxicity and response criteria of the [employer]. Am. DOROTHA Bridges. Oncol. 5 (6): 649-55   LABORATORY DATA:  Lab Results  Component Value Date   WBC 6.0 02/04/2024   HGB 13.3 02/04/2024   HCT 40.5 02/04/2024   MCV 107 (H) 02/04/2024   PLT 161 02/04/2024   CMP     Component Value Date/Time   NA 142 02/10/2024 0946   K 5.1 02/10/2024 0946   CL 105 02/10/2024 0946   CO2 22 02/10/2024 0946   GLUCOSE 86 02/10/2024 0946   GLUCOSE 87 02/27/2023 1146   BUN 32 (H) 02/10/2024 0946   CREATININE 1.52 (H) 02/10/2024 0946   CALCIUM  9.4 02/10/2024 0946   PROT 6.3 02/04/2024 1032   ALBUMIN  4.1 02/04/2024 1032   AST 21 02/04/2024 1032   ALT 11 02/04/2024 1032   ALKPHOS 111 02/04/2024 1032   BILITOT 0.6 02/04/2024 1032   EGFR 33 (L) 02/10/2024 0946   GFRNONAA 55 (L) 02/27/2023 1146         RADIOGRAPHY: CT Soft Tissue Neck W Contrast Result Date: 02/20/2024 EXAM: CT NECK WITH CONTRAST 02/20/2024 02:06:23 PM TECHNIQUE: CT of the neck was performed with the administration of 60 mL of iohexol  (OMNIPAQUE ) 300 MG/ML solution. Multiplanar reformatted images are provided for review. Automated exposure control,  iterative reconstruction, and/or weight based adjustment of the mA/kV was utilized to reduce the radiation dose to as low as reasonably achievable. COMPARISON: No CT neck for 01/18/2024. CLINICAL HISTORY: Skin cancer, staging; [facility] SCC 4/4 nose. FINDINGS: AERODIGESTIVE TRACT: No discrete mass. No edema. SALIVARY GLANDS: The parotid and submandibular glands are unremarkable. THYROID : Unremarkable. LYMPH NODES: No significant adenopathy is present. SOFT TISSUES: Soft tissue mass present along the right side of the upper nose measuring 9 x 11 x 22 mm. Skin thickening underlies this lesion and extends more inferiorly along the right side of the nose. No underlying osseous destruction or sclerosis is present. No fluid collection. BONES: No abnormality. VASCULATURE: Atherosclerotic changes are present at the carotid bifurcations bilaterally with luminal narrowing to 1.8 mm. This compares to 3.1 mm more distally. No significant stenosis of greater than 50% is present. Atherosclerotic calcifications are present at the aortic arch and great vessel origins. OTHER: Visualized sinuses and mastoid air cells are well aerated. Visualized lungs are clear. IMPRESSION: 1. Soft tissue mass along the right side of the upper nose measuring 9 x 11 x 22 mm, with overlying skin thickening extending inferiorly along the right side of the nose, without underlying osseous destruction or sclerosis. 2. No cervical adenopathy. Electronically signed by: Lonni Necessary MD 02/20/2024 02:50 PM EST RP Workstation: [id number]   CT HEAD WO CONTRAST ( ) Result Date: 02/20/2024 CLINICAL DATA:  Skin cancer staging. EXAM: CT HEAD WITHOUT CONTRAST TECHNIQUE: Contiguous axial images were obtained from the base of the skull through the vertex without intravenous contrast. RADIATION DOSE REDUCTION: This exam was performed according to the departmental dose-optimization program which includes automated exposure control, adjustment of the mA and/or kV  according to patient size and/or use of iterative reconstruction technique. COMPARISON:  04/20/2004 FINDINGS: Brain: Ventricles, cisterns and other CSF spaces are within normal. There is minimal chronic ischemic microvascular disease present. Calcifications over the left basal ganglia. No mass, mass effect, shift of midline structures or acute hemorrhage. Vascular: No hyperdense vessel or unexpected calcification. Skull: Normal. Negative for fracture or focal lesion. Sinuses/Orbits: No acute finding. Other: None. IMPRESSION: 1. No acute findings. 2. Minimal chronic  ischemic microvascular disease. Electronically Signed   By: Toribio Agreste M.D.   On: 02/20/2024 14:14      IMPRESSION/PLAN: Cancer Staging  Squamous cell cancer of skin of nose Staging form: Cutaneous Carcinoma of the Head and Neck, AJCC 8th Edition - Clinical stage from 02/21/2024: Stage III (cT3, cN0, cM0) - Signed by Izell Domino, MD on 02/21/2024 Extraosseous extension: Absent  This is a delightful patient with high risk skin cancer of the nose.  Malignant neoplasm of the skin of the nose, post-Mohs surgery, planned radiation therapy Post-Mohs surgery. Radiation therapy planned for complete cancer cell eradication. CT scan shows no lymph node involvement. Radiation to target resection area with margin. Four-week regimen effective. Main side effects: nasal redness, chapped nostrils, fatigue. Rare risks: cartilage injury, nonhealing wound. - Coordinate with Dr. Paci for radiation timing.  I personally corresponded with her today  - Perform CT scan without IV contrast for planning. - Administer radiation five days a week for four weeks. - Monitor for side effects: redness, chapped nostrils, fatigue. - Schedule weekly check-ups for treatment progress. - Coordinate schedule to avoid appointment conflicts.   We discussed the nature of squamous cell carcinoma and the role radiotherapy plays in treatment today. We reviewed the benefits,  risks, and possible side effects from the treatment. Possible side effects include fatigue and skin irritation with peeling scabbing.  She may have some watering to her eyes during treatment.  Mucosal irritation to the nostril is also likely.  Permanent nostril hair loss is likely.  Some permanent skin changes may occur, but cosmesis is generally favorable after radiation therapy.  Nonhealing wound to nose is rare.   Patient expressed understanding of the treatment which is of curative intent. All questions were answered to their stated satisfaction. No guarantees of treatment given. A consent form was signed and placed in their chart today. We will schedule the patient for CT simulation and our next available opening; anticipate treatment to start in the first half of February to allow adequate healing and fabrication of custom bolus for homogeneous distribution of skin dose over her nose.  On date of service, in total, I spent 60 minutes on this encounter. Patient was seen in person.   __________________________________________   Domino Izell, MD  This document serves as a record of services personally performed by Domino Izell, MD. It was created on her behalf by Dorthy Fuse, a trained medical scribe. The creation of this record is based on the scribe's personal observations and the provider's statements to them. This document has been checked and approved by the attending provider.  "

## 2024-02-21 ENCOUNTER — Encounter: Payer: Self-pay | Admitting: Dermatology

## 2024-02-21 ENCOUNTER — Ambulatory Visit
Admission: RE | Admit: 2024-02-21 | Discharge: 2024-02-21 | Disposition: A | Discharge status: Home / Self Care | Source: Ambulatory Visit | Attending: Radiation Oncology | Admitting: Radiation Oncology

## 2024-02-21 ENCOUNTER — Ambulatory Visit: Payer: Self-pay | Admitting: Dermatology

## 2024-02-21 ENCOUNTER — Encounter: Payer: Self-pay | Admitting: Radiation Oncology

## 2024-02-21 VITALS — BP 127/65 | HR 87 | Temp 97.4°F | Resp 20 | Ht 65.0 in | Wt 156.8 lb

## 2024-02-21 DIAGNOSIS — M1990 Unspecified osteoarthritis, unspecified site: Secondary | ICD-10-CM | POA: Insufficient documentation

## 2024-02-21 DIAGNOSIS — I739 Peripheral vascular disease, unspecified: Secondary | ICD-10-CM | POA: Insufficient documentation

## 2024-02-21 DIAGNOSIS — Z85828 Personal history of other malignant neoplasm of skin: Secondary | ICD-10-CM | POA: Diagnosis not present

## 2024-02-21 DIAGNOSIS — Z951 Presence of aortocoronary bypass graft: Secondary | ICD-10-CM | POA: Diagnosis not present

## 2024-02-21 DIAGNOSIS — M419 Scoliosis, unspecified: Secondary | ICD-10-CM | POA: Insufficient documentation

## 2024-02-21 DIAGNOSIS — E785 Hyperlipidemia, unspecified: Secondary | ICD-10-CM | POA: Diagnosis not present

## 2024-02-21 DIAGNOSIS — Z7901 Long term (current) use of anticoagulants: Secondary | ICD-10-CM | POA: Insufficient documentation

## 2024-02-21 DIAGNOSIS — N183 Chronic kidney disease, stage 3 unspecified: Secondary | ICD-10-CM | POA: Diagnosis not present

## 2024-02-21 DIAGNOSIS — J449 Chronic obstructive pulmonary disease, unspecified: Secondary | ICD-10-CM | POA: Insufficient documentation

## 2024-02-21 DIAGNOSIS — Z87891 Personal history of nicotine dependence: Secondary | ICD-10-CM | POA: Diagnosis not present

## 2024-02-21 DIAGNOSIS — I129 Hypertensive chronic kidney disease with stage 1 through stage 4 chronic kidney disease, or unspecified chronic kidney disease: Secondary | ICD-10-CM | POA: Insufficient documentation

## 2024-02-21 DIAGNOSIS — I480 Paroxysmal atrial fibrillation: Secondary | ICD-10-CM | POA: Insufficient documentation

## 2024-02-21 DIAGNOSIS — Z7984 Long term (current) use of oral hypoglycemic drugs: Secondary | ICD-10-CM | POA: Diagnosis not present

## 2024-02-21 DIAGNOSIS — C760 Malignant neoplasm of head, face and neck: Secondary | ICD-10-CM

## 2024-02-21 DIAGNOSIS — C44321 Squamous cell carcinoma of skin of nose: Secondary | ICD-10-CM | POA: Diagnosis present

## 2024-02-21 DIAGNOSIS — I2510 Atherosclerotic heart disease of native coronary artery without angina pectoris: Secondary | ICD-10-CM | POA: Diagnosis not present

## 2024-02-21 DIAGNOSIS — R0989 Other specified symptoms and signs involving the circulatory and respiratory systems: Secondary | ICD-10-CM

## 2024-02-21 DIAGNOSIS — Z79899 Other long term (current) drug therapy: Secondary | ICD-10-CM | POA: Diagnosis not present

## 2024-02-21 DIAGNOSIS — Z860101 Personal history of adenomatous and serrated colon polyps: Secondary | ICD-10-CM | POA: Diagnosis not present

## 2024-02-21 DIAGNOSIS — Z8719 Personal history of other diseases of the digestive system: Secondary | ICD-10-CM | POA: Diagnosis not present

## 2024-02-21 DIAGNOSIS — G4733 Obstructive sleep apnea (adult) (pediatric): Secondary | ICD-10-CM | POA: Insufficient documentation

## 2024-02-21 NOTE — Progress Notes (Signed)
 Oncology Nurse Navigator Documentation   Met with patient during initial consult with Dr. Basilio Cairo. She was accompanied by her husband.  I introduced myself as her/their Navigator, explained my role as a member of the Care Team. Assisted with post-consult appt scheduling.  They verbalized understanding of information provided. I encouraged them to call with questions/concerns moving forward.  Hedda Slade, RN, BSN, OCN Head & Neck Oncology Nurse Navigator [facility] [place] [phone]

## 2024-02-24 NOTE — Progress Notes (Signed)
 Spoke with pt gave her results and recommendations

## 2024-02-25 ENCOUNTER — Ambulatory Visit: Admitting: Dermatology

## 2024-02-25 ENCOUNTER — Encounter: Payer: Self-pay | Admitting: Dermatology

## 2024-02-25 DIAGNOSIS — C4492 Squamous cell carcinoma of skin, unspecified: Secondary | ICD-10-CM

## 2024-02-25 DIAGNOSIS — T1490XD Injury, unspecified, subsequent encounter: Secondary | ICD-10-CM

## 2024-02-25 MED ORDER — TRAMADOL HCL 50 MG PO TABS: 50.0000 mg | ORAL_TABLET | Freq: Four times a day (QID) | ORAL | 0 refills | Status: AC | PRN

## 2024-02-25 NOTE — Progress Notes (Signed)
" ° °  Follow Up Visit   Subjective  Christina [name] is a 89 y.o. female who presents for the following: follow up from Mohs surgery   The patient presents for follow up from Mohs surgery for a SCC on the nasal tip, treated on 02/12/24, repaired with dorsal nasal rotation flap. The patient has been bandaging the wound as directed. The endorse the following concerns: none  Spouse has been bandaging the nose daily with Vaseline. They have not removed the bandage on her ear since the surgery, but have picked up the mupirocin  and gentamicin  ointment from the pharmacy.   Patients states that she took one of the Tramadol  before coming into the office today.   Patient met with Dr. Izell last week and is going to get fitted for her radiation mask on Tuesday, 2/27.  Patient is aware of her negative CT Results.    The following portions of the chart were reviewed this encounter and updated as appropriate: medications, allergies, medical history  Review of Systems:  No other skin or systemic complaints except as noted in HPI or Assessment and Plan.  Objective  Well appearing patient in no apparent distress; mood and affect are within normal limits.  A focal examination was performed including scalp, head, face.  All findings within normal limits unless otherwise noted below.  Healing wound with mild erythema  Relevant physical exam findings are noted in the Assessment and Plan.                 Assessment & Plan   Healing Wound s/p Mohs for SCC on the nasal tip, treated on 02/12/24, repaired with nasal rotation flap - Sutures on the flap removed today. Sutures around the graft will remain until follow up next week. - Reassured that wound is healing well - No evidence of infection - No swelling, induration, purulence, dehiscence, or tenderness out of proportion to the clinical exam, see photo above - Discussed that scars take up to 12 months to mature from the date of surgery - Ok to  continue ointment daily to wound under a bandage for another week - Advised that patient should not allow her nose to get wet in the shower.   HISTORY OF SQUAMOUS CELL CARCINOMA OF THE SKIN - No evidence of recurrence today - No lymphadenopathy - Recommend regular full body skin exams - Recommend daily broad spectrum sunscreen SPF 30+ to sun-exposed areas, reapply every 2 hours as needed.  - Call if any new or changing lesions are noted between office visit   Return in about 1 week (around 03/03/2024) for Wound Check.  I, Darice Smock, CMA, am acting as scribe for [name], MD.  [name] Rollene Gobble, RN, am acting as scribe for [name], MD .   Documentation: I have reviewed the above documentation for accuracy and completeness, and I agree with the above.  [name], MD  "

## 2024-02-25 NOTE — Patient Instructions (Signed)

## 2024-02-28 ENCOUNTER — Encounter: Payer: Self-pay | Admitting: Physician Assistant

## 2024-02-28 ENCOUNTER — Ambulatory Visit
Admission: RE | Admit: 2024-02-28 | Discharge: 2024-02-28 | Disposition: A | Source: Ambulatory Visit | Attending: Physician Assistant

## 2024-02-28 ENCOUNTER — Ambulatory Visit
Admission: RE | Admit: 2024-02-28 | Discharge: 2024-02-28 | Disposition: A | Attending: Physician Assistant | Admitting: Physician Assistant

## 2024-02-28 ENCOUNTER — Ambulatory Visit: Admitting: Physician Assistant

## 2024-02-28 ENCOUNTER — Ambulatory Visit: Payer: Self-pay

## 2024-02-28 VITALS — BP 125/69 | HR 69 | Resp 14

## 2024-02-28 DIAGNOSIS — J441 Chronic obstructive pulmonary disease with (acute) exacerbation: Secondary | ICD-10-CM

## 2024-02-28 DIAGNOSIS — R0989 Other specified symptoms and signs involving the circulatory and respiratory systems: Secondary | ICD-10-CM | POA: Insufficient documentation

## 2024-02-28 NOTE — Telephone Encounter (Signed)
 FYI Only or Action Required?: FYI only for provider: appointment scheduled on 1/23.  Patient was last seen in primary care on 02/04/2024 by Christina Isaiah LABOR, MD.  Called Nurse Triage reporting Cough and Shortness of Breath.  Symptoms began several weeks ago.  Interventions attempted: OTC medications: Coricidin.  Symptoms are: gradually worsening.  Triage Disposition: See HCP Within 4 Hours (Or PCP Triage)  Patient/caregiver understands and will follow disposition?: Yes    Pt with hx of OSA, COPD and afib with worsening productive coughing with clear mucus, worse at night for past few weeks. Episodes severe coughing. No increased SOB from baseline. No CP. No fever. No blood. Off and on wheezing when breathing in for several weeks. Speaking in clear continuous full sentences. No audible SOB or wheezing noted over the phone. Scheduled appt with different provider at home office today d/t no PCP availability within timeframe. Advised UC or ED for worsening symptoms.     Message from [place] C sent at 02/28/2024  8:48 AM EST  Summary: congestion concern / rx req   Reason for Triage: The patient would like to be contacted by a member of clinical staff to discuss  a prescription to help with their ongoing respiratory discomfort. The patient has recently had surgery on their nose and shares that their breathing and discomfort and causing them significant concern. The patient would like to be contacted to discuss further when possible         Reason for Disposition  Wheezing is present  Answer Assessment - Initial Assessment Questions 1. ONSET: When did the cough begin?      Several weeks ago  2. SEVERITY: How bad is the cough today?      Severe  3. SPUTUM: Describe the color of your sputum (e.g., none, dry cough; clear, white, yellow, green)     Clear  4. HEMOPTYSIS: Are you coughing up any blood? If Yes, ask: How much? (e.g., flecks, streaks, tablespoons, etc.)      No  5. DIFFICULTY BREATHING: Are you having difficulty breathing? If Yes, ask: How bad is it? (e.g., mild, moderate, severe)      No increase from baseline  6. FEVER: Do you have a fever? If Yes, ask: What is your temperature, how was it measured, and when did it start?     None  7. CARDIAC HISTORY: Do you have any history of heart disease? (e.g., heart attack, congestive heart failure)      Afib, atherosclerosis, CHF  8. LUNG HISTORY: Do you have any history of lung disease?  (e.g., pulmonary embolus, asthma, emphysema)     COPD, OSA  9. PE RISK FACTORS: Do you have a history of blood clots? (or: recent major surgery, recent prolonged travel, bedridden)     OSA and COPD  10. OTHER SYMPTOMS: Do you have any other symptoms? (e.g., runny nose, wheezing, chest pain)       Off and on wheezing when breathing in, nasal congestion  Protocols used: Cough - Acute Productive-A-AH

## 2024-02-28 NOTE — Addendum Note (Signed)
 Addended by: Jahni Nazar on: 02/28/2024 01:57 PM   Modules accepted: Orders

## 2024-02-28 NOTE — Progress Notes (Unsigned)
 " Established patient visit  Patient: Christina [name]   DOB: 08/17/1935   89 y.o. Female  MRN: [id number] Visit Date: 02/28/2024  Today's healthcare provider: Jolynn Spencer, PA-C   Chief Complaint  Patient presents with   Cough    Patient here with worsening productive coughing with clear mucus, worse at night for past few weeks. Episodes severe coughing. No increased SOB from baseline. No CP. No fever. No blood. Off and on wheezing when breathing in for several weeks.   Subjective     HPI     Cough    Additional comments: Patient here with worsening productive coughing with clear mucus, worse at night for past few weeks. Episodes severe coughing. No increased SOB from baseline. No CP. No fever. No blood. Off and on wheezing when breathing in for several weeks.      Last edited by Rosas, Joseline E, CMA on 02/28/2024  1:15 PM.       Discussed the use of AI scribe software for clinical note transcription with the patient, who gave verbal consent to proceed.  History of Present Illness        02/20/2024   12:00 PM 02/04/2024    9:50 AM 12/27/2022    1:53 PM  Depression screen PHQ 2/9  Decreased Interest 0 3 1  Down, Depressed, Hopeless 0 2 2  PHQ - 2 Score 0 5 3  Altered sleeping  3 3  Tired, decreased energy  3 3  Change in appetite  0 0  Feeling bad or failure about yourself   0 1  Trouble concentrating  0 0  Moving slowly or fidgety/restless  1 0  Suicidal thoughts  0 0  PHQ-9 Score  12 10   Difficult doing work/chores  Somewhat difficult Somewhat difficult     Data saved with a previous flowsheet row definition      02/04/2024    9:51 AM 12/27/2022    1:57 PM  GAD 7 : Generalized Anxiety Score  Nervous, Anxious, on Edge 0  1   Control/stop worrying 1  0   Worry too much - different things 1  3   Trouble relaxing 1  2   Restless 1  0   Easily annoyed or irritable 1  1   Afraid - awful might happen 1  3   Total GAD 7 Score 6 10  Anxiety Difficulty  Somewhat difficult Somewhat difficult     Data saved with a previous flowsheet row definition    Medications: Show/hide medication list[1]  Review of Systems All negative Except see HPI   {Insert previous labs (optional):23779} {See past labs  Heme  Chem  Endocrine  Serology  Results Review (optional):1}   Objective    BP 125/69 (BP Location: Left Arm, Patient Position: Sitting, Cuff Size: Large)   Pulse 69   Resp 14   SpO2 96%  {Insert last BP/Wt (optional):23777}{See vitals history (optional):1}   Physical Exam   No results found for any visits on 02/28/24.      Assessment and Plan Assessment & Plan     No orders of the defined types were placed in this encounter.   No follow-ups on file.   The patient was advised to call back or seek an in-person evaluation if the symptoms worsen or if the condition fails to improve as anticipated.  I discussed the assessment and treatment plan with the patient. The patient was provided an opportunity to ask questions and all  were answered. The patient agreed with the plan and demonstrated an understanding of the instructions.  I, Burdett Pinzon, PA-C have reviewed all documentation for this visit. The documentation on 02/28/2024  for the exam, diagnosis, procedures, and orders are all accurate and complete.  Jolynn Spencer, [facility], MMS [facility] [phone] (phone) [phone] (fax)  [facility]    [1]  Outpatient Medications Prior to Visit  Medication Sig   acetaminophen  (TYLENOL ) 500 MG tablet Take 500 mg by mouth every 8 (eight) hours as needed for mild pain or moderate pain.   apixaban  (ELIQUIS ) 2.5 MG TABS tablet Take 1 tablet (2.5 mg total) by mouth 2 (two) times daily.   Ascorbic Acid (VITAMIN C) 1000 MG tablet Take 1,000 mg by mouth 2 (two) times daily.   azelastine  (ASTELIN ) 0.1 % nasal spray Place 1 spray into both nostrils daily as needed.   benzonatate  (TESSALON ) 200 MG  capsule Take 1 capsule (200 mg total) by mouth 3 (three) times daily as needed for cough.   carboxymethylcellulose (REFRESH PLUS) 0.5 % SOLN 1 drop 3 (three) times daily as needed.   empagliflozin  (JARDIANCE ) 10 MG TABS tablet Take 1 tablet (10 mg total) by mouth daily.   fluticasone  (FLONASE ) 50 MCG/ACT nasal spray Place 1 spray into both nostrils daily. 1 spray in each nostril every day   gentamicin  ointment (GARAMYCIN ) 0.1 % Apply 1 Application topically 3 (three) times daily.   hydroxychloroquine (PLAQUENIL) 200 MG tablet Take 200 mg by mouth daily.   ipratropium-albuterol  (DUONEB) 0.5-2.5 (3) MG/3ML SOLN Take 3 mLs by nebulization 2 (two) times daily.   losartan  (COZAAR ) 50 MG tablet Take 1 tablet (50 mg total) by mouth daily.   metoprolol  tartrate (LOPRESSOR ) 50 MG tablet Take 1 tablet (50 mg total) by mouth 2 (two) times daily.   mupirocin  ointment (BACTROBAN ) 2 % Apply 1 Application topically 2 (two) times daily.   prednisoLONE acetate (PRED FORTE) 1 % ophthalmic suspension Place 1 drop into both eyes every 2 (two) hours while awake.   rosuvastatin  (CRESTOR ) 20 MG tablet Take 1 tablet (20 mg total) by mouth every evening.   spironolactone  (ALDACTONE ) 25 MG tablet Take 1 tablet (25 mg total) by mouth daily.   tobramycin  (TOBREX ) 0.3 % ophthalmic solution    torsemide  (DEMADEX ) 20 MG tablet Take 1 tablet (20 mg total) by mouth daily.   traMADol  (ULTRAM ) 50 MG tablet Take 1 tablet (50 mg total) by mouth every 6 (six) hours as needed for up to 8 doses.   doxycycline  (VIBRA -TABS) 100 MG tablet Take 1 tablet orally twice daily for 7 days. TAKE WITH FOOD. (Patient not taking: Reported on 02/28/2024)   No facility-administered medications prior to visit.   "

## 2024-02-29 MED ORDER — ALBUTEROL SULFATE HFA 108 (90 BASE) MCG/ACT IN AERS: 2.0000 | INHALATION_SPRAY | Freq: Four times a day (QID) | RESPIRATORY_TRACT | 2 refills | Status: AC | PRN

## 2024-02-29 MED ORDER — AMOXICILLIN 875 MG PO TABS: 875.0000 mg | ORAL_TABLET | Freq: Two times a day (BID) | ORAL | 0 refills | Status: AC

## 2024-03-02 ENCOUNTER — Ambulatory Visit: Payer: Self-pay | Admitting: Physician Assistant

## 2024-03-02 NOTE — Progress Notes (Signed)
 Spoke with pt regarding her chest xr results.

## 2024-03-03 ENCOUNTER — Ambulatory Visit: Admitting: Dermatology

## 2024-03-03 ENCOUNTER — Ambulatory Visit: Admitting: Radiation Oncology

## 2024-03-04 ENCOUNTER — Encounter: Payer: Self-pay | Admitting: Dermatology

## 2024-03-04 ENCOUNTER — Ambulatory Visit: Admitting: Dermatology

## 2024-03-04 VITALS — BP 122/73 | HR 81 | Temp 97.6°F

## 2024-03-04 DIAGNOSIS — T1490XD Injury, unspecified, subsequent encounter: Secondary | ICD-10-CM

## 2024-03-04 DIAGNOSIS — C4492 Squamous cell carcinoma of skin, unspecified: Secondary | ICD-10-CM

## 2024-03-04 NOTE — Addendum Note (Signed)
 Addended by: COREY RUFUS HERO on: 03/04/2024 11:00 PM   Modules accepted: Level of Service

## 2024-03-04 NOTE — Patient Instructions (Signed)

## 2024-03-04 NOTE — Progress Notes (Signed)
" ° °  Follow Up Visit   Subjective  Christina [name] is a 89 y.o. female who presents for the following: follow up from Mohs surgery   The patient presents for follow up from Mohs surgery for a SCC on the nasal tip, treated on 02/12/24, repaired with dorsal nasal rotation flap. The patient has been bandaging the wound as directed . The patient has been bandaging the wound as directed. The endorse the following concerns: blowing her nose and not being able to use the C-pap. Accompanied by her husband.  The following portions of the chart were reviewed this encounter and updated as appropriate: medications, allergies, medical history  Review of Systems:  No other skin or systemic complaints except as noted in HPI or Assessment and Plan.  Objective  Well appearing patient in no apparent distress; mood and affect are within normal limits.  A focal examination was performed including face and  All findings within normal limits unless otherwise noted below.  Healing wound with mild erythema         Relevant physical exam findings are noted in the Assessment and Plan.    Assessment & Plan   Healing Wound s/p Mohs for SCC on the nasal tip, treated on 02/12/24, repaired with dorsal nasal rotation flap.   - Reassured that wound is healing well - No evidence of infection - No swelling, induration, purulence, dehiscence, or tenderness out of proportion to the clinical exam, see photo above - Discussed that scars take up to 12 months to mature from the date of surgery - Recommend SPF 30+ to scar daily to prevent purple color from UV exposure during scar maturation process - Discussed that erythema and raised appearance of scar will fade over the next 4-6 months - OK to start scar massage at 4-6 weeks post-op - Can consider silicone based products for scar healing starting at 6 weeks post-op - Ok to continue ointment daily to wound under a bandage for another week until returning for would check on  the right nasal tip  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 Pathologic review confirmed the presence of PNI involving a nerve measuring 0.13 mm in diameter and demonstrated poorly differentiated squamous cell carcinoma, resulting in upstaging of the tumor to [facility] T4 (4/4). CT scans were negative for lymph node involvement. Patient will proceed with radiation and will get fitted for her radiation mask next week (due to snow).   HISTORY OF SQUAMOUS CELL CARCINOMA OF THE SKIN - No evidence of recurrence today - Recommend regular full body skin exams - Recommend daily broad spectrum sunscreen SPF 30+ to sun-exposed areas, reapply every 2 hours as needed.  - Call if any new or changing lesions are noted between office visits    Return in about 1 week (around 03/11/2024) for wound check.  I, Doyce Pan, CMA, am acting as scribe for Christina CHRISTELLA HOLY, MD.   Documentation: I have reviewed the above documentation for accuracy and completeness, and I agree with the above.  Christina CHRISTELLA HOLY, MD  "

## 2024-03-09 ENCOUNTER — Ambulatory Visit: Admitting: Radiation Oncology

## 2024-03-11 ENCOUNTER — Telehealth: Payer: Self-pay

## 2024-03-11 ENCOUNTER — Ambulatory Visit: Admitting: Dermatology

## 2024-03-11 ENCOUNTER — Encounter: Payer: Self-pay | Admitting: Dermatology

## 2024-03-11 ENCOUNTER — Other Ambulatory Visit: Payer: Self-pay

## 2024-03-11 DIAGNOSIS — C4492 Squamous cell carcinoma of skin, unspecified: Secondary | ICD-10-CM

## 2024-03-11 DIAGNOSIS — E7800 Pure hypercholesterolemia, unspecified: Secondary | ICD-10-CM

## 2024-03-11 DIAGNOSIS — T1490XD Injury, unspecified, subsequent encounter: Secondary | ICD-10-CM

## 2024-03-11 MED ORDER — MUPIROCIN 2 % EX OINT: 1.0000 | TOPICAL_OINTMENT | Freq: Two times a day (BID) | CUTANEOUS | 2 refills | Status: AC

## 2024-03-11 NOTE — Progress Notes (Signed)
 "  Follow Up Visit   Subjective  Christina [name] is a 89 y.o. female who presents for the following: follow up from Mohs surgery   The patient presents for follow up from Mohs surgery for a SCC on the nasal tip, treated on 02/12/24, repaired with dorsal nasal rotation flap. The patient has been bandaging the wound as directed. The endorse the following concerns: none. They have been applying vaseline on the nasal tip.   The following portions of the chart were reviewed this encounter and updated as appropriate: medications, allergies, medical history  Review of Systems:  No other skin or systemic complaints except as noted in HPI or Assessment and Plan.  Objective  Well appearing patient in no apparent distress; mood and affect are within normal limits.  A focal examination was performed including scalp, head, face. All findings within normal limits unless otherwise noted below.  Healing wound with mild erythema  Relevant physical exam findings are noted in the Assessment and Plan.  Nose Healing granulation tissue with unattached ala to nasal tip   Assessment & Plan    Healing Wound s/p Mohs for SCC, treated on 02/12/24, repaired with dorsal nasal rotation flap. - Reassured that wound is healing well - No evidence of infection - No swelling, induration, purulence, dehiscence, or tenderness out of proportion to the clinical exam, see photo above - Discussed that scars take up to 12 months to mature from the date of surgery - Recommend SPF 30+ to scar daily to prevent purple color from UV exposure during scar maturation process - Discussed that erythema and raised appearance of scar will fade over the next 4-6 months - OK to start scar massage at 4-6 weeks post-op - Can consider silicone based products for scar healing starting at 6 weeks post-op - Ok to continue ointment daily to wound under a bandage for another week SQUAMOUS CELL CARCINOMA OF SKIN Nose - Skin repair Complexity:   Complex Final length (cm):  2.7 Informed consent: discussed and consent obtained   Timeout: patient name, date of birth, surgical site, and procedure verified   Procedure prep:  Patient was prepped and draped in usual sterile fashion Prep type:  Chlorhexidine  Anesthesia: the lesion was anesthetized in a standard fashion   Anesthetic:  1% lidocaine  w/ epinephrine  1-100,000 buffered w/ 8.4% NaHCO3 Reason for type of repair: reduce tension to allow closure, allow closure of the large defect, preserve normal anatomy and enhance both functionality and cosmetic results   Undermining: area extensively undermined   Subcutaneous layers (deep stitches):  Suture size:  5-0 Suture type: Vicryl (polyglactin 910)   Stitches:  Buried vertical mattress Fine/surface layer approximation (top stitches):  Suture size:  5-0 Suture type: Prolene (polypropylene)   Stitches: vertical mattress   Hemostasis achieved with: suture, pressure and electrodesiccation Outcome: patient tolerated procedure well with no complications   Post-procedure details: sterile dressing applied and wound care instructions given   Dressing type: bandage and pressure dressing    This Visit - mupirocin  ointment (BACTROBAN ) 2 % - Apply 1 Application topically 2 (two) times daily. Existing Treatments - gentamicin  ointment (GARAMYCIN ) 0.1 % - Apply 1 Application topically 3 (three) times daily. - doxycycline  (VIBRA -TABS) 100 MG tablet - Take 1 tablet orally twice daily for 7 days. TAKE WITH FOOD. - traMADol  (ULTRAM ) 50 MG tablet - Take 1 tablet (50 mg total) by mouth every 6 (six) hours as needed for up to 8 doses.  The surgical wound was then  cleaned, prepped, and re-anesthetized as above. Wound edges were undermined extensively along at least one entire edge and at a distance equal to or greater than the width of the defect (see wound defect size above) in order to achieve closure and decrease wound tension and anatomic distortion.  Redundant tissue repair including standing cone removal was performed. Hemostasis was achieved with electrocautery. Subcutaneous and epidermal tissues were approximated with the above sutures. The surgical site was then lightly scrubbed with sterile, saline-soaked gauze. The area was then bandaged using Vaseline ointment, non-adherent gauze, gauze pads, and tape to provide an adequate pressure dressing. The patient tolerated the procedure well, was given detailed written and verbal wound care instructions, and was discharged in good condition.    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 Pathologic review confirmed the presence of PNI involving a nerve measuring 0.13 mm in diameter and demonstrated poorly differentiated squamous cell carcinoma, resulting in upstaging of the tumor to [facility] T4 (4/4). CT scans were negative for lymph node involvement. Patient will proceed with radiation and will get fitted for her radiation mask. She state that she is supposed to meet with Dr. Izell this Friday 03/12/2024.   Return for 2/16 wound chgeck/suture removal.  I, Christina [name], CMA, am acting as scribe for Christina CHRISTELLA HOLY, MD.   Documentation: I have reviewed the above documentation for accuracy and completeness, and I agree with the above.  Christina CHRISTELLA HOLY, MD  "

## 2024-03-11 NOTE — Telephone Encounter (Unsigned)
 Copied from CRM #[id number]. Topic: Clinical - Medication Refill >> Mar 11, 2024 10:49 AM Joesph B wrote: Medication: rosuvastatin  (CRESTOR ) 20 MG tablet  Has the patient contacted their pharmacy? Yes (Agent: If no, request that the patient contact the pharmacy for the refill. If patient does not wish to contact the pharmacy document the reason why and proceed with request.) (Agent: If yes, when and what did the pharmacy advise?)  This is the patient's preferred pharmacy:  [facility] - [place], [place] - [address] [place] [address] Phone: [phone] Fax: [phone]  Is this the correct pharmacy for this prescription? Yes If no, delete pharmacy and type the correct one.   Has the prescription been filled recently? Yes  Is the patient out of the medication? Yes  Has the patient been seen for an appointment in the last year OR does the patient have an upcoming appointment? Yes  Can we respond through MyChart? Yes  Agent: Please be advised that Rx refills may take up to 3 business days. We ask that you follow-up with your pharmacy.

## 2024-03-11 NOTE — Telephone Encounter (Unsigned)
 Copied from CRM [id number]. Topic: Clinical - Refused Triage >> Mar 11, 2024 10:59 AM Joesph NOVAK wrote: Patient/caller voiced complaints of No energy, fatigue. States she is still dealing with a cough and phlegm. Just not feeling well.. Declined transfer to triage. She does not want to come in bc she has a lot of upcoming appointments.   If patient is unestablished, route message to [facility] Nurse Triage If patient is established, route message to the appropriate department clinical pool

## 2024-03-11 NOTE — Patient Instructions (Signed)

## 2024-03-12 ENCOUNTER — Other Ambulatory Visit: Payer: Self-pay | Admitting: Cardiology

## 2024-03-13 ENCOUNTER — Ambulatory Visit: Admission: RE | Admit: 2024-03-13 | Admitting: Radiation Oncology

## 2024-03-13 MED ORDER — ROSUVASTATIN CALCIUM 20 MG PO TABS: 20.0000 mg | ORAL_TABLET | Freq: Every evening | ORAL | 0 refills | Status: AC

## 2024-03-13 NOTE — Telephone Encounter (Signed)
 Requested Prescriptions  Pending Prescriptions Disp Refills   rosuvastatin  (CRESTOR ) 20 MG tablet 90 tablet 0    Sig: Take 1 tablet (20 mg total) by mouth every evening.     Cardiovascular:  Antilipid - Statins 2 Failed - 03/13/2024 10:04 AM      Failed - Cr in normal range and within 360 days    Creatinine, Ser  Date Value Ref Range Status  02/10/2024 1.52 (H) 0.57 - 1.00 mg/dL Final         Failed - Lipid Panel in normal range within the last 12 months    Cholesterol, Total  Date Value Ref Range Status  02/04/2024 114 100 - 199 mg/dL Final   LDL Chol Calc (NIH)  Date Value Ref Range Status  02/04/2024 45 0 - 99 mg/dL Final   HDL  Date Value Ref Range Status  02/04/2024 49 >39 mg/dL Final   Triglycerides  Date Value Ref Range Status  02/04/2024 112 0 - 149 mg/dL Final         Passed - Patient is not pregnant      Passed - Valid encounter within last 12 months    Recent Outpatient Visits           2 weeks ago Acute exacerbation of chronic obstructive pulmonary disease (COPD) (HCC)   [place] [facility] [place], Janna, PA-C   1 month ago COPD exacerbation [facility])   [facility] [place] Isaiah LABOR, MD       Future Appointments             In 3 months [place], Lauraine BROCKS, MD [facility]

## 2024-03-17 ENCOUNTER — Ambulatory Visit: Admitting: Radiation Oncology

## 2024-03-18 ENCOUNTER — Ambulatory Visit

## 2024-03-19 ENCOUNTER — Ambulatory Visit

## 2024-03-20 ENCOUNTER — Ambulatory Visit

## 2024-03-23 ENCOUNTER — Ambulatory Visit

## 2024-03-23 ENCOUNTER — Ambulatory Visit: Admitting: Dermatology

## 2024-03-24 ENCOUNTER — Ambulatory Visit: Admitting: Radiation Oncology

## 2024-03-24 ENCOUNTER — Ambulatory Visit

## 2024-03-25 ENCOUNTER — Ambulatory Visit

## 2024-03-26 ENCOUNTER — Ambulatory Visit

## 2024-03-27 ENCOUNTER — Ambulatory Visit

## 2024-03-30 ENCOUNTER — Ambulatory Visit

## 2024-03-31 ENCOUNTER — Ambulatory Visit: Admitting: Radiation Oncology

## 2024-03-31 ENCOUNTER — Ambulatory Visit

## 2024-04-01 ENCOUNTER — Ambulatory Visit

## 2024-04-01 IMAGING — CT CT ABD-PELV W/ CM
2 of 5 series · 14 of 46 positions shown, 16 images · IV contrast (APPLIED)
Comparison: CT abdomen pelvis 07/23/2019, CT abdomen pelvis
07/09/2013

CLINICAL DATA: Hiatal hernia

EXAM:
CT ABDOMEN AND PELVIS WITH CONTRAST
TECHNIQUE: Multidetector CT imaging of the abdomen and pelvis was performed
using the standard protocol following bolus administration of
intravenous contrast.

[Series 2: axial st · axial · 0.88mm/px · z∈[-726,-316]mm · 11 of 94 slices shown, 13 images]
[im 6/94  soft-tissue]
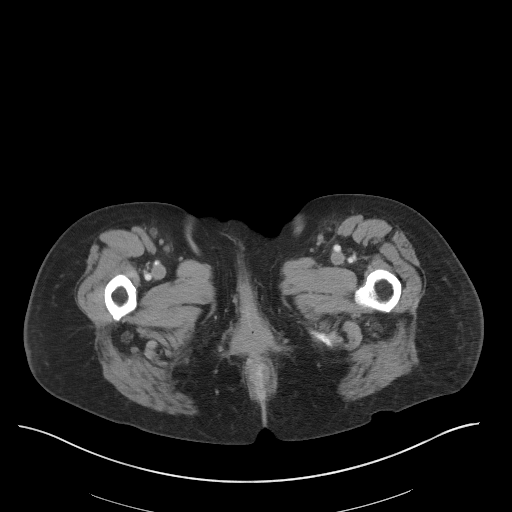
[im 6/94  bone]
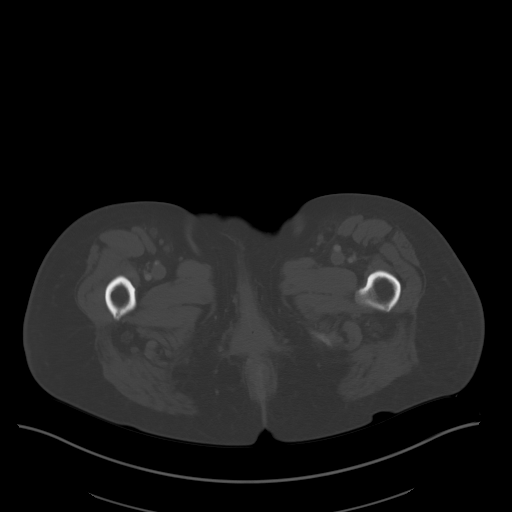
[im 17/94  soft-tissue]
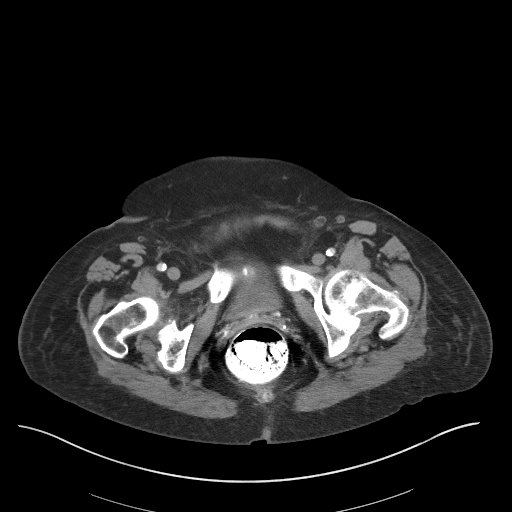
[im 22/94  soft-tissue]
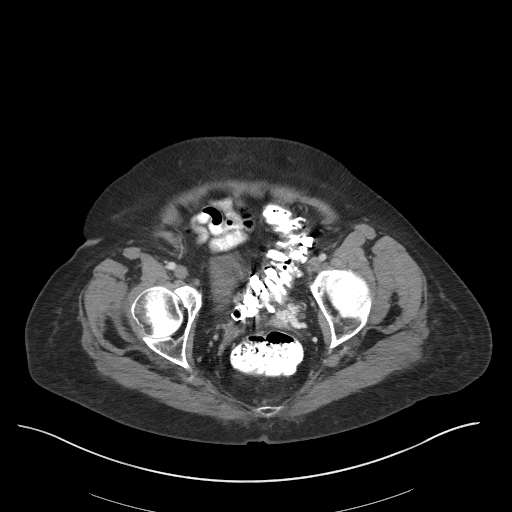
[im 33/94  soft-tissue]
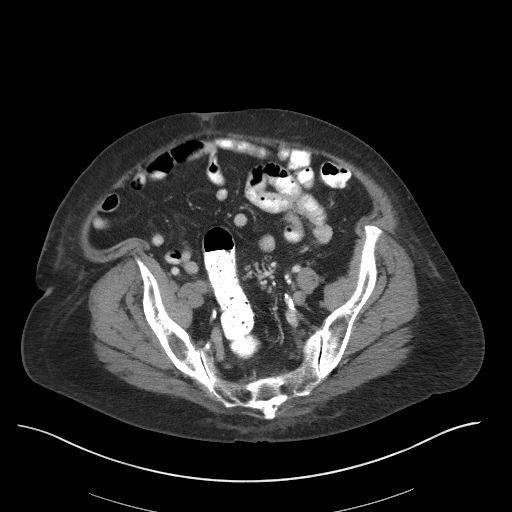
[im 39/94  soft-tissue]
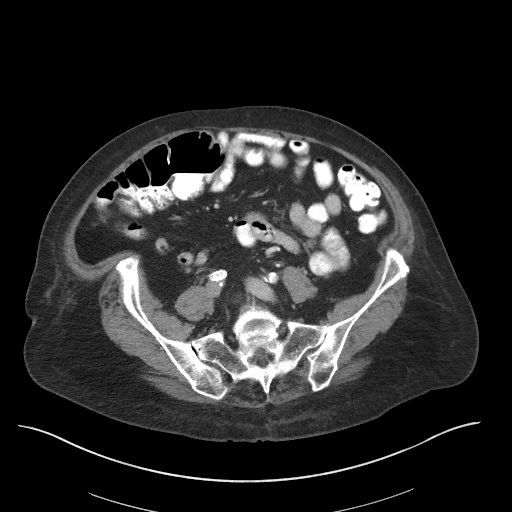
[im 50/94  soft-tissue]
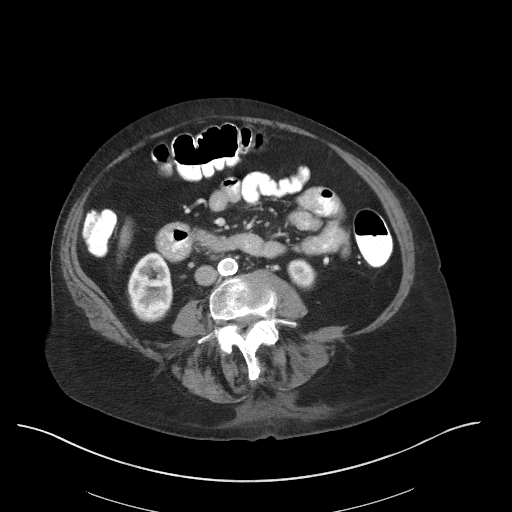
[im 55/94  soft-tissue]
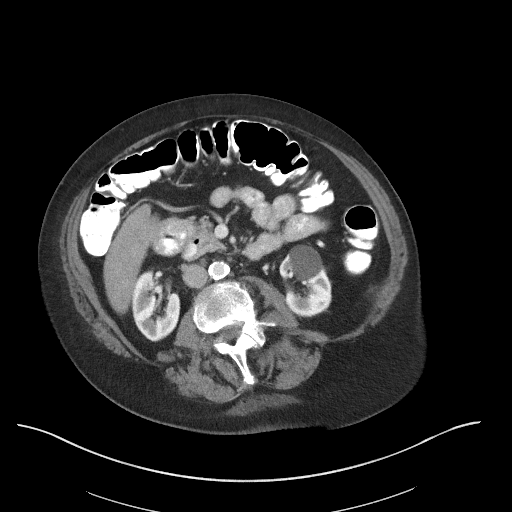
[im 61/94  soft-tissue]
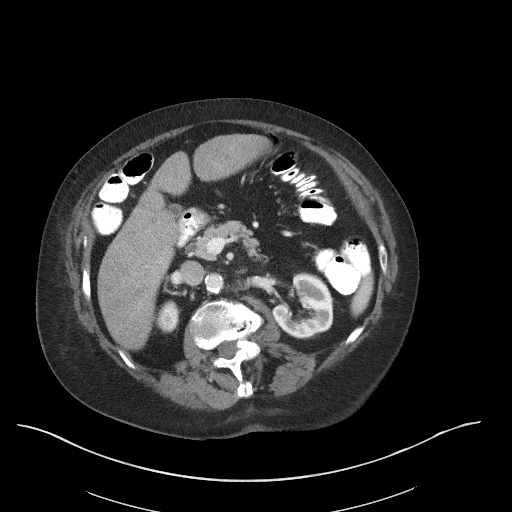
[im 72/94  soft-tissue]
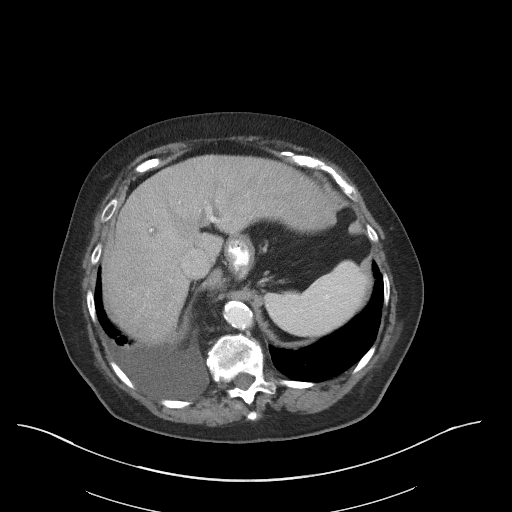
[im 72/94  bone]
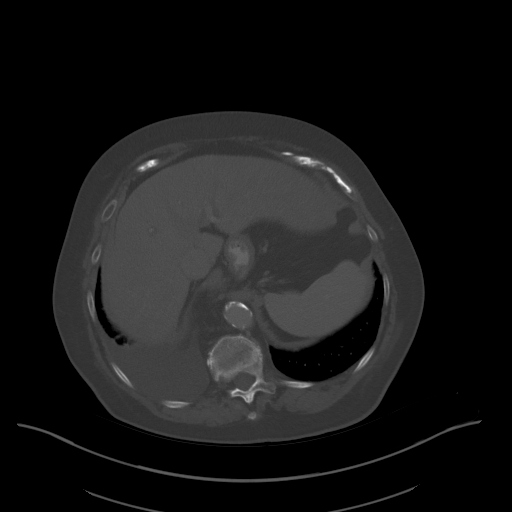
[im 77/94  soft-tissue]
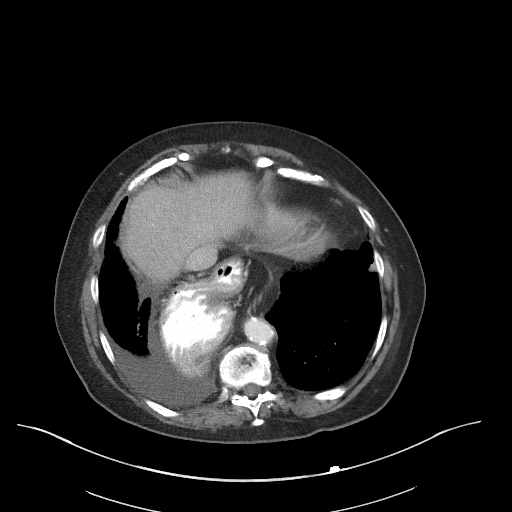
[im 88/94  soft-tissue]
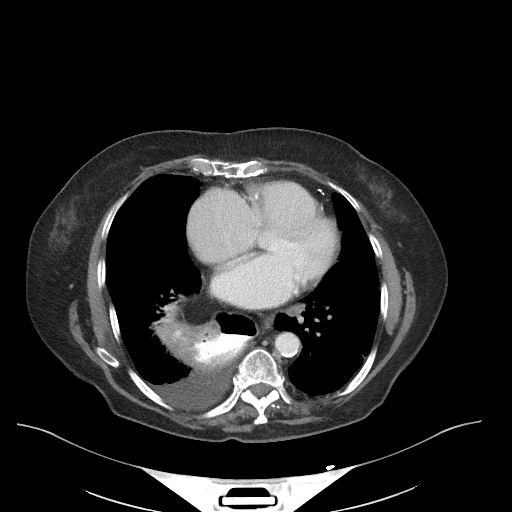

[Series 5: coronal st · coronal · 0.77mm/px · 3 of 97 slices shown]
[im 33/97  soft-tissue]
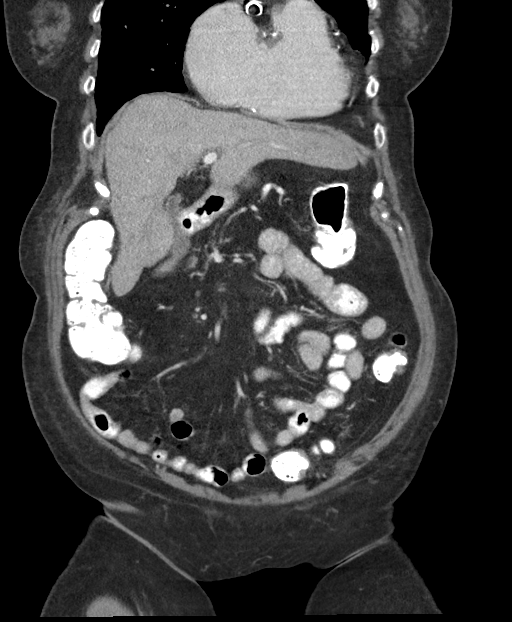
[im 43/97  soft-tissue]
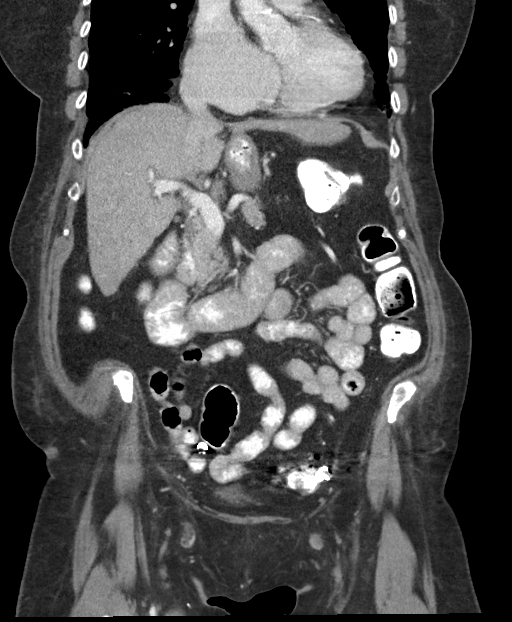
[im 54/97  soft-tissue]
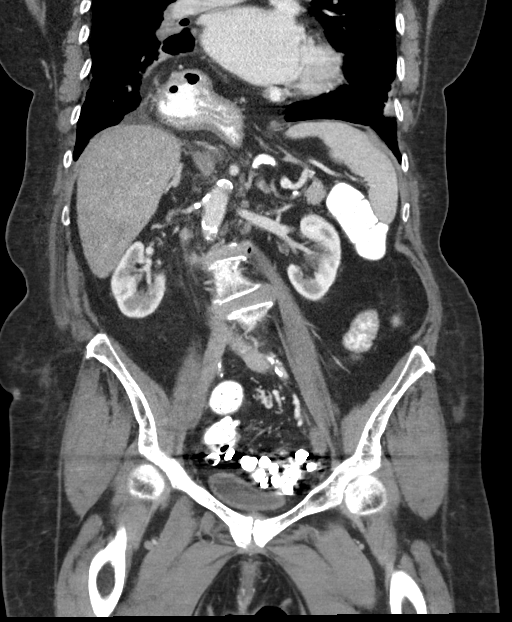

[14 of 46 positions shown; findings below may reference images not displayed]

RADIATION DOSE REDUCTION: This exam was performed according to the
departmental dose-optimization program which includes automated
exposure control, adjustment of the mA and/or kV according to
patient size and/or use of iterative reconstruction technique.

CONTRAST:  100mL OMNIPAQUE IOHEXOL 300 MG/ML  SOLN
FINDINGS: Lower chest:

Stable subpleural nodular like density measuring 1.1 cm along the
left major fissure ([DATE]). Stable triangular pulmonary micronodule
within the right middle lobe. Associated adjacent pulmonary
micronodules within right middle lobe ([DATE]). There is another 1 x
0.6 cm pulmonary nodule ([DATE]).

Stable large hiatal hernia containing almost the entirety of the
stomach.

Interval development of a trace right pleural effusion.

Hepatobiliary: No focal liver abnormality. No gallstones,
gallbladder wall thickening, or pericholecystic fluid. No biliary
dilatation.

Pancreas: No focal lesion. Normal pancreatic contour. No surrounding
inflammatory changes. No main pancreatic ductal dilatation.

Spleen: Normal in size without focal abnormality.

Adrenals/Urinary Tract:

No adrenal nodule bilaterally.

Bilateral kidneys enhance symmetrically. There is a 2.8 cm fluid
density lesion within the left kidney that likely represents a
simple renal cyst.

No hydronephrosis. No hydroureter.

The urinary bladder is unremarkable.

On delayed imaging, there is no urothelial wall thickening and there
are no filling defects in the opacified portions of the bilateral
collecting systems or ureters.

Stomach/Bowel: Limited evaluation of sigmoid colon due to streak
artifact originating from the PO contrast contained within the
diverticula. Stomach is within normal limits. No evidence of bowel
wall thickening or dilatation. Diffuse sigmoid diverticulosis.
Status post appendectomy.

Vascular/Lymphatic: Patent mid to distal right common iliac stent.
No abdominal aorta or iliac aneurysm. Mild atherosclerotic plaque of
the aorta and its branches. No abdominal, pelvic, or inguinal
lymphadenopathy.

Reproductive: Status post hysterectomy. No adnexal masses.

Other: No intraperitoneal free fluid. No intraperitoneal free gas.
No organized fluid collection.

Musculoskeletal:

No abdominal wall hernia or abnormality.

No suspicious lytic or blastic osseous lesions. No acute displaced
fracture. Dextroscoliosis of the thoracolumbar spine with associated
multilevel degenerative changes of the spine.
IMPRESSION: 1. Interval development of trace volume right pleural effusion.
2. Stable large hilar hernia containing the almost the entirety of
the stomach. No associated findings suggest associated ischemia or
bowel obstruction.
3. Diffuse sigmoid diverticulosis with no definite finding of acute
diverticulitis.
4. A 1 x 0.6 cm right middle lobe pulmonary nodule. Non-contrast
chest CT at 6-12 months is recommended. If the nodule is stable at
time of repeat CT, then future CT at 18-24 months (from today's
scan) is considered optional for low-risk patients, but is
recommended for high-risk patients. This recommendation follows the
consensus statement: Guidelines for Management of Incidental
Pulmonary Nodules Detected on CT Images: From the [HOSPITAL]
5. Aortic Atherosclerosis (B82BX-KWW.W). Right common iliac artery
stent patent.

## 2024-04-02 ENCOUNTER — Ambulatory Visit

## 2024-04-03 ENCOUNTER — Ambulatory Visit

## 2024-04-06 ENCOUNTER — Ambulatory Visit

## 2024-04-07 ENCOUNTER — Ambulatory Visit

## 2024-04-08 ENCOUNTER — Ambulatory Visit

## 2024-04-09 ENCOUNTER — Ambulatory Visit

## 2024-04-10 ENCOUNTER — Ambulatory Visit

## 2024-04-13 ENCOUNTER — Ambulatory Visit

## 2024-04-14 ENCOUNTER — Ambulatory Visit

## 2024-04-15 ENCOUNTER — Ambulatory Visit

## 2024-04-16 ENCOUNTER — Ambulatory Visit

## 2024-04-17 ENCOUNTER — Ambulatory Visit

## 2024-04-20 ENCOUNTER — Ambulatory Visit

## 2024-04-21 ENCOUNTER — Ambulatory Visit

## 2024-04-22 ENCOUNTER — Ambulatory Visit

## 2024-04-23 ENCOUNTER — Ambulatory Visit

## 2024-04-24 ENCOUNTER — Ambulatory Visit

## 2024-04-27 ENCOUNTER — Ambulatory Visit

## 2024-05-05 ENCOUNTER — Encounter

## 2024-05-06 ENCOUNTER — Encounter

## 2024-06-24 ENCOUNTER — Ambulatory Visit

## 2024-08-13 ENCOUNTER — Ambulatory Visit (INDEPENDENT_AMBULATORY_CARE_PROVIDER_SITE_OTHER): Admitting: Vascular Surgery

## 2024-08-13 ENCOUNTER — Encounter (INDEPENDENT_AMBULATORY_CARE_PROVIDER_SITE_OTHER)
